# Patient Record
Sex: Female | Born: 1945 | Race: Black or African American | Hispanic: No | Marital: Married | Smoking: Never smoker
Health system: Southern US, Community
[De-identification: ages and names within clinical notes are randomized; demographics above are authoritative.]

## PROBLEM LIST (undated history)

## (undated) DIAGNOSIS — M545 Low back pain, unspecified: Secondary | ICD-10-CM

## (undated) DIAGNOSIS — Z86718 Personal history of other venous thrombosis and embolism: Secondary | ICD-10-CM

## (undated) DIAGNOSIS — E559 Vitamin D deficiency, unspecified: Secondary | ICD-10-CM

## (undated) DIAGNOSIS — T7840XA Allergy, unspecified, initial encounter: Secondary | ICD-10-CM

## (undated) DIAGNOSIS — M5030 Other cervical disc degeneration, unspecified cervical region: Secondary | ICD-10-CM

## (undated) DIAGNOSIS — H269 Unspecified cataract: Secondary | ICD-10-CM

## (undated) DIAGNOSIS — K8020 Calculus of gallbladder without cholecystitis without obstruction: Secondary | ICD-10-CM

## (undated) DIAGNOSIS — E119 Type 2 diabetes mellitus without complications: Secondary | ICD-10-CM

## (undated) DIAGNOSIS — E7800 Pure hypercholesterolemia, unspecified: Secondary | ICD-10-CM

## (undated) DIAGNOSIS — E039 Hypothyroidism, unspecified: Secondary | ICD-10-CM

## (undated) DIAGNOSIS — H209 Unspecified iridocyclitis: Secondary | ICD-10-CM

## (undated) DIAGNOSIS — F411 Generalized anxiety disorder: Secondary | ICD-10-CM

## (undated) DIAGNOSIS — Z860100 Personal history of colon polyps, unspecified: Secondary | ICD-10-CM

## (undated) DIAGNOSIS — Z5189 Encounter for other specified aftercare: Secondary | ICD-10-CM

## (undated) DIAGNOSIS — I10 Essential (primary) hypertension: Secondary | ICD-10-CM

## (undated) DIAGNOSIS — I82409 Acute embolism and thrombosis of unspecified deep veins of unspecified lower extremity: Secondary | ICD-10-CM

## (undated) DIAGNOSIS — D126 Benign neoplasm of colon, unspecified: Secondary | ICD-10-CM

## (undated) DIAGNOSIS — K2100 Gastro-esophageal reflux disease with esophagitis, without bleeding: Secondary | ICD-10-CM

## (undated) DIAGNOSIS — I872 Venous insufficiency (chronic) (peripheral): Secondary | ICD-10-CM

## (undated) DIAGNOSIS — D500 Iron deficiency anemia secondary to blood loss (chronic): Secondary | ICD-10-CM

## (undated) DIAGNOSIS — K589 Irritable bowel syndrome without diarrhea: Secondary | ICD-10-CM

## (undated) DIAGNOSIS — D689 Coagulation defect, unspecified: Secondary | ICD-10-CM

## (undated) DIAGNOSIS — K5904 Chronic idiopathic constipation: Secondary | ICD-10-CM

## (undated) DIAGNOSIS — E538 Deficiency of other specified B group vitamins: Secondary | ICD-10-CM

## (undated) HISTORY — DX: Essential (primary) hypertension: I10

## (undated) HISTORY — DX: Allergy, unspecified, initial encounter: T78.40XA

## (undated) HISTORY — DX: Venous insufficiency (chronic) (peripheral): I87.2

## (undated) HISTORY — DX: Generalized anxiety disorder: F41.1

## (undated) HISTORY — DX: Pure hypercholesterolemia, unspecified: E78.00

## (undated) HISTORY — DX: Benign neoplasm of colon, unspecified: D12.6

## (undated) HISTORY — PX: FEMORAL HERNIA REPAIR: SHX632

## (undated) HISTORY — DX: Irritable bowel syndrome, unspecified: K58.9

## (undated) HISTORY — DX: Vitamin D deficiency, unspecified: E55.9

## (undated) HISTORY — DX: Acute embolism and thrombosis of unspecified deep veins of unspecified lower extremity: I82.409

## (undated) HISTORY — DX: Other cervical disc degeneration, unspecified cervical region: M50.30

## (undated) HISTORY — DX: Hypothyroidism, unspecified: E03.9

## (undated) HISTORY — DX: Unspecified iridocyclitis: H20.9

## (undated) HISTORY — DX: Iron deficiency anemia secondary to blood loss (chronic): D50.0

## (undated) HISTORY — DX: Type 2 diabetes mellitus without complications: E11.9

## (undated) HISTORY — DX: Personal history of colon polyps, unspecified: Z86.0100

## (undated) HISTORY — DX: Chronic idiopathic constipation: K59.04

## (undated) HISTORY — DX: Deficiency of other specified B group vitamins: E53.8

## (undated) HISTORY — DX: Low back pain, unspecified: M54.50

## (undated) HISTORY — DX: Coagulation defect, unspecified: D68.9

## (undated) HISTORY — PX: CARPAL TUNNEL RELEASE: SHX101

## (undated) HISTORY — DX: Unspecified cataract: H26.9

## (undated) HISTORY — DX: Encounter for other specified aftercare: Z51.89

## (undated) HISTORY — DX: Personal history of other venous thrombosis and embolism: Z86.718

## (undated) HISTORY — PX: VESICOVAGINAL FISTULA CLOSURE W/ TAH: SUR271

## (undated) HISTORY — PX: OOPHORECTOMY: SHX86

## (undated) HISTORY — PX: CATARACT EXTRACTION: SUR2

## (undated) HISTORY — DX: Gastro-esophageal reflux disease with esophagitis, without bleeding: K21.00

## (undated) HISTORY — DX: Gastro-esophageal reflux disease with esophagitis: K21.0

## (undated) HISTORY — DX: Low back pain: M54.5

## (undated) HISTORY — DX: Personal history of colonic polyps: Z86.010

## (undated) HISTORY — DX: Calculus of gallbladder without cholecystitis without obstruction: K80.20

---

## 1998-10-21 ENCOUNTER — Ambulatory Visit (HOSPITAL_BASED_OUTPATIENT_CLINIC_OR_DEPARTMENT_OTHER): Admission: RE | Admit: 1998-10-21 | Discharge: 1998-10-21 | Payer: Self-pay | Admitting: Surgery

## 1999-02-25 ENCOUNTER — Encounter: Admission: RE | Admit: 1999-02-25 | Discharge: 1999-02-25 | Payer: Self-pay | Admitting: Sports Medicine

## 1999-03-21 ENCOUNTER — Ambulatory Visit: Admission: RE | Admit: 1999-03-21 | Discharge: 1999-03-21 | Payer: Self-pay | Admitting: Pulmonary Disease

## 1999-03-28 ENCOUNTER — Encounter: Payer: Self-pay | Admitting: Sports Medicine

## 1999-03-28 ENCOUNTER — Ambulatory Visit (HOSPITAL_COMMUNITY): Admission: RE | Admit: 1999-03-28 | Discharge: 1999-03-28 | Payer: Self-pay | Admitting: Sports Medicine

## 1999-05-16 DIAGNOSIS — I82409 Acute embolism and thrombosis of unspecified deep veins of unspecified lower extremity: Secondary | ICD-10-CM

## 1999-05-16 HISTORY — DX: Acute embolism and thrombosis of unspecified deep veins of unspecified lower extremity: I82.409

## 1999-06-27 ENCOUNTER — Other Ambulatory Visit: Admission: RE | Admit: 1999-06-27 | Discharge: 1999-06-27 | Payer: Self-pay | Admitting: *Deleted

## 1999-07-13 ENCOUNTER — Inpatient Hospital Stay (HOSPITAL_COMMUNITY): Admission: EM | Admit: 1999-07-13 | Discharge: 1999-07-19 | Payer: Self-pay | Admitting: Emergency Medicine

## 1999-07-14 ENCOUNTER — Encounter: Payer: Self-pay | Admitting: Pulmonary Disease

## 1999-12-31 ENCOUNTER — Emergency Department (HOSPITAL_COMMUNITY): Admission: EM | Admit: 1999-12-31 | Discharge: 1999-12-31 | Payer: Self-pay | Admitting: Emergency Medicine

## 2000-02-10 ENCOUNTER — Ambulatory Visit: Admission: RE | Admit: 2000-02-10 | Discharge: 2000-02-10 | Payer: Self-pay | Admitting: Pulmonary Disease

## 2000-05-13 ENCOUNTER — Emergency Department (HOSPITAL_COMMUNITY): Admission: EM | Admit: 2000-05-13 | Discharge: 2000-05-14 | Payer: Self-pay | Admitting: Emergency Medicine

## 2000-07-12 ENCOUNTER — Encounter: Payer: Self-pay | Admitting: Pulmonary Disease

## 2000-07-12 ENCOUNTER — Ambulatory Visit (HOSPITAL_COMMUNITY): Admission: RE | Admit: 2000-07-12 | Discharge: 2000-07-12 | Payer: Self-pay | Admitting: Pulmonary Disease

## 2000-09-03 ENCOUNTER — Encounter: Admission: RE | Admit: 2000-09-03 | Discharge: 2000-09-03 | Payer: Self-pay | Admitting: Neurosurgery

## 2000-09-03 ENCOUNTER — Encounter: Payer: Self-pay | Admitting: Neurosurgery

## 2000-09-17 ENCOUNTER — Other Ambulatory Visit: Admission: RE | Admit: 2000-09-17 | Discharge: 2000-09-17 | Payer: Self-pay | Admitting: *Deleted

## 2000-12-17 ENCOUNTER — Emergency Department (HOSPITAL_COMMUNITY): Admission: EM | Admit: 2000-12-17 | Discharge: 2000-12-17 | Payer: Self-pay | Admitting: Emergency Medicine

## 2001-05-13 ENCOUNTER — Ambulatory Visit (HOSPITAL_COMMUNITY): Admission: RE | Admit: 2001-05-13 | Discharge: 2001-05-13 | Payer: Self-pay | Admitting: Pulmonary Disease

## 2001-05-13 ENCOUNTER — Encounter: Payer: Self-pay | Admitting: Pulmonary Disease

## 2001-05-27 ENCOUNTER — Encounter: Payer: Self-pay | Admitting: Pulmonary Disease

## 2001-05-27 ENCOUNTER — Ambulatory Visit (HOSPITAL_COMMUNITY): Admission: RE | Admit: 2001-05-27 | Discharge: 2001-05-27 | Payer: Self-pay | Admitting: Pulmonary Disease

## 2001-09-18 ENCOUNTER — Other Ambulatory Visit: Admission: RE | Admit: 2001-09-18 | Discharge: 2001-09-18 | Payer: Self-pay | Admitting: *Deleted

## 2001-10-31 ENCOUNTER — Encounter: Admission: RE | Admit: 2001-10-31 | Discharge: 2002-01-29 | Payer: Self-pay | Admitting: Pulmonary Disease

## 2002-02-04 ENCOUNTER — Encounter: Payer: Self-pay | Admitting: *Deleted

## 2002-02-10 ENCOUNTER — Encounter: Payer: Self-pay | Admitting: Emergency Medicine

## 2002-02-10 ENCOUNTER — Emergency Department (HOSPITAL_COMMUNITY): Admission: EM | Admit: 2002-02-10 | Discharge: 2002-02-10 | Payer: Self-pay | Admitting: Emergency Medicine

## 2002-02-12 ENCOUNTER — Encounter (INDEPENDENT_AMBULATORY_CARE_PROVIDER_SITE_OTHER): Payer: Self-pay | Admitting: Specialist

## 2002-02-13 ENCOUNTER — Inpatient Hospital Stay (HOSPITAL_COMMUNITY): Admission: RE | Admit: 2002-02-13 | Discharge: 2002-02-18 | Payer: Self-pay | Admitting: *Deleted

## 2002-02-17 ENCOUNTER — Encounter: Payer: Self-pay | Admitting: *Deleted

## 2002-04-02 ENCOUNTER — Encounter: Admission: RE | Admit: 2002-04-02 | Discharge: 2002-07-01 | Payer: Self-pay | Admitting: Pulmonary Disease

## 2002-08-12 ENCOUNTER — Encounter: Admission: RE | Admit: 2002-08-12 | Discharge: 2002-08-12 | Payer: Self-pay | Admitting: Sports Medicine

## 2002-08-12 ENCOUNTER — Encounter: Payer: Self-pay | Admitting: Sports Medicine

## 2002-09-13 DIAGNOSIS — D126 Benign neoplasm of colon, unspecified: Secondary | ICD-10-CM

## 2002-09-13 HISTORY — DX: Benign neoplasm of colon, unspecified: D12.6

## 2002-10-07 ENCOUNTER — Encounter: Admission: RE | Admit: 2002-10-07 | Discharge: 2003-01-05 | Payer: Self-pay | Admitting: Pulmonary Disease

## 2002-10-10 ENCOUNTER — Encounter: Payer: Self-pay | Admitting: Gastroenterology

## 2003-03-23 ENCOUNTER — Other Ambulatory Visit: Admission: RE | Admit: 2003-03-23 | Discharge: 2003-03-23 | Payer: Self-pay | Admitting: *Deleted

## 2003-06-21 ENCOUNTER — Emergency Department (HOSPITAL_COMMUNITY): Admission: EM | Admit: 2003-06-21 | Discharge: 2003-06-21 | Payer: Self-pay | Admitting: Emergency Medicine

## 2003-07-22 ENCOUNTER — Encounter: Admission: RE | Admit: 2003-07-22 | Discharge: 2003-10-20 | Payer: Self-pay | Admitting: Pulmonary Disease

## 2004-03-23 ENCOUNTER — Other Ambulatory Visit: Admission: RE | Admit: 2004-03-23 | Discharge: 2004-03-23 | Payer: Self-pay | Admitting: *Deleted

## 2004-04-01 ENCOUNTER — Ambulatory Visit: Payer: Self-pay | Admitting: Pulmonary Disease

## 2004-04-04 ENCOUNTER — Ambulatory Visit: Payer: Self-pay

## 2004-04-04 ENCOUNTER — Ambulatory Visit: Payer: Self-pay | Admitting: Pulmonary Disease

## 2004-04-04 ENCOUNTER — Ambulatory Visit: Payer: Self-pay | Admitting: Internal Medicine

## 2004-04-25 ENCOUNTER — Ambulatory Visit (HOSPITAL_COMMUNITY): Admission: RE | Admit: 2004-04-25 | Discharge: 2004-04-25 | Payer: Self-pay | Admitting: Ophthalmology

## 2004-05-27 ENCOUNTER — Ambulatory Visit: Payer: Self-pay | Admitting: Pulmonary Disease

## 2004-06-27 ENCOUNTER — Ambulatory Visit: Payer: Self-pay | Admitting: Pulmonary Disease

## 2004-06-30 ENCOUNTER — Encounter: Admission: RE | Admit: 2004-06-30 | Discharge: 2004-09-28 | Payer: Self-pay | Admitting: Pulmonary Disease

## 2004-10-25 ENCOUNTER — Ambulatory Visit: Payer: Self-pay | Admitting: Pulmonary Disease

## 2005-01-25 ENCOUNTER — Ambulatory Visit: Payer: Self-pay | Admitting: Pulmonary Disease

## 2005-05-22 ENCOUNTER — Other Ambulatory Visit: Admission: RE | Admit: 2005-05-22 | Discharge: 2005-05-22 | Payer: Self-pay | Admitting: *Deleted

## 2005-05-29 ENCOUNTER — Ambulatory Visit: Payer: Self-pay | Admitting: Pulmonary Disease

## 2005-08-31 ENCOUNTER — Ambulatory Visit: Payer: Self-pay | Admitting: Pulmonary Disease

## 2005-11-06 ENCOUNTER — Ambulatory Visit: Payer: Self-pay | Admitting: Pulmonary Disease

## 2005-11-09 ENCOUNTER — Ambulatory Visit: Payer: Self-pay

## 2005-11-27 ENCOUNTER — Ambulatory Visit: Payer: Self-pay | Admitting: Pulmonary Disease

## 2005-12-15 ENCOUNTER — Emergency Department (HOSPITAL_COMMUNITY): Admission: EM | Admit: 2005-12-15 | Discharge: 2005-12-15 | Payer: Self-pay | Admitting: Emergency Medicine

## 2005-12-19 ENCOUNTER — Ambulatory Visit: Payer: Self-pay | Admitting: Pulmonary Disease

## 2006-01-19 ENCOUNTER — Ambulatory Visit: Payer: Self-pay

## 2006-01-20 ENCOUNTER — Encounter: Admission: RE | Admit: 2006-01-20 | Discharge: 2006-01-20 | Payer: Self-pay | Admitting: Sports Medicine

## 2006-04-02 ENCOUNTER — Ambulatory Visit: Payer: Self-pay | Admitting: Pulmonary Disease

## 2006-08-07 ENCOUNTER — Encounter: Admission: RE | Admit: 2006-08-07 | Discharge: 2006-08-07 | Payer: Self-pay | Admitting: Neurosurgery

## 2006-10-01 ENCOUNTER — Ambulatory Visit: Payer: Self-pay | Admitting: Pulmonary Disease

## 2006-10-02 LAB — CONVERTED CEMR LAB
ALT: 25 units/L (ref 0–40)
AST: 25 units/L (ref 0–37)
Albumin: 4 g/dL (ref 3.5–5.2)
Alkaline Phosphatase: 81 units/L (ref 39–117)
BUN: 17 mg/dL (ref 6–23)
Basophils Absolute: 0.1 10*3/uL (ref 0.0–0.1)
Basophils Relative: 1.1 % — ABNORMAL HIGH (ref 0.0–1.0)
Bilirubin, Direct: 0.1 mg/dL (ref 0.0–0.3)
CO2: 28 meq/L (ref 19–32)
Calcium: 9.5 mg/dL (ref 8.4–10.5)
Chloride: 107 meq/L (ref 96–112)
Cholesterol: 217 mg/dL (ref 0–200)
Creatinine, Ser: 1.1 mg/dL (ref 0.4–1.2)
Direct LDL: 94.5 mg/dL
Eosinophils Absolute: 0.2 10*3/uL (ref 0.0–0.6)
Eosinophils Relative: 3.4 % (ref 0.0–5.0)
GFR calc Af Amer: 65 mL/min
GFR calc non Af Amer: 54 mL/min
Glucose, Bld: 146 mg/dL — ABNORMAL HIGH (ref 70–99)
HCT: 42.8 % (ref 36.0–46.0)
HDL: 45.3 mg/dL (ref >39.0)
Hemoglobin: 14.6 g/dL (ref 12.0–15.0)
Hgb A1c MFr Bld: 6 % (ref 4.6–6.0)
Lymphocytes Relative: 30.7 % (ref 12.0–46.0)
MCHC: 34.2 g/dL (ref 30.0–36.0)
MCV: 87.7 fL (ref 78.0–100.0)
Monocytes Absolute: 0.4 10*3/uL (ref 0.2–0.7)
Monocytes Relative: 6 % (ref 3.0–11.0)
Neutro Abs: 3.7 10*3/uL (ref 1.4–7.7)
Neutrophils Relative %: 58.8 % (ref 43.0–77.0)
Platelets: 258 10*3/uL (ref 150–400)
Potassium: 4.4 meq/L (ref 3.5–5.1)
RBC: 4.88 M/uL (ref 3.87–5.11)
RDW: 13.7 % (ref 11.5–14.6)
Sodium: 141 meq/L (ref 135–145)
TSH: 1.09 microintl units/mL (ref 0.35–5.50)
Total Bilirubin: 0.6 mg/dL (ref 0.3–1.2)
Total CHOL/HDL Ratio: 4.8
Total Protein: 7.2 g/dL (ref 6.0–8.3)
Triglycerides: 181 mg/dL — ABNORMAL HIGH (ref 0–149)
VLDL: 36 mg/dL (ref 0–40)
WBC: 6.3 10*3/uL (ref 4.5–10.5)

## 2006-11-05 ENCOUNTER — Ambulatory Visit: Payer: Self-pay | Admitting: Internal Medicine

## 2006-12-05 ENCOUNTER — Other Ambulatory Visit: Admission: RE | Admit: 2006-12-05 | Discharge: 2006-12-05 | Payer: Self-pay | Admitting: *Deleted

## 2007-01-11 ENCOUNTER — Ambulatory Visit: Payer: Self-pay | Admitting: Pulmonary Disease

## 2007-01-11 LAB — CONVERTED CEMR LAB
BUN: 10 mg/dL (ref 6–23)
CO2: 30 meq/L (ref 19–32)
Calcium: 9.5 mg/dL (ref 8.4–10.5)
Chloride: 104 meq/L (ref 96–112)
Creatinine, Ser: 1 mg/dL (ref 0.4–1.2)
GFR calc Af Amer: 72 mL/min
GFR calc non Af Amer: 60 mL/min
Glucose, Bld: 134 mg/dL — ABNORMAL HIGH (ref 70–99)
Hgb A1c MFr Bld: 5.6 % (ref 4.6–6.0)
Potassium: 5 meq/L (ref 3.5–5.1)
Sodium: 142 meq/L (ref 135–145)

## 2007-04-03 ENCOUNTER — Ambulatory Visit: Payer: Self-pay | Admitting: Pulmonary Disease

## 2007-04-03 DIAGNOSIS — L988 Other specified disorders of the skin and subcutaneous tissue: Secondary | ICD-10-CM | POA: Insufficient documentation

## 2007-04-03 DIAGNOSIS — N390 Urinary tract infection, site not specified: Secondary | ICD-10-CM | POA: Insufficient documentation

## 2007-04-03 DIAGNOSIS — K581 Irritable bowel syndrome with constipation: Secondary | ICD-10-CM

## 2007-04-03 DIAGNOSIS — G5600 Carpal tunnel syndrome, unspecified upper limb: Secondary | ICD-10-CM | POA: Insufficient documentation

## 2007-04-03 DIAGNOSIS — I10 Essential (primary) hypertension: Secondary | ICD-10-CM | POA: Insufficient documentation

## 2007-04-03 DIAGNOSIS — E039 Hypothyroidism, unspecified: Secondary | ICD-10-CM | POA: Insufficient documentation

## 2007-04-03 DIAGNOSIS — M5030 Other cervical disc degeneration, unspecified cervical region: Secondary | ICD-10-CM | POA: Insufficient documentation

## 2007-04-03 DIAGNOSIS — F40298 Other specified phobia: Secondary | ICD-10-CM | POA: Insufficient documentation

## 2007-04-03 DIAGNOSIS — K2100 Gastro-esophageal reflux disease with esophagitis, without bleeding: Secondary | ICD-10-CM | POA: Insufficient documentation

## 2007-04-03 DIAGNOSIS — I872 Venous insufficiency (chronic) (peripheral): Secondary | ICD-10-CM | POA: Insufficient documentation

## 2007-04-03 DIAGNOSIS — R209 Unspecified disturbances of skin sensation: Secondary | ICD-10-CM | POA: Insufficient documentation

## 2007-04-03 DIAGNOSIS — F411 Generalized anxiety disorder: Secondary | ICD-10-CM | POA: Insufficient documentation

## 2007-04-03 DIAGNOSIS — M545 Low back pain, unspecified: Secondary | ICD-10-CM | POA: Insufficient documentation

## 2007-04-03 DIAGNOSIS — H209 Unspecified iridocyclitis: Secondary | ICD-10-CM | POA: Insufficient documentation

## 2007-04-03 DIAGNOSIS — D500 Iron deficiency anemia secondary to blood loss (chronic): Secondary | ICD-10-CM | POA: Insufficient documentation

## 2007-04-03 DIAGNOSIS — K5900 Constipation, unspecified: Secondary | ICD-10-CM | POA: Insufficient documentation

## 2007-04-03 DIAGNOSIS — G932 Benign intracranial hypertension: Secondary | ICD-10-CM | POA: Insufficient documentation

## 2007-04-03 DIAGNOSIS — E538 Deficiency of other specified B group vitamins: Secondary | ICD-10-CM | POA: Insufficient documentation

## 2007-04-03 DIAGNOSIS — Z86718 Personal history of other venous thrombosis and embolism: Secondary | ICD-10-CM | POA: Insufficient documentation

## 2007-04-08 ENCOUNTER — Telehealth (INDEPENDENT_AMBULATORY_CARE_PROVIDER_SITE_OTHER): Payer: Self-pay | Admitting: *Deleted

## 2007-04-17 ENCOUNTER — Encounter: Payer: Self-pay | Admitting: Pulmonary Disease

## 2007-07-04 ENCOUNTER — Ambulatory Visit: Payer: Self-pay | Admitting: Pulmonary Disease

## 2007-07-06 LAB — CONVERTED CEMR LAB
ALT: 32 units/L (ref 0–35)
AST: 32 units/L (ref 0–37)
Albumin: 3.9 g/dL (ref 3.5–5.2)
Alkaline Phosphatase: 91 units/L (ref 39–117)
BUN: 16 mg/dL (ref 6–23)
Basophils Absolute: 0 10*3/uL (ref 0.0–0.1)
Basophils Relative: 0.7 % (ref 0.0–1.0)
Bilirubin, Direct: 0.1 mg/dL (ref 0.0–0.3)
CO2: 28 meq/L (ref 19–32)
Calcium: 9.5 mg/dL (ref 8.4–10.5)
Chloride: 99 meq/L (ref 96–112)
Cholesterol: 231 mg/dL (ref 0–200)
Creatinine, Ser: 1.1 mg/dL (ref 0.4–1.2)
Direct LDL: 105.9 mg/dL
Eosinophils Absolute: 0.2 10*3/uL (ref 0.0–0.6)
Eosinophils Relative: 4.1 % (ref 0.0–5.0)
GFR calc Af Amer: 65 mL/min
GFR calc non Af Amer: 53 mL/min
Glucose, Bld: 203 mg/dL — ABNORMAL HIGH (ref 70–99)
HCT: 43.3 % (ref 36.0–46.0)
HDL: 46.8 mg/dL (ref >39.0)
Hemoglobin: 14.5 g/dL (ref 12.0–15.0)
Hgb A1c MFr Bld: 6.9 % — ABNORMAL HIGH (ref 4.6–6.0)
Iron: 77 ug/dL (ref 42–145)
Lymphocytes Relative: 34.6 % (ref 12.0–46.0)
MCHC: 33.6 g/dL (ref 30.0–36.0)
MCV: 86.9 fL (ref 78.0–100.0)
Monocytes Absolute: 0.6 10*3/uL (ref 0.2–0.7)
Monocytes Relative: 10.6 % (ref 3.0–11.0)
Neutro Abs: 2.7 10*3/uL (ref 1.4–7.7)
Neutrophils Relative %: 50 % (ref 43.0–77.0)
Platelets: 252 10*3/uL (ref 150–400)
Potassium: 5.1 meq/L (ref 3.5–5.1)
RBC: 4.98 M/uL (ref 3.87–5.11)
RDW: 13.3 % (ref 11.5–14.6)
Sodium: 138 meq/L (ref 135–145)
TSH: 1.9 microintl units/mL (ref 0.35–5.50)
Total Bilirubin: 0.9 mg/dL (ref 0.3–1.2)
Total CHOL/HDL Ratio: 4.9
Total Protein: 7.3 g/dL (ref 6.0–8.3)
Transferrin: 273.7 mg/dL (ref >=212.0)
Triglycerides: 138 mg/dL (ref 0–149)
VLDL: 28 mg/dL (ref 0–40)
Vit D, 1,25-Dihydroxy: 14 — ABNORMAL LOW (ref 30–89)
Vitamin B-12: 599 pg/mL (ref 211–911)
WBC: 5.4 10*3/uL (ref 4.5–10.5)

## 2007-07-08 ENCOUNTER — Encounter (INDEPENDENT_AMBULATORY_CARE_PROVIDER_SITE_OTHER): Payer: Self-pay | Admitting: *Deleted

## 2007-07-09 ENCOUNTER — Telehealth (INDEPENDENT_AMBULATORY_CARE_PROVIDER_SITE_OTHER): Payer: Self-pay | Admitting: *Deleted

## 2007-07-09 ENCOUNTER — Ambulatory Visit: Payer: Self-pay | Admitting: Internal Medicine

## 2007-07-26 ENCOUNTER — Encounter: Payer: Self-pay | Admitting: Pulmonary Disease

## 2007-08-15 ENCOUNTER — Telehealth (INDEPENDENT_AMBULATORY_CARE_PROVIDER_SITE_OTHER): Payer: Self-pay | Admitting: *Deleted

## 2007-10-24 ENCOUNTER — Encounter: Payer: Self-pay | Admitting: Pulmonary Disease

## 2007-11-04 ENCOUNTER — Ambulatory Visit: Payer: Self-pay | Admitting: Pulmonary Disease

## 2007-11-16 DIAGNOSIS — E785 Hyperlipidemia, unspecified: Secondary | ICD-10-CM | POA: Insufficient documentation

## 2007-12-30 ENCOUNTER — Encounter: Payer: Self-pay | Admitting: Pulmonary Disease

## 2008-01-15 ENCOUNTER — Encounter: Payer: Self-pay | Admitting: Pulmonary Disease

## 2008-03-05 ENCOUNTER — Ambulatory Visit: Payer: Self-pay | Admitting: Pulmonary Disease

## 2008-03-05 DIAGNOSIS — E559 Vitamin D deficiency, unspecified: Secondary | ICD-10-CM | POA: Insufficient documentation

## 2008-03-05 LAB — CONVERTED CEMR LAB: Vit D, 1,25-Dihydroxy: 65 (ref 30–89)

## 2008-03-07 LAB — CONVERTED CEMR LAB
BUN: 16 mg/dL (ref 6–23)
CO2: 31 meq/L (ref 19–32)
Calcium: 9.9 mg/dL (ref 8.4–10.5)
Chloride: 104 meq/L (ref 96–112)
Creatinine, Ser: 1.1 mg/dL (ref 0.4–1.2)
GFR calc Af Amer: 65 mL/min
GFR calc non Af Amer: 53 mL/min
Glucose, Bld: 97 mg/dL (ref 70–99)
Potassium: 4.9 meq/L (ref 3.5–5.1)
Sodium: 143 meq/L (ref 135–145)
Uric Acid, Serum: 7.4 mg/dL — ABNORMAL HIGH (ref 2.4–7.0)

## 2008-04-02 DIAGNOSIS — Z860100 Personal history of colon polyps, unspecified: Secondary | ICD-10-CM | POA: Insufficient documentation

## 2008-04-06 ENCOUNTER — Telehealth: Payer: Self-pay | Admitting: Pulmonary Disease

## 2008-04-06 ENCOUNTER — Ambulatory Visit: Payer: Self-pay | Admitting: Gastroenterology

## 2008-04-13 ENCOUNTER — Encounter: Payer: Self-pay | Admitting: Pulmonary Disease

## 2008-05-13 ENCOUNTER — Ambulatory Visit: Payer: Self-pay | Admitting: Gastroenterology

## 2008-05-20 ENCOUNTER — Ambulatory Visit: Payer: Self-pay | Admitting: Gastroenterology

## 2008-05-20 ENCOUNTER — Encounter: Payer: Self-pay | Admitting: Gastroenterology

## 2008-05-21 ENCOUNTER — Encounter: Payer: Self-pay | Admitting: Gastroenterology

## 2008-07-09 ENCOUNTER — Encounter: Payer: Self-pay | Admitting: Pulmonary Disease

## 2008-09-01 ENCOUNTER — Ambulatory Visit: Payer: Self-pay | Admitting: Pulmonary Disease

## 2008-09-04 ENCOUNTER — Telehealth (INDEPENDENT_AMBULATORY_CARE_PROVIDER_SITE_OTHER): Payer: Self-pay | Admitting: *Deleted

## 2008-09-10 ENCOUNTER — Encounter: Payer: Self-pay | Admitting: Pulmonary Disease

## 2008-11-19 ENCOUNTER — Encounter: Payer: Self-pay | Admitting: Pulmonary Disease

## 2009-01-14 ENCOUNTER — Encounter: Payer: Self-pay | Admitting: Pulmonary Disease

## 2009-02-16 ENCOUNTER — Encounter: Payer: Self-pay | Admitting: Pulmonary Disease

## 2009-02-23 ENCOUNTER — Encounter: Payer: Self-pay | Admitting: Pulmonary Disease

## 2009-03-01 ENCOUNTER — Ambulatory Visit: Payer: Self-pay | Admitting: Pulmonary Disease

## 2009-04-16 ENCOUNTER — Telehealth: Payer: Self-pay | Admitting: Pulmonary Disease

## 2009-06-23 ENCOUNTER — Encounter: Payer: Self-pay | Admitting: Pulmonary Disease

## 2009-07-19 ENCOUNTER — Encounter: Payer: Self-pay | Admitting: Pulmonary Disease

## 2009-08-03 ENCOUNTER — Encounter: Payer: Self-pay | Admitting: Pulmonary Disease

## 2009-10-05 ENCOUNTER — Encounter: Payer: Self-pay | Admitting: Pulmonary Disease

## 2009-10-06 ENCOUNTER — Telehealth: Payer: Self-pay | Admitting: Pulmonary Disease

## 2009-10-07 ENCOUNTER — Telehealth: Payer: Self-pay | Admitting: Pulmonary Disease

## 2009-10-07 ENCOUNTER — Telehealth (INDEPENDENT_AMBULATORY_CARE_PROVIDER_SITE_OTHER): Payer: Self-pay | Admitting: *Deleted

## 2009-10-07 ENCOUNTER — Encounter: Admission: RE | Admit: 2009-10-07 | Discharge: 2009-10-07 | Payer: Self-pay | Admitting: Pulmonary Disease

## 2009-10-12 ENCOUNTER — Ambulatory Visit: Payer: Self-pay | Admitting: Pulmonary Disease

## 2009-12-31 ENCOUNTER — Telehealth (INDEPENDENT_AMBULATORY_CARE_PROVIDER_SITE_OTHER): Payer: Self-pay | Admitting: *Deleted

## 2010-01-06 ENCOUNTER — Encounter: Payer: Self-pay | Admitting: Pulmonary Disease

## 2010-02-07 ENCOUNTER — Encounter: Payer: Self-pay | Admitting: Pulmonary Disease

## 2010-02-17 ENCOUNTER — Encounter: Payer: Self-pay | Admitting: Pulmonary Disease

## 2010-02-23 ENCOUNTER — Encounter: Payer: Self-pay | Admitting: Pulmonary Disease

## 2010-04-04 ENCOUNTER — Telehealth (INDEPENDENT_AMBULATORY_CARE_PROVIDER_SITE_OTHER): Payer: Self-pay | Admitting: *Deleted

## 2010-04-11 ENCOUNTER — Ambulatory Visit: Payer: Self-pay | Admitting: Pulmonary Disease

## 2010-04-28 ENCOUNTER — Telehealth: Payer: Self-pay | Admitting: Pulmonary Disease

## 2010-05-03 ENCOUNTER — Telehealth (INDEPENDENT_AMBULATORY_CARE_PROVIDER_SITE_OTHER): Payer: Self-pay | Admitting: *Deleted

## 2010-05-11 ENCOUNTER — Encounter: Payer: Self-pay | Admitting: Pulmonary Disease

## 2010-06-16 NOTE — Consult Note (Signed)
Summary: [facility] Endocrinology & Diabetes  [facility] Endocrinology & Diabetes   Imported By: Laural Benes 01/28/2008 15:05:00  _____________________________________________________________________  External Attachment:    Type:   Image     Comment:   External Document

## 2010-06-16 NOTE — Letter (Signed)
Summary: [facility]  [facility]   Imported By: Phillis Knack 02/02/2009 07:40:42  _____________________________________________________________________  External Attachment:    Type:   Image     Comment:   External Document

## 2010-06-16 NOTE — Progress Notes (Signed)
Summary: alprazolam rx  Phone Note Call from Patient   Caller: Patient Call For: nadel Summary of Call: [facility] [address] [facility]" to keep her refill rx for alprazolam on file but says they don't have this. pt cell [phone] Initial call taken by: Cooper Render, CNA,  May 03, 2010 11:19 AM  Follow-up for Phone Call        Spoke with pt and confirmed msg.  She is needing refill on alprazolam, pls advise if this is okay, thanks Tilden Dome  May 03, 2010 2:01 PM  Follow-up by: Tilden Dome,  May 03, 2010 2:01 PM  Additional Follow-up for Phone Call Additional follow up Details #1::        per SN----ok for pt to have alprazolam 0.5 mg   #90   take 1/2 to 1 tablet by mouth three times a day as needed for nerves refills x 5.  thanks [place]  May 03, 2010 2:44 PM   [facility] [address] -- pt aware Additional Follow-up by: Raymondo Band RN,  May 03, 2010 3:04 PM    Prescriptions: ALPRAZOLAM 0.5 MG  TABS (ALPRAZOLAM) take 1/2 to 1 tab by mouth three times a day as needed for nerves...  #90 x 5   Entered by:   Raymondo Band RN   Authorized by:   Noralee Space MD   Signed by:   Raymondo Band RN on 05/03/2010   Method used:   Telephoned to ...       [facility]  [address] [id number]ail[facility]       [place]       [place], [place]  [address]       Ph: [phone] or [phone]       Fax: [phone]   RxID:   [id number]

## 2010-06-16 NOTE — Letter (Signed)
Summary: [facility] Endocrinology & Diabetes  [facility] Endocrinology [facility] Diabetes   Imported By: Phillis Knack 11/01/2009 14:03:34  _____________________________________________________________________  External Attachment:    Type:   Image     Comment:   External Document

## 2010-06-16 NOTE — Progress Notes (Signed)
Summary: MED ?  Phone Note Call from Patient   Caller: Patient Call For: nadel Summary of Call: DR ALTHEIMER WOULD LIKE TO KNOW  WHY SHE NEED AMLODIPINE AND DILTIAZEM ER TOGETHER HIS NUMBER IS [phone] Initial call taken by: Gustavus Bryant,  April 06, 2008 2:37 PM  Follow-up for Phone Call        called and spoke with pt...per SN---at her last ov they discussed this and she was to start weaning herself down off of the diltiazem and she was to continue with the amlodipine.  she will stop the diltiazem and continue with the amlodipine. Ramiro Harvest CMA  April 06, 2008 5:48 PM

## 2010-06-16 NOTE — Letter (Signed)
Summary: [facility] Endocrinology & Diabetes  [facility] Endocrinology & Diabetes   Imported By: Phillis Knack 07/03/2009 08:43:38  _____________________________________________________________________  External Attachment:    Type:   Image     Comment:   External Document

## 2010-06-16 NOTE — Miscellaneous (Signed)
Summary: BONE DENSITY  Clinical Lists Changes  Orders: Added new Test order of T-Lumbar Vertebral Assessment (77082) - Signed 

## 2010-06-16 NOTE — Assessment & Plan Note (Signed)
Summary: FOLLOW UP/ MBW   Chief Complaint:  3 month ROV....  History of Present Illness: 65 y/o BF here for a follow up visit... she has a problem list w/ 21 problems listed- she doesn't feel well and states "I'm a worrier"... ie concerned about her DM, her weight, & her abd panniculus...   Current Problems:  ***ESSENTIAL HYPERTENSION - controlled on CARDIZEM 180mg /d, NORVASC 10mg /d, LISINOPRIL 20mg /d, & LASIX 20mg /d... not checking BP's at home [facility], and BP=118/68 today... she denies HA, fatigue, visual changes, CP, palipit, dizziness, syncope, dyspnea, edema, etc...  Hx of DEEP VENOUS THROMBOPHLEBITIS, VENOUS INSUFFICIENCY  ***DIABETES MELLITUS, TYPE II (ICD-250.00) - on METFORMIN ER 500mg /d and she notes BS's sl increased at home... ?on diet and weight is down 6# to 216# today... not exercising.& we discussed this... prev BS=134, HgA1c=5.6 (8/08 on Glucovance 250/1.25 qAM)... due for f/u labs today.  ***HYPOTHYROIDISM (ICD-244.9) - on LEVOTHYROID 75mcg/d... TSH 7/08 norm at 2.5.Marland KitchenMarland Kitchen  REFLUX ESOPHAGITIS, IRRITABLE BOWEL SYNDROME, COLONIC POLYPS, ADENOMATOUS  Hx of IRON DEFICIENCY ANEMIA SECONDARY TO BLOOD LOSS, VITAMIN B12 DEFICIENCY   UTI'S, RECURRENT (ICD-599.0)  DEGENERATIVE DISC DISEASE, CERVICAL SPINE; LOW BACK PAIN, CHRONIC; HYPERURICEMIA;  Hx of CARPAL TUNNEL SYNDROME, BILATERAL  Hx of PSEUDOTUMOR CEREBRI & PARESTHESIA   ANXIETY, OTHER ISOLATED OR SPECIFIC PHOBIAS  OTHER SPECIFIED DISORDER OF SKIN (ICD-709.8)  IRITIS (ICD-364.3)       Current Allergies (reviewed today): ! SULFAMETHOXAZOLE-TRIMETHOPRIM (SULFAMETHOXAZOLE-TRIMETHOPRIM) ! PENICILLIN V POTASSIUM (PENICILLIN V POTASSIUM) ! TOPROL XL (METOPROLOL SUCCINATE)  Past Medical History:         1. ESSENTIAL HYPERTENSION - Baseline CXR w/ nl heart & clear lungs;  EKG shows NSR & NSSTTWA;  2D ECHO 3/01 was nl;  well controlled on meds.       2. Hx of DEEP VENOUS THROMBOPHLEBITIS - Hx DVT &  small PE 2/01 - Rx w/ Coumadin til 2006, then dc'd;  Neg venous dopplers 11/05 & 6/07.       3. VENOUS INSUFFICIENCY              4. DIABETES MELLITUS, TYPE II - Good control on Metformin ER 500 (one daily) w/ BS 134 & HgA1C 5.6 (8/08).       5. HYPOTHYROIDISM              6. REFLUX ESOPHAGITIS        7. IRRITABLE BOWEL SYNDROME        8. COLONIC POLYPS, ADENOMATOUS - Last colonoscopy 5/04 by DrStark - adenomatous polyp removed, +hem, f/u 5 yrs.       9. Hx of IRON DEFICIENCY ANEMIA SECONDARY TO BLOOD LOSS        10. VITAMIN B12 DEFICIENCY        11. UTI'S, RECURRENT               12. DEGENERATIVE DISC DISEASE, CERVICAL SPINE - Eval & Rx by [facility] 3/02 w/ multilevel DDD - C4-5 C5-6 C6-7;  conservative Rx & improved.       13. LOW BACK PAIN, CHRONIC - Eval & Rx by DrKramer & Joie Bimler 9/07 - L2 compression & pelvic fx after fall; subseq Rx w/ shots for Lumbar spondy & improved.       14. HYPERURICEMIA       15. Hx of CARPAL TUNNEL SYNDROME, BILATERAL - Eval & Rx by [facility] & DrSypher in 2001 w/ wrist splints & improved.  16. Hx of PSEUDOTUMOR CEREBRI - Eval & Rx by DrBrewington/Tyson & DrLove 1984 - treated w/ Diamox (headaches resolved).       17. PARESTHESIA -  mult somatic complaints - Rx attempt w/ Cymbalta (no change).       18. ANXIETY        19. OTHER ISOLATED OR SPECIFIC PHOBIAS - She has a "pill phobia" & unable to swallow some pills.       20. OTHER SPECIFIED DISORDER OF SKIN - val DrDrewJones w/ "lichenoid dermatitis" on skin Bx 5/06 - poss related to Toprol Rx.       21. IRITIS - Eval DrTyson 12/05 was neg.     Review of Systems  The patient denies anorexia, fever, weight loss, weight gain, vision loss, decreased hearing, hoarseness, chest pain, syncope, peripheral edema, prolonged cough, hemoptysis, abdominal pain, melena, hematochezia, severe indigestion/heartburn, hematuria, incontinence, muscle weakness, suspicious skin lesions, transient blindness,  difficulty walking, depression, unusual weight change, abnormal bleeding, enlarged lymph nodes, and angioedema.     Vital Signs:  Patient Profile:   65 Years Old Female Weight:      216.2 pounds O2 Sat:      94 % O2 treatment:    Room Air Temp:     97.2 degrees F oral Pulse rate:   111 / minute BP sitting:   118 / 68  (left arm)  Vitals Entered By: Ramiro Harvest CMA (July 04, 2007 9:37 AM)                 Physical Exam  WD, WN, 65 y/o BF in NAD... GENERAL:  Alert & oriented; pleasant & cooperative. HEENT:  [place]/AT, EOM-wnl, PERRLA, EACs-clear, TMs-wnl, NOSE-clear, THROAT-clear & wnl. NECK:  Supple w/ fair ROM; no JVD; normal carotid impulses w/o bruits; no thyromegaly or nodules palpated; no lymphadenopathy. CHEST:  Clear to P & A; without wheezes/ rales/ or rhonchi. HEART:  Regular Rhythm; gr 1/6 SEM without rubs or gallops heard... ABDOMEN:  Obese, soft & nontender; + abd panniculus, normal bowel sounds; no organomegaly or masses detected. EXT: without deformities, mild arthritic changes; no varicose veins/ +venous insuffic/ no edema. NEURO:  CN's intact; no focal neuro deficits... DERM:  No lesions noted; no rash etc...       Impression & Recommendations:  Problem # 1:  ESSENTIAL HYPERTENSION (ICD-401.9) Assessment: Unchanged BP stable... continue same meds... due for follow up labs--- Her updated medication list for this problem includes:    Cardizem Cd 180 Mg Cp24 (Diltiazem hcl coated beads) .Marland Kitchen... 1 tab daily    Amlodipine Besylate 10 Mg Tabs (Amlodipine besylate) .Marland Kitchen... 1 tab daily    Lisinopril 20 Mg Tabs (Lisinopril) .Marland Kitchen... 1 tab daily    Furosemide 20 Mg Tabs (Furosemide) .Marland Kitchen... 1 tab in am  Orders: Venipuncture [id number]) T-Vitamin D (25-Hydroxy) [phone]) 12 Lead EKG (12 Lead EKG) TLB-Lipid Panel (80061-LIPID) TLB-BMP (Basic Metabolic Panel-BMET) (99991111) TLB-CBC Platelet - w/Differential (85025-CBCD) TLB-Hepatic/Liver Function Pnl  (80076-HEPATIC) TLB-TSH (Thyroid Stimulating Hormone) (84443-TSH) TLB-A1C / Hgb A1C (Glycohemoglobin) (83036-A1C) TLB-Iron, (Fe) Total (83540-FE) TLB-Transferrin (84466-TRNSF) TLB-B12, Serum-Total ONLY [id number])   Problem # 2:  DIABETES MELLITUS, TYPE II (ICD-250.00) Assessment: Unchanged Needs better diet and exercise program... continue same meds... check labs--- Her updated medication list for this problem includes:    Ecotrin Low Strength 81 Mg Tbec (Aspirin) .Marland Kitchen... 1 tab daily    Lisinopril 20 Mg Tabs (Lisinopril) .Marland Kitchen... 1 tab daily    Glucophage Xr 500 Mg Tb24 (Metformin hcl) .Marland KitchenMarland KitchenMarland KitchenMarland Kitchen  1 tab in am (generic)  Problem # 3:  HYPOTHYROIDISM (ICD-244.9) Assessment: Unchanged Continue SYNTHROID... follow up lab--- Her updated medication list for this problem includes:    Levothyroxine Sodium 75 Mcg Tabs (Levothyroxine sodium) .Marland Kitchen... 1 tab daily  Problem # 4:  REFLUX ESOPHAGITIS (ICD-530.11) Assessment: Unchanged GI stable... continue same meds.  Problem # 5:  Hx of IRON DEFICIENCY ANEMIA SECONDARY TO BLOOD LOSS (ICD-280.0) Assessment: Unchanged Follow up anemia labs--- Her updated medication list for this problem includes:    Cyanocobalamin 1000 Mcg/ml Inj Soln (Cyanocobalamin) .Marland KitchenMarland KitchenMarland KitchenMarland Kitchen 1000 micrograms subcutaneously qmonth  Problem # 6:  ANXIETY (ICD-300.00) Assessment: Unchanged We discussed her anxiety and fears... continue alprazolam Rx. Her updated medication list for this problem includes:    Alprazolam 0.5 Mg Tabs (Alprazolam) .Marland Kitchen... 1 tab three times a day prn   Complete Medication List: 1)  Ecotrin Low Strength 81 Mg Tbec (Aspirin) .Marland Kitchen.. 1 tab daily 2)  Cardizem Cd 180 Mg Cp24 (Diltiazem hcl coated beads) .Marland Kitchen.. 1 tab daily 3)  Amlodipine Besylate 10 Mg Tabs (Amlodipine besylate) .Marland Kitchen.. 1 tab daily 4)  Lisinopril 20 Mg Tabs (Lisinopril) .Marland Kitchen.. 1 tab daily 5)  Furosemide 20 Mg Tabs (Furosemide) .Marland Kitchen.. 1 tab in am 6)  Glucophage Xr 500 Mg Tb24 (Metformin hcl) .Marland Kitchen.. 1 tab in am  (generic) 7)  Levothyroxine Sodium 75 Mcg Tabs (Levothyroxine sodium) .Marland Kitchen.. 1 tab daily 8)  Allopurinol 300 Mg Tabs (Allopurinol) .Marland Kitchen.. 1 tab daily 9)  Alprazolam 0.5 Mg Tabs (Alprazolam) .Marland Kitchen.. 1 tab three times a day prn 10)  Cyanocobalamin 1000 Mcg/ml Inj Soln (Cyanocobalamin) .Marland Kitchen.. 1000 micrograms subcutaneously qmonth   Patient Instructions: 1)  Today we rechecked your fasting blood work... please call the "phone tree" in a few days for your lab results.Marland KitchenMarland Kitchen 2)  Call for any problems.Marland KitchenMarland Kitchen 3)  Please schedule a follow-up appointment in 4-6 months.    ]

## 2010-06-16 NOTE — Assessment & Plan Note (Signed)
Summary: consult b4 col ins dep diab.Marland Kitchenem   History of Present Illness Visit Type: consult Primary GI MD: Joylene Igo MD [facility] Primary Maurita Havener: Teressa Lower MD Requesting Calvin Chura: Teressa Lower MD Chief Complaint: h/o colon polyps, consult before colon, diabetic  History of Present Illness:   This is a 65 year old female that I have seen previously for adenomatous colon polyps. Initial diagnosis of adenomatous colon polyps was made in May 2004. He has no colorectal complaints except for occasional mild constipation. She has stable diabetes mellitus hypertension and hyperlipidemia managed by Dr. Lenna Gilford.   GI Review of Systems      Denies abdominal pain, acid reflux, belching, bloating, chest pain, dysphagia with liquids, dysphagia with solids, heartburn, loss of appetite, nausea, vomiting, vomiting blood, weight loss, and  weight gain.      Reports constipation.     Denies anal fissure, black tarry stools, change in bowel habit, diarrhea, diverticulosis, fecal incontinence, heme positive stool, hemorrhoids, irritable bowel syndrome, jaundice, light color stool, liver problems, rectal bleeding, and  rectal pain.    Prior Medications Reviewed Using: List Brought by Patient  Updated Prior Medication List: ECOTRIN LOW STRENGTH 81 MG  TBEC (ASPIRIN) 1 tab daily CARDIZEM CD 180 MG  CP24 (DILTIAZEM HCL COATED BEADS) 1 tab daily AMLODIPINE BESYLATE 10 MG  TABS (AMLODIPINE BESYLATE) 1 tab daily LISINOPRIL 20 MG  TABS (LISINOPRIL) 1 tab daily FUROSEMIDE 20 MG  TABS (FUROSEMIDE) 1 tab in AM CRESTOR 20 MG TABS (ROSUVASTATIN CALCIUM) take 1 tab by mouth once daily... METFORMIN HCL 500 MG  TABS (METFORMIN HCL) take 1 tab by mouth two times a day BYETTA 10 MCG PEN 10 MCG/0.04ML SOLN (EXENATIDE) inject 43mcg two times a day as directed... SYNTHROID 88 MCG  TABS (LEVOTHYROXINE SODIUM) Take 1 tablet by mouth once a day ALLOPURINOL 300 MG  TABS (ALLOPURINOL) 1 tab daily ALPRAZOLAM 0.5 MG  TABS  (ALPRAZOLAM) 1 tab three times a day prn CYANOCOBALAMIN 1000 MCG/ML INJ SOLN (CYANOCOBALAMIN) 1000 micrograms subcutaneously qmonth VITAMIN D 13086 UNIT  CAPS (ERGOCALCIFEROL) 1 by mouth every week  Current Allergies (reviewed today): ! PENICILLIN V POTASSIUM (PENICILLIN V POTASSIUM) ! TOPROL XL (METOPROLOL SUCCINATE)  Past Medical History:    ESSENTIAL HYPERTENSION (ICD-401.9)    Hx of DEEP VENOUS THROMBOPHLEBITIS (ICD-453.40)    VENOUS INSUFFICIENCY (ICD-459.81)    HYPERCHOLESTEROLEMIA (ICD-272.0)    DIABETES MELLITUS, TYPE II (ICD-250.00)    HYPOTHYROIDISM (ICD-244.9)    IRRITABLE BOWEL SYNDROME (ICD-564.1)    COLONIC POLYPS, ADENOMATOUS (ICD-211.3)    Hx of IRON DEFICIENCY ANEMIA SECONDARY TO BLOOD LOSS (ICD-280.0)    VITAMIN B12 DEFICIENCY (ICD-266.2)    UTI'S, RECURRENT (ICD-599.0)    DEGENERATIVE DISC DISEASE, CERVICAL SPINE (ICD-722.4)    LOW BACK PAIN, CHRONIC (ICD-724.2)    HYPERURICEMIA (ICD-790.6)    Hx of CARPAL TUNNEL SYNDROME, BILATERAL (ICD-354.0)    Hx of PSEUDOTUMOR CEREBRI (ICD-348.2)    PARESTHESIA (ICD-782.0)    ANXIETY (ICD-300.00)    OTHER ISOLATED OR SPECIFIC PHOBIAS (ICD-300.29)    OTHER SPECIFIED DISORDER OF SKIN (ICD-709.8)    IRITIS (ICD-364.3)    GERD HEMORRHOIDS  Past Surgical History:    Reviewed history from 03/05/2008 and no changes required:       Hysterectomy - yrs ago       Oophorectomy - S/P Rt Oopherectomy by DrFore 9/03 (Ovarian Cyst + adhesions)       Herniorrhaphy - Abd wall hernia repaired by [facility] 9/03       Anal  Fissure surg - 1977 by DrMauriceLeBauer  Family History:    Reviewed history from 11/04/2007 and no changes required:       mother died age 72       father died age 58 from prostate cancer       1 sister--alive age 67  Social History:    Reviewed history from 11/04/2007 and no changes required:       records specialist       married       no children       non smoker       no etoh   Review of Systems        The pertinent positives and negatives are noted as above and in the HPI. All other ROS were negative.   Vital Signs:  Patient Profile:   65 Years Old Female Height:     66 inches Weight:      207.13 pounds BMI:     33.55 Pulse rate:   88 / minute Pulse rhythm:   regular BP sitting:   110 / 62  (left arm)  Vitals Entered By: Christian Mate CMA (April 06, 2008 2:02 PM)                  Physical Exam  General:     Well developed, well nourished, no acute distress. obese.   Head:     Normocephalic and atraumatic. Eyes:     PERRLA, no icterus. Ears:     Normal auditory acuity. Mouth:     No deformity or lesions, dentition normal. Neck:     Supple; no masses or thyromegaly. Chest Wall:     Symmetrical;  no deformities or tenderness. Lungs:     Clear throughout to auscultation. Heart:     Regular rate and rhythm; no murmurs, rubs,  or bruits. Abdomen:     Soft, nontender and nondistended. No masses, hepatosplenomegaly or hernias noted. Normal bowel sounds. Rectal:     deferred until time of colonoscopy.   Msk:     Symmetrical with no gross deformities. Normal posture. Pulses:     Normal pulses noted. Extremities:     No clubbing, cyanosis, edema or deformities noted. Neurologic:     Alert and  oriented x4;  grossly normal neurologically. Skin:     Intact without significant lesions or rashes. Cervical Nodes:     No significant cervical adenopathy. Psych:     Alert and cooperative. Normal mood and affect.   Impression & Recommendations:  Problem # 1:  COLONIC POLYPS, HX OF (ICD-V12.72) Adenomatous colon polyps initially diagnosed in May 2004. She is due for surveillance colonoscopy. The risks, benefits and alternatives to colonoscopy with possible biopsy and possible polypectomy were discussed with the patient and they consent to proceed. The procedure will be scheduled electively. Orders: Colonoscopy (Colon)   Problem # 2:  CONSTIPATION  (ICD-564.00) Increase fiber and fluid intake.  Problem # 3:  DIABETES MELLITUS, TYPE II (ICD-250.00) Standard adjustments to [place] and other instructions per protocol.  Patient Instructions: 1)  You have been scheduled for a colonoscopy and a previsit to get your instructions.  2)  Colonoscopy brochure given. 3)  Conscious Sedation brochure given. 4)  High Fiber, Low Fat Healthy Eating Plan brochure given. 5)  Copy Sent To: Teressa Lower, MD

## 2010-06-16 NOTE — Miscellaneous (Signed)
Summary: V12.72  Clinical Lists Changes  Medications: Added new medication of MOVIPREP 100 GM  SOLR (PEG-KCL-NACL-NASULF-NA ASC-C) As per prep instructions. - Signed Rx of MOVIPREP 100 GM  SOLR (PEG-KCL-NACL-NASULF-NA ASC-C) As per prep instructions.;  #1 x 0;  Signed;  Entered by: Salome Arnt RN;  Authorized by: Ladene Artist MD [facility];  Method used: Electronically to [facility]. [id number]*, [address], [place], [place]  [address], Ph: [phone] or [phone], Fax: [phone]    Prescriptions: MOVIPREP 100 GM  SOLR (PEG-KCL-NACL-NASULF-NA ASC-C) As per prep instructions.  #1 x 0   Entered by:   Salome Arnt RN   Authorized by:   Ladene Artist MD [facility]   Signed by:   Salome Arnt RN on 05/13/2008   Method used:   Electronically to        [facility]. [id number]* (retail)       [place].       [place], [place]  [address]       Ph: [phone] or [phone]       Fax: [phone]   RxID:   [id number]

## 2010-06-16 NOTE — Progress Notes (Signed)
Summary: question about medication  Phone Note Call from Patient   Caller: Patient Call For: nadel Summary of Call: have question about her diabetic medication Patient's chart has been requested.  Initial call taken by: Gustavus Bryant,  July 09, 2007 11:40 AM  Follow-up for Phone Call        Done.  Pt was confused about how many times to takes glucovance.  I advised two times a day and also she thought that she was still supposed to taking metformin hcl.  I advised her to take the glucovance only and if any more ?'s or problems tcb. Follow-up by: Tilden Dome,  July 09, 2007 11:49 AM

## 2010-06-16 NOTE — Assessment & Plan Note (Signed)
Summary: follow up/ mbw   Chief Complaint:  4 month ROV....  History of Present Illness: 65  y/o BF here for a follow up visit... he has multiple medical problems as noted below...    ~  seen 6/09:  she has seen DrAltheimer for a diabetic/ endocrine consult (see his note)- he made several changes which she has readily embraced including: change to Metformin & Byetta, adding Crestor, & changing Levothy to brand name Synthroid 36mcg/d...    ~  March 05, 2008:  she continues to improve w/ drAltheimer's regimen (note of 9/09 reviewed)... she has lost another 12#...    Current Problem List:  ESSENTIAL HYPERTENSION - controlled on CARDIZEM CD 180mg /d, NORVASC 10mg /d, LISINOPRIL 20mg /d, & LASIX 20mg /d... not checking BP's at home but [facility], and BP=110/70 today... she denies HA, fatigue, visual changes, CP, palipit, dizziness, syncope, dyspnea, edema, etc...  ~  labs 10/24/07 by DrAltheimer OK- BUN= 21, Creat= 1.2..Marland Kitchen  Hx of DEEP VENOUS THROMBOPHLEBITIS, VENOUS INSUFFICIENCY - she takes ASA 81mg /d, avoids sodium, elevates legs and wears support hose as needed...  HYPERCHOLESTEROLEMIA (ICD-272.0) - on CRESTOR 20mg /d per DrAltheimer...  ~  [place] 6/09 showed TChol 202, TG 148, HDL 53, LDL 141... Crestor10 started.  ~  [place] 9/09 on Crestor10 showed TChol 162, TG 143, HDL 49, LDL 96... Crestor incr to 20mg /d.  DIABETES MELLITUS, TYPE II (ICD-250.00) - on METFORMIN 500mg Bid & BYETTA 5-56mcgBid per DrAlth... diet counselling and DM education thru his staff...  ~  labs 8/08 showed BS=134, HgA1c=5.6 (on Glucovance 250/1.25 qAM)...   ~  labs 2/09 showed BS= 203, HgA1c= 6.9(on MetforminER 500mg /d)... switched back to Glucovance.  ~  labs 10/24/07 showed BS= 123, HgA1c= 5.5.Marland KitchenMarland Kitchen DrAlth changed to Metformin/Byetta...  HYPOTHYROIDISM (ICD-244.9) - on SYNTHROID 71mcg/d...  ~  labs 7/08 showed TSH norm at 2.5.Marland KitchenMarland Kitchen TSH= 1.90 in Feb09...   ~  TSH 10/24/07= 3.56 & switched to Synthroid 52mcg/d by  DrAlth...  REFLUX ESOPHAGITIS, IRRITABLE BOWEL SYNDROME, COLONIC POLYPS, ADENOMATOUS - Last colonoscopy 5/04 by DrStark - adenomatous polyp removed, +hem, f/u 5 yrs.  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  ~  labs 2/09 showed Vit B12 level = 599...  UTI'S, RECURRENT (ICD-599.0)  DEGENERATIVE DISC DISEASE, CERVICAL SPINE; LOW BACK PAIN, CHRONIC; & HYPERURICEMIA - Eval & Rx by [facility] 3/02 w/ multilevel DDD - C4-5 C5-6 C6-7;  conservative Rx & improved... Eval & Rx by DrKramer & Joie Bimler 9/07 - L2 compression & pelvic fx after fall; subseq Rx w/ shots for Lumbar spondy & improved.  ~  BMD 2/09 normal w/ TScores +2.4 in spine, and -0.7 in fem neck...  2/09 VitD level only 14 (30-90) & started on VitD 50K/wk...   ~  labs 6/09 by DrAlt showed Vit D level 31... rec- continue 50K/wk...  Hx of CARPAL TUNNEL SYNDROME, BILATERAL - Eval & Rx by [facility] & DrSypher in 2001 w/ wrist splints & improved.  Hx of PSEUDOTUMOR CEREBRI & PARESTHESIA - Eval & Rx by DrBrewington/Tyson & Gean Quint 1984 - treated w/ Diamox (headaches resolved).  ANXIETY, OTHER ISOLATED OR SPECIFIC PHOBIAS -  mult somatic complaints - Rx attempt w/ Cymbalta (no change)... she can't/ won't swallow big pills...  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  Hx of IRITIS (ICD-364.3) - Eval DrTyson 12/05 was neg.  Current Allergies (reviewed today): ! PENICILLIN V POTASSIUM (PENICILLIN V POTASSIUM) ! TOPROL XL (METOPROLOL SUCCINATE)  Past Medical History:        ESSENTIAL HYPERTENSION (ICD-401.9)    Hx of DEEP VENOUS THROMBOPHLEBITIS (ICD-453.40)    VENOUS INSUFFICIENCY (ICD-459.81)    HYPERCHOLESTEROLEMIA (ICD-272.0)    DIABETES MELLITUS, TYPE II (ICD-250.00)    HYPOTHYROIDISM (ICD-244.9)    REFLUX ESOPHAGITIS (ICD-530.11)    IRRITABLE BOWEL SYNDROME  (ICD-564.1)    COLONIC POLYPS, ADENOMATOUS (ICD-211.3)    Hx of IRON DEFICIENCY ANEMIA SECONDARY TO BLOOD LOSS (ICD-280.0)    VITAMIN B12 DEFICIENCY (ICD-266.2)    UTI'S, RECURRENT (ICD-599.0)    DEGENERATIVE DISC DISEASE, CERVICAL SPINE (ICD-722.4)    LOW BACK PAIN, CHRONIC (ICD-724.2)    HYPERURICEMIA (ICD-790.6)    Hx of CARPAL TUNNEL SYNDROME, BILATERAL (ICD-354.0)    Hx of PSEUDOTUMOR CEREBRI (ICD-348.2)    PARESTHESIA (ICD-782.0)    ANXIETY (ICD-300.00)    OTHER ISOLATED OR SPECIFIC PHOBIAS (ICD-300.29)    OTHER SPECIFIED DISORDER OF SKIN (ICD-709.8)    IRITIS (ICD-364.3)      Past Surgical History:    Hysterectomy - yrs ago    Oophorectomy - S/P Rt Oopherectomy by DrFore 9/03 (Ovarian Cyst + adhesions)    Herniorrhaphy - Abd wall hernia repaired by [facility] 9/03    Anal Fissure surg - 1977 by DrMauriceLeBauer   Family History:    Reviewed history from 11/04/2007 and no changes required:       mother died age 46       father died age 80 from prostate cancer       1 sister--alive age 58  Social History:    Reviewed history from 11/04/2007 and no changes required:       records specialist       married       no children       non smoker       no etoh   Risk Factors:  Tobacco use:  never   Review of Systems       The patient complains of dyspnea on exertion and difficulty walking.  The patient denies anorexia, fever, weight loss, weight gain, vision loss, decreased hearing, hoarseness, chest pain, syncope, peripheral edema, prolonged cough, headaches, hemoptysis, abdominal pain, melena, hematochezia, severe indigestion/heartburn, hematuria, incontinence, muscle weakness, suspicious skin lesions, transient blindness, depression, unusual weight change, abnormal bleeding, enlarged lymph nodes, and angioedema.     Vital Signs:  Patient Profile:   65 Years Old Female Weight:      209 pounds O2 Sat:      100 % O2 treatment:    Room Air Temp:     96.9 degrees F  oral Pulse rate:   73 / minute BP sitting:   110 / 70  (left arm) Cuff size:   regular  Vitals Entered By: Doroteo Glassman RN (March 05, 2008 10:41 AM)             Is Patient Diabetic? Yes Comments Medications reviewed with patient Doroteo Glassman RN  March 05, 2008 10:43 AM      Physical Exam  WD, WN, 65 y/o BF in NAD... GENERAL:  Alert & oriented; pleasant & cooperative... HEENT:  [place]/AT, EOM-wnl, PERRLA, EACs-clear, TMs-wnl, NOSE-clear, THROAT-clear & wnl. NECK:  Supple w/ fairROM; no JVD; normal carotid impulses w/o bruits; no thyromegaly or nodules palpated; no lymphadenopathy. CHEST:  Clear to P & A; without wheezes/ rales/ or rhonchi. HEART:  Regular Rhythm; gr 1/6  SEM without rubs or gallops heard... ABDOMEN:  Obese, soft & nontender; + abd panniculus, normal bowel sounds; no organomegaly or masses detected. EXT: without deformities, mild arthritic changes; no varicose veins/ +venous insuffic/ no edema. NEURO:  CN's intact; no focal neuro deficits... DERM:  No lesions noted; no rash etc...        Impression & Recommendations:  Problem # 1:  ESSENTIAL HYPERTENSION (ICD-401.9) Controlled- discussed trying to decr the Cardizem... she can't swallow well anyway & opens it up to place beads in applesauce... therefore try to decr to 1/2 the amt daily and watch BP...  Her updated medication list for this problem includes:    Cardizem Cd 180 Mg Cp24 (Diltiazem hcl coated beads) .Marland Kitchen... 1 tab daily    Amlodipine Besylate 10 Mg Tabs (Amlodipine besylate) .Marland Kitchen... 1 tab daily    Lisinopril 20 Mg Tabs (Lisinopril) .Marland Kitchen... 1 tab daily    Furosemide 20 Mg Tabs (Furosemide) .Marland Kitchen... 1 tab in am  Orders: Venipuncture [id number]) T-Vitamin D (25-Hydroxy) [id number]) TLB-BMP (Basic Metabolic Panel-BMET) (99991111) TLB-Uric Acid, Blood (84550-URIC)   Problem # 2:  HYPERCHOLESTEROLEMIA (ICD-272.0) She has been tolerating well and will f/u w/ labs by DrAltheimer... Her updated  medication list for this problem includes:    Crestor 20 Mg Tabs (Rosuvastatin calcium) .Marland Kitchen... Take 1 tab by mouth once daily...   Problem # 3:  DIABETES MELLITUS, TYPE II (ICD-250.00) Weight is down on the Byetta + diet... f/u w/ DrA. Her updated medication list for this problem includes:    Ecotrin Low Strength 81 Mg Tbec (Aspirin) .Marland Kitchen... 1 tab daily    Lisinopril 20 Mg Tabs (Lisinopril) .Marland Kitchen... 1 tab daily    Metformin Hcl 500 Mg Tabs (Metformin hcl) .Marland Kitchen... Take 1 tab by mouth two times a day    Byetta 10 Mcg Pen 10 Mcg/0.11ml Soln (Exenatide) ..... Inject 47mcg two times a day as directed...   Problem # 4:  HYPOTHYROIDISM (ICD-244.9) Stable- same med. Her updated medication list for this problem includes:    Synthroid 88 Mcg Tabs (Levothyroxine sodium) .Marland Kitchen... Take 1 tablet by mouth once a day   Problem # 5:  COLONIC POLYPS, ADENOMATOUS (ICD-211.3) Due for f/u colonoscopy...  Problem # 6:  LOW BACK PAIN, CHRONIC (ICD-724.2) She needs to incr her exercise... Her updated medication list for this problem includes:    Ecotrin Low Strength 81 Mg Tbec (Aspirin) .Marland Kitchen... 1 tab daily   Problem # 7:  HYPERURICEMIA (ICD-790.6) On Allopurinol... recheck her urate level...  Problem # 8:  ANXIETY (ICD-300.00) Continue same med. Her updated medication list for this problem includes:    Alprazolam 0.5 Mg Tabs (Alprazolam) .Marland Kitchen... 1 tab three times a day prn   Complete Medication List: 1)  Ecotrin Low Strength 81 Mg Tbec (Aspirin) .Marland Kitchen.. 1 tab daily 2)  Cardizem Cd 180 Mg Cp24 (Diltiazem hcl coated beads) .Marland Kitchen.. 1 tab daily 3)  Amlodipine Besylate 10 Mg Tabs (Amlodipine besylate) .Marland Kitchen.. 1 tab daily 4)  Lisinopril 20 Mg Tabs (Lisinopril) .Marland Kitchen.. 1 tab daily 5)  Furosemide 20 Mg Tabs (Furosemide) .Marland Kitchen.. 1 tab in am 6)  Crestor 20 Mg Tabs (Rosuvastatin calcium) .... Take 1 tab by mouth once daily.Marland KitchenMarland Kitchen 7)  Metformin Hcl 500 Mg Tabs (Metformin hcl) .... Take 1 tab by mouth two times a day 8)  Byetta 10 Mcg Pen 10  Mcg/0.82ml Soln (Exenatide) .... Inject 80mcg two times a day as directed... 9)  Synthroid 88 Mcg Tabs (Levothyroxine sodium) .... Take 1 tablet by  mouth once a day 10)  Allopurinol 300 Mg Tabs (Allopurinol) .Marland Kitchen.. 1 tab daily 11)  Alprazolam 0.5 Mg Tabs (Alprazolam) .Marland Kitchen.. 1 tab three times a day prn 12)  Cyanocobalamin 1000 Mcg/ml Inj Soln (Cyanocobalamin) .Marland Kitchen.. 1000 micrograms subcutaneously qmonth 13)  Vitamin D 50000 Unit Caps (Ergocalciferol) .Marland Kitchen.. 1 by mouth every week   Patient Instructions: 1)  Today we updated your med list- see below.... 2)  Today we checked your Uric acid level and your Vit D level... please call the "phone tree" in a few days for your lab results.Marland KitchenMarland Kitchen 3)  DrAltheimer has been doing your Diabetic labs, Cholesterol and Thyroid.Marland KitchenMarland Kitchen 4)  We decided to try and decrease the Cardizem dose to 1/2 daily as discussed.Marland KitchenMarland Kitchen 5)  Call for any problems.Marland KitchenMarland Kitchen 6)  Please schedule a follow-up appointment in 6 months, sooner as needed.   ]

## 2010-06-16 NOTE — Letter (Signed)
Summary: [facility]  [facility]   Imported By: Phillis Knack 03/09/2009 08:43:25  _____________________________________________________________________  External Attachment:    Type:   Image     Comment:   External Document

## 2010-06-16 NOTE — Assessment & Plan Note (Signed)
Summary: 6 months/apc   Primary Care Provider:  Teressa Lower MD  CC:  6 month ROV & review of mult medical problems....  History of Present Illness: 65  y/o BF here for a follow up visit... he has multiple medical problems as noted below...    ~  Jun/09:  she has seen DrAltheimer for a diabetic/ endocrine consult (see his note)- he made several changes which she has readily embraced including: change to Metformin & Byetta, adding Crestor, & changing Levothy to brand name Synthroid 29mcg/d...   ~  Oct09:  she continues to improve w/ DrAltheimer's regimen (note of 9/09 reviewed)... she has lost another 12#.Marland Kitchen.   ~  September 01, 2008:  she continues to see DrAltheimer every 2-3 months w/ all labs done by his office and by all accounts she is doing beautifully... no new complaints or concerns... she requests several refills to [facility] for 90d supplies...    Current Problem List:  ESSENTIAL HYPERTENSION - controlled on NORVASC 10mg /d, LISINOPRIL 20mg /d, & LASIX 20mg /d... not checking BP's at home but [facility], and BP=124/82 today... she denies HA, fatigue, visual changes, CP, palipit, dizziness, syncope, dyspnea, edema, etc...  ~  labs 10/24/07 by DrAltheimer OK- BUN= 21, Creat= 1.2...  ~  labs 12/09 by DrA showed BUN= 15, Creat= 1.2, others OK.  Hx of DEEP VENOUS THROMBOPHLEBITIS, VENOUS INSUFFICIENCY - she takes ASA 81mg /d, avoids sodium, elevates legs and wears support hose as needed...  HYPERCHOLESTEROLEMIA (ICD-272.0) - on CRESTOR 20mg /d per DrAltheimer...  ~  [place] 6/09 showed TChol 202, TG 148, HDL 53, LDL 141... Crestor10 started.  ~  [place] 9/09 on Crestor10 showed TChol 162, TG 143, HDL 49, LDL 96... Crestor incr to 20mg /d.  ~  [place] 12/09 on Cres20 showed TChol 94, TG 111, HDL 52, LDL 35  DIABETES MELLITUS, TYPE II (ICD-250.00) - on METFORMIN 500mg Bid & BYETTA 18mcgBid per DrAlth... diet counselling and DM education thru his staff...  ~  labs 8/08 showed BS=134, HgA1c=5.6 (on  Glucovance 250/1.25 qAM)...   ~  labs 2/09 showed BS= 203, HgA1c= 6.9(on MetforminER 500mg /d)... switched back to Glucovance.  ~  labs 6/09 showed BS= 123, HgA1c= 5.5.Marland KitchenMarland Kitchen DrA changed to Metformin/Byetta...  ~  labs 12/09 showed BS= 100, A1c= 5.9.Marland KitchenMarland Kitchen DrA kept eveything the same.  HYPOTHYROIDISM (ICD-244.9) - on SYNTHROID 36mcg/d...  ~  labs 7/08 showed TSH norm at 2.5.Marland KitchenMarland Kitchen TSH= 1.90 in Feb09...   ~  TSH 10/24/07= 3.56 & switched to Synthroid 71mcg/d by DrAlth...  ~  labs 12/09 by DrA showed TSH= 0.40  REFLUX ESOPHAGITIS, IRRITABLE BOWEL SYNDROME, COLONIC POLYPS, ADENOMATOUS - Last colonoscopy 5/04 by DrStark - adenomatous polyp removed, +hem, f/u 5 yrs.  Hx of IRON DEFICIENCY ANEMIA SECONDARY TO BLOOD LOSS, VITAMIN B12 DEFICIENCY - on B12 shots 1071mcg Sq monthly...  ~  labs 2/09 showed Vit B12 level = 599...  UTI'S, RECURRENT (ICD-599.0)  DEGENERATIVE DISC DISEASE, CERVICAL SPINE; LOW BACK PAIN, CHRONIC; & HYPERURICEMIA - Eval & Rx by [facility] 3/02 w/ multilevel DDD - C4-5 C5-6 C6-7;  conservative Rx & improved... Eval & Rx by DrKramer & Joie Bimler 9/07 - L2 compression & pelvic fx after fall; subseq Rx w/ shots for Lumbar spondy & improved.  ~  BMD 2/09 normal w/ TScores +2.4 in spine, and -0.7 in fem neck...  2/09 VitD level only 14 (30-90) & started on VitD 50K/wk...   ~  labs 6/09 by DrAlt showed Vit D level 31... rec- continue 50K/wk.Marland KitchenMarland Kitchen  Hx of CARPAL TUNNEL SYNDROME, BILATERAL - Eval & Rx by [facility] & DrSypher in 2001 w/ wrist splints & improved.  Hx of PSEUDOTUMOR CEREBRI & PARESTHESIA - Eval & Rx by DrBrewington/Tyson & Gean Quint 1984 - treated w/ Diamox (headaches resolved).  ANXIETY, OTHER ISOLATED OR SPECIFIC PHOBIAS -  mult somatic complaints - Rx attempt w/ Cymbalta (no change)... she can't/ won't swallow big pills...  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  Hx of IRITIS (ICD-364.3) -  Eval DrTyson 12/05 was neg.     Allergies: 1)  ! Penicillin V Potassium (Penicillin V Potassium) 2)  ! Toprol Xl (Metoprolol Succinate)  Comments:  Nurse/Medical Assistant: The patient's medications and allergies were reviewed with the patient and were updated in the Medication and Allergy Lists.  Past History:  Past Medical History:    ESSENTIAL HYPERTENSION (ICD-401.9)    Hx of DEEP VENOUS THROMBOPHLEBITIS (ICD-453.40)    VENOUS INSUFFICIENCY (ICD-459.81)    HYPERCHOLESTEROLEMIA (ICD-272.0)    DIABETES MELLITUS, TYPE II (ICD-250.00)    HYPOTHYROIDISM (ICD-244.9)    IRRITABLE BOWEL SYNDROME (ICD-564.1)    COLONIC POLYPS, ADENOMATOUS (ICD-211.3)    Hx of IRON DEFICIENCY ANEMIA SECONDARY TO BLOOD LOSS (ICD-280.0)    VITAMIN B12 DEFICIENCY (ICD-266.2)    UTI'S, RECURRENT (ICD-599.0)    DEGENERATIVE DISC DISEASE, CERVICAL SPINE (ICD-722.4)    LOW BACK PAIN, CHRONIC (ICD-724.2)    HYPERURICEMIA (ICD-790.6)    Hx of CARPAL TUNNEL SYNDROME, BILATERAL (ICD-354.0)    Hx of PSEUDOTUMOR CEREBRI (ICD-348.2)    PARESTHESIA (ICD-782.0)    ANXIETY (ICD-300.00)    OTHER ISOLATED OR SPECIFIC PHOBIAS (ICD-300.29)    OTHER SPECIFIED DISORDER OF SKIN (ICD-709.8)    IRITIS (ICD-364.3)    GERD HEMORRHOIDS  Past Surgical History:    Hysterectomy - yrs ago    Oophorectomy - S/P Rt Oopherectomy by DrFore 9/03 (Ovarian Cyst + adhesions)    Herniorrhaphy - Abd wall hernia repaired by [facility] 9/03    Anal Fissure surg - 1977 by DrMauriceLeBauer  Family History:    Reviewed history from 11/04/2007 and no changes required:       mother died age 37       father died age 47 from prostate cancer       1 sister--alive age 37  Social History:    Reviewed history from 11/04/2007 and no changes required:       records specialist       married       no children       non smoker       no etoh  Review of Systems      See HPI  The patient denies anorexia, fever, weight loss, weight gain,  vision loss, decreased hearing, hoarseness, chest pain, syncope, dyspnea on exertion, peripheral edema, prolonged cough, headaches, hemoptysis, abdominal pain, melena, hematochezia, severe indigestion/heartburn, hematuria, incontinence, muscle weakness, suspicious skin lesions, transient blindness, difficulty walking, depression, unusual weight change, abnormal bleeding, enlarged lymph nodes, and angioedema.    Vital Signs:  Patient profile:   65 year old female Height:      66 inches Weight:      199.50 pounds BMI:     32.32 O2 Sat:      95 % Temp:     97.2 degrees F oral Pulse rate:   99 / minute BP sitting:   124 / 82  (right arm) Cuff size:  regular  Vitals Entered By: Ramiro Harvest CMA (September 01, 2008 11:31 AM)  O2 Sat at Rest %:  95 O2 Flow:  room air CC: 6 month ROV & review of mult medical problems... Is Patient Diabetic? Yes  Pain Assessment Patient in pain? no      Comments PT STATED NO CHANGES IN MEDS   Physical Exam  Additional Exam:  WD, WN, 65 y/o BF in NAD... GENERAL:  Alert & oriented; pleasant & cooperative... HEENT:  [place]/AT, EOM-wnl, PERRLA, EACs-clear, TMs-wnl, NOSE-clear, THROAT-clear & wnl. NECK:  Supple w/ fairROM; no JVD; normal carotid impulses w/o bruits; no thyromegaly or nodules palpated; no lymphadenopathy. CHEST:  Clear to P & A; without wheezes/ rales/ or rhonchi. HEART:  Regular Rhythm; gr 1/6 SEM without rubs or gallops heard... ABDOMEN:  Obese, soft & nontender; + abd panniculus, normal bowel sounds; no organomegaly or masses detected. EXT: without deformities, mild arthritic changes; no varicose veins/ +venous insuffic/ no edema. NEURO:  CN's intact; no focal neuro deficits... DERM:  No lesions noted; no rash etc...     Impression & Recommendations:  Problem # 1:  ESSENTIAL HYPERTENSION (ICD-401.9) Stable on meds-  continue the same. The following medications were removed from the medication list:    Cardizem Cd 180 Mg Cp24 (Diltiazem  hcl coated beads) .Marland Kitchen... 1 tab daily Her updated medication list for this problem includes:    Amlodipine Besylate 10 Mg Tabs (Amlodipine besylate) .Marland Kitchen... 1 tab daily    Lisinopril 20 Mg Tabs (Lisinopril) .Marland Kitchen... 1 tab daily    Furosemide 20 Mg Tabs (Furosemide) .Marland Kitchen... 1 tab in am  Problem # 2:  VENOUS INSUFFICIENCY (ICD-459.81) Stable- reminded no salt, elevate legs, support hose...  Problem # 3:  HYPERCHOLESTEROLEMIA (ICD-272.0) Stable-  same med. Her updated medication list for this problem includes:    Crestor 20 Mg Tabs (Rosuvastatin calcium) .Marland Kitchen... Take 1 tab by mouth once daily...  Problem # 4:  DIABETES MELLITUS, TYPE II (ICD-250.00) Stable on meds per DrAltheimer... Her updated medication list for this problem includes:    Ecotrin Low Strength 81 Mg Tbec (Aspirin) .Marland Kitchen... 1 tab daily    Lisinopril 20 Mg Tabs (Lisinopril) .Marland Kitchen... 1 tab daily    Metformin Hcl 500 Mg Tabs (Metformin hcl) .Marland Kitchen... Take 1 tab by mouth two times a day    Byetta 10 Mcg Pen 10 Mcg/0.85ml Soln (Exenatide) ..... Inject 70mcg two times a day as directed...  Problem # 5:  COLONIC POLYPS, HX OF (ICD-V12.72) GI stable and up to date...  Problem # 6:  VITAMIN D DEFICIENCY (ICD-268.9) On Vit D per DrA... continue same...  Problem # 7:  MULT MEDICAL PROBLEMS AS NOTED---  Complete Medication List: 1)  Ecotrin Low Strength 81 Mg Tbec (Aspirin) .Marland Kitchen.. 1 tab daily 2)  Amlodipine Besylate 10 Mg Tabs (Amlodipine besylate) .Marland Kitchen.. 1 tab daily 3)  Lisinopril 20 Mg Tabs (Lisinopril) .Marland Kitchen.. 1 tab daily 4)  Furosemide 20 Mg Tabs (Furosemide) .Marland Kitchen.. 1 tab in am 5)  Crestor 20 Mg Tabs (Rosuvastatin calcium) .... Take 1 tab by mouth once daily.Marland KitchenMarland Kitchen 6)  Metformin Hcl 500 Mg Tabs (Metformin hcl) .... Take 1 tab by mouth two times a day 7)  Byetta 10 Mcg Pen 10 Mcg/0.33ml Soln (Exenatide) .... Inject 77mcg two times a day as directed... 8)  Synthroid 88 Mcg Tabs (Levothyroxine sodium) .... Take 1 tablet by mouth once a day 9)  Allopurinol 300  Mg Tabs (Allopurinol) .Marland Kitchen.. 1 tab daily 10)  Alprazolam 0.5  Mg Tabs (Alprazolam) .Marland Kitchen.. 1 tab three times a day prn 11)  Cyanocobalamin 1000 Mcg/ml Inj Soln (Cyanocobalamin) .Marland Kitchen.. 1000 micrograms subcutaneously qmonth 12)  Vitamin D 50000 Unit Caps (Ergocalciferol) .Marland Kitchen.. 1 by mouth every week  Patient Instructions: 1)  Today we updated your med list- see below.... 2)  Continue your current meds the same... 3)  Call for any problems.Marland KitchenMarland Kitchen 4)  Please schedule a follow-up appointment in 6 months. Prescriptions: LISINOPRIL 20 MG  TABS (LISINOPRIL) 1 tab daily  #90 x 3   Entered by:   Ramiro Harvest CMA   Authorized by:   Noralee Space MD   Signed by:   Ramiro Harvest CMA on 09/01/2008   Method used:   Faxed to ...       [facility]* (mail-order)             ,          Ph: [phone]       Fax: [phone]   RxIDKX:[id number] AMLODIPINE BESYLATE 10 MG  TABS (AMLODIPINE BESYLATE) 1 tab daily  #90 x 3   Entered by:   Ramiro Harvest CMA   Authorized by:   Noralee Space MD   Signed by:   Ramiro Harvest CMA on 09/01/2008   Method used:   Faxed to ...       [facility] (mail-order)             ,          Ph: [phone]       Fax: [phone]   RxID:   [id number]

## 2010-06-16 NOTE — Progress Notes (Signed)
Summary: amlodipine and lisinopril refills to [facility]  Phone Note Call from Patient   Caller: Patient Call For: nadel Summary of Call: amlodipine and lisinopril for 90 day supply faxed to [facility] Initial call taken by: Gustavus Bryant,  December 31, 2009 1:06 PM  Follow-up for Phone Call        last ov w/ SN 5.31.11, upcoming 11.28.11.  90days supply sent to [facility] with no refills to ensure pt keeps her appt.  pt is aware. Parke Poisson CNA/MA  December 31, 2009 1:51 PM     Prescriptions: LISINOPRIL 20 MG  TABS (LISINOPRIL) take 1/2 tab by mouth once daily...  #45 x 0   Entered by:   Parke Poisson CNA/MA   Authorized by:   Noralee Space MD   Signed by:   Parke Poisson CNA/MA on 12/31/2009   Method used:   Electronically to        [facility] (retail)             ,          Ph: [phone]       Fax: [phone]   RxIDBQ:[id number] AMLODIPINE BESYLATE 10 MG  TABS (AMLODIPINE BESYLATE) take 1/2 tab by mouth once daily...  #45 x 0   Entered by:   Parke Poisson CNA/MA   Authorized by:   Noralee Space MD   Signed by:   Parke Poisson CNA/MA on 12/31/2009   Method used:   Electronically to        [facility] (retail)             ,          Ph: [phone]       Fax: [phone]   RxID:   [id number]

## 2010-06-16 NOTE — Progress Notes (Signed)
Summary: bloodclot  Phone Note Call from Patient Call back at Home Phone [phone]   Caller: Patient Call For: Salwa Bai Reason for Call: Talk to Nurse Summary of Call: red splotches on her right leg, streaks, has gotten larger, not hot to pt/  Wants to have a doppler today if possible to r/o blood clots. Initial call taken by: Zigmund Gottron,  Oct 07, 2009 9:56 AM  Follow-up for Phone Call        pls advise on a time for pt today    [place]  Oct 07, 2009 10:59 AM    per SN---ok for venous dopplers today.  thanks [place]  Oct 07, 2009 11:12 AM   order placed. pt advised. Jetmore Bing CMA  Oct 07, 2009 11:19 AM

## 2010-06-16 NOTE — Letter (Signed)
Summary: [facility]  [facility]   Imported By: Phillis Knack 05/27/2010 12:47:29  _____________________________________________________________________  External Attachment:    Type:   Image     Comment:   External Document

## 2010-06-16 NOTE — Progress Notes (Signed)
Summary: refills  Phone Note Call from Patient Call back at Home Phone [phone]   Caller: Patient Call For: nadel Reason for Call: Refill Medication Summary of Call: Pt requests refills on amlodipine 10mg  and lisinopril 120mg , (90- day supply of each), also [facility] to [place] [place]. Initial call taken by: Netta Neat,  April 04, 2010 1:59 PM  Follow-up for Phone Call        Spoke with pt and verified the msg.  Rxs were sent to pharm and I advised that she keep her rov on 04/11/10. Pt verbalized understanding. Follow-up by: Tilden Dome,  April 04, 2010 3:28 PM    Prescriptions: AMLODIPINE BESYLATE 10 MG  TABS (AMLODIPINE BESYLATE) take 1/2 tab by mouth once daily...  #45 x 0   Entered by:   Tilden Dome   Authorized by:   Noralee Space MD   Signed by:   Tilden Dome on 04/04/2010   Method used:   Electronically to        [facility] (retail)       [address]       [place], [place]  [address]       Ph: [phone]       Fax: [phone]   RxID:   [id number] LISINOPRIL 20 MG  TABS (LISINOPRIL) take 1/2 tab by mouth once daily...  #45 x 0   Entered by:   Tilden Dome   Authorized by:   Noralee Space MD   Signed by:   Tilden Dome on 04/04/2010   Method used:   Electronically to        [facility] (retail)       [address]       [place], [place]  [address]       Ph: [phone]       Fax: [phone]   RxID:   [id number] FUROSEMIDE 20 MG  TABS (FUROSEMIDE) 1 tab in AM  #90 x 0   Entered by:   Tilden Dome   Authorized by:   Noralee Space MD   Signed by:   Tilden Dome on 04/04/2010   Method used:   Electronically to        [facility] (retail)       [address]       [place], [place]  [address]       Ph: [phone]       Fax: [phone]   RxID:   [id number]

## 2010-06-16 NOTE — Letter (Signed)
Summary: [facility]   Imported By: Edmonia James 04/24/2008 09:20:35  _____________________________________________________________________  External Attachment:    Type:   Image     Comment:   External Document

## 2010-06-16 NOTE — Miscellaneous (Signed)
Summary: dexa results  Clinical Lists Changes Spoke with pt. and advised bone density test was normal per Dr. Lenna Gilford. .................................................................Marland KitchenMarland KitchenTilden Dome  July 26, 2007 4:26 PM

## 2010-06-16 NOTE — Letter (Signed)
Summary: [facility]  [facility]   Imported By: Phillis Knack 08/19/2009 11:55:21  _____________________________________________________________________  External Attachment:    Type:   Image     Comment:   External Document

## 2010-06-16 NOTE — Progress Notes (Signed)
Summary: waiting on return call  Phone Note Other Incoming   Summary of Call: called pts home number to give her the results of the doppler that she had done today and to give her recs of SN---her husband answered the phone and requested that i call back in about 3-4 mins since he was on the phone with someone.  i explained that i was calling from [facility] office and he said that he was still on the phone.  gave him a message for pt to return my call. per SN--ok for pt to have keflex 1 by mouth four times daily  #28 Elita Boone CMA  Oct 07, 2009 4:29 PM   Follow-up for Phone Call        pt stated that she has a hard time swallowing the pills and requested that this be sent in liquid form--ok per SN and this has been called to the pharmacy--i spoke with Minette Brine at [facility] on [address] Follow-up by: Elita Boone CMA,  Oct 07, 2009 4:44 PM    New/Updated Medications: CEPHALEXIN 250 MG/5ML SUSR (CEPHALEXIN) dosed by pharmacy four times daily x 5 days

## 2010-06-16 NOTE — Assessment & Plan Note (Signed)
Summary: 6 months/apc   Primary Care Provider:  Teressa Lower MD  CC:  6 month ROV & review of mult medical problems....  History of Present Illness: 65 y/o Katie [name] here for a follow up visit... he has multiple medical problems including HBP;  Hyperchol, DM, & Hypothyroid followed by DrAltheimer;  Neck & Back pain followed by DrCabbell & DrKramer;  Osteopenia & Vit D defic;  Anemia & Vit B12 defic...    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 Katie [name] reports a good 6 mo- just c/o some foot cramps intermittently (Katie [name] would like muscle relaxer & Robaxin written)... her weight remains at 200# & we discussed diet + exercise & the need to lose weight... Katie [name] will get her 2010 flu shot at work...   ~  Oct 12, 2009:  Katie [name] continues to follow up w/ DrAltheimer every 55mo w/ complete lab data... Katie [name] has VI & recent ven dopplers were neg for DVT... Katie [name] saw DrCabbell 3/11 w/ MRI of lumbar spine- signif degen changes at L4-5 L5-S1 & facet arthropathy> given 2 shots & improved... Katie [name] notes decr hearing in left ear> assoc w/ cerumen impaction... also wants Alpraz refilled...   ~  April 11, 2010:  Katie [name] is c/o incr back pain in the TSpine area for the past month (XRay today shows sl scoliosis & mild spondylosis, NAD)... we discussed Rx w/ Tramadol & Robaxin, rest & heat... Katie [name] saw DrCabbell 8/11 for lumbar back pain & his note is reviewed> he did a series of lumbar injections & ordered EMG/ NCV to check for DM neuropathy (?results)...  Katie [name] continues to see DrAltheimer every 32mo w/ complete labs done at every visit> his note of 02/07/10 is reviewed w/ the pt...   Current Problem List:  ESSENTIAL HYPERTENSION - controlled on ASA 81mg /d, NORVASC 10mg - 1/2 tab, LISINOPRIL 20mg - 1/2 tab, & LASIX 20mg /d... not checking BP's at home but [facility],  DrAltheimers office, and BP=120/74 today... Katie [name] denies HA, fatigue, visual changes, CP, palipit, dizziness, syncope, dyspnea, edema, etc...  Hx of DEEP VENOUS THROMBOPHLEBITIS, VENOUS INSUFFICIENCY - Katie [name] takes ASA 81mg /d, avoids sodium, elevates legs and wears support hose as needed...  ~  Lawson Fiscal Dopplers 5/11 = neg for DVT...  HYPERCHOLESTEROLEMIA (ICD-272.0) - on CRESTOR 20mg - taking 1/2 tab per DrAltheimer & well controlled> he does her full labs every 74months... his latest note & labs from his office reviewed w/ the pt...  DIABETES MELLITUS, TYPE II (ICD-250.00) - on METFORMIN 500mg Bid (takes liq Riomet) & BYETTA 38mcgBid per DrAltheimer w/ good control & A1c's around 6.0... diet counselling and DM education thru his staff and full labs checked every 3 months as noted... most recent data reviewed w/ pt.  HYPOTHYROIDISM (ICD-244.9) - on SYNTHROID 59mcg/d & labs monitored every 57months by DrAltheimer> stable on this dose w/ TSH ~1.0 range...  REFLUX ESOPHAGITIS, IRRITABLE BOWEL SYNDROME, COLONIC POLYPS, ADENOMATOUS -   ~  colonoscopy 5/04 by DrStark - adenomatous polyp removed, +hems...  ~  f/u colonoscopy 1/10 by DrStark w/ hems & several polyps removed- adenomatous, f/u planned 43yrs.  Hx of IRON DEFICIENCY ANEMIA SECONDARY TO BLOOD LOSS, VITAMIN B12 DEFICIENCY - on B12 shots 1041mcg Sq monthly per DrAltheimer's office... he does B12 levels on her as well every 3 months...  UTI'S, RECURRENT (ICD-599.0) - Katie [name] is followed by DrMezer  for Gyn & Katie [name] reports neg PAPs... he sent her to DrMacDiarmid for Urology 9/10 for pyuria/ proteinuria/ sm angiomyolipoma upper pole left kidney (otherw neg upper & lower track eval by Urology).  DEGENERATIVE DISC DISEASE, CERVICAL SPINE; LOW BACK PAIN, CHRONIC; & HYPERURICEMIA - on ALLOPURINOL 300mg /d... Eval & Rx by [facility] 3/02 w/ multilevel DDD - C4-5 C5-6 C6-7;  conservative Rx & improved... Eval & Rx by DrKramer & Joie Bimler 9/07 - L2 compression & pelvic fx after  fall; subseq Rx w/ shots for Lumbar spondy & improved.  ~  we have given her TRAMADOL 50mg  Tid as needs & ROBAXIN 500mg  Tid Prn...  VITAMIN D DEFICIENCY (ICD-268.9)  ~  BMD 2/09 normal w/ TScores +2.4 in spine, and -0.7 in fem neck...  2/09 VitD level only 14 (30-90) & started on VitD 50K/wk...   ~  labs 6/09 by DrAlt showed Vit D level 31... rec- continue 50K/wk & he continues to check her levels every 3-6 months.  Hx of CARPAL TUNNEL SYNDROME, BILATERAL - Eval & Rx by [facility] & DrSypher in 2001 w/ wrist splints & improved.  Hx of PSEUDOTUMOR CEREBRI & PARESTHESIA - Eval & Rx by DrBrewington/Tyson & Gean Quint 1984 - treated w/ Diamox (headaches resolved).  ANXIETY, OTHER ISOLATED OR SPECIFIC PHOBIAS -  mult somatic complaints - Rx attempt w/ Cymbalta (no change)... Katie [name] can't/ won't swallow big pills...  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  Hx of IRITIS (ICD-364.3) - Eval DrTyson 12/05 was neg.   Preventive Screening-Counseling & Management  Alcohol-Tobacco     Smoking Status: never  Allergies: 1)  ! Penicillin V Potassium (Penicillin V Potassium) 2)  ! Toprol Xl (Metoprolol Succinate)  Comments:  Nurse/Medical Assistant: The patient's medications and allergies were reviewed with the patient and were updated in the Medication and Allergy Lists.  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 LOSS (ICD-280.0) VITAMIN B12 DEFICIENCY (ICD-266.2) UTI'S, RECURRENT (ICD-599.0) DEGENERATIVE DISC DISEASE, CERVICAL SPINE  (ICD-722.4) LOW BACK PAIN, CHRONIC (ICD-724.2) HYPERURICEMIA (ICD-790.6) VITAMIN D DEFICIENCY (ICD-268.9) Hx of CARPAL TUNNEL SYNDROME, BILATERAL (ICD-354.0) Hx of PSEUDOTUMOR CEREBRI (ICD-348.2) PARESTHESIA (ICD-782.0) ANXIETY (ICD-300.00) OTHER ISOLATED OR SPECIFIC PHOBIAS (ICD-300.29) OTHER SPECIFIED DISORDER OF SKIN (ICD-709.8) IRITIS (ICD-364.3)  Past Surgical History: Hysterectomy - yrs ago Oophorectomy - S/P Rt Oopherectomy by DrFore 9/03 (Ovarian Cyst + adhesions) Herniorrhaphy - Abd wall hernia repaired by [facility] 9/03 Anal Fissure surg - 1977 by DrMauriceLeBauer  Family History: Reviewed history from 11/04/2007 and no changes required. 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  The patient denies anorexia, fever, weight loss, weight gain, vision loss, hoarseness, chest pain, syncope, peripheral edema, prolonged cough, headaches, hemoptysis, abdominal pain, melena, hematochezia, severe indigestion/heartburn, hematuria, incontinence, muscle weakness, suspicious skin lesions, transient blindness, difficulty walking, depression, unusual weight change, abnormal bleeding, enlarged lymph nodes, and angioedema.    Vital Signs:  Patient  profile:   65 year old female Height:      66 inches Weight:      189 pounds BMI:     30.62 O2 Sat:      98 % on Room air Temp:     96.9 degrees F oral Pulse rate:   89 / minute BP sitting:   120 / 74  (left arm) Cuff size:   regular  Vitals Entered By: Elita Boone CMA (April 11, 2010 10:34 AM)  O2 Sat at Rest %:  98 O2 Flow:  Room air CC: 6 month ROV & review of mult medical problems... Is Patient Diabetic? Yes Pain Assessment Patient in pain?  yes      Onset of pain  back pain Comments meds updated today with pt   Physical Exam  Additional Exam:  WD, WN, 65 y/o Katie [name] in NAD... GENERAL:  Alert & oriented; pleasant & cooperative... HEENT:  [place]/AT, EOM-wnl, PERRLA, EACs-clear, TMs-wnl, NOSE-clear, THROAT-clear & wnl. NECK:  Supple w/ fairROM; no JVD; normal carotid impulses w/o bruits; no thyromegaly or nodules palpated; no lymphadenopathy. CHEST:  Clear to P & A; without wheezes/ rales/ or rhonchi. HEART:  Regular Rhythm; gr 1/6 SEM without rubs or gallops heard... ABDOMEN:  Obese, soft & nontender; + abd panniculus, normal bowel sounds; no organomegaly or masses detected. EXT: without deformities, mild arthritic changes; no varicose veins/ +venous insuffic/ no edema. NEURO:  CN's intact; no focal neuro deficits... DERM:  No lesions noted; no rash etc...    MISC. Report  Procedure date:  04/11/2010  Findings:      DATA REVIEWED:  ~  Our prev EMR notes...  DrAltheimer's latest OV 02/07/10 & full labs reviewed w/ pt & he rquestions are answered...  ~  F/u DrCabbell 01/06/10 reviewed as well...   X-ray Musculoskeletal  Procedure date:  04/11/2010  Findings:      THORACIC SPINE - 2 VIEW Comparison: 04/25/2004 chest x-ray   Findings: Slight rightward scoliosis in the mid thoracic spine. Early degenerative disc disease with disc space narrowing and slight anterior spurring.  No fracture or subluxation.   IMPRESSION: Mild spondylosis and scoliosis.  No acute findings.   Read By:  Rolm Baptise,  M.D.   Impression & Recommendations:  Problem # 1:  BACK PAIN, UPPER (ICD-724.5) XRays w/ spondylosis & scoliosis... we discussed Rx w/ rest, heat, Tramadol, Robaxin... Katie [name] will f/u w/ her Ortho & Neurosurg if further eval needed... Her updated medication list for this problem includes:    Ecotrin Low Strength 81 Mg Tbec (Aspirin) .Marland Kitchen... 1 tab daily    Methocarbamol 500 Mg Tabs (Methocarbamol) .Marland Kitchen... Take 1 tab by mouth  three times a day as needed for muscle spasm...    Tramadol Hcl 50 Mg Tabs (Tramadol hcl) .Marland Kitchen... Take 1 tab by mouth three times a day for pain...  Orders: T-Thoracic Spine 2 Views [phone])  Problem # 2:  ESSENTIAL HYPERTENSION (ICD-401.9) Well controlled>  continue same meds. Her updated medication list for this problem includes:    Amlodipine Besylate 10 Mg Tabs (Amlodipine besylate) .Marland Kitchen... Take 1/2 tab by mouth once daily...    Lisinopril 20 Mg Tabs (Lisinopril) .Marland Kitchen... Take 1/2 tab by mouth once daily...    Furosemide 20 Mg Tabs (Furosemide) .Marland Kitchen... 1 tab in am  Problem # 3:  HYPERCHOLESTEROLEMIA (ICD-272.0) Well controlled>  continue same med. Her updated medication list for this problem includes:    Crestor 20 Mg Tabs (Rosuvastatin calcium) .Marland Kitchen... Take 1/2 tab by  mouth once daily...  Problem # 4:  DIABETES MELLITUS, TYPE II (ICD-250.00) Well controlled & managed by DrAltheimer... Her updated medication list for this problem includes:    Ecotrin Low Strength 81 Mg Tbec (Aspirin) .Marland Kitchen... 1 tab daily    Lisinopril 20 Mg Tabs (Lisinopril) .Marland Kitchen... Take 1/2 tab by mouth once daily...    Riomet 500 Mg/59ml Soln (Metformin hcl) .Marland Kitchen... Take 1 tsp by mouth two times a day    Byetta 10 Mcg Pen 10 Mcg/0.73ml Soln (Exenatide) ..... Inject 73mcg two times a day as directed...  Problem # 5:  HYPOTHYROIDISM (ICD-244.9) Well controlled>  continue same dose... Her updated medication list for this problem includes:    Synthroid 88 Mcg Tabs (Levothyroxine sodium) .Marland Kitchen... Take 1 tablet by mouth once a day  Problem # 6:  COLONIC POLYPS, HX OF (ICD-V12.72) Katie [name] is up to date on needed colonoscopy & no problems noted...  Problem # 7:  UTI'S, RECURRENT (ICD-599.0) Followed by DrMezer & DrMacDiarmid... The following medications were removed from the medication list:    Cephalexin 250 Mg/62ml Susr (Cephalexin) .Marland Kitchen... Dosed by pharmacy four times daily x 5 days  Problem # 8:  LOW BACK PAIN, CHRONIC (ICD-724.2) As  noted Katie [name] has diffuse back pain w/ evals from [place]... Her updated medication list for this problem includes:    Ecotrin Low Strength 81 Mg Tbec (Aspirin) .Marland Kitchen... 1 tab daily    Methocarbamol 500 Mg Tabs (Methocarbamol) .Marland Kitchen... Take 1 tab by mouth three times a day as needed for muscle spasm...    Tramadol Hcl 50 Mg Tabs (Tramadol hcl) .Marland Kitchen... Take 1 tab by mouth three times a day for pain...  Problem # 9:  OTHER MEDICAL PROBLEMS AS NOTED>>> Vit B12 & Vit D supplements per DrSAltheimer... On Fe as well... Katie [name] gets Flu shot at work...  Complete Medication List: 1)  Ecotrin Low Strength 81 Mg Tbec (Aspirin) .Marland Kitchen.. 1 tab daily 2)  Amlodipine Besylate 10 Mg Tabs (Amlodipine besylate) .... Take 1/2 tab by mouth once daily.Marland KitchenMarland Kitchen 3)  Lisinopril 20 Mg Tabs (Lisinopril) .... Take 1/2 tab by mouth once daily.Marland KitchenMarland Kitchen 4)  Furosemide 20 Mg Tabs (Furosemide) .Marland Kitchen.. 1 tab in am 5)  Crestor 20 Mg Tabs (Rosuvastatin calcium) .... Take 1/2 tab by mouth once daily.Marland KitchenMarland Kitchen 6)  Riomet 500 Mg/31ml Soln (Metformin hcl) .... Take 1 tsp by mouth two times a day 7)  Byetta 10 Mcg Pen 10 Mcg/0.23ml Soln (Exenatide) .... Inject 52mcg two times a day as directed... 8)  Synthroid 88 Mcg Tabs (Levothyroxine sodium) .... Take 1 tablet by mouth once a day 9)  Allopurinol 300 Mg Tabs (Allopurinol) .Marland Kitchen.. 1 tab daily 10)  Alprazolam 0.5 Mg Tabs (Alprazolam) .... Take 1/2 to 1 tab by mouth three times a day as needed for nerves... 11)  Cyanocobalamin 1000 Mcg/ml Inj Soln (Cyanocobalamin) .Marland Kitchen.. 1000 micrograms subcutaneously qmonth 12)  Vitamin D 50000 Unit Caps (Ergocalciferol) .Marland Kitchen.. 1 by mouth every week 13)  Methocarbamol 500 Mg Tabs (Methocarbamol) .... Take 1 tab by mouth three times a day as needed for muscle spasm... 14)  Tramadol Hcl 50 Mg Tabs (Tramadol hcl) .... Take 1 tab by mouth three times a day for pain...  Patient Instructions: 1)  Today we updated your med list- see below.... 2)  We decided to treat your upper back pain  w/ rest HEAT, & a combination of anti-inflamm pain med (Tramadol) & muscle relaxer (Methocarbamol)- take the meds 3 times daily.Marland KitchenMarland Kitchen 3)  We will  XRay the area & please call the phone tree in a few days for your results.Marland KitchenMarland Kitchen 4)  Continue your other meds the same & be sure to get the Seasonal flu shot at work... 5)  Call for any questions.Marland KitchenMarland Kitchen 6)  Please schedule a follow-up appointment in 6 months. Prescriptions: TRAMADOL HCL 50 MG TABS (TRAMADOL HCL) take 1 tab by mouth three times a day for pain...  #90 x 6   Entered and Authorized by:   Noralee Space MD   Signed by:   Noralee Space MD on 04/11/2010   Method used:   Print then Give to Patient   RxID:   [id number] METHOCARBAMOL 500 MG TABS (METHOCARBAMOL) take 1 tab by mouth three times a day as needed for muscle spasm...  #90 x 6   Entered and Authorized by:   Noralee Space MD   Signed by:   Noralee Space MD on 04/11/2010   Method used:   Print then Give to Patient   RxID:   [id number]

## 2010-06-16 NOTE — Assessment & Plan Note (Signed)
Summary: f/u 6 months///kp   Primary Care Provider:  Teressa Lower MD  CC:  6 month ROV & review of mult medical problems....  History of Present Illness: 65  y/o BF here for a follow up visit... he has multiple medical problems as noted below...    ~  Jun/09:  she has seen DrAltheimer for a diabetic/ endocrine consult (see his note)- he made several changes which she has readily embraced including: change to Metformin & Byetta, adding Crestor, & changing Levothy to brand name Synthroid 40mcg/d...   ~  Oct09:  she continues to improve w/ DrAltheimer's regimen (note of 9/09 reviewed)... she has lost another 12#.Marland Kitchen.   ~  September 01, 2008:  she continues to see DrAltheimer every 2-3 months w/ all labs done by his office and by all accounts she is doing beautifully... no new complaints or concerns... she requests several refills to [facility] for 90d supplies...   ~  October 18.2010:  DrAltheimer's f/u notes of 4/10 & 7/10 are reviewed w/ pt... 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 she reports a good 6 mo- just c/o some foot cramps intermittently (she would like muscle relaxer & Robaxin written)... her weight remains at 200# & we discussed diet + exercise & the need to lose weight... she will get her 2010 flu shot at work...    Current Problem List:  ESSENTIAL HYPERTENSION - controlled on ASA 81mg /d, NORVASC 10mg - 1/2 tab, LISINOPRIL 20mg - 1/2 tab, & LASIX 20mg /d... not checking BP's at home but [facility], and BP=116/74 today... she denies HA, fatigue, visual changes, CP, palipit, dizziness, syncope, dyspnea, edema, etc...  ~  labs 10/24/07 by DrAltheimer OK- BUN= 21, Creat= 1.2  ~  labs 12/09 by DrA showed BUN= 15, Creat= 1.2, others OK.  ~  labs 7/10 by DrA showed BUN= 13, Creat= 1.1  Hx of DEEP VENOUS THROMBOPHLEBITIS, VENOUS  INSUFFICIENCY - she takes ASA 81mg /d, avoids sodium, elevates legs and wears support hose as needed...  HYPERCHOLESTEROLEMIA (ICD-272.0) - on CRESTOR 20mg - taking 1/2 tab per DrAltheimer...  ~  Bailey 6/09 showed TChol 202, TG 148, HDL 53, LDL 141... Crestor10 started.  ~  Hugo 9/09 on Crestor10 showed TChol 162, TG 143, HDL 49, LDL 96... Crestor incr to 20mg /d.  ~  FLP 12/09 on Cres20 showed TChol 94, TG 111, HDL 52, LDL 35... decr to 1/2 tab/d.  ~  FLP 7/10 by DrA on Cres10 showed TChol 121, TG 121, HDL70, LDL 47  DIABETES MELLITUS, TYPE II (ICD-250.00) - on METFORMIN 500mg Bid & BYETTA 11mcgBid per DrAlth... diet counselling and DM education thru his staff...  ~  labs 8/08 showed BS=134, HgA1c=5.6 (on Glucovance 250/1.25 qAM)...   ~  labs 2/09 showed BS= 203, HgA1c= 6.9(on MetforminER 500mg /d)... switched back to Glucovance.  ~  labs 6/09 showed BS= 123, HgA1c= 5.5.Marland KitchenMarland Kitchen DrA changed to Metformin/Byetta...  ~  labs 12/09 showed BS= 100, A1c= 5.9.Marland KitchenMarland Kitchen DrA kept eveything the same.  ~  labs 7/10 showed BS= 140, A1c= 5.8, Umicroalb= neg.  HYPOTHYROIDISM (ICD-244.9) - on SYNTHROID 5mcg/d...  ~  labs 7/08 showed TSH norm at 2.5.Marland KitchenMarland Kitchen TSH= 1.90 in Feb09...   ~  TSH 10/24/07= 3.56 & switched to Synthroid 55mcg/d by DrAlth...  ~  labs 12/09 by DrA showed TSH= 0.40  ~  labs 7/10 by DrA showed TSH=  1.01  REFLUX ESOPHAGITIS, IRRITABLE BOWEL SYNDROME, COLONIC POLYPS, ADENOMATOUS - Last colonoscopy 5/04 by DrStark - adenomatous polyp removed, +hem, f/u 5 yrs.  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  ~  labs 2/09 showed Vit B12 level = 599...  UTI'S, RECURRENT (ICD-599.0)  DEGENERATIVE DISC DISEASE, CERVICAL SPINE; LOW BACK PAIN, CHRONIC; & HYPERURICEMIA - Eval & Rx by [facility] 3/02 w/ multilevel DDD - C4-5 C5-6 C6-7;  conservative Rx & improved... Eval & Rx by DrKramer & Joie Bimler 9/07 - L2 compression & pelvic fx after  fall; subseq Rx w/ shots for Lumbar spondy & improved.  VITAMIN D DEFICIENCY (ICD-268.9)  ~  BMD 2/09 normal w/ TScores +2.4 in spine, and -0.7 in fem neck...  2/09 VitD level only 14 (30-90) & started on VitD 50K/wk...   ~  labs 6/09 by DrAlt showed Vit D level 31... rec- continue 50K/wk...  Hx of CARPAL TUNNEL SYNDROME, BILATERAL - Eval & Rx by [facility] & DrSypher in 2001 w/ wrist splints & improved.  Hx of PSEUDOTUMOR CEREBRI & PARESTHESIA - Eval & Rx by DrBrewington/Tyson & Gean Quint 1984 - treated w/ Diamox (headaches resolved).  ANXIETY, OTHER ISOLATED OR SPECIFIC PHOBIAS -  mult somatic complaints - Rx attempt w/ Cymbalta (no change)... she can't/ won't swallow big pills...  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  Hx of IRITIS (ICD-364.3) - Eval DrTyson 12/05 was neg.    Medications Prior to Update: 1)  Ecotrin Low Strength 81 Mg  Tbec (Aspirin) .Marland Kitchen.. 1 Tab Daily 2)  Amlodipine Besylate 10 Mg  Tabs (Amlodipine Besylate) .Marland Kitchen.. 1 Tab Daily 3)  Lisinopril 20 Mg  Tabs (Lisinopril) .Marland Kitchen.. 1 Tab Daily 4)  Furosemide 20 Mg  Tabs (Furosemide) .Marland Kitchen.. 1 Tab in Am 5)  Crestor 20 Mg Tabs (Rosuvastatin Calcium) .... Take 1 Tab By Mouth Once Daily.Marland KitchenMarland Kitchen 6)  Metformin Hcl 500 Mg  Tabs (Metformin Hcl) .... Take 1 Tab By Mouth Two Times A Day 7)  Byetta 10 Mcg Pen 10 Mcg/0.5ml Soln (Exenatide) .... Inject 62mcg Two Times A Day As Directed... 8)  Synthroid 88 Mcg  Tabs (Levothyroxine Sodium) .... Take 1 Tablet By Mouth Once A Day 9)  Allopurinol 300 Mg  Tabs (Allopurinol) .Marland Kitchen.. 1 Tab Daily 10)  Alprazolam 0.5 Mg  Tabs (Alprazolam) .Marland Kitchen.. 1 Tab Three Times A Day Prn 11)  Cyanocobalamin 1000 Mcg/ml Inj Soln (Cyanocobalamin) .Marland Kitchen.. 1000 Micrograms Subcutaneously Qmonth 12)  Vitamin D 50000 Unit  Caps (Ergocalciferol) .Marland Kitchen.. 1 By Mouth Every Week  Allergies (verified): 1)  ! Penicillin V Potassium (Penicillin V  Potassium) 2)  ! Toprol Xl (Metoprolol Succinate)  Comments:  Nurse/Medical Assistant: The patient's medications and allergies were reviewed with the patient and were updated in the Medication and Allergy Lists. 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  Risk Factors: Smoking Status: never (03/05/2008)  Past Medical History: ESSENTIAL HYPERTENSION (ICD-401.9) Hx of DEEP VENOUS THROMBOPHLEBITIS (ICD-453.40) VENOUS INSUFFICIENCY (ICD-459.81) HYPERCHOLESTEROLEMIA (ICD-272.0) DIABETES MELLITUS, TYPE II (ICD-250.00) HYPOTHYROIDISM (ICD-244.9) REFLUX ESOPHAGITIS (ICD-530.11) IRRITABLE BOWEL SYNDROME (ICD-564.1) COLONIC POLYPS, HX OF (ICD-V12.72) Hx of IRON DEFICIENCY  ANEMIA SECONDARY TO BLOOD LOSS (ICD-280.0) VITAMIN B12 DEFICIENCY (ICD-266.2) UTI'S, RECURRENT (ICD-599.0) DEGENERATIVE DISC DISEASE, CERVICAL SPINE (ICD-722.4) LOW BACK PAIN, CHRONIC (ICD-724.2) HYPERURICEMIA (ICD-790.6) VITAMIN D DEFICIENCY (ICD-268.9) Hx of CARPAL TUNNEL SYNDROME, BILATERAL (ICD-354.0) Hx of PSEUDOTUMOR CEREBRI (ICD-348.2) PARESTHESIA (ICD-782.0) ANXIETY (ICD-300.00) OTHER ISOLATED OR SPECIFIC PHOBIAS (ICD-300.29) OTHER SPECIFIED DISORDER OF SKIN (ICD-709.8) IRITIS (ICD-364.3)  Past Surgical History: Hysterectomy - yrs ago Oophorectomy - S/P Rt Oopherectomy by DrFore 9/03 (Ovarian Cyst + adhesions) Herniorrhaphy - Abd wall hernia repaired by [facility] 9/03 Anal Fissure surg - 1977 by DrMauriceLeBauer  Family History: Reviewed history from 11/04/2007 and no changes required. mother died age 58 father died age 22 from prostate cancer 1 sister--alive age 54  Social History: Reviewed history from 11/04/2007 and no changes required. 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    Vital Signs:  Patient profile:   65 year old female Height:      66 inches Weight:      198.38 pounds BMI:     32.14 O2 Sat:      99 % on Room air Temp:     98.0 degrees F oral Pulse rate:   82 / minute BP sitting:   116 / 74  (left arm) Cuff size:   regular  Vitals Entered By: Parke Poisson CNA (March 01, 2009 9:20 AM)  O2 Flow:  Room air CC: 6 month ROV & review of mult medical problems... Is Patient Diabetic? Yes  Comments Medications reviewed with patient Daytime contact number verified with patient. Parke Poisson CNA  March 01, 2009 9:22 AM    Physical Exam  Additional Exam:  WD, WN, 65 y/o BF in NAD... GENERAL:  Alert & oriented; pleasant & cooperative... HEENT:  [place]/AT, EOM-wnl, PERRLA, EACs-clear, TMs-wnl, NOSE-clear, THROAT-clear & wnl. NECK:  Supple w/ fairROM; no JVD; normal carotid impulses w/o bruits; no thyromegaly or nodules palpated; no lymphadenopathy. CHEST:  Clear to P & A; without wheezes/ rales/ or rhonchi. HEART:  Regular Rhythm; gr 1/6 SEM without rubs or gallops heard... ABDOMEN:  Obese, soft & nontender; + abd panniculus, normal bowel sounds; no organomegaly or masses detected. EXT: without deformities, mild arthritic changes; no varicose veins/ +venous insuffic/ no edema. NEURO:  CN's intact; no focal neuro deficits... DERM:  No lesions noted; no rash etc...     Impression &  Recommendations:  Problem # 1:  ESSENTIAL HYPERTENSION (ICD-401.9) BP controlled-  continue current meds. Her updated medication list for this problem includes:    Amlodipine Besylate 10 Mg Tabs (Amlodipine besylate) .Marland Kitchen... Take 1/2 tab by mouth once daily...    Lisinopril 20 Mg Tabs (Lisinopril) .Marland Kitchen... Take 1/2 tab by mouth once daily...    Furosemide 20 Mg Tabs (Furosemide) .Marland Kitchen... 1 tab in am  Problem # 2:  VENOUS INSUFFICIENCY (ICD-459.81) She knows to elim salt, elevate legs, wear support hose, etc...  Problem # 3:  HYPERCHOLESTEROLEMIA (ICD-272.0) Controlled on the Crestor 10mg /d... Her updated medication list for this problem includes:    Crestor 20 Mg Tabs (Rosuvastatin calcium) .Marland Kitchen... Take 1/2 tab by mouth once daily...  Problem #  4:  DIABETES MELLITUS, TYPE II (ICD-250.00) Good control on DrAltheimer's regimen... Her updated medication list for this problem includes:    Ecotrin Low Strength 81 Mg Tbec (Aspirin) .Marland Kitchen... 1 tab daily    Lisinopril 20 Mg Tabs (Lisinopril) .Marland Kitchen... Take 1/2 tab by mouth once daily...    Metformin Hcl 500 Mg Tabs (Metformin hcl) .Marland Kitchen... Take 1 tab by mouth two times a day    Byetta 10 Mcg Pen 10 Mcg/0.27ml Soln (Exenatide) ..... Inject 58mcg two times a day as directed...  Problem # 5:  HYPOTHYROIDISM (ICD-244.9) Stable on the Synthroid... Her updated medication list for this problem includes:    Synthroid 88 Mcg Tabs (Levothyroxine sodium) .Marland Kitchen... Take 1 tablet by mouth once a day  Problem # 6:  IRRITABLE BOWEL SYNDROME (ICD-564.1) GI is stable- no new probs...  Problem # 7:  DEGENERATIVE DISC DISEASE, CERVICAL SPINE (ICD-722.4) Stable-  on Vit D weekly per DrA...  Problem # 8:  MULT OTHER MEDICAL PROBLEMS AS NOTED>>> Rx for Robaxin musc relaxer... she'll get flu shot at work..  Complete Medication List: 1)  Ecotrin Low Strength 81 Mg Tbec (Aspirin) .Marland Kitchen.. 1 tab daily 2)  Amlodipine Besylate 10 Mg Tabs (Amlodipine besylate) .... Take 1/2 tab by mouth  once daily.Marland KitchenMarland Kitchen 3)  Lisinopril 20 Mg Tabs (Lisinopril) .... Take 1/2 tab by mouth once daily.Marland KitchenMarland Kitchen 4)  Furosemide 20 Mg Tabs (Furosemide) .Marland Kitchen.. 1 tab in am 5)  Crestor 20 Mg Tabs (Rosuvastatin calcium) .... Take 1/2 tab by mouth once daily.Marland KitchenMarland Kitchen 6)  Metformin Hcl 500 Mg Tabs (Metformin hcl) .... Take 1 tab by mouth two times a day 7)  Byetta 10 Mcg Pen 10 Mcg/0.101ml Soln (Exenatide) .... Inject 43mcg two times a day as directed... 8)  Synthroid 88 Mcg Tabs (Levothyroxine sodium) .... Take 1 tablet by mouth once a day 9)  Allopurinol 300 Mg Tabs (Allopurinol) .Marland Kitchen.. 1 tab daily 10)  Alprazolam 0.5 Mg Tabs (Alprazolam) .Marland Kitchen.. 1 tab three times a day prn 11)  Cyanocobalamin 1000 Mcg/ml Inj Soln (Cyanocobalamin) .Marland Kitchen.. 1000 micrograms subcutaneously qmonth 12)  Vitamin D 50000 Unit Caps (Ergocalciferol) .Marland Kitchen.. 1 by mouth every week 13)  Methocarbamol 500 Mg Tabs (Methocarbamol) .... Take 1 tab by mouth three times a day as needed for muscle spasm... 14)  Patient Not To Lift >10 Pounds Due To Back Condition...   Other Orders: Prescription Created Electronically [phone])  Patient Instructions: 1)  Today we updated your med list- see below.... 2)  Continue your current meds the same for now... 3)  We wrote a new perscription for a muscle relaxer to take as needed for the foot & leg cramps... 4)  Keep up the great work w/ your diabetic control!!! 5)  Call for any problems.Marland KitchenMarland Kitchen 6)  Please schedule a follow-up appointment in 6 months. Prescriptions: METHOCARBAMOL 500 MG TABS (METHOCARBAMOL) take 1 tab by mouth three times a day as needed for muscle spasm...  #90 x prn   Entered and Authorized by:   Noralee Space MD   Signed by:   Noralee Space MD on 03/01/2009   Method used:   Print then Give to Patient   RxID:   [id number]    Immunization History:  Influenza Immunization History:    Influenza:  historical (02/13/2008)

## 2010-06-16 NOTE — Procedures (Signed)
Summary: Colonoscopy   Colonoscopy  Procedure date:  05/20/2008  Findings:      Location:  [place].    Procedures Next Due Date:    Colonoscopy: 05/2013  COLONOSCOPY PROCEDURE REPORT  PATIENT:  Katie [name], Katie [name]  MR#:  [id number] BIRTHDATE:   03/06/1946   GENDER:   female  ENDOSCOPIST:   Pricilla Riffle. Fuller Plan, MD, [facility] Referred by:   PROCEDURE DATE:  05/20/2008 PROCEDURE:  Colonoscopy with biopsy and snare polypectomy ASA CLASS:   Class II INDICATIONS: 1) follow-up of polyp, adenomatous polyps, 09/2002  MEDICATIONS:    Fentanyl 75 mcg IV, Versed 7 mg IV  DESCRIPTION OF PROCEDURE:   After the risks benefits and alternatives of the procedure were thoroughly explained, informed consent was obtained.  Digital rectal exam was performed and revealed no abnormalities.   The LB [id number] endoscope was introduced through the anus and advanced to the cecum, which was identified by both the appendix and ileocecal valve, without limitations.  The quality of the prep was good, using MoviPrep.  The instrument was then slowly withdrawn as the colon was fully examined. <<PROCEDUREIMAGES>>          <<OLD IMAGES>>  FINDINGS:  A sessile polyp was found in the cecum. It was 3 mm in size. The polyp was removed using cold biopsy forceps.  A sessile polyp was found in the ascending colon. It was 4 mm in size. Polyp was snared without cautery. Retrieval was successful.  A sessile polyp was found in the proximal transverse colon. It was 3 mm in size. The polyp was removed using cold biopsy forceps.  A sessile polyp was found in the descending colon. It was 5 mm in size. Polyp was snared without cautery. Retrieval was successful. Otherwise normal exam.   Retroflexed views in the rectum revealed hemorrhoids, internal, moderate.  The time to cecum =  2.5  minutes. The scope was then withdrawn (time =  9.33  min) from the patient and the procedure completed.  COMPLICATIONS:   None  ENDOSCOPIC  IMPRESSION:  1) 3 mm sessile polyp in the cecum  2) 4 mm sessile polyp in the ascending colon  3) 3 mm sessile polyp in the proximal transverse colon  4) 5 mm sessile polyp in the descending colon  5) Hemorrhoids RECOMMENDATIONS:  1) await pathology results  REPEAT EXAM:   In 5 year(s) for Colonoscopy.   _______________________________ Pricilla Riffle. Fuller Plan, MD, [facility]    CC: Noralee Space MD      REPORT OF SURGICAL PATHOLOGY   Case #: [id number] Patient Name: Katie [name]. Office Chart Number:  [id number]   MRN: [id number] Pathologist: H. Lynnell Chad, MD DOB/Age  [birth year] (Age: 89)    Gender: F Date Taken:  05/20/2008 Date Received: 05/20/2008   FINAL DIAGNOSIS   ***MICROSCOPIC EXAMINATION AND DIAGNOSIS***   COLON, CECUM, ASCENDING, TRANSVERSE AND DESCENDING, POLYPS, BIOPSY:   - TUBULAR ADENOMA.  NO HIGH GRADE DYSPLASIA OR MALIGNANCY IDENTIFIED. (TWO  FRAGMENTS) - POLYPOID FRAGMENTS OF BENIGN COLONIC MUCOSA WITH PROMINENT INTRAMUCOSAL  LYMPHOID AGGREGATES.     COMMENT There is adenomatous epithelium having a predominantly tubular growth pattern consistent with a tubular adenoma if the biopsy is representative of the entire lesion. No high grade dysplasia or evidence of malignancy is identified. Clinical correlation is recommended.    gdt Date Reported:  05/21/2008     H. Lynnell Chad, MD *** Electronically Signed Out By [facility] ***      May 21, 2008 MRN: [id number]    [address] [place], [place]  [id number]    Dear Ms. Suttles,  I am pleased to inform you that the colon polyp(s) removed during your recent colonoscopy was (were) found to be benign (no cancer detected) upon pathologic examination.  I recommend you have a repeat colonoscopy examination in 5 years to look for recurrent polyps, as having colon polyps increases your risk for having recurrent polyps or even colon cancer in the future.  Should you develop new or  worsening symptoms of abdominal pain, bowel habit changes or bleeding from the rectum or bowels, please schedule an evaluation with either your primary care physician or with me.  Continue treatment plan as outlined the day of your exam.  Please call us if you are having persistent problems or have questions about your condition that have not been fully answered at this time.  Sincerely,  Ladene Artist MD [facility]  This letter has been electronically signed by your physician.    This report was created from the original endoscopy report, which was reviewed and signed by the above listed endoscopist.

## 2010-06-16 NOTE — Letter (Signed)
Summary: [facility]  [facility]   Imported By: Edmonia James 11/26/2008 15:50:16  _____________________________________________________________________  External Attachment:    Type:   Image     Comment:   External Document

## 2010-06-16 NOTE — Letter (Signed)
Summary: M D Altheimer,MD  M D Altheimer,MD   Imported By: Bubba Hales 08/04/2008 07:27:44  _____________________________________________________________________  External Attachment:    Type:   Image     Comment:   External Document

## 2010-06-16 NOTE — Progress Notes (Signed)
Summary: nos appt  Phone Note Call from Patient   Caller: [email or web address]  Call For: Kaedynce Tapp Summary of Call: In ref. to nos from 5/25 pt has appt sch for 5/31. Initial call taken by: Netta Neat,  Oct 07, 2009 9:42 AM

## 2010-06-16 NOTE — Progress Notes (Signed)
Summary: medication  Phone Note Call from Patient   Caller: Patient Call For: nadel Summary of Call: need refill for allopurinol 300mg  furosemide 20mg  .send to [facility]  for 3 month supply [id number] Initial call taken by: Gustavus Bryant,  April 16, 2009 10:15 AM  Follow-up for Phone Call        rx have been faxed to [facility] per pts request. Ramiro Harvest CMA  April 16, 2009 10:40 AM     Prescriptions: ALLOPURINOL 300 MG  TABS (ALLOPURINOL) 1 tab daily  #90 x 3   Entered by:   Ramiro Harvest CMA   Authorized by:   Noralee Space MD   Signed by:   Ramiro Harvest CMA on 04/16/2009   Method used:   Faxed to ...       [facility] (mail-order)             ,          Ph: [phone]       Fax: [phone]   RxID:   [id number] FUROSEMIDE 20 MG  TABS (FUROSEMIDE) 1 tab in AM  #90 x 3   Entered by:   Ramiro Harvest CMA   Authorized by:   Noralee Space MD   Signed by:   Ramiro Harvest CMA on 04/16/2009   Method used:   Faxed to ...       [facility] (mail-order)             ,          Ph: [phone]       Fax: [phone]   RxID:   [id number]

## 2010-06-16 NOTE — Letter (Signed)
Summary: F/U Endocrinology visit/[facility]  F/U Endocrinology visit/[facility]   Imported By: Phillis Knack 10/02/2008 13:10:50  _____________________________________________________________________  External Attachment:    Type:   Image     Comment:   External Document

## 2010-06-16 NOTE — Progress Notes (Signed)
Summary: req letter/ diabetes  Phone Note Call from Patient   Caller: Patient Call For: nadel Summary of Call: needs referral letter sent to dr. Legrand Como altheimer-endocrinologist. pt's nurse manager peggy Tamala Julian also called  req this as well. her # is [phone] x [phone].  Initial call taken by: Cooper Render,  August 15, 2007 9:22 AM  Follow-up for Phone Call        gave chart to rhonda for refferal to dr altheimer for endocrine consult Follow-up by: [place],  August 20, 2007 2:22 PM         Appended Document: req letter/ diabetes Appt scheduled with Dr. Elyse Hsu for June 11 at 9:00 for Labs then 10:00 for EKG and to see Dr. Elyse Hsu. Pt mailed appt card as well as LMOAM for pt to call to go over details. OV notes to be faxed to [phone]. Dr. Lenna Gilford aware.

## 2010-06-16 NOTE — Assessment & Plan Note (Signed)
Summary: fu 4-6 months///kp   Chief Complaint:  4 month ROV....  History of Present Illness: 65 y/o BF here for a follow up visit... he has multiple medical problems as noted below... since I saw her last on 2/19 she has seen DrAltheimer for a diabetic/ endocrine consult (see his note)- he made several changes which she has readily embraced including: change to Metformin & Byetta, Adding Crestor, & changing Levothy to brand name Synthroid 31mcg/d...    Current Problem List:  ESSENTIAL HYPERTENSION - controlled on CARDIZEM 180mg /d, NORVASC 10mg /d, LISINOPRIL 20mg /d, & LASIX 20mg /d... not checking BP's at home but [facility], and BP=120/78 today... she denies HA, fatigue, visual changes, CP, palipit, dizziness, syncope, dyspnea, edema, etc...  ~  labs 10/24/07 by DrAltheimer OK- BUN= 21, Creat= 1.2..Marland Kitchen  Hx of DEEP VENOUS THROMBOPHLEBITIS, VENOUS INSUFFICIENCY - she takes ASA 81mg /d, avoids sodium, elevates legs and wears support hose as needed...    DIABETES MELLITUS, TYPE II (ICD-250.00) - on METFORMIN 500mg Bid & BYETTA 5-79mcgBid per DrAlth... diet counselling and DM education thru his staff...  ~  labs 8/08 showed BS=134, HgA1c=5.6 (on Glucovance 250/1.25 qAM)...   ~  labs 2/09 showed BS= 203, HgA1c= 6.9(on MetforminER 500mg /d)... switched back to Glucovance.  ~  labs 10/24/07 showed BS= 123, HgA1c= 5.5.Marland KitchenMarland Kitchen DrAlth changed to Metformin/Byetta...  HYPOTHYROIDISM (ICD-244.9) - on LEVOTHYROID 7mcg/d...   ~  labs 7/08 showed TSH norm at 2.5.Marland KitchenMarland Kitchen TSH= 1.90 in Feb09...   ~  TSH 10/24/07= 3.56 & switched to Synthroid 73mcg/d by DrAlth...  REFLUX ESOPHAGITIS, IRRITABLE BOWEL SYNDROME, COLONIC POLYPS, ADENOMATOUS - Last colonoscopy 5/04 by DrStark - adenomatous polyp removed, +hem, f/u 5 yrs.  Hx of IRON DEFICIENCY ANEMIA SECONDARY TO BLOOD LOSS, VITAMIN B12 DEFICIENCY   UTI'S, RECURRENT (ICD-599.0)  DEGENERATIVE DISC DISEASE, CERVICAL SPINE; LOW BACK PAIN, CHRONIC; & HYPERURICEMIA -  Eval & Rx by [facility] 3/02 w/ multilevel DDD - C4-5 C5-6 C6-7;  conservative Rx & improved... Eval & Rx by DrKramer & Joie Bimler 9/07 - L2 compression & pelvic fx after fall; subseq Rx w/ shots for Lumbar spondy & improved.  ~  BMD 2/09 normal w/ TScores +2.4 in spine, and -0.7 in fem neck...  2/09 VitD level only 14 (30-90) & started on VitD 50K/wk...   Hx of CARPAL TUNNEL SYNDROME, BILATERAL - Eval & Rx by [facility] & DrSypher in 2001 w/ wrist splints & improved.  Hx of PSEUDOTUMOR CEREBRI & PARESTHESIA - Eval & Rx by DrBrewington/Tyson & Gean Quint 1984 - treated w/ Diamox (headaches resolved).  ANXIETY, OTHER ISOLATED OR SPECIFIC PHOBIAS -  mult somatic complaints - Rx attempt w/ Cymbalta (no change).  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  Hx of IRITIS (ICD-364.3) - Eval DrTyson 12/05 was neg.        Current Allergies (reviewed today): ! PENICILLIN V POTASSIUM (PENICILLIN V POTASSIUM) ! TOPROL XL (METOPROLOL SUCCINATE)  Past Medical History:        ESSENTIAL HYPERTENSION (ICD-401.9)    Hx of DEEP VENOUS THROMBOPHLEBITIS (ICD-453.40)    VENOUS INSUFFICIENCY (ICD-459.81)    HYPERCHOLESTEROLEMIA (ICD-272.0)    DIABETES MELLITUS, TYPE II (ICD-250.00)    HYPOTHYROIDISM (ICD-244.9)    REFLUX ESOPHAGITIS (ICD-530.11)    IRRITABLE BOWEL SYNDROME (ICD-564.1)    COLONIC POLYPS, ADENOMATOUS (ICD-211.3)    Hx of IRON DEFICIENCY ANEMIA SECONDARY TO BLOOD LOSS (ICD-280.0)    VITAMIN B12 DEFICIENCY (ICD-266.2)  UTI'S, RECURRENT (ICD-599.0)    DEGENERATIVE DISC DISEASE, CERVICAL SPINE (ICD-722.4)    LOW BACK PAIN, CHRONIC (ICD-724.2)    HYPERURICEMIA (ICD-790.6)    Hx of CARPAL TUNNEL SYNDROME, BILATERAL (ICD-354.0)    Hx of PSEUDOTUMOR CEREBRI (ICD-348.2)    PARESTHESIA (ICD-782.0)    ANXIETY (ICD-300.00)    OTHER ISOLATED OR SPECIFIC PHOBIAS (ICD-300.29)    OTHER SPECIFIED DISORDER OF SKIN  (ICD-709.8)    IRITIS (ICD-364.3)      Past Surgical History:    Hysterectomy - yrs ago    Oophorectomy - S/P Rt Oopherectomy by DrFore 9/03 (Ovarian Cyst + adhesions)    Herniorrhaphy - Abd wall hernia repaired by [facility] 9/03    Anal Fissure surg - 1977 by DrMauriceLeBauer   Family History:    mother died age 88    father died age 23 from prostate cancer    1 sister--alive age 66  Social History:    records specialist    married    no children    non smoker    no etoh    Review of Systems       The patient complains of weight gain.  The patient denies anorexia, fever, weight loss, vision loss, decreased hearing, hoarseness, chest pain, syncope, dyspnea on exertion, peripheral edema, prolonged cough, headaches, hemoptysis, abdominal pain, melena, hematochezia, severe indigestion/heartburn, hematuria, incontinence, muscle weakness, suspicious skin lesions, transient blindness, difficulty walking, depression, unusual weight change, abnormal bleeding, enlarged lymph nodes, and angioedema.     Vital Signs:  Patient Profile:   65 Years Old Female Weight:      221.13 pounds O2 Sat:      98 % O2 treatment:    Room Air Temp:     98.5 degrees F oral Pulse rate:   95 / minute BP sitting:   120 / 78  (right arm)  Vitals Entered By: Ramiro Harvest CMA (November 04, 2007 10:41 AM)             Is Patient Diabetic? Yes      Physical Exam  WD, WN, 65 y/o BF in NAD... GENERAL:  Alert & oriented; pleasant & cooperative. HEENT:  /AT, EOM-wnl, PERRLA, EACs-clear, TMs-wnl, NOSE-clear, THROAT-clear & wnl. NECK:  Supple w/ fair ROM; no JVD; normal carotid impulses w/o bruits; no thyromegaly or nodules palpated; no lymphadenopathy. CHEST:  Clear to P & A; without wheezes/ rales/ or rhonchi. HEART:  Regular Rhythm; gr 1/6 SEM without rubs or gallops heard... ABDOMEN:  Obese, soft & nontender; + abd panniculus, normal bowel sounds; no organomegaly or masses detected. EXT: without  deformities, mild arthritic changes; no varicose veins/ +venous insuffic/ no edema. NEURO:  CN's intact; no focal neuro deficits... DERM:  No lesions noted; no rash etc...       Impression & Recommendations:  Problem # 1:  ESSENTIAL HYPERTENSION (ICD-401.9) Assessment: Unchanged Controlled... same meds. Her updated medication list for this problem includes:    Cardizem Cd 180 Mg Cp24 (Diltiazem hcl coated beads) .Marland Kitchen... 1 tab daily    Amlodipine Besylate 10 Mg Tabs (Amlodipine besylate) .Marland Kitchen... 1 tab daily    Lisinopril 20 Mg Tabs (Lisinopril) .Marland Kitchen... 1 tab daily    Furosemide 20 Mg Tabs (Furosemide) .Marland Kitchen... 1 tab in am   Problem # 2:  DIABETES MELLITUS, TYPE II (ICD-250.00) Meds changed by DrAltheimer... she will f/u w/ him and see me in 23months...  The following medications were removed from the medication list:    Glucovance 1.25-250 Mg Tabs (  Glyburide-metformin) .Marland Kitchen... 1 by mouth two times a day  Her updated medication list for this problem includes:    Ecotrin Low Strength 81 Mg Tbec (Aspirin) .Marland Kitchen... 1 tab daily    Lisinopril 20 Mg Tabs (Lisinopril) .Marland Kitchen... 1 tab daily    Byetta 5 Mcg Pen 5 Mcg/0.72ml Soln (Exenatide) .Marland Kitchen... Take as directed    Metformin Hcl 500 Mg Tabs (Metformin hcl) .Marland Kitchen... Take 1 tab by mouth two times a day   Problem # 3:  HYPERCHOLESTEROLEMIA (ICD-272.0) Assessment: Unchanged Started on Crestor per DrAltheimer...  Her updated medication list for this problem includes:    Crestor 10 Mg Tabs (Rosuvastatin calcium) .Marland Kitchen... Take 1 tablet by mouth once a day   Problem # 4:  HYPOTHYROIDISM (ICD-244.9) Assessment: Unchanged Switched to Synthroid 9mcg/d by DrA... Her updated medication list for this problem includes:    Synthroid 88 Mcg Tabs (Levothyroxine sodium) .Marland Kitchen... Take 1 tablet by mouth once a day   Complete Medication List: 1)  Ecotrin Low Strength 81 Mg Tbec (Aspirin) .Marland Kitchen.. 1 tab daily 2)  Cardizem Cd 180 Mg Cp24 (Diltiazem hcl coated beads) .Marland Kitchen.. 1 tab  daily 3)  Amlodipine Besylate 10 Mg Tabs (Amlodipine besylate) .Marland Kitchen.. 1 tab daily 4)  Lisinopril 20 Mg Tabs (Lisinopril) .Marland Kitchen.. 1 tab daily 5)  Furosemide 20 Mg Tabs (Furosemide) .Marland Kitchen.. 1 tab in am 6)  Crestor 10 Mg Tabs (Rosuvastatin calcium) .... Take 1 tablet by mouth once a day 7)  Byetta 5 Mcg Pen 5 Mcg/0.75ml Soln (Exenatide) .... Take as directed 8)  Metformin Hcl 500 Mg Tabs (Metformin hcl) .... Take 1 tab by mouth two times a day 9)  Synthroid 88 Mcg Tabs (Levothyroxine sodium) .... Take 1 tablet by mouth once a day 10)  Allopurinol 300 Mg Tabs (Allopurinol) .Marland Kitchen.. 1 tab daily 11)  Alprazolam 0.5 Mg Tabs (Alprazolam) .Marland Kitchen.. 1 tab three times a day prn 12)  Cyanocobalamin 1000 Mcg/ml Inj Soln (Cyanocobalamin) .Marland Kitchen.. 1000 micrograms subcutaneously qmonth 13)  Vitamin D 50000 Unit Caps (Ergocalciferol) .Marland Kitchen.. 1 by mouth every week   Patient Instructions: 1)  today we updated your med list to reflect the changes and additions made by DrAltheimer... 2)  Your blood pressure is doing well- continue your same medications... 3)  Keep the salt out of your diet and elevate your legs when able- otherwise wear support hose to minimize any swelling... 4)  Please schedule a follow-up appointment in 4 months, we will recheck your VitD level at that time...   Prescriptions: ALLOPURINOL 300 MG  TABS (ALLOPURINOL) 1 tab daily  #90 x 3   Entered by:   Ramiro Harvest CMA   Authorized by:   Noralee Space MD   Signed by:   Ramiro Harvest CMA on 11/04/2007   Method used:   Print then Give to Patient   RxID:   [id number] AMLODIPINE BESYLATE 10 MG  TABS (AMLODIPINE BESYLATE) 1 tab daily  #90 x 3   Entered by:   Ramiro Harvest CMA   Authorized by:   Noralee Space MD   Signed by:   Ramiro Harvest CMA on 11/04/2007   Method used:   Print then Give to Patient   RxID:   [id number] LISINOPRIL 20 MG  TABS (LISINOPRIL) 1 tab daily  #90 x 3   Entered by:   Ramiro Harvest CMA   Authorized by:   Noralee Space MD    Signed by:   Ramiro Harvest CMA on 11/04/2007   Method  used:   Print then Give to Patient   RxID:   [id number] CARDIZEM CD 180 MG  CP24 (DILTIAZEM HCL COATED BEADS) 1 tab daily  #90 x 3   Entered by:   Ramiro Harvest CMA   Authorized by:   Noralee Space MD   Signed by:   Ramiro Harvest CMA on 11/04/2007   Method used:   Print then Give to Patient   RxIDNC:[id number]  ]

## 2010-06-16 NOTE — Progress Notes (Signed)
Summary: RX REQ  Medications Added CARTIA XT 180 MG CP24 (DILTIAZEM HCL COATED BEADS)                           Phone Note Call from Patient   Caller: Patient Call For: NADEL Summary of Call: PT Holly BP MEDS. SHE DOESN'T KNOW THE NAME OF MEDS-BUT SAYS IT'S THE GENERIC FOR WHAT SHE HAS BEEN TAKING. THERE IS SOME CONFUSION AS TO THE  STRENGTH. [facility] ON [address]. PT # [id number]. PATIENT'S CHART HAS BEEN REQUESTED Initial call taken by: Cooper Render,  April 08, 2007 1:43 PM  Follow-up for Phone Call        Pt aware that we have contacted her pharmacy ([facility] [address] [facility]) regarding her lisinopril and amlodipine. She is to call them before she goes to pick it up to make sure it is ready.  Follow-up by: Francesca Jewett CMA,  April 09, 2007 10:16 AM    New/Updated Medications: CARTIA XT 180 MG CP24 (DILTIAZEM HCL COATED BEADS)

## 2010-06-16 NOTE — Consult Note (Signed)
Summary: [facility]  [facility]   Imported By: Edmonia James 11/04/2007 13:19:26  _____________________________________________________________________  External Attachment:    Type:   Image     Comment:   External Document

## 2010-06-16 NOTE — Letter (Signed)
Summary: [facility]  [facility]   Imported By: Phillis Knack 02/01/2010 11:28:39  _____________________________________________________________________  External Attachment:    Type:   Image     Comment:   External Document

## 2010-06-16 NOTE — Progress Notes (Signed)
Summary: request to be seen today/ rash  Phone Note Call from Patient Call back at Home Phone [phone]   Caller: Patient Call For: nadel Summary of Call: pt requests to be seen today. c/o red blotches on her legs x 3 days. legs are "cool" to the touch, not warm. blotches are "flat" not raised. denies fever. no N or V. pt wants sn or tp to see her today.  Initial call taken by: Cooper Render, CNA,  Oct 06, 2009 8:47 AM  Follow-up for Phone Call        Pt c/o red blotches on both legs, worse on the right x 3 days. Pt staes blotches are not raised, denies pain, denies itching and they are not warm to the touch. Offered pt appt tomorrow Am with TP, but pt states she has had clots in the past and isc oncerned about waiting until tomorrow for appt. Please advise.[place] Bing CMA  Oct 06, 2009 9:38 AM   Additional Follow-up for Phone Call Additional follow up Details #1::        per SN---ok to add pt on today---this afternoon as the last pt--thanks Shelton  Oct 06, 2009 10:55 AM   called pt to offer appt today at 3:30, when husband ansewered phone he left me on hold and no one ever came to the phone, so i hung up will try pt back in a few minutes --called back and LMOM to call back so we can offer appt for this afternoon Additional Follow-up by: Aline,  Oct 06, 2009 11:04 AM    Additional Follow-up for Phone Call Additional follow up Details #2::    see phone note from 10-07-09. pt set for doppler study. Bing CMA  Oct 07, 2009 11:20 AM

## 2010-06-16 NOTE — Letter (Signed)
Summary: Patient Notice- Polyp Results  [facility]  [address] [place], [place] [address]   Phone: [phone]  Fax: [phone]        May 21, 2008 MRN: [id number]    [place], [place]  [address]    Dear Katie [name],  I am pleased to inform you that the colon polyp(s) removed during your recent colonoscopy was (were) found to be benign (no cancer detected) upon pathologic examination.  I recommend you have a repeat colonoscopy examination in 5 years to look for recurrent polyps, as having colon polyps increases your risk for having recurrent polyps or even colon cancer in the future.  Should you develop new or worsening symptoms of abdominal pain, bowel habit changes or bleeding from the rectum or bowels, please schedule an evaluation with either your primary care physician or with me.  Continue treatment plan as outlined the day of your exam.  Please call us if you are having persistent problems or have questions about your condition that have not been fully answered at this time.  Sincerely,  Ladene Artist MD [facility]  This letter has been electronically signed by your physician.

## 2010-06-16 NOTE — Assessment & Plan Note (Signed)
Summary: 6 months/apc   Primary Care Provider:  Teressa Lower MD  CC:  6 month ROV & review of mult medical problems....  History of Present Illness: 65 y/o [name] here for a follow up visit... he has multiple medical problems as noted below...    ~  Jun/09:  she has seen DrAltheimer for a diabetic/ endocrine consult (see his note)- he made several changes which she has readily embraced including: change to Metformin & Byetta, adding Crestor, & changing Levothy to brand name Synthroid 58mcg/d...   ~  Oct09:  she continues to improve w/ DrAltheimer's regimen (note of 9/09 reviewed)... she has lost another 12#.Marland Kitchen.   ~  Apr10:  she continues to see DrAltheimer every 2-3 months w/ all labs done by his office and by all accounts she is doing beautifully... no new complaints or concerns... she requests several refills to [facility] for 90d supplies...  ~  Oct10:  DrAltheimer's f/u notes of 4/10 & 7/10 are reviewed w/ pt... 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 she reports a good 6 mo- just c/o some foot cramps intermittently (she would like muscle relaxer & Robaxin written)... her weight remains at 200# & we discussed diet + exercise & the need to lose weight... she will get her 2010 flu shot at work...   ~  Oct 12, 2009:  she continues to follow up w/ DrAltheimer every 62mo w/ complete lab data... she has VI & recent ven dopplers were neg for DVT... she saw DrCabbell 3/11 w/ MRI of lumbar spine- signif degen changes at L4-5 L5-S1 & facet arthropathy> given 2 shots & improved... she notes decr hearing in left ear> assoc w/ cerumen impaction... also wants Alpraz refilled...    Current Problem List:  ESSENTIAL HYPERTENSION - controlled on ASA 81mg /d, NORVASC 10mg - 1/2 tab, LISINOPRIL 20mg - 1/2 tab, & LASIX 20mg /d... not checking BP's at home but [facility], and BP=116/74 today... she denies HA, fatigue, visual changes, CP, palipit, dizziness, syncope, dyspnea, edema, etc...  ~  labs 10/24/07 by DrAltheimer OK- BUN= 21, Creat= 1.2  ~  labs 12/09 by DrA showed BUN= 15, Creat= 1.2, others OK.  ~  labs 7/10 by DrA showed BUN= 13, Creat= 1.1  ~  labs 5/11 by DrA showed BUN= 16, Creat= 1.1  Hx of DEEP VENOUS THROMBOPHLEBITIS, VENOUS INSUFFICIENCY - she takes ASA 81mg /d, avoids sodium, elevates legs and wears support hose as needed...  ~  Lawson Fiscal Dopplers 5/11 = neg for DVT...  HYPERCHOLESTEROLEMIA (ICD-272.0) - on CRESTOR 20mg - taking 1/2 tab per DrAltheimer...  ~  [place] 6/09 showed TChol 202, TG 148, HDL 53, LDL 141... Crestor10 started.  ~  [place] 9/09 on Crestor10 showed TChol 162, TG 143, HDL 49, LDL 96... Crestor incr to 20mg /d.  ~  FLP 12/09 on Cres20 showed TChol 94, TG 111, HDL 52, LDL 35... decr to 1/2 tab/d.  ~  FLP 7/10 by DrA on Cres10 showed TChol 121, TG 121, HDL70, LDL 47  ~  FLP 5/11 by DrA on Cres10 showed TChol 127, TG 57, HDL 96, LDL 34  DIABETES MELLITUS, TYPE II (ICD-250.00) - on METFORMIN 500mg Bid & BYETTA 38mcgBid per DrAlth... diet counselling and DM education thru his staff...  ~  labs 8/08 showed BS=134, HgA1c=5.6 (on Glucovance 250/1.25 qAM)...   ~  labs 2/09 showed BS=  203, HgA1c= 6.9(on MetforminER 500mg /d)... switched back to Glucovance.  ~  labs 6/09 showed BS= 123, HgA1c= 5.5.Marland KitchenMarland Kitchen DrA changed to Metformin/Byetta...  ~  labs 12/09 showed BS= 100, A1c= 5.9.Marland KitchenMarland Kitchen DrA kept eveything the same.  ~  labs 7/10 showed BS= 140, A1c= 5.8, Umicroalb= neg.  ~  labs 5/11 by DrA showed BS= 130, A1c= 6.3  HYPOTHYROIDISM (ICD-244.9) - on SYNTHROID 89mcg/d...  ~  labs 7/08 showed TSH norm at 2.5.Marland KitchenMarland Kitchen TSH= 1.90 in Feb09...   ~  TSH 10/24/07= 3.56 & switched to Synthroid 19mcg/d by DrAlth...  ~  labs 12/09 by DrA showed TSH= 0.40  ~  labs 7/10 by DrA showed TSH= 1.01  ~  labs 5/11 by DrA showed TSH= 1.09  REFLUX ESOPHAGITIS, IRRITABLE BOWEL  SYNDROME, COLONIC POLYPS, ADENOMATOUS - Last colonoscopy 5/04 by DrStark - adenomatous polyp removed, +hem, f/u 5 yrs.  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  ~  labs 2/09 showed Vit B12 level = 599...  ~  labs by DrA 5/11 showed Vit B12 level = 478  UTI'S, RECURRENT (ICD-599.0) DEGENERATIVE DISC DISEASE, CERVICAL SPINE; LOW BACK PAIN, CHRONIC; & HYPERURICEMIA - Eval & Rx by [facility] 3/02 w/ multilevel DDD - C4-5 C5-6 C6-7;  conservative Rx & improved... Eval & Rx by DrKramer & Joie Bimler 9/07 - L2 compression & pelvic fx after fall; subseq Rx w/ shots for Lumbar spondy & improved.  VITAMIN D DEFICIENCY (ICD-268.9)  ~  BMD 2/09 normal w/ TScores +2.4 in spine, and -0.7 in fem neck...  2/09 VitD level only 14 (30-90) & started on VitD 50K/wk...   ~  labs 6/09 by DrAlt showed Vit D level 31... rec- continue 50K/wk...  Hx of CARPAL TUNNEL SYNDROME, BILATERAL - Eval & Rx by [facility] & DrSypher in 2001 w/ wrist splints & improved.  Hx of PSEUDOTUMOR CEREBRI & PARESTHESIA - Eval & Rx by DrBrewington/Tyson & Gean Quint 1984 - treated w/ Diamox (headaches resolved).  ANXIETY, OTHER ISOLATED OR SPECIFIC PHOBIAS -  mult somatic complaints - Rx attempt w/ Cymbalta (no change)... she can't/ won't swallow big pills...  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  Hx of IRITIS (ICD-364.3) - Eval DrTyson 12/05 was neg.    Allergies: 1)  ! Penicillin V Potassium (Penicillin V Potassium) 2)  ! Toprol Xl (Metoprolol Succinate)  Comments:  Nurse/Medical Assistant: The patient's medications and allergies were reviewed with the patient and were updated in the Medication and Allergy Lists.  Past History:  Past Medical History: ESSENTIAL HYPERTENSION (ICD-401.9) Hx of DEEP VENOUS THROMBOPHLEBITIS (ICD-453.40) VENOUS  INSUFFICIENCY (ICD-459.81) HYPERCHOLESTEROLEMIA (ICD-272.0) DIABETES MELLITUS, TYPE II (ICD-250.00) HYPOTHYROIDISM (ICD-244.9) REFLUX ESOPHAGITIS (ICD-530.11) IRRITABLE BOWEL SYNDROME (ICD-564.1) COLONIC POLYPS, HX OF (ICD-V12.72) Hx of IRON DEFICIENCY ANEMIA SECONDARY TO BLOOD LOSS (ICD-280.0) VITAMIN B12 DEFICIENCY (ICD-266.2) UTI'S, RECURRENT (ICD-599.0) DEGENERATIVE DISC DISEASE, CERVICAL SPINE (ICD-722.4) LOW BACK PAIN, CHRONIC (ICD-724.2) HYPERURICEMIA (ICD-790.6) VITAMIN D DEFICIENCY (ICD-268.9) Hx of CARPAL TUNNEL SYNDROME, BILATERAL (ICD-354.0) Hx of PSEUDOTUMOR CEREBRI (ICD-348.2) PARESTHESIA (ICD-782.0) ANXIETY (ICD-300.00) OTHER ISOLATED OR SPECIFIC PHOBIAS (ICD-300.29) OTHER SPECIFIED DISORDER OF SKIN (ICD-709.8) IRITIS (ICD-364.3)  Past Surgical History: Hysterectomy - yrs ago Oophorectomy - S/P Rt Oopherectomy by DrFore 9/03 (Ovarian Cyst + adhesions) Herniorrhaphy - Abd wall hernia repaired by [facility] 9/03 Anal Fissure surg - 1977 by DrMauriceLeBauer  Family History: Reviewed history from 11/04/2007  and no changes required. 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  The patient denies anorexia, fever, weight loss, weight gain, vision loss, hoarseness, chest pain, syncope, peripheral edema, prolonged cough, headaches, hemoptysis, abdominal pain, melena, hematochezia, severe indigestion/heartburn, hematuria, incontinence, muscle weakness, suspicious skin lesions, transient blindness, depression, unusual weight change, abnormal bleeding, enlarged lymph nodes, and angioedema.    Vital Signs:  Patient profile:   65 year old female Height:      66  inches Weight:      188 pounds BMI:     30.45 O2 Sat:      99 % on Room air Temp:     97.6 degrees F oral Pulse rate:   48 / minute BP sitting:   100 / 62  (left arm) Cuff size:   regular  Vitals Entered By: Elita Boone CMA (Oct 12, 2009 2:21 PM)  O2 Sat at Rest %:  98 O2 Flow:  Room air CC: 6 month ROV & review of mult medical problems... Is Patient Diabetic? Yes Pain Assessment Patient in pain? no      Comments no changes in meds today   Physical Exam  Additional Exam:  WD, WN, 65 y/o [name] in NAD... GENERAL:  Alert & oriented; pleasant & cooperative... HEENT:  [place]/AT, EOM-wnl, PERRLA, EACs-clear, TMs-wnl, NOSE-clear, THROAT-clear & wnl. NECK:  Supple w/ fairROM; no JVD; normal carotid impulses w/o bruits; no thyromegaly or nodules palpated; no lymphadenopathy. CHEST:  Clear to P & A; without wheezes/ rales/ or rhonchi. HEART:  Regular Rhythm; gr 1/6 SEM without rubs or gallops heard... ABDOMEN:  Obese, soft & nontender; + abd panniculus, normal bowel sounds; no organomegaly or masses detected. EXT: without deformities, mild arthritic changes; no varicose veins/ +venous insuffic/ no edema. NEURO:  CN's intact; no focal neuro deficits... DERM:  No lesions noted; no rash etc...    MISC. Report  Procedure date:  10/12/2009  Findings:      DATA REVIEWED:   ~  Recent OV & labs from DrAltheimer> Q25mo: 06/23/09 & 10/05/09...  ~  OV note from DrCabbell: 08/03/09...  ~  Ven Dopplers study 10/07/09...   Impression & Recommendations:  Problem # 1:  CERUMEN IMPACTION, LEFT (ICD-380.4) Refer to ENT for removal... Orders: ENT Referral (ENT)  Problem # 2:  ESSENTIAL HYPERTENSION (ICD-401.9) Controlled on meds>  continue same. Her updated medication list for this problem includes:    Amlodipine Besylate 10 Mg Tabs (Amlodipine besylate) .Marland Kitchen... Take 1/2 tab by mouth once daily...    Lisinopril 20 Mg Tabs (Lisinopril) .Marland Kitchen... Take 1/2 tab by mouth once daily...    Furosemide 20 Mg  Tabs (Furosemide) .Marland Kitchen... 1 tab in am  Problem # 3:  VENOUS INSUFFICIENCY (ICD-459.81) Aware>  recent Ven Doppler neg for DVT...  Problem # 4:  HYPERCHOLESTEROLEMIA (ICD-272.0) Controlled on Cres10 per DrAltheimer... Her updated medication list for this problem includes:    Crestor 20 Mg Tabs (Rosuvastatin calcium) .Marland Kitchen... Take 1/2 tab by mouth once daily...  Problem # 5:  DIABETES MELLITUS, TYPE II (ICD-250.00) Controlled on meds per DrAltheimer... Her updated medication list for this problem includes:    Ecotrin Low Strength 81 Mg  Tbec (Aspirin) .Marland Kitchen... 1 tab daily    Lisinopril 20 Mg Tabs (Lisinopril) .Marland Kitchen... Take 1/2 tab by mouth once daily...    Metformin Hcl 500 Mg Tabs (Metformin hcl) .Marland Kitchen... Take 1 tab by mouth two times a day    Byetta 10 Mcg Pen 10 Mcg/0.59ml Soln (Exenatide) ..... Inject 63mcg two times a day as directed...  Problem # 6:  HYPOTHYROIDISM (ICD-244.9) Controlled on Synthroid88... Her updated medication list for this problem includes:    Synthroid 88 Mcg Tabs (Levothyroxine sodium) .Marland Kitchen... Take 1 tablet by mouth once a day  Problem # 7:  IRRITABLE BOWEL SYNDROME (ICD-564.1) GI stable>  continue same Rx...  Problem # 8:  OTHER MEDICAL PROBLEMS AS LISTED IN HPI>>>  Complete Medication List: 1)  Ecotrin Low Strength 81 Mg Tbec (Aspirin) .Marland Kitchen.. 1 tab daily 2)  Amlodipine Besylate 10 Mg Tabs (Amlodipine besylate) .... Take 1/2 tab by mouth once daily.Marland KitchenMarland Kitchen 3)  Lisinopril 20 Mg Tabs (Lisinopril) .... Take 1/2 tab by mouth once daily.Marland KitchenMarland Kitchen 4)  Furosemide 20 Mg Tabs (Furosemide) .Marland Kitchen.. 1 tab in am 5)  Crestor 20 Mg Tabs (Rosuvastatin calcium) .... Take 1/2 tab by mouth once daily.Marland KitchenMarland Kitchen 6)  Metformin Hcl 500 Mg Tabs (Metformin hcl) .... Take 1 tab by mouth two times a day 7)  Byetta 10 Mcg Pen 10 Mcg/0.51ml Soln (Exenatide) .... Inject 55mcg two times a day as directed... 8)  Synthroid 88 Mcg Tabs (Levothyroxine sodium) .... Take 1 tablet by mouth once a day 9)  Allopurinol 300 Mg Tabs  (Allopurinol) .Marland Kitchen.. 1 tab daily 10)  Alprazolam 0.5 Mg Tabs (Alprazolam) .... Take 1/2 to 1 tab by mouth three times a day as needed for nerves... 11)  Cyanocobalamin 1000 Mcg/ml Inj Soln (Cyanocobalamin) .Marland Kitchen.. 1000 micrograms subcutaneously qmonth 12)  Vitamin D 50000 Unit Caps (Ergocalciferol) .Marland Kitchen.. 1 by mouth every week 13)  Methocarbamol 500 Mg Tabs (Methocarbamol) .... Take 1 tab by mouth three times a day as needed for muscle spasm... 14)  Cephalexin 250 Mg/22ml Susr (Cephalexin) .... Dosed by pharmacy four times daily x 5 days  Patient Instructions: 1)  Today we updated your med list- see below.... 2)  We refilled your Alprazolam per request... 3)  Continue your current meds the same.Marland Kitchen 4)  Call for any problems.Marland KitchenMarland Kitchen 5)  Please schedule a follow-up appointment in 6 months, sooner as needed... Prescriptions: ALPRAZOLAM 0.5 MG  TABS (ALPRAZOLAM) take 1/2 to 1 tab by mouth three times a day as needed for nerves...  #90 x 6   Entered and Authorized by:   Noralee Space MD   Signed by:   Noralee Space MD on 10/12/2009   Method used:   Print then Give to Patient   RxID:   [id number]    Immunization History:  Influenza Immunization History:    Influenza:  historical (04/07/2009)

## 2010-06-16 NOTE — Miscellaneous (Signed)
Summary: Orders Update  Clinical Lists Changes  Orders: Added new Test order of T-Bone Densitometry [phone]) - Signed  Appended Document: Orders Update bone density scheduled for 07/09/07@9 :00am pt aware

## 2010-06-16 NOTE — Letter (Signed)
Summary: Juanda Bond Altheimer MD  Juanda Bond Altheimer MD   Imported By: Bubba Hales 02/15/2010 07:45:33  _____________________________________________________________________  External Attachment:    Type:   Image     Comment:   External Document

## 2010-06-16 NOTE — Letter (Signed)
Summary: [facility]   Imported By: Phillis Knack 08/25/2009 11:52:17  _____________________________________________________________________  External Attachment:    Type:   Image     Comment:   External Document

## 2010-06-16 NOTE — Progress Notes (Signed)
Summary: Alprazolam refill  Phone Note Call from Patient Call back at Home Phone [phone]   Caller: Patient Call For: nadel Reason for Call: Talk to Nurse Summary of Call: pharmacy telling pt that her Alprazolam was never faxed back.  I am showing faxed on 08/26/2008. [facility] [place] Initial call taken by: Zigmund Gottron,  September 04, 2008 11:15 AM  Follow-up for Phone Call        Called [facility] and gave verbal order for Alprazolam refill x 1.  Refill auth done on 08/26/08 was never received by pharmacy.  Called spoke with pt advised rx refill called into pharmacy. Follow-up by: Freddrick March RN,  September 04, 2008 11:24 AM

## 2010-06-16 NOTE — Progress Notes (Signed)
Summary: REFILL for allopurinol  Phone Note Call from Patient Call back at [facility] Phone [phone]   Caller: Patient Call For: NADEL Summary of Call: ALOPERINOL TABS [place] Initial call taken by: Don Broach,  April 28, 2010 12:36 PM  Follow-up for Phone Call        pt needs refill of allopurinol for 90day supply. pt aware rx sent.[name] CMA  April 28, 2010 2:49 PM     Prescriptions: ALLOPURINOL 300 MG  TABS (ALLOPURINOL) 1 tab daily  #90 x 3   Entered by:    Bing CMA   Authorized by:   Noralee Space MD   Signed by:    Bing CMA on 04/28/2010   Method used:   Electronically to        [facility] (retail)       [address]       [place], [place]  [address]       Ph: [phone]       Fax: [phone]   RxID:   [id number]

## 2010-08-11 ENCOUNTER — Telehealth: Payer: Self-pay | Admitting: Pulmonary Disease

## 2010-08-11 ENCOUNTER — Other Ambulatory Visit: Payer: Self-pay | Admitting: Pulmonary Disease

## 2010-08-11 NOTE — Telephone Encounter (Signed)
Spoke with pt and notified of the above recs per Leigh and she verbalized understanding.

## 2010-08-11 NOTE — Telephone Encounter (Signed)
Looks like in SN last ov note that DR. Altheimer does follow pt for this and he follows all of her labs for this.  Ok for her to get these filled from him. Thanks

## 2010-08-11 NOTE — Telephone Encounter (Signed)
Pt requesting refill on Vit. D.  However, per documentation in EMR looks like pt is being followed by Dr. Elyse Hsu for this and we haven't filled this for her since 2009.  Please advise if SN ok to refill this or not? Thanks.

## 2010-08-29 LAB — GLUCOSE, CAPILLARY
Glucose-Capillary: 135 mg/dL — ABNORMAL HIGH (ref 70–99)
Glucose-Capillary: 157 mg/dL — ABNORMAL HIGH (ref 70–99)

## 2010-09-16 ENCOUNTER — Other Ambulatory Visit: Payer: Self-pay | Admitting: Neurosurgery

## 2010-09-16 DIAGNOSIS — M545 Low back pain, unspecified: Secondary | ICD-10-CM

## 2010-09-27 ENCOUNTER — Other Ambulatory Visit: Payer: Self-pay | Admitting: Pulmonary Disease

## 2010-09-27 NOTE — Assessment & Plan Note (Signed)
[facility]                             PULMONARY OFFICE NOTE   NAME:Katie [name], Katie [name]                           MRN:          [id number]  DATE:11/05/2006                            DOB:          1946/04/09    HISTORY OF PRESENT ILLNESS:  The patient is a 65 year old African  American female patient of Dr. Jeannine Kitten who has a known history of  hypertension, gastroesophageal reflux disease, diabetes mellitus and  hyperlipidemia who presents today for acute office visit.  The patient  complains that over the last three weeks she has had a mildly pruritic  rash along the forearms and lower extremities.  The patient has had  multiple raised bumps that are pruritic over the last two weeks that  have worsened over the last few days.  She denies any new medications.  She was recently prescribed Cymbalta one month ago, however, did not  start this prescription as of yet.  She has not changed any skin  products and denies any recent travel, known insect exposure, chest  pain, shortness of breath, difficult swallowing or shortness of breath.   PAST MEDICAL HISTORY:  Reviewed.   CURRENT MEDICATIONS:  Reviewed.   PHYSICAL EXAMINATION:  GENERAL:  The patient is an obese female in no  acute distress.  VITAL SIGNS:  Afebrile, stable vital signs.  O2 saturation 99% on room  air.  HEENT:  Posterior pharynx is clear.  NECK:  Supple without adenopathy.  No JVD.  LUNGS:  Sounds are clear.  CARDIAC:  Regular rate.  ABDOMEN:  Soft and nontender.  EXTREMITIES:  Warm without any calf tenderness, cyanosis, clubbing or  edema.  Along the bilateral forearms and lower extremities is a fine  raised maculopapular rash.  No excoriations or vesicles are noted.   IMPRESSION/PLAN:  Dermatitis, questionable etiology.  The patient was  given Zyrtec 10 mg at bedtime over the next two weeks.  Apply Topicort  0.05% cream twice daily over the next 7-10 days.  The patient will  return back  here in the office.  If symptoms do not improve, may need  referral to Dermatology.  The patient is to contact the office sooner  for sooner follow up if symptoms do not improve or worsen.      Rexene Edison, NP  Electronically Signed      Deborra Medina. Lenna Gilford, MD  Electronically Signed   TP/MedQ  DD: 11/05/2006  DT: 11/06/2006  Job #: [id number]

## 2010-09-30 ENCOUNTER — Ambulatory Visit
Admission: RE | Admit: 2010-09-30 | Discharge: 2010-09-30 | Disposition: A | Discharge status: Home / Self Care | Payer: 59 | Source: Ambulatory Visit | Attending: Neurosurgery | Admitting: Neurosurgery

## 2010-09-30 DIAGNOSIS — M545 Low back pain, unspecified: Secondary | ICD-10-CM

## 2010-09-30 NOTE — Discharge Summary (Signed)
NAME:  Katie [name], Katie [name]                            ACCOUNT NO.:  [id number]   MEDICAL RECORD NO.:  [id number]                   PATIENT TYPE:  INP   LOCATION:  [place]                                 FACILITY:  [facility]   PHYSICIAN:  Doran Heater. Fore, M.D.                DATE OF BIRTH:  Jan 29, 1946   DATE OF ADMISSION:  02/12/2002  DATE OF DISCHARGE:  02/18/2002                                 DISCHARGE SUMMARY   HISTORY OF PRESENT ILLNESS:  The patient is a 65 year old with persistent  right ovarian cyst, pelvic pain, and extensive ventral hernia who is  admitted on 02/12/02, for these problems.  The remainder of her history and  physical are as previously dictated.   LABORATORY DATA:  Preoperative hemoglobin 13.9, postoperative hemoglobin  ranged from 9.6 on 02/14/02, to a stable value of 8.4 on 02/17/02.  PTT  preoperatively was 35, on 02/14/02 was 56, on 02/15/02 was 57.  Preoperative  glucose was 129.  Urinalysis showed small leukocyte esterase, many bacteria,  but negative nitrites.  Chest x-ray showed mild cardiomegaly.  Electrocardiogram showed no change from previous electrocardiogram, but was  read as a borderline tracing and possible left ventricular hypertrophy.   HOSPITAL COURSE:  The patient was taken to the operating room on 02/12/02, at  which time exploratory laparotomy, extensive lysis of adhesions, right  salpingo-oophorectomy, and repair of ventral hernia by Dr. Alphonsa Overall was  performed.  The patient was placed on Lovenox to replace her Coumadin  immediately postoperatively.  She had oozing from her incision over several  days, such that the Lovenox was discontinued on 02/14/02.  Coumadin was  reinstituted, but again because of persistent bleeding and fall in  hematocrit and hemoglobin, the Coumadin was discontinued on 02/16/02.  She  had temperature elevations also in the range of 100.5 to 100.8 on 10/4 and  02/16/02, but became afebrile on 02/17/02.  Bowel activity was slow to  progress, but became normal prior to discharge.  On the morning of 02/18/02,  she was afebrile and experiencing only minimal pain.  Her hemoglobin had  been stable for greater than 24 hours, so it was felt that she could be  discharged at this time.   FINAL DIAGNOSES:  1. Pelvic and abdominal pain.  2. Persisting right ovarian cyst.  3. Extensive abdominal intraperitoneal pelvic adhesions.  4. Ventral hernia.   OPERATION:  Exploratory laparotomy, extensive lysis of adhesions, left  salpingo-oophorectomy, repair of ventral hernia.  Pathology report showed  benign serous cyst adenoma of the right ovary, and normal omental fat.   DISPOSITION:  Discharged home to return to the office in approximately three  days for staple removal.   ACTIVITY:  She was instructed to gradually progress her activities over  several weeks at home, and to limit lifting and driving for two weeks.   CONDITION ON DISCHARGE:  She was  fully ambulatory, on a reduced carbohydrate  regular diet, and in good condition at the time of discharge.   DISCHARGE MEDICATIONS:  1. Dilaudid 2 mg #30 one or two q.4-h. p.r.n. pain.  2.     Keflex 250 mg one q.i.d.  3. Trinsicon #30 one b.i.d. for improvement in her hemoglobin.  She will call for any problems.                                                Doran Heater. Warnell Forester, M.D.    SRF/MEDQ  D:  02/18/2002  T:  02/18/2002  Job:  [id number]   cc:   Fenton Malling. Lucia Gaskins, [place] [address] [place]  [place] [address]  Fax: [phone]   Deborra Medina. Lenna Gilford, M.D. [facility]

## 2010-09-30 NOTE — Discharge Summary (Signed)
[place]  Patient:    Katie [name], Katie [name]                         MRN: [id number] Adm. Date:  [id number] Disc. Date: [id number] Attending:  Marshia Ly CC:         Deborra Medina. Lenna Gilford, M.D. [place]. Warnell Forester, M.D.                           Discharge Summary  DATE OF BIRTH:  10/17/1945.  FINAL DIAGNOSES:  1. Right leg deep venous thrombosis - patient presented with swelling in right     leg over several days duration.  2. Small pulmonary embolus by VQ scan - patient had vague chest discomfort as     well.  3. Diabetes mellitus and obesity - on diet therapy.  4. Hypertension - controlled on medications.  5. History of low back pain and question of bone spur followed by Dr. Alfonso Ramus.  6. Gastroesophageal reflux disease with intermittent dysphagia for certain foods     and a "pill swallowing phobia" - patient questions most of her medicines for     consumption.  7. History of pseudotumor and cerebri - followed by Dr. Erling Cruz and Dr. Ricki Miller,     no recent headaches or visual symptoms noted.  8. History of anxiety.  9. History of fatigue. 10. History of mild anemia - started on iron therapy by Dr. Warnell Forester. 11. Question of hypothyroidism - started on Synthroid by Dr. Warnell Forester. 12. Status post hysterectomy for fibroids.  BRIEF HISTORY AND PHYSICAL:  The patient is a 65 year old black female who was admitted on July 14, 1999 with two day history of swelling in her right leg. She noted some vague right leg discomfort and mild swelling in the leg and presented to the emergency room for evaluation.  She denied actual pain in the leg but had some discomfort there.  There was no history of any known trauma and she had no prior history of DVT.  She did have a history of some superficial thrombophlebitis in  November of 2000 with negative Dopplers for DVT at that time.  There were no skin lesions, no evidence of cellulitis.  Patient denied  fevers, chills, sweats. She had not had any recent travel or periods of immobility.  There was no prior history of heart disease, congestive heart failure, etc.  Evaluation in the emergency room revealed about a 1 cm difference in circumference of the right leg greater than the left and venous Dopplers were positive for DVT.  Patient did note some vague chest discomfort and mild dyspnea.  PAST MEDICAL HISTORY:  Past history includes hypertension controlled on Prinizide and Toprol.  She had history of diet-controlled diabetes and is somewhat overweight.  She has a history of low back pain with question of bone spur followed by Dr. Alfonso Ramus of the orthopedic service.  She has a history of gastroesophageal  reflux disease with some dysphagia only with certain foods, but she has a total  swallowing phobia and has to crush most of her meds for consumption.  She has a  history of pseudotumor cerebri evaluated by Dr. Erling Cruz and Dr. Ricki Miller, she has had no recent headaches or blurry vision.  She has a history of some anxiety and has been on Xanax  in the past.  She has a history of fatigue and takes multivitamins.  She was recently evaluated by Dr. Jamal Maes and started on iron for anemia and thyroid for presumed hypothyroidism.  She has had a previous hysterectomy for fibroids in 1989 by Dr. Dorathy Kinsman.  She had a negative flexible  sigmoidoscopy in January of 1999 (except for a few hemorrhoids seen).  She had previous rectal fissure surgery in 1977.  PHYSICAL EXAMINATION:  Physical examination at the time of admission revealed a  healthy-appearing 65 year old black female in no acute distress.  Vital Signs:  Blood pressure 150/64, pulse 80 per minute and regular, respirations 20 per minute, and not particularly labored.  HEENT:  Unremarkable.  She has a slight strabismus. Neck exam showed no jugular venous distention.  Carotids 2+ without bruits. There is no thyromegaly or  lymphadenopathy.  Chest exam was clear to percussion and auscultation.  There were no wheezes, rales, or rhonchi heard.  The cardiac exam revealed a regular rhythm with grade 1/6 systolic ejection murmur at the left sternal border.  No rubs or gallops heard.  The abdomen was soft and nontender without evidence of organomegaly or masses.  Extremities showed some mild swelling in the right leg with a superficial venous pattern present.  There was no pain n dorsiflexion of the foot (negative Homans sign).  There did not appear to be much tenderness on squeeze test either.  Venous Dopplers in the emergency room did however reveal evidence of DVT in the right leg.  LABORATORY DATA:  EKG showed normal sinus rhythm with occasional PACs.  No acute changes noted.  A 2-D echo was performed and was essentially within normal limits; however, there was mild tricuspid regurge and right ventricular systolic pressure was slightly elevated in the 40-50 mm range consistent with some mild pulmonary  hypertension.  Chest x-ray revealed some mild cardiomegaly.  No evidence of congestive failure or acute lung process.  Venous Dopplers done in the emergency room were positive for DVT in the right leg.  A ventilation profusion lung scan  performed on July 14, 1999 showed a wedge-shaped profusion defect in the lateral aspect of the right upper lobe and possibly a second defect in the antral basilar segment of the left lower lobe compatible with pulmonary emboli.  CBC showed a hemoglobin of 11.2, hematocrit 31.1, white count 5200 with a normal differential.  Serial CBCs were followed throughout her hospital course. Hemoglobin remained in the 11.5-12 range and platelet counts remained normal. ro times were followed throughout her hospital course, especially after patient was started on Coumadin as were the PTTs by pharmacy protocol.  Sodium 140, potassium 4.7, chloride 106, CO2 26, BUN 17, creatinine  1.0, blood sugar 129.  Calcium 9.0. TSH 1.16.  HOSPITAL COURSE:  Patient was evaluated in the emergency room where venous Dopplers were performed by the vascular lab with findings of DVT in the right lower  extremity.  Specifically they found evidence of thrombus visualized in the calf  veins, popliteal vein, extending to the confluence of the superficial femoral vein in the profunda.  There was no evidence of thrombus in the common femoral vein,  etc.  Left leg appeared normal.  The patient was admitted for IV heparin and to  start Coumadin and coagulation.  This was done by pharmacy protocol and followed throughout her hospital course.  She had a VQ scan performed because of the vague chest discomfort and mild dyspnea and it was surprising to see  that she had two  small pulmonary emboli noted on the scan.  In addition her 2-D echo showed evidence of some mild increase in right-sided heart pressures.  The patient did well on he IV heparin and Coumadin therapy.  Swelling diminished in her leg as she kept it  elevated during the first several hospital days.  She was then allowed to dangle her leg and ambulate about the room.  She denied any leg discomfort, chest pain, or dyspnea in the hospital.  While she was here she was seen by nutritional therapy, instructed on a 1200 calorie ADA, 4 g sodium, no added salt diet by the care management team and pharmacy and given Coumadin teaching.  Her pro time gradually prolonged and was satisfactory for discharge on July 19, 1999.  CONDITION AT DISCHARGE:  Improved.  MEDICATIONS AT DISCHARGE: 1. Coumadin 5 mg tablets, 1-1/2 tablets daily any afternoon.  She will take    this medication for the next three days and come to the office for a follow-up    appointment and to get established in the Coumadin clinic. 2. She was instructed to continue her home medications, including Prinizide 20/12.5    one tablet p.o. q.d. 3. Toprol XL 50  one tablet p.o. q.d. 4. Iron tablets one a day per Dr. Warnell Forester. 5. Synthroid per Dr. Warnell Forester. 6. Premarin per Dr. Warnell Forester.  ACTIVITY:  She was instructed to gradually increase her activities this week but will not return to work until after we have seen her back in the office and established her in the Coumadin clinic. DD:  07/19/99 TD:  07/19/99 Job: [id number]

## 2010-09-30 NOTE — Op Note (Signed)
   NAME:  Katie [name], Katie [name]                            ACCOUNT NO.:  [id number]   MEDICAL RECORD NO.:  [id number]                   PATIENT TYPE:  AMB   LOCATION:  [facility]                                 FACILITY:  [facility]   PHYSICIAN:  Fenton Malling. Lucia Gaskins, MD                 DATE OF BIRTH:  1945/08/30   DATE OF PROCEDURE:  02/12/2002  DATE OF DISCHARGE:                                 OPERATIVE REPORT   PREOPERATIVE DIAGNOSES:  1. Abdominal wall incisional hernia.  2. Right ovarian cyst.   POSTOPERATIVE DIAGNOSES:  1. A 3-4 cm abdominal wall hernia to the left of the umbilicus.  2. Right ovarian cyst.   PROCEDURES:  1. Right oophorectomy, operated and dictated by Doran Heater. Fore, M.D.  2. Repair of abdominal wall hernia.   SURGEONS:  Fenton Malling. Lucia Gaskins, M.D.   ASSISTANT:  Doran Heater. Warnell Forester, M.D.   ANESTHESIA:  General endotracheal.   ESTIMATED BLOOD LOSS:  Minimal.   INDICATION FOR PROCEDURE:  The patient is a 65 year old black female who has  an abdominal wall hernia.  She is also undergoing abdominal exploration for  removal of a right ovarian cyst/mass seen by Dr. Warnell Forester.  I am going to assist  him with the right oophorectomy, but he is going to assist me with the  abdominal wall hernia repair.   DESCRIPTION OF PROCEDURE:  The patient was under anesthesia.  We completed  the right oophorectomy.  We had gone through a lower abdominal incision.  I  did have to extend the incision up approximately 8-10 cm to incorporate the  approximately 4-5 cm abdominal wall defect, which I think is a hernia in the  upper aspect of her old incision.  I do not think this is an umbilical  hernia.  I excised the sac, freshened up the edges.  Actually the hernia  defect ended up being kind of a hockey puck defect which goes to the left of  umbilicus.  We then irrigated out the abdominal cavity.  I then closed the  upper aspect of the abdomen with interrupted #1 Novofil sutures.   I then helped  Dr. Warnell Forester close  the remainder of the wound with two running #1  PDS sutures.  We then irrigated the wound and stapled the skin.                                               Fenton Malling. Lucia Gaskins, MD    DHN/MEDQ  D:  02/12/2002  T:  02/12/2002  Job:  [id number]   cc:   Doran Heater. Fore, M.D.  [phone] [address] [place]  [place] [address]  Fax: [phone]   Deborra Medina. Lenna Gilford, M.D. [facility]

## 2010-09-30 NOTE — H&P (Signed)
NAME:  Katie [name], Katie [name] NO.:  [id number]   MEDICAL RECORD NO.:  [id number]                    PATIENT TYPE:   LOCATION:                                       FACILITY:   PHYSICIAN:  Doran Heater. Fore, M.D.                DATE OF BIRTH:   DATE OF ADMISSION:  02/12/2002  DATE OF DISCHARGE:                                HISTORY & PHYSICAL   CHIEF COMPLAINT:  Pain, ovarian cysts, ventral hernia.   HISTORY OF PRESENT ILLNESS:  The patient is a 65 year old status post  hysterectomy in November of 1989 for fibroids.  She has been followed in our  office since 1996 for annual examinations.  She has also seen Dr. Teressa Lower for various problems including hypertension and carpal tunnel  problems.  She was found on ultrasound to have ovarian cysts and a ventral  hernia which has become more symptomatic over the past several months. These  were diagnosed in December of 2002 and January 2003.  She has been seen in  consultation by Dr. Alphonsa Overall who agreed that repair of the ventral  hernia was indicated because of the incarcerated loop of colon which was  present within the hernia.  She is admitted at this time for exploratory  laparotomy, bilateral salpingo-oophorectomy, possible lysis of adhesions,  and repair of ventral hernia.  She has been fully counseled as to the nature  of the procedure and the risks involved to include risks of anesthesia,  injury to bowel, bladder, blood vessels, ureters, postoperative hemorrhage,  infection, recuperation.  She fully understands all these considerations and  wishes to proceed on February 12, 2002.   PAST MEDICAL HISTORY:  This includes the above noted problems.   MEDICATIONS:  1. Toprol XL 100 daily.  2. Coumadin at varying amounts for anticoagulation.  3. Levothroid 75 mcg daily.   ALLERGIES:  She is allergic to PENICILLIN.   SOCIAL HISTORY:  She is a nonsmoker.   REVIEW OF SYSTEMS:  HEENT:  Negative.   CARDIORESPIRATORY:  Negative.  GASTROINTESTINAL:  She has had epigastric pain with bloating and  constipation with the present illness.  GENITOURINARY:  As noted above.  NEUROMUSCULAR:  Negative.   PHYSICAL EXAMINATION:  GENERAL APPEARANCE:  A well-developed black female in  no acute distress.  VITAL SIGNS:  Height 5 feet 5 inches, weight 230 pounds.  Blood pressure  128/76, respiratory rate 18.  HEENT:  Within normal limits.  NECK:  Supple without masses, adenopathy or bruits.  LUNGS:  Clear to percussion and auscultation.  CARDIOVASCULAR:  Regular rate and rhythm without murmurs.  BREASTS:  Her breasts were examined sitting and lying without mass.  ABDOMEN:  Soft with tender epigastric area to the right lower quadrant and  defect in the ventral area above the umbilicus.  PELVIC:  External genitalia, Bartholin's, urethra and Skene's glands  within  normal limits.  Cervix and uterus are previously surgically absent.  Adnexa  reveals no palpable mass but some tenderness right greater than left.  Rectovaginal examination is confirmatory.  EXTREMITIES:  Within normal limits.  NEUROLOGIC:  Central nervous system grossly intact.  SKIN:  There are no suspicious lesions.   IMPRESSION:  1. Persisting ovarian cyst.  2. Ventral hernia.   DISPOSITION:  Admit for exploratory laparotomy, bilateral salpingo-  oophorectomy, and  repair of ventral hernia.                                                Doran Heater. Warnell Forester, M.D.    SRF/MEDQ  D:  02/10/2002  T:  02/10/2002  Job:  [id number]   cc:   Fenton Malling. Lucia Gaskins, [place] [address], [address] [place]  [place] [address]  Fax: [phone]

## 2010-09-30 NOTE — Op Note (Signed)
NAME:  CORY, SZYMANOWSKI                            ACCOUNT NO.:  [id number]   MEDICAL RECORD NO.:  [id number]                   PATIENT TYPE:  OBV   LOCATION:  [place]                                 FACILITY:  [facility]   PHYSICIAN:  Doran Heater. Fore, M.D.                DATE OF BIRTH:  October 14, 1945   DATE OF PROCEDURE:  02/12/2002  DATE OF DISCHARGE:                                 OPERATIVE REPORT   PREOPERATIVE DIAGNOSES:  1. Pelvic pain and abdominal pain.  2. Persistent right ovarian cyst.  3. Extensive ventral hernia lateral to the umbilicus to the left.   POSTOPERATIVE DIAGNOSES:  1. Pelvic pain and abdominal pain.  2. Persistent right ovarian cyst.  3. Extensive ventral hernia lateral to the umbilicus to the left.   Pending pathology.   PROCEDURES:  1. Exploratory laparotomy.  2. Extensive lysis of adhesions.  3. Repair of ventral hernia, which was performed by Fenton Malling. Lucia Gaskins, M.D.,     and who will dictate that portion.   ANESTHESIA:  General orotracheal.   SURGEON:  Doran Heater. Warnell Forester, M.D.   ASSISTANT:  Fenton Malling. Lucia Gaskins, M.D.   ESTIMATED BLOOD LOSS:  150 ml.   INDICATION FOR PROCEDURE:  The patient is a 65 year old with the above-noted  problems, who was counseled as to the need for surgery to treat this  problem.  She was fully counseled as to the nature of the procedure and the  risks involved, particularly the risks of anesthesia, injury to bowel, blood  vessels, ureters, postoperative hemorrhage, infection, and recuperation.  She fully understands all these considerations and wishes to proceed on  02/21/02.   OPERATIVE FINDINGS:  On entry into the abdomen through a previous midline  incision, there were adhesions which were quite dense involving the omentum  to the anterior peritoneal surface.  Adhesions involving multiple loops of  bowel, to include small and large intestine, were present in the pelvis and  overlying the right adnexal structures.  There was a hernia  present lateral  to the umbilicus to the left, which was quite extensive as well.  This was  approximately 5-6 cm in greatest dimensions.   DESCRIPTION OF PROCEDURE:  With the patient under general anesthesia, she  was prepared and draped in the usual sterile fashion with Foley catheter in  the bladder and anticoagulation stockings on.  A lower abdominal vertical  incision was made after excising a previous vertical scar.  The incision was  carried into the peritoneal cavity without difficulty.  Small bleeders were  rendered hemostatic with Bovie electrocoagulation.  It was possible to enter  the peritoneum without difficulty.  The areas of adhesions were lysed using  sharp dissection and Bovie electrocoagulation for hemostasis.  This allowed  a self-retaining retractor to be placed.  The bowel was then packed off, and  extensive careful dissection and transection of  adhesions in the pelvis were  carried out.  This was quite a length portion of the procedure, requiring  approximately half an hour to accomplish.  Eventually, however, the bowel  adhesions were lysed and the cystic ovary on the right was identified.  Entry into the retroperitoneal space was then performed to identify the  ureter and vessels underlying the infundibulopelvic ligament area.  The  ureter was noted to be well below the area of dissection.  The  infundibulopelvic ligament was then isolated, clamped, cut, and doubly  ligated with 1 chromic catgut.  The ovary with the cyst and portion of the  right tube was then excised using Bovie electrocoagulation to free adhesions  which were present on the lateral and medial peritoneal surfaces.  Small  bleeders were rendered hemostatic with Bovie electrocoagulation.  The area  was observed for several minutes to assure that hemostasis was maintained.  After noting that this was the case, a pack was placed in this area and  attention was directed to the ventral hernia repair.   This portion of the  procedure has been dictated by Dr. Lucia Gaskins.  After repair of the ventral  hernia, the pelvis was lavaged with copious amounts of lactated Ringer's  solution and again observed for hemostasis.  Small bleeders were rendered  hemostatic with Bovie electrocoagulation. After noting that hemostasis was  maintained and that sponge and instrument counts were correct x2, the  remaining portion of the fascia was closed with a continuous suture of #1  PDS.  This reapproximated the peritoneal surfaces well.  Each layer was  lavaged with copious amounts of lactated Ringer's solution prior to full  closure.  After noting that hemostasis was maintained in the subcutaneous  fat layer, the skin was closed with skin clips.  The patient was taken to  the recovery room in good condition with clear urine in the Foley catheter  tubing and after placement of an abdominal binder.  She will be placed on 23-  hour observation following surgery.                                               Doran Heater. Warnell Forester, M.D.    SRF/MEDQ  D:  02/12/2002  T:  02/12/2002  Job:  [id number]   cc:   Fenton Malling. Lucia Gaskins, [place] [address], [address] [place]  [place] [address]  Fax: [phone]

## 2010-10-04 ENCOUNTER — Encounter: Payer: Self-pay | Admitting: Pulmonary Disease

## 2010-10-11 ENCOUNTER — Ambulatory Visit: Payer: Self-pay | Admitting: Pulmonary Disease

## 2010-11-30 ENCOUNTER — Ambulatory Visit (INDEPENDENT_AMBULATORY_CARE_PROVIDER_SITE_OTHER): Payer: 59 | Admitting: Pulmonary Disease

## 2010-11-30 ENCOUNTER — Encounter: Payer: Self-pay | Admitting: Pulmonary Disease

## 2010-11-30 DIAGNOSIS — M545 Low back pain, unspecified: Secondary | ICD-10-CM

## 2010-11-30 DIAGNOSIS — I10 Essential (primary) hypertension: Secondary | ICD-10-CM

## 2010-11-30 DIAGNOSIS — E7800 Pure hypercholesterolemia, unspecified: Secondary | ICD-10-CM

## 2010-11-30 DIAGNOSIS — I872 Venous insufficiency (chronic) (peripheral): Secondary | ICD-10-CM

## 2010-11-30 DIAGNOSIS — M549 Dorsalgia, unspecified: Secondary | ICD-10-CM

## 2010-11-30 DIAGNOSIS — M5030 Other cervical disc degeneration, unspecified cervical region: Secondary | ICD-10-CM

## 2010-11-30 DIAGNOSIS — G932 Benign intracranial hypertension: Secondary | ICD-10-CM

## 2010-11-30 DIAGNOSIS — E119 Type 2 diabetes mellitus without complications: Secondary | ICD-10-CM

## 2010-11-30 DIAGNOSIS — E039 Hypothyroidism, unspecified: Secondary | ICD-10-CM

## 2010-11-30 DIAGNOSIS — G629 Polyneuropathy, unspecified: Secondary | ICD-10-CM

## 2010-11-30 DIAGNOSIS — R209 Unspecified disturbances of skin sensation: Secondary | ICD-10-CM

## 2010-11-30 DIAGNOSIS — F411 Generalized anxiety disorder: Secondary | ICD-10-CM

## 2010-11-30 DIAGNOSIS — R42 Dizziness and giddiness: Secondary | ICD-10-CM

## 2010-11-30 MED ORDER — ALPRAZOLAM 0.5 MG PO TABS: 0.5000 mg | ORAL_TABLET | Freq: Three times a day (TID) | ORAL | Status: DC | PRN

## 2010-11-30 NOTE — Progress Notes (Signed)
Subjective:    Patient ID: Katie [name], female    DOB: 09/23/1945, 65 y.o.   MRN: [id number]  HPI 65 y/o BF here for a follow up visit... he has multiple medical problems including HBP;  Hyperchol, DM, & Hypothyroid followed by DrAltheimer;  Neck & Back pain followed by DrCabbell & DrKramer;  Osteopenia & Vit D defic;  Anemia & Vit B12 defic...   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 she reports a good 6 mo- just c/o some foot cramps intermittently (she would like muscle relaxer & Robaxin written)... her weight remains at 200# & we discussed diet + exercise & the need to lose weight... she will get her 2010 flu shot at work...  ~  Oct 12, 2009:  she continues to follow up w/ DrAltheimer every 42mo w/ complete lab data... she has VI & recent ven dopplers were neg for DVT... she saw DrCabbell 3/11 w/ MRI of lumbar spine- signif degen changes at L4-5 L5-S1 & facet arthropathy> given 2 shots & improved... she notes decr hearing in left ear> assoc w/ cerumen impaction... also wants Alpraz refilled...  ~  April 11, 2010:  she is c/o incr back pain in the TSpine area for the past month (XRay today shows sl scoliosis & mild spondylosis, NAD)... we discussed Rx w/ Tramadol & Robaxin, rest & heat... she saw DrCabbell 8/11 for lumbar back pain & his note is reviewed> he did a series of lumbar injections & ordered EMG/ NCV to check for DM neuropathy (?results)...  she continues to see DrAltheimer every 75mo w/ complete labs done at every visit> his note of 02/07/10 is reviewed w/ the pt...  ~  November 30, 2010:  51mo ROV & she is stable overall, requesting refill Xanax & renewal of handicap sticker;  BP controlled on meds;  She continues to f/u w/ DrAltheimer every 3-4 months w/ extensive labs each visit & his notes are reviewed> DM, Chol, Thyroid, etc (See  prob list below)...    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 In light of her concern for the dizziness & her mult risk factors- we will refer to Neuro to eval her stroke risk, continue ASA 81mg /d, & we reviewed EPLEY maneuver for BPPV in the meanwhile...   Problem List:  ESSENTIAL HYPERTENSION - controlled on ASA 81mg /d, NORVASC 10mg - 1/2 tab, LISINOPRIL 20mg - 1/2 tab, & LASIX 20mg /d... not checking BP's at home but [facility], DrAltheimers office, and BP=124/78 today... she denies HA, fatigue, visual changes, CP, palipit, dizziness, syncope, dyspnea, edema, etc...  Hx of DEEP VENOUS THROMBOPHLEBITIS, VENOUS INSUFFICIENCY - she takes ASA 81mg /d, avoids sodium, elevates legs and wears support hose as needed... ~  Lawson Fiscal Dopplers 5/11 = neg for DVT...  HYPERCHOLESTEROLEMIA (ICD-272.0) - on CRESTOR 20mg - taking 1/2 tab per DrAltheimer & well controlled> he does her full labs every 79months... his latest note & labs from his office reviewed w/ the  pt... ~  FLP 12/11 from DrAltheimer on [address] showed TChol 137, TG 105, HDL 87, LDL 51  DIABETES MELLITUS, TYPE II (ICD-250.00) - on METFORMIN 500mg Bid (takes liq Riomet) & BYETTA 21mcgBid per DrAltheimer w/ good control & A1c's around 6.0 (and wt loss of 35# from the Byetta)... diet counselling and DM education thru his staff and full labs checked every 3 months as noted... She sees DrShapiro for eyes (no retinopathy), & DrAljouny for feet (monofilament normal), no known renal dis... ~  Labs 12/11 from DrAltheimer on Metform+Byetta showed BS=131, A1c=6.3  HYPOTHYROIDISM (ICD-244.9) - on SYNTHROID 20mcg/d & labs monitored every  54months by DrAltheimer> stable on this dose w/ TSH~1.0 range... ~  Labs 12/11 from DrAltheimer on Synth88 showed TSH= 0.22  REFLUX ESOPHAGITIS, IRRITABLE BOWEL SYNDROME, COLONIC POLYPS, ADENOMATOUS -  ~  colonoscopy 5/04 by DrStark - adenomatous polyp removed, +hems... ~  f/u colonoscopy 1/10 by DrStark w/ hems & several polyps removed- adenomatous, f/u planned 2yrs.  Hx of IRON DEFICIENCY ANEMIA SECONDARY TO BLOOD LOSS &  VITAMIN B12 DEFICIENCY - on B12 shots 1042mcg Sq monthly given by city nurse at work... he does B12 levels on her as well every 3 months... ~  Labs 12/11 from DrAltheimer's office showed Vit B12 level = 51  UTI'S, RECURRENT (ICD-599.0) - she is followed by DrMezer for Gyn & she reports neg PAPs... he sent her to DrMacDiarmid for Urology 9/10 for pyuria/ proteinuria/ sm angiomyolipoma upper pole left kidney (otherw neg upper & lower track eval by Urology).  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 Eval & Rx by [facility] 3/02 w/ multilevel DDD - C4-5 C5-6 C6-7;  conservative Rx & improved... Eval & Rx by DrKramer & Joie Bimler 9/07 - L2 compression & pelvic fx after fall... Treated w/ series Lumbar epidural steroid shots & improved... ~  we have given her TRAMADOL 50mg  Tid as needs & ROBAXIN 500mg  Tid Prn... ~  Labs 12/11 from DrAltheimer's office on [place] showed Uric=4.1  VITAMIN D DEFICIENCY (ICD-268.9) - on Vit D 50000u weekly... ~  BMD 2/09 normal w/ TScores +2.4 in spine, and -0.7 in fem neck...  2/09 VitD level only 14 (30-90) & started on VitD 50K/wk...  ~  labs 5/11 by DrAltheimer showed Vit D level 53... rec- continue 50K/wk & he continues to check her levels every 3-6 months.  Hx of CARPAL TUNNEL SYNDROME, BILATERAL - Eval & Rx by [facility] & DrSypher in 2001 w/ wrist splints & improved.  Hx of PSEUDOTUMOR CEREBRI & PARESTHESIA - Eval & Rx by DrBrewington/Tyson & Gean Quint 1984 - treated w/ Diamox  (headaches resolved).  ANXIETY, OTHER ISOLATED OR SPECIFIC PHOBIAS -  mult somatic complaints - Rx attempt w/ Cymbalta (no change)... she can't/ won't swallow big pills...  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  Hx of IRITIS (ICD-364.3) - Eval DrTyson 12/05 was neg.   Past Surgical History  Procedure Date  . Vesicovaginal fistula closure w/ tah   . Oophorectomy   . Femoral hernia repair     Outpatient Encounter Prescriptions as of 11/30/2010  Medication Sig Dispense Refill  . allopurinol (ZYLOPRIM) 300 MG tablet Take 300 mg by mouth daily.        Marland Kitchen ALPRAZolam (XANAX) 0.5 MG tablet Take 0.5 mg by mouth 3 (three) times daily as needed.        Marland Kitchen  amLODipine (NORVASC) 10 MG tablet Take 1/2 daily       . aspirin 81 MG EC tablet Take 81 mg by mouth daily.        . cyanocobalamin (,VITAMIN B-12,) 1000 MCG/ML injection USE AS DIRECTED  10 mL  1  . ergocalciferol (VITAMIN D2) 50000 UNITS capsule Take 50,000 Units by mouth once a week.        Marland Kitchen exenatide (BYETTA 10 MCG PEN) 10 MCG/0.04ML SOLN Inject 10 mcg into the skin 2 (two) times daily with a meal.        . furosemide (LASIX) 20 MG tablet TAKE ONE TABLET BY MOUTH EVERY DAY IN THE MORNING  30 tablet  6  . levothyroxine (SYNTHROID, LEVOTHROID) 88 MCG tablet Take 88 mcg by mouth daily.        Marland Kitchen lisinopril (PRINIVIL,ZESTRIL) 20 MG tablet Take 1/2 daily       . Metformin HCl (RIOMET) 500 MG/5ML SOLN Take 5 mLs by mouth 2 (two) times daily.        . methocarbamol (ROBAXIN) 500 MG tablet Take 500 mg by mouth 3 (three) times daily.        . rosuvastatin (CRESTOR) 20 MG tablet Take 1/2 daily       . traMADol (ULTRAM) 50 MG tablet Take 50 mg by mouth every 8 (eight) hours as needed.          Allergies  Allergen Reactions  . Metoprolol Succinate     REACTION: ? rash from Toprol ?  Marland Kitchen Penicillins     REACTION: allergic to penicillin    Current  Medications, Allergies, Past Medical History, Past Surgical History, Family History, and Social History were reviewed in [employer] record.   Review of Systems         See HPI - all other systems neg except as noted...  The patient complains of decreased hearing and dyspnea on exertion.  The patient denies anorexia, fever, weight loss, weight gain, vision loss, hoarseness, chest pain, syncope, peripheral edema, prolonged cough, headaches, hemoptysis, abdominal pain, melena, hematochezia, severe indigestion/heartburn, hematuria, incontinence, muscle weakness, suspicious skin lesions, transient blindness, difficulty walking, depression, unusual weight change, abnormal bleeding, enlarged lymph nodes, and angioedema.     Objective:   Physical Exam     WD, WN, 65 y/o BF in NAD... GENERAL:  Alert & oriented; pleasant & cooperative... HEENT:  [place]/AT, EOM-wnl, PERRLA, EACs-clear, TMs-wnl, NOSE-clear, THROAT-clear & wnl. NECK:  Supple w/ fairROM; no JVD; normal carotid impulses w/o bruits; no thyromegaly or nodules palpated; no lymphadenopathy. CHEST:  Clear to P & A; without wheezes/ rales/ or rhonchi. HEART:  Regular Rhythm; gr 1/6 SEM without rubs or gallops heard... ABDOMEN:  Obese, soft & nontender; + abd panniculus, normal bowel sounds; no organomegaly or masses detected. EXT: without deformities, mild arthritic changes; no varicose veins/ +venous insuffic/ no edema. NEURO:  CN's intact; no focal neuro deficits... DERM:  No lesions noted; no rash etc...   Assessment & Plan:   HBP>  Controlled on Norvasc, Lisinopril, Lasix; continue same + diet, exercise, wt reduction...  Ven Insuffi, Hx DVT>  On ASA, advised no salt, elevation, support hose...  CHOL>  Well controlled on Crestor 10mg /d, continue same...  DM>  Followed by DrAltheimer & well controlled on Metformin & Byetta...  HYPOTHYROID>  Well controlled on Synthroid 54mcg/d...  GI> GERD, IBS, Polyps>  Stable &  up to date on screening...  GU> followed by DrMacDiarmid for recurrent UTIs, sm angiomyolipoma  left kidney> stable...  DJD, CSpine spondy, LBP>  Followed by Joie Bimler et al w/ epidural steroid shots in May12 & Jun12- improved she says...  Hyperuricemia> on Allopurinol daily w/ good control of Uric level on testing per DrAltheimer...  Vit D Deficiency>  On 50K weekly per DrAltheimer who monitors her levels...  B12 Deficiency>  On B12 shots monthly by city nurse & B12 level followed by DrAltheimer...  Anxiety>  Stable on Alprazolam, and she requests refill Rx.Marland KitchenMarland Kitchen

## 2010-11-30 NOTE — Patient Instructions (Signed)
Today we updated your med list in EPIC...    We refilled your Xanax per request...    We also reviewed each med and the reason you are taking that med...  We reviewed the most recent check up from DrAltheimer and his lab work results...  Your dizziness/ balance issues appear to be multifactorial> with inner ear, neuropathy, your back problems all playing a roll...    To be sure there is no central basis for this problem we will refer you to the Neurologists for a stroke evaluation...    In the meanwhile> practice the "EPLEY MANEUVER" (See handout, and check YouTube videos on the technique)...  Call for any questions...  Let's plan a follow up visit in 6 months or sooner if needed for problems.Marland KitchenMarland Kitchen

## 2010-12-08 ENCOUNTER — Encounter: Payer: Self-pay | Admitting: Pulmonary Disease

## 2011-03-09 ENCOUNTER — Encounter: Payer: Self-pay | Admitting: Pulmonary Disease

## 2011-05-01 ENCOUNTER — Other Ambulatory Visit: Payer: Self-pay | Admitting: Pulmonary Disease

## 2011-05-31 ENCOUNTER — Encounter: Payer: Self-pay | Admitting: Pulmonary Disease

## 2011-05-31 ENCOUNTER — Ambulatory Visit (INDEPENDENT_AMBULATORY_CARE_PROVIDER_SITE_OTHER): Payer: Medicare Other | Admitting: Pulmonary Disease

## 2011-05-31 ENCOUNTER — Other Ambulatory Visit (INDEPENDENT_AMBULATORY_CARE_PROVIDER_SITE_OTHER): Payer: Medicare Other

## 2011-05-31 DIAGNOSIS — M545 Low back pain, unspecified: Secondary | ICD-10-CM

## 2011-05-31 DIAGNOSIS — E7800 Pure hypercholesterolemia, unspecified: Secondary | ICD-10-CM

## 2011-05-31 DIAGNOSIS — E119 Type 2 diabetes mellitus without complications: Secondary | ICD-10-CM

## 2011-05-31 DIAGNOSIS — E039 Hypothyroidism, unspecified: Secondary | ICD-10-CM

## 2011-05-31 DIAGNOSIS — R209 Unspecified disturbances of skin sensation: Secondary | ICD-10-CM

## 2011-05-31 DIAGNOSIS — K5900 Constipation, unspecified: Secondary | ICD-10-CM

## 2011-05-31 DIAGNOSIS — K2100 Gastro-esophageal reflux disease with esophagitis, without bleeding: Secondary | ICD-10-CM

## 2011-05-31 DIAGNOSIS — M5030 Other cervical disc degeneration, unspecified cervical region: Secondary | ICD-10-CM

## 2011-05-31 DIAGNOSIS — F411 Generalized anxiety disorder: Secondary | ICD-10-CM

## 2011-05-31 DIAGNOSIS — E559 Vitamin D deficiency, unspecified: Secondary | ICD-10-CM

## 2011-05-31 DIAGNOSIS — Z23 Encounter for immunization: Secondary | ICD-10-CM

## 2011-05-31 DIAGNOSIS — R1030 Lower abdominal pain, unspecified: Secondary | ICD-10-CM

## 2011-05-31 DIAGNOSIS — I10 Essential (primary) hypertension: Secondary | ICD-10-CM

## 2011-05-31 DIAGNOSIS — K589 Irritable bowel syndrome without diarrhea: Secondary | ICD-10-CM

## 2011-05-31 LAB — BASIC METABOLIC PANEL
BUN: 13 mg/dL (ref 6–23)
CO2: 31 mEq/L (ref 19–32)
Calcium: 9.9 mg/dL (ref 8.4–10.5)
Chloride: 106 mEq/L (ref 96–112)
Creatinine, Ser: 1.1 mg/dL (ref 0.4–1.2)
GFR: 63.17 mL/min (ref >60.00)
Glucose, Bld: 118 mg/dL — ABNORMAL HIGH (ref 70–99)
Potassium: 5.5 mEq/L — ABNORMAL HIGH (ref 3.5–5.1)
Sodium: 143 mEq/L (ref 135–145)

## 2011-05-31 MED ORDER — LISINOPRIL 20 MG PO TABS: ORAL_TABLET | ORAL | Status: DC

## 2011-05-31 MED ORDER — ALPRAZOLAM 0.5 MG PO TABS: 0.5000 mg | ORAL_TABLET | Freq: Three times a day (TID) | ORAL | Status: DC | PRN

## 2011-05-31 MED ORDER — AMLODIPINE BESYLATE 10 MG PO TABS: ORAL_TABLET | ORAL | Status: DC

## 2011-05-31 MED ORDER — FUROSEMIDE 20 MG PO TABS: 20.0000 mg | ORAL_TABLET | Freq: Every day | ORAL | Status: DC

## 2011-05-31 NOTE — Patient Instructions (Signed)
Today we updated your med list in our EPIC system...    Continue your current medications the same...  We will sched a CT scan of your Abdomen & Pelvis and we will call you w/ this report when avail...  We will sched a follow up appt w/ DrYan to recheck the paresthesias you are having (& we will try to get a copy of the prev EMG/ NCV testing that she did)...  You should stay in touch w/ your specialty physicians & get regular follow up for your symptoms...  Call for any questions or if we can be of service in any way.Marland KitchenMarland Kitchen

## 2011-05-31 NOTE — Progress Notes (Signed)
Subjective:    Patient ID: Katie [name], female    DOB: July 13, 1945, 66 y.o.   MRN: [id number]  HPI 66 y/o BF here for a follow up visit... he has multiple medical problems including HBP;  Hyperchol, DM, & Hypothyroid followed by DrAltheimer;  Neck & Back pain followed by DrCabbell & DrKramer;  Osteopenia & Vit D defic;  Anemia & Vit B12 defic...   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 she reports a good 6 mo- just c/o some foot cramps intermittently (she would like muscle relaxer & Robaxin written)... her weight remains at 200# & we discussed diet + exercise & the need to lose weight... she will get her 2010 flu shot at work...  ~  Oct 12, 2009:  she continues to follow up w/ DrAltheimer every 68mo w/ complete lab data... she has VI & recent ven dopplers were neg for DVT... she saw DrCabbell 3/11 w/ MRI of lumbar spine- signif degen changes at L4-5 L5-S1 & facet arthropathy> given 2 shots & improved... she notes decr hearing in left ear> assoc w/ cerumen impaction... also wants Alpraz refilled...  ~  April 11, 2010:  she is c/o incr back pain in the TSpine area for the past month (XRay today shows sl scoliosis & mild spondylosis, NAD)... we discussed Rx w/ Tramadol & Robaxin, rest & heat... she saw DrCabbell 8/11 for lumbar back pain & his note is reviewed> he did a series of lumbar injections & ordered EMG/ NCV to check for DM neuropathy (?results)...  she continues to see DrAltheimer every 20mo w/ complete labs done at every visit> his note of 02/07/10 is reviewed w/ the pt...  ~  November 30, 2010:  63mo ROV & she is stable overall, requesting refill Xanax & renewal of handicap sticker;  BP controlled on meds;  She continues to f/u w/ DrAltheimer every 3-4 months w/ extensive labs each visit & his notes are reviewed> DM, Chol, Thyroid, etc (See  prob list below)...    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 In light of her concern for the dizziness & her mult risk factors- we will refer to Neuro to eval her stroke risk, continue ASA 81mg /d, & we reviewed EPLEY maneuver for BPPV in the meanwhile...  ~  May 31, 2011:  16mo ROV & Shahidah tells me she was seen by Dalphine Handing, Neurology & had nerve conduction tests 10/12 but doesn't know the results> we reviewed consult note 8/12 (reviewed w/ the pt)> seen for dizziness (BPPV- treated w/ Epley maneuvers), hx chr LBP, right leg numbness, MRI Lumbar sp 5/12 & EMG 10/11 by [facility]> she felt c/w L5/ S1 radiculopathy & wanted to repeat the EMG/ NCV;  She also saw DrCabbell 10/12 w/ the same questions & his note is reviewed...    >HBP controlled w/ her 3 med regimen: Norvasc5, Lisinopril10, Lasix20; BP= 120/88 & she denies CP, palpit, SOB, edema...    >She  is followed regularly by DrAltheimer & he does her labs; DM controlled on Byetta 73mcgBid & A1c 11/12 was 5.7...    >DrA also treats her Chol w/ Crestor 10mg /d w/ good control of her lipids...    >She take Synthroid88 & her TSH 11/12 was 0.74 at DrA's office...    >Finally she mentioned some vague abd discomfort "something doesn't feel right in there"; she prev saw DrFore, now DrMezer; she had surg 2003 by DrFore & DNewman for TAH, vesicovag fistula closure, oopherectomy & femoral hernia repair; her GI is DrStark & I offered her GI f/u, appt w/ DrNewman, or CT Abd & Pelvis first & that is what she wanted...   Problem List:  ESSENTIAL HYPERTENSION - controlled on ASA 81mg /d, NORVASC 10mg - 1/2 tab, LISINOPRIL 20mg - 1/2 tab, & LASIX  20mg /d... not checking BP's at home but [facility], DrAltheimers office, and BP=120/88 today... she denies HA, fatigue, visual changes, CP, palipit, dizziness, syncope, dyspnea, edema, etc...  Hx of DEEP VENOUS THROMBOPHLEBITIS, VENOUS INSUFFICIENCY - she takes ASA 81mg /d, avoids sodium, elevates legs and wears support hose as needed... ~  Lawson Fiscal Dopplers 5/11 = neg for DVT...  HYPERCHOLESTEROLEMIA (ICD-272.0) - on CRESTOR 20mg - taking 1/2 tab per DrAltheimer & well controlled> he does her full labs every 43months... his latest note & labs from his office reviewed w/ the pt... ~  [place] 12/11 from DrAltheimer on [place] showed TChol 137, TG 105, HDL 87, LDL 51  DIABETES MELLITUS, TYPE II (ICD-250.00) - on METFORMIN 500mg Bid (takes liq Riomet) & BYETTA 56mcgBid per DrAltheimer w/ good control & A1c's around 6.0 (and wt loss of 35# from the Byetta)... diet counselling and DM education thru his staff and full labs checked every 3 months as noted... She sees DrShapiro for eyes (no retinopathy), & DrAljouny for feet (monofilament normal), no known renal dis... ~  Labs 12/11 from DrAltheimer on Metform+Byetta showed BS=131, A1c=6.3  HYPOTHYROIDISM (ICD-244.9) - on SYNTHROID 13mcg/d & labs monitored every 31months by DrAltheimer> stable on this dose w/ TSH~1.0 range... ~  Labs 12/11 from DrAltheimer on Synth88 showed TSH= 0.22  REFLUX ESOPHAGITIS, IRRITABLE BOWEL SYNDROME, COLONIC POLYPS, ADENOMATOUS -  ~  colonoscopy 5/04 by DrStark - adenomatous polyp removed, +hems... ~  f/u colonoscopy 1/10 by DrStark w/ hems & several polyps removed- adenomatous, f/u planned 2yrs.  Hx of IRON DEFICIENCY ANEMIA SECONDARY TO BLOOD LOSS &  VITAMIN B12 DEFICIENCY - on B12 shots 1051mcg Sq monthly given by city nurse at work... he does B12 levels on her as well every 3 months... ~  Labs 12/11 from DrAltheimer's office showed Vit B12 level = 51  UTI'S, RECURRENT (ICD-599.0) - she is followed by DrMezer for Gyn &  she reports neg PAPs... he sent her to DrMacDiarmid for Urology 9/10 for pyuria/ proteinuria/ sm angiomyolipoma upper pole left kidney (otherw neg upper & lower track eval by Urology).  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 Eval & Rx by [facility] 3/02 w/ multilevel DDD - C4-5 C5-6 C6-7;  conservative Rx & improved... Eval & Rx by DrKramer & Joie Bimler 9/07 - L2 compression & pelvic fx after fall... Treated w/ series Lumbar epidural steroid shots & improved... ~  we have given her TRAMADOL 50mg  Tid as needs & ROBAXIN 500mg  Tid Prn... ~  Labs 12/11 from DrAltheimer's office on [place] showed Uric=4.1  VITAMIN D DEFICIENCY (ICD-268.9) - on Vit D 50000u weekly... ~  BMD  2/09 normal w/ TScores +2.4 in spine, and -0.7 in fem neck...  2/09 VitD level only 14 (30-90) & started on VitD 50K/wk...  ~  labs 5/11 by DrAltheimer showed Vit D level 53... rec- continue 50K/wk & he continues to check her levels every 3-6 months.  Hx of CARPAL TUNNEL SYNDROME, BILATERAL - Eval & Rx [facility] & DrSypher in 2001 w/ wrist splints & improved.  Hx of PSEUDOTUMOR CEREBRI & PARESTHESIA - Eval & Rx by DrBrewington/Tyson & Gean Quint 1984 - treated w/ Diamox (headaches resolved).  ANXIETY, OTHER ISOLATED OR SPECIFIC PHOBIAS -  mult somatic complaints - Rx attempt w/ Cymbalta (no change)... she can't/ won't swallow big pills...  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  Hx of IRITIS (ICD-364.3) - Eval DrTyson 12/05 was neg.   Past Surgical History  Procedure Date  . Vesicovaginal fistula closure w/ tah   . Oophorectomy   . Femoral hernia repair     Outpatient Encounter Prescriptions as of 05/31/2011  Medication Sig Dispense Refill  . allopurinol (ZYLOPRIM) 300 MG tablet TAKE ONE TABLET BY MOUTH DAILY  30 tablet  5  . ALPRAZolam (XANAX) 0.5 MG  tablet Take 1 tablet (0.5 mg total) by mouth 3 (three) times daily as needed.  90 tablet  5  . amLODipine (NORVASC) 10 MG tablet Take 1/2 daily       . aspirin 81 MG EC tablet Take 81 mg by mouth daily.        . cyanocobalamin (,VITAMIN B-12,) 1000 MCG/ML injection USE AS DIRECTED  10 mL  1  . ergocalciferol (VITAMIN D2) 50000 UNITS capsule Take 50,000 Units by mouth once a week.        Marland Kitchen exenatide (BYETTA 10 MCG PEN) 10 MCG/0.04ML SOLN Inject 10 mcg into the skin 2 (two) times daily with a meal.        . furosemide (LASIX) 20 MG tablet TAKE ONE TABLET BY MOUTH IN THE MORNING  30 tablet  5  . levothyroxine (SYNTHROID, LEVOTHROID) 88 MCG tablet Take 88 mcg by mouth daily.        Marland Kitchen lisinopril (PRINIVIL,ZESTRIL) 20 MG tablet Take 1/2 daily       . methocarbamol (ROBAXIN) 500 MG tablet Take 500 mg by mouth 3 (three) times daily.        . rosuvastatin (CRESTOR) 20 MG tablet Take 1/2 daily       . traMADol (ULTRAM) 50 MG tablet Take 50 mg by mouth every 8 (eight) hours as needed.        Marland Kitchen DISCONTD: Metformin HCl (RIOMET) 500 MG/5ML SOLN Take 5 mLs by mouth 2 (two) times daily.          Allergies  Allergen Reactions  . Metoprolol Succinate     REACTION: ? rash from Toprol ?  Marland Kitchen Penicillins     REACTION: allergic to penicillin    Current Medications, Allergies, Past Medical History, Past Surgical History, Family History, and Social History were reviewed in [employer] record.   Review of Systems         See HPI - all other systems neg except as noted...  The patient complains of decreased hearing and dyspnea on exertion.  The patient denies anorexia, fever, weight loss, weight gain, vision loss, hoarseness, chest pain, syncope, peripheral edema, prolonged cough, headaches, hemoptysis, abdominal pain, melena, hematochezia, severe indigestion/heartburn, hematuria, incontinence, muscle  weakness, suspicious skin lesions, transient blindness, difficulty walking, depression,  unusual weight change, abnormal bleeding, enlarged lymph nodes, and angioedema.     Objective:   Physical Exam     WD, WN, 66 y/o BF in NAD... GENERAL:  Alert & oriented; pleasant & cooperative... HEENT:  [place]/AT, EOM-wnl, PERRLA, EACs-clear, TMs-wnl, NOSE-clear, THROAT-clear & wnl. NECK:  Supple w/ fairROM; no JVD; normal carotid impulses w/o bruits; no thyromegaly or nodules palpated; no lymphadenopathy. CHEST:  Clear to P & A; without wheezes/ rales/ or rhonchi. HEART:  Regular Rhythm; gr 1/6 SEM without rubs or gallops heard... ABDOMEN:  Obese, soft & nontender; + abd panniculus, normal bowel sounds; no organomegaly or masses detected. EXT: without deformities, mild arthritic changes; no varicose veins/ +venous insuffic/ no edema. NEURO:  CN's intact; no focal neuro deficits... DERM:  No lesions noted; no rash etc...  RADIOLOGY DATA:  Reviewed in the EPIC EMR & discussed w/ the patient...    >>CT Abd & Pelvis is pending...  LABORATORY DATA:  Reviewed in the EPIC EMR & discussed w/ the patient...   Assessment & Plan:   Vague abdominal discomfort> she declined f/u w/ DrStark or appt w/ DrDNewman; she requests CT Abd & Pelvis "to see what is goin on in there"...  LBP, Paresthesia, leg symptoms>  Followed by Dalphine Handing for Neuro & DrCabbell for NS...   HBP>  Controlled on Norvasc, Lisinopril, Lasix; continue same + diet, exercise, wt reduction...  Ven Insuffi, Hx DVT>  On ASA, advised no salt, elevation, support hose...  CHOL>  Well controlled on Crestor 10mg /d, continue same...  DM>  Followed by DrAltheimer & well controlled on Metformin & Byetta...  HYPOTHYROID>  Well controlled on Synthroid 68mcg/d...  GI> GERD, IBS, Polyps>  Stable & up to date on screening...  GU> followed by DrMacDiarmid for recurrent UTIs, sm angiomyolipoma left kidney> stable...  DJD, CSpine spondy, LBP>  Followed by Joie Bimler et al w/ epidural steroid shots in May12 & Jun12- improved she  says...  Hyperuricemia> on Allopurinol daily w/ good control of Uric level on testing per DrAltheimer...  Vit D Deficiency>  On 50K weekly per DrAltheimer who monitors her levels...  B12 Deficiency>  On B12 shots monthly by city nurse & B12 level followed by DrAltheimer...  Anxiety>  Stable on Alprazolam, and she requests refill Rx...   Patient's Medications  New Prescriptions   No medications on file  Previous Medications   ALLOPURINOL (ZYLOPRIM) 300 MG TABLET    TAKE ONE TABLET BY MOUTH DAILY   ASPIRIN 81 MG EC TABLET    Take 81 mg by mouth daily.     CYANOCOBALAMIN (,VITAMIN B-12,) 1000 MCG/ML INJECTION    USE AS DIRECTED   ERGOCALCIFEROL (VITAMIN D2) 50000 UNITS CAPSULE    Take 50,000 Units by mouth once a week.     EXENATIDE (BYETTA 10 MCG PEN) 10 MCG/0.04ML SOLN    Inject 10 mcg into the skin 2 (two) times daily with a meal.     LEVOTHYROXINE (SYNTHROID, LEVOTHROID) 88 MCG TABLET    Take 88 mcg by mouth daily.     METHOCARBAMOL (ROBAXIN) 500 MG TABLET    Take 500 mg by mouth 3 (three) times daily.     ROSUVASTATIN (CRESTOR) 20 MG TABLET    Take 1/2 daily    TRAMADOL (ULTRAM) 50 MG TABLET    Take 50 mg by mouth every 8 (eight) hours as needed.    Modified Medications   Modified Medication Previous Medication  ALPRAZOLAM (XANAX) 0.5 MG TABLET ALPRAZolam (XANAX) 0.5 MG tablet      Take 1 tablet (0.5 mg total) by mouth 3 (three) times daily as needed.    Take 1 tablet (0.5 mg total) by mouth 3 (three) times daily as needed.   AMLODIPINE (NORVASC) 10 MG TABLET amLODipine (NORVASC) 10 MG tablet      Take 1/2 daily    Take 1/2 daily    FUROSEMIDE (LASIX) 20 MG TABLET furosemide (LASIX) 20 MG tablet      Take 1 tablet (20 mg total) by mouth daily.    TAKE ONE TABLET BY MOUTH IN THE MORNING   LISINOPRIL (PRINIVIL,ZESTRIL) 20 MG TABLET lisinopril (PRINIVIL,ZESTRIL) 20 MG tablet      Take 1/2 daily    Take 1/2 daily   Discontinued Medications   METFORMIN HCL (RIOMET) 500 MG/5ML SOLN     Take 5 mLs by mouth 2 (two) times daily.

## 2011-06-04 ENCOUNTER — Encounter: Payer: Self-pay | Admitting: Pulmonary Disease

## 2011-06-06 ENCOUNTER — Telehealth: Payer: Self-pay | Admitting: Pulmonary Disease

## 2011-06-06 ENCOUNTER — Ambulatory Visit (INDEPENDENT_AMBULATORY_CARE_PROVIDER_SITE_OTHER)
Admission: RE | Admit: 2011-06-06 | Discharge: 2011-06-06 | Disposition: A | Discharge status: Home / Self Care | Payer: Medicare Other | Source: Ambulatory Visit | Attending: Pulmonary Disease | Admitting: Pulmonary Disease

## 2011-06-06 DIAGNOSIS — R1030 Lower abdominal pain, unspecified: Secondary | ICD-10-CM

## 2011-06-06 DIAGNOSIS — R109 Unspecified abdominal pain: Secondary | ICD-10-CM

## 2011-06-06 MED ORDER — IOHEXOL 300 MG/ML  SOLN
100.0000 mL | Freq: Once | INTRAMUSCULAR | Status: AC | PRN
  Administered 2011-06-06: 100 mL via INTRAVENOUS

## 2011-06-06 NOTE — Telephone Encounter (Signed)
Rose called back and i spoke with her and she is aware that SN wants to also add ct pelvis.

## 2011-06-23 ENCOUNTER — Telehealth: Payer: Self-pay | Admitting: Pulmonary Disease

## 2011-06-23 NOTE — Telephone Encounter (Signed)
Called and spoke with Ellesse and she is aware that SN has not been taking new primary care pts.  If he is having a lot of problems, then he may need to be seen at urgent care for these problems.  She is aware that we will check with SN on Monday to see if he will see her nephew.  Pt voiced her understanding of this.

## 2011-06-27 NOTE — Telephone Encounter (Signed)
Per SN---we have no openings for primary care---we can refer them to primary care on the 1st floor---either Dr. Ronnald Ramp or Dr. Asa Lente.    thanks

## 2011-06-27 NOTE — Telephone Encounter (Signed)
lmomtcb x1 

## 2011-06-27 NOTE — Telephone Encounter (Signed)
Informed patient that SN was not accepting primary care patients and referred her to call [facility].  Transferred patient to Dr. Adah Salvage office to establish w/ him.

## 2011-08-15 ENCOUNTER — Telehealth: Payer: Self-pay | Admitting: Pulmonary Disease

## 2011-08-15 NOTE — Telephone Encounter (Signed)
I spoke with leigh and she stated to tell them as soon as SN fills it out we will fax it to them. i spoke with lisa and advised them of this. They voiced their understanding and needed nothing further

## 2011-09-29 ENCOUNTER — Telehealth: Payer: Self-pay | Admitting: Pulmonary Disease

## 2011-09-29 NOTE — Telephone Encounter (Signed)
Unclear , no way to know without exam  It does sound muscular.  Typically Gallbladder pain in the upper abdomen/epigastric region with gallstone.  Can take tylenol and use heat As needed  For pain  If not improving will need ov  Please contact office for sooner follow up if symptoms do not improve or worsen or seek emergency care

## 2011-09-29 NOTE — Telephone Encounter (Signed)
Pt aware of recs from TP. Will go to UC or ER if pain worsens otherwise will call office if no improvement after trying recs from TP.

## 2011-09-29 NOTE — Telephone Encounter (Signed)
I spoke with the pt and she states she is having some pain on the right side of her lower abdomen. She states she has been exercising recently and this pain started after that. She states the pain is only when she moves, at rest there is no pain to the area. The pain does not radiate, denies any n/v. Pt states she had a CT of her abdomen in Jan and was told she had some gallstones at that time. Pt wanted to know could this pain be related to that or is it more of an injury from exercising? Please advise. Houston Bing, CMA

## 2011-10-09 ENCOUNTER — Other Ambulatory Visit: Payer: Self-pay | Admitting: Pulmonary Disease

## 2011-11-01 ENCOUNTER — Telehealth: Payer: Self-pay | Admitting: Pulmonary Disease

## 2011-11-01 NOTE — Telephone Encounter (Signed)
Received these forms and we will fax these back once SN has reviewed and signed them.  thanks

## 2011-11-01 NOTE — Telephone Encounter (Signed)
I have spoke with Nicole-sent Medication Review for SN to approve or deny and sign, then fax back. Marliss Czar is unaware of any papers coming to her or SN regarding this matter. Elmyra Ricks is re-faxing to Triage fax machine; Marliss Czar aware I will bring paper to her once we get it.

## 2011-11-02 NOTE — Telephone Encounter (Signed)
LMOM for Katie [name] to be aware that the fax was received and will be faxed back once completed.

## 2011-11-29 ENCOUNTER — Ambulatory Visit (INDEPENDENT_AMBULATORY_CARE_PROVIDER_SITE_OTHER): Payer: Medicare Other | Admitting: Pulmonary Disease

## 2011-11-29 ENCOUNTER — Encounter: Payer: Self-pay | Admitting: Pulmonary Disease

## 2011-11-29 VITALS — BP 102/72 | HR 74 | Temp 96.8°F | Ht 66.0 in | Wt 176.2 lb

## 2011-11-29 DIAGNOSIS — M5030 Other cervical disc degeneration, unspecified cervical region: Secondary | ICD-10-CM

## 2011-11-29 DIAGNOSIS — E119 Type 2 diabetes mellitus without complications: Secondary | ICD-10-CM

## 2011-11-29 DIAGNOSIS — R209 Unspecified disturbances of skin sensation: Secondary | ICD-10-CM

## 2011-11-29 DIAGNOSIS — F411 Generalized anxiety disorder: Secondary | ICD-10-CM

## 2011-11-29 DIAGNOSIS — K589 Irritable bowel syndrome without diarrhea: Secondary | ICD-10-CM

## 2011-11-29 DIAGNOSIS — K2100 Gastro-esophageal reflux disease with esophagitis, without bleeding: Secondary | ICD-10-CM

## 2011-11-29 DIAGNOSIS — M545 Low back pain, unspecified: Secondary | ICD-10-CM

## 2011-11-29 DIAGNOSIS — K5900 Constipation, unspecified: Secondary | ICD-10-CM

## 2011-11-29 DIAGNOSIS — E538 Deficiency of other specified B group vitamins: Secondary | ICD-10-CM

## 2011-11-29 DIAGNOSIS — E039 Hypothyroidism, unspecified: Secondary | ICD-10-CM

## 2011-11-29 DIAGNOSIS — D500 Iron deficiency anemia secondary to blood loss (chronic): Secondary | ICD-10-CM

## 2011-11-29 DIAGNOSIS — I872 Venous insufficiency (chronic) (peripheral): Secondary | ICD-10-CM

## 2011-11-29 DIAGNOSIS — I10 Essential (primary) hypertension: Secondary | ICD-10-CM

## 2011-11-29 DIAGNOSIS — E559 Vitamin D deficiency, unspecified: Secondary | ICD-10-CM

## 2011-11-29 DIAGNOSIS — E7800 Pure hypercholesterolemia, unspecified: Secondary | ICD-10-CM

## 2011-11-29 MED ORDER — ALLOPURINOL 300 MG PO TABS: 300.0000 mg | ORAL_TABLET | Freq: Every day | ORAL | Status: DC

## 2011-11-29 MED ORDER — FUROSEMIDE 20 MG PO TABS: 20.0000 mg | ORAL_TABLET | Freq: Every day | ORAL | Status: DC

## 2011-11-29 NOTE — Patient Instructions (Addendum)
Today we updated your med list in our EPIC system...    Continue your current medications the same...  Continue your regular follow up appt & lab work w/ DrAltheimer...  Call for any questions or if we can be of service in any way...  Let's continue our 38month check ups.Marland KitchenMarland Kitchen

## 2011-11-29 NOTE — Progress Notes (Signed)
Subjective:    Patient ID: Katie [name], female    DOB: 06-Apr-1946, 66 y.o.   MRN: [id number]  HPI 66 y/o BF here for a follow up visit... he has multiple medical problems including HBP;  Hyperchol, DM, & Hypothyroid followed by Katie [name];  Neck & Back pain followed by Katie [name] & Katie [name];  Osteopenia & Vit D defic;  Anemia & Vit B12 defic...   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 she reports a good 6 mo- just c/o some foot cramps intermittently (she would like muscle relaxer & Robaxin written)... her weight remains at 200# & we discussed diet + exercise & the need to lose weight... she will get her 2010 flu shot at work...  ~  Oct 12, 2009:  she continues to follow up w/ Katie [name] every 48mo w/ complete lab data... she has VI & recent ven dopplers were neg for DVT... she saw Katie [name] 3/11 w/ MRI of lumbar spine- signif degen changes at L4-5 L5-S1 & facet arthropathy> given 2 shots & improved... she notes decr hearing in left ear> assoc w/ cerumen impaction... also wants Alpraz refilled...  ~  April 11, 2010:  she is c/o incr back pain in the TSpine area for the past month (XRay today shows sl scoliosis & mild spondylosis, NAD)... we discussed Rx w/ Tramadol & Robaxin, rest & heat... she saw Katie [name] 8/11 for lumbar back pain & his note is reviewed> he did a series of lumbar injections & ordered EMG/ NCV to check for DM neuropathy (?results)...  she continues to see Katie [name] every 13mo w/ complete labs done at every visit> his note of 02/07/10 is reviewed w/ the pt...  ~  November 30, 2010:  17mo ROV & she is stable overall, requesting refill Xanax & renewal of handicap sticker;  BP controlled on meds;  She continues to f/u w/ Katie [name] every 3-4 months w/ extensive labs each visit & his notes are reviewed> DM, Chol, Thyroid, etc (See  prob list below)...    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 In light of her concern for the dizziness & her mult risk factors- we will refer to Neuro to eval her stroke risk, continue ASA 81mg /d, & we reviewed EPLEY maneuver for BPPV in the meanwhile...  ~  May 31, 2011:  58mo ROV & Katie [name] tells me she was seen by Katie [name], Neurology & had nerve conduction tests 10/12 but doesn't know the results> we reviewed consult note 8/12 (reviewed w/ the pt)> seen for dizziness (BPPV- treated w/ Epley maneuvers), hx chr LBP, right leg numbness, MRI Lumbar sp 5/12 & EMG 10/11 by Katie [name]> she felt c/w L5/ S1 radiculopathy & wanted to repeat the EMG/ NCV;  She also saw Katie [name] 10/12 w/ the same questions & his note is reviewed...    >HBP controlled w/ her 3 med regimen: Norvasc5, Lisinopril10, Lasix20; BP= 120/88 & she denies CP, palpit, SOB, edema...    >She  is followed regularly by Katie [name] & he does her labs; DM controlled on Byetta 46mcgBid & A1c 11/12 was 5.7...    >DrA also treats her Chol w/ Crestor 10mg /d w/ good control of her lipids...    >She take Synthroid88 & her TSH 11/12 was 0.74 at DrA's office...    >Finally she mentioned some vague abd discomfort "something doesn't feel right in there"; she prev saw Katie [name], now Katie [name]; she had surg 2003 by Katie [name] & Katie [name] for TAH, vesicovag fistula closure, oopherectomy & femoral hernia repair; her GI is Katie [name] & I offered her GI f/u, appt w/ Katie [name], or CT Abd & Pelvis first & that is what she wanted...  ~  November 29, 2011:  3mo ROV & Katie [name] reports feeling pretty well in general; her CC is numbness under the ball of her right foot  w/ eval by Katie [name] 1/13 & her EMG/NCV showed mild chr bilat L4 radic; prev BPPV improved w/ Epley maneuvers...    She is followed regularly by Katie [name] & he does her labs every 3-4 months> last note 3/13 reviewed: DM- on Byetta w/ weight down 15# to 176# and A1c=5.6;  Lipids- on Cres10 & FLP at goals;  HBP- controlled on 37meds;  Hypothy- stable on [id number];  VitD & VitB12- ok on supplements...    We reviewed prob list, meds, xrays and labs> see below>>    Problem List:  ESSENTIAL HYPERTENSION - controlled on ASA 81mg /d, NORVASC 10mg - 1/2 tab, LISINOPRIL 20mg - 1/2 tab, & LASIX 20mg /d... not checking BP's at home but OK at [facility], DrAltheimers office, etc... ~  1/13: BP=120/88 & she denies HA, fatigue, visual changes, CP, palipit, dizziness, syncope, dyspnea, edema, etc... ~  7/13: BP= 102/72 & she remains asymptomatic...  Hx of DEEP VENOUS THROMBOPHLEBITIS, VENOUS INSUFFICIENCY - she takes ASA 81mg /d, avoids sodium, elevates legs and wears support hose as needed... ~  Katie [name] Dopplers 5/11 = neg for DVT...  HYPERCHOLESTEROLEMIA (ICD-272.0) - on CRESTOR 20mg - taking 1/2 tab per Katie [name] & well controlled> he does her full labs every 59months... his latest note & labs from his office reviewed w/ the pt... ~  [place] 12/11 from Katie [name] on Cres10 showed TChol 137, TG 105, HDL 87, LDL 51 ~  FLP 7/12 from DrA on Cres10 showed TChol 135, TG 59, HDL 70, LDL 31 ~  FLP 3/13 from DrA on Cres10 showed TChol 134, TG -- , HDL 57, LDL 41  DIABETES MELLITUS, TYPE II (ICD-250.00) - on BYETTA 21mcgBid per Katie [name] & off the prev Metformin w/ good control & A1c's around 6.0 (and wt loss of 50# from the Byetta)... diet counselling and DM education thru his staff and full labs checked every 3 months as noted... She sees Katie [name] for eyes (no retinopathy), & Katie [name] for feet (monofilament normal), no known renal dis... ~  Labs 12/11 from Katie [name] on Metform+Byetta showed BS=131, A1c=6.3 ~  Labs 7/12  from [place] on Byetta showed BS= 138, A1c= 6.3 ~  Labs 3/13 from DrA on Byetta showed BS ave= 127, A1c= 5.6  HYPOTHYROIDISM (ICD-244.9) - on SYNTHROID 74mcg/d & labs monitored every 57months by Katie [name]> stable on this dose w/ TSH~1.0 range... ~  Labs 12/11 from Katie [name] on Synth88 showed TSH= 0.22 ~  Labs 7/12 from [place] on Synth88 showed TSH= 0.82 ~  Labs 3/13 from [place] on Synth88 showed TSH= 0.47  REFLUX ESOPHAGITIS, IRRITABLE BOWEL SYNDROME, COLONIC POLYPS, ADENOMATOUS -  ~  colonoscopy 5/04 by Katie [name] - adenomatous polyp removed, +hems... ~  f/u colonoscopy 1/10 by Katie [name] w/ hems & several polyps removed- adenomatous, f/u planned 1yrs.  Hx of IRON DEFICIENCY ANEMIA SECONDARY TO BLOOD LOSS &  VITAMIN B12 DEFICIENCY - on B12 shots 1051mcg Sq monthly given by city nurse at work... he does B12 levels on her as well every 3 months... ~  Labs 12/11 from Katie [name]'s office showed Vit B12 level = 51 ~  Labs 7/12 on B12 SQ monthly showed Vit B12 level = 377 ~  Labs 3/13 on B12 shots monthly showed Vit B12 level = 374  UTI'S, RECURRENT (ICD-599.0) - she is followed by Katie [name] for Gyn & she reports neg PAPs... he sent her to Katie [name] for Urology 9/10 for pyuria/ proteinuria/ sm angiomyolipoma upper pole left kidney (otherw neg upper & lower track eval by Urology).  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 Eval & Rx by [facility] 3/02 w/ multilevel DDD - C4-5 C5-6 C6-7;  conservative Rx & improved... Eval & Rx by Katie [name] & Joie Bimler 9/07 - L2 compression & pelvic fx after fall... Treated w/ series Lumbar epidural steroid shots & improved... ~  we have given her TRAMADOL 50mg  Tid as needs & ROBAXIN 500mg  Tid Prn... 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 ~  BMD 2/09 normal w/ TScores +2.4 in spine, and -0.7 in fem neck...  2/09 VitD level only 14 (30-90) & started on VitD 50K/wk...  ~  labs 5/11 by Katie [name] showed Vit D level 53... rec- continue 50K/wk & he continues to check her levels every 3-6 months. ~  Labs 7/12 on VitD 50K weekly by DrA showed VitD level = 49 ~  Labs 3/13 on VitD 50K weekly showed Vit D level = 60  Hx of CARPAL TUNNEL SYNDROME, BILATERAL - Eval & Rx by [facility] & DrSypher in 2001 w/ wrist splints & improved.  Hx of PSEUDOTUMOR CEREBRI & PARESTHESIA - Eval & Rx by DrBrewington/Tyson & Gean Quint 1984 - treated w/ Diamox (headaches resolved).  ANXIETY, OTHER ISOLATED OR SPECIFIC PHOBIAS -  mult somatic complaints - on XANAX 0.5mg  prn... she can't/ won't swallow big pills...  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  Hx of IRITIS (ICD-364.3) - Eval DrTyson 12/05 was neg.   Past Surgical History  Procedure Date  . Vesicovaginal fistula closure w/ tah   . Oophorectomy   . Femoral hernia repair     Outpatient Encounter Prescriptions as of 11/29/2011  Medication Sig Dispense Refill  . allopurinol (ZYLOPRIM) 300 MG tablet TAKE ONE TABLET BY MOUTH DAILY  30 tablet  5  . ALPRAZolam (XANAX) 0.5 MG tablet Take 1 tablet (0.5 mg total) by mouth 3 (three) times daily as needed.  90 tablet  5  . amLODipine (NORVASC) 10 MG tablet Take 1/2 daily  30 tablet  5  . aspirin 81 MG EC tablet Take 81 mg by mouth daily.        . cyanocobalamin (,VITAMIN B-12,) 1000 MCG/ML injection USE AS DIRECTED  10 mL  1  . ergocalciferol (VITAMIN D2) 50000 UNITS capsule Take 50,000 Units by mouth once a week.        Marland Kitchen  exenatide (BYETTA 10 MCG PEN) 10 MCG/0.04ML SOLN Inject 10 mcg into the skin 2 (two) times daily with a meal.        . furosemide (LASIX) 20 MG tablet Take 1 tablet (20 mg total) by mouth daily.   30 tablet  5  . levothyroxine (SYNTHROID, LEVOTHROID) 88 MCG tablet Take 88 mcg by mouth daily.        Marland Kitchen lisinopril (PRINIVIL,ZESTRIL) 20 MG tablet Take 1/2 daily  30 tablet  5  . methocarbamol (ROBAXIN) 500 MG tablet Take 500 mg by mouth 3 (three) times daily.        . rosuvastatin (CRESTOR) 20 MG tablet Take 1/2 daily       . traMADol (ULTRAM) 50 MG tablet Take 50 mg by mouth every 8 (eight) hours as needed.          Allergies  Allergen Reactions  . Metoprolol Succinate     REACTION: ? rash from Toprol ?  Marland Kitchen Penicillins     REACTION: allergic to penicillin    Current Medications, Allergies, Past Medical History, Past Surgical History, Family History, and Social History were reviewed in [employer] record.   Review of Systems         See HPI - all other systems neg except as noted...  The patient complains of decreased hearing and dyspnea on exertion.  The patient denies anorexia, fever, weight loss, weight gain, vision loss, hoarseness, chest pain, syncope, peripheral edema, prolonged cough, headaches, hemoptysis, abdominal pain, melena, hematochezia, severe indigestion/heartburn, hematuria, incontinence, muscle weakness, suspicious skin lesions, transient blindness, difficulty walking, depression, unusual weight change, abnormal bleeding, enlarged lymph nodes, and angioedema.     Objective:   Physical Exam     WD, WN, 66 y/o BF in NAD... GENERAL:  Alert & oriented; pleasant & cooperative... HEENT:  [place]/AT, EOM-wnl, PERRLA, EACs-clear, TMs-wnl, NOSE-clear, THROAT-clear & wnl. NECK:  Supple w/ fairROM; no JVD; normal carotid impulses w/o bruits; no thyromegaly or nodules palpated; no lymphadenopathy. CHEST:  Clear to P & A; without wheezes/ rales/ or rhonchi. HEART:  Regular Rhythm; gr 1/6 SEM without rubs or gallops heard... ABDOMEN:  Obese, soft & nontender; + abd panniculus, normal bowel sounds; no organomegaly or masses detected. EXT: without deformities,  mild arthritic changes; no varicose veins/ +venous insuffic/ no edema. NEURO:  CN's intact; no focal neuro deficits... DERM:  No lesions noted; no rash etc...  RADIOLOGY DATA:  Reviewed in the EPIC EMR & discussed w/ the patient...    >>CT Abd & Pelvis is pending...  LABORATORY DATA:  Reviewed in the EPIC EMR & discussed w/ the patient...   Assessment & Plan:   Vague abdominal discomfort> she declined f/u w/ Katie [name] or appt w/ DrDNewman; she requests CT Abd & Pelvis "to see what is goin on in there"...  LBP, Paresthesia, leg symptoms>  Followed by Katie [name] for Neuro & Katie [name] for NS...   HBP>  Controlled on Norvasc, Lisinopril, Lasix; continue same + diet, exercise, wt reduction...  Ven Insuffi, Hx DVT>  On ASA, advised no salt, elevation, support hose...  CHOL>  Well controlled on Crestor 10mg /d, continue same...  DM>  Followed by Katie [name] & well controlled on Metformin & Byetta...  HYPOTHYROID>  Well controlled on Synthroid 60mcg/d...  GI> GERD, IBS, Polyps>  Stable & up to date on screening...  GU> followed by Katie [name] for recurrent UTIs, sm angiomyolipoma left kidney> stable...  DJD, CSpine spondy, LBP>  Followed by Joie Bimler et al w/ epidural  steroid shots in [place]- improved she says...  Hyperuricemia> on Allopurinol daily w/ good control of Uric level on testing per Katie [name]...  Vit D Deficiency>  On 50K weekly per Katie [name] who monitors her levels...  B12 Deficiency>  On B12 shots monthly by city nurse & B12 level followed by Katie [name]...  Anxiety>  Stable on Alprazolam, and she requests refill Rx...   Patient's Medications  New Prescriptions   No medications on file  Previous Medications   ALPRAZOLAM (XANAX) 0.5 MG TABLET    Take 1 tablet (0.5 mg total) by mouth 3 (three) times daily as needed.   AMLODIPINE (NORVASC) 5 MG TABLET    Take 5 mg by mouth daily.   ASPIRIN 81 MG EC TABLET    Take 81 mg by mouth daily.     CYANOCOBALAMIN  (,VITAMIN B-12,) 1000 MCG/ML INJECTION    USE AS DIRECTED   ERGOCALCIFEROL (VITAMIN D2) 50000 UNITS CAPSULE    Take 50,000 Units by mouth once a week.     EXENATIDE (BYETTA 10 MCG PEN) 10 MCG/0.04ML SOLN    Inject 10 mcg into the skin 2 (two) times daily with a meal.     LEVOTHYROXINE (SYNTHROID, LEVOTHROID) 88 MCG TABLET    Take 88 mcg by mouth daily.     LISINOPRIL (PRINIVIL,ZESTRIL) 10 MG TABLET    Take 10 mg by mouth daily.   METHOCARBAMOL (ROBAXIN) 500 MG TABLET    Take 500 mg by mouth 3 (three) times daily.     ROSUVASTATIN (CRESTOR) 20 MG TABLET    Take 1/2 daily    TRAMADOL (ULTRAM) 50 MG TABLET    Take 50 mg by mouth every 8 (eight) hours as needed.    Modified Medications   Modified Medication Previous Medication   ALLOPURINOL (ZYLOPRIM) 300 MG TABLET allopurinol (ZYLOPRIM) 300 MG tablet      Take 1 tablet (300 mg total) by mouth daily.    TAKE ONE TABLET BY MOUTH DAILY   FUROSEMIDE (LASIX) 20 MG TABLET furosemide (LASIX) 20 MG tablet      Take 1 tablet (20 mg total) by mouth daily.    Take 1 tablet (20 mg total) by mouth daily.  Discontinued Medications   AMLODIPINE (NORVASC) 10 MG TABLET    Take 1/2 daily   LISINOPRIL (PRINIVIL,ZESTRIL) 20 MG TABLET    Take 1/2 daily

## 2011-12-02 ENCOUNTER — Encounter: Payer: Self-pay | Admitting: Pulmonary Disease

## 2011-12-23 ENCOUNTER — Other Ambulatory Visit: Payer: Self-pay | Admitting: Pulmonary Disease

## 2011-12-25 ENCOUNTER — Other Ambulatory Visit: Payer: Self-pay | Admitting: Pulmonary Disease

## 2012-04-05 ENCOUNTER — Other Ambulatory Visit: Payer: Self-pay | Admitting: Neurosurgery

## 2012-04-05 ENCOUNTER — Encounter: Payer: Self-pay | Admitting: Pulmonary Disease

## 2012-04-05 DIAGNOSIS — M545 Low back pain, unspecified: Secondary | ICD-10-CM

## 2012-04-16 ENCOUNTER — Ambulatory Visit
Admission: RE | Admit: 2012-04-16 | Discharge: 2012-04-16 | Disposition: A | Discharge status: Home / Self Care | Payer: Medicare Other | Source: Ambulatory Visit | Attending: Neurosurgery | Admitting: Neurosurgery

## 2012-04-16 DIAGNOSIS — M545 Low back pain, unspecified: Secondary | ICD-10-CM

## 2012-04-16 MED ORDER — GADOBENATE DIMEGLUMINE 529 MG/ML IV SOLN
16.0000 mL | Freq: Once | INTRAVENOUS | Status: AC | PRN
  Administered 2012-04-16: 16 mL via INTRAVENOUS

## 2012-05-30 ENCOUNTER — Ambulatory Visit (INDEPENDENT_AMBULATORY_CARE_PROVIDER_SITE_OTHER): Payer: Medicare Other | Admitting: Pulmonary Disease

## 2012-05-30 ENCOUNTER — Encounter: Payer: Self-pay | Admitting: Pulmonary Disease

## 2012-05-30 VITALS — BP 120/82 | HR 72 | Temp 97.9°F | Ht 66.0 in | Wt 168.0 lb

## 2012-05-30 DIAGNOSIS — K5900 Constipation, unspecified: Secondary | ICD-10-CM

## 2012-05-30 DIAGNOSIS — K589 Irritable bowel syndrome without diarrhea: Secondary | ICD-10-CM

## 2012-05-30 DIAGNOSIS — Z86010 Personal history of colonic polyps: Secondary | ICD-10-CM

## 2012-05-30 DIAGNOSIS — K2100 Gastro-esophageal reflux disease with esophagitis, without bleeding: Secondary | ICD-10-CM

## 2012-05-30 DIAGNOSIS — I872 Venous insufficiency (chronic) (peripheral): Secondary | ICD-10-CM

## 2012-05-30 DIAGNOSIS — N390 Urinary tract infection, site not specified: Secondary | ICD-10-CM

## 2012-05-30 DIAGNOSIS — I10 Essential (primary) hypertension: Secondary | ICD-10-CM

## 2012-05-30 DIAGNOSIS — F411 Generalized anxiety disorder: Secondary | ICD-10-CM

## 2012-05-30 DIAGNOSIS — M5030 Other cervical disc degeneration, unspecified cervical region: Secondary | ICD-10-CM

## 2012-05-30 DIAGNOSIS — E039 Hypothyroidism, unspecified: Secondary | ICD-10-CM

## 2012-05-30 DIAGNOSIS — E119 Type 2 diabetes mellitus without complications: Secondary | ICD-10-CM

## 2012-05-30 DIAGNOSIS — E7800 Pure hypercholesterolemia, unspecified: Secondary | ICD-10-CM

## 2012-05-30 MED ORDER — LISINOPRIL 10 MG PO TABS: 10.0000 mg | ORAL_TABLET | Freq: Every day | ORAL | Status: DC

## 2012-05-30 MED ORDER — TRAMADOL HCL 50 MG PO TABS: 50.0000 mg | ORAL_TABLET | Freq: Three times a day (TID) | ORAL | Status: DC | PRN

## 2012-05-30 MED ORDER — AMLODIPINE BESYLATE 5 MG PO TABS: 5.0000 mg | ORAL_TABLET | Freq: Every day | ORAL | Status: DC

## 2012-05-30 MED ORDER — METHOCARBAMOL 500 MG PO TABS: 500.0000 mg | ORAL_TABLET | Freq: Three times a day (TID) | ORAL | Status: DC

## 2012-05-30 NOTE — Patient Instructions (Addendum)
Today we updated your med list in our EPIC system...    Continue your current medications the same...    We refilled the meds you requested...  We decided to stop the Allopurinol as discussed...  Call for any questions...  Let's plan a 6 month follow up visit.Marland KitchenMarland Kitchen

## 2012-05-30 NOTE — Progress Notes (Signed)
Subjective:    Patient ID: Katie [name], female    DOB: 1945-12-30, 67 y.o.   MRN: [id number]  HPI 67 y/o BF here for a follow up visit... he has multiple medical problems including HBP;  Hyperchol, DM, & Hypothyroid followed by DrAltheimer;  Neck & Back pain followed by DrCabbell & DrKramer;  Osteopenia & Vit D defic;  Anemia & Vit B12 defic...   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 she reports a good 6 mo- just c/o some foot cramps intermittently (she would like muscle relaxer & Robaxin written)... her weight remains at 200# & we discussed diet + exercise & the need to lose weight... she will get her 2010 flu shot at work...  ~  Oct 12, 2009:  she continues to follow up w/ DrAltheimer every 15mo w/ complete lab data... she has VI & recent ven dopplers were neg for DVT... she saw DrCabbell 3/11 w/ MRI of lumbar spine- signif degen changes at L4-5 L5-S1 & facet arthropathy> given 2 shots & improved... she notes decr hearing in left ear> assoc w/ cerumen impaction... also wants Alpraz refilled...  ~  April 11, 2010:  she is c/o incr back pain in the TSpine area for the past month (XRay today shows sl scoliosis & mild spondylosis, NAD)... we discussed Rx w/ Tramadol & Robaxin, rest & heat... she saw DrCabbell 8/11 for lumbar back pain & his note is reviewed> he did a series of lumbar injections & ordered EMG/ NCV to check for DM neuropathy (?results)...  she continues to see DrAltheimer every 19mo w/ complete labs done at every visit> his note of 02/07/10 is reviewed w/ the pt...  ~  November 30, 2010:  57mo ROV & she is stable overall, requesting refill Xanax & renewal of handicap sticker;  BP controlled on meds;  She continues to f/u w/ DrAltheimer every 3-4 months w/ extensive labs each visit & his notes are reviewed> DM, Chol, Thyroid, etc (See  prob list below)...    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 In light of her concern for the dizziness & her mult risk factors- we will refer to Neuro to eval her stroke risk, continue ASA 81mg /d, & we reviewed EPLEY maneuver for BPPV in the meanwhile...  ~  May 31, 2011:  58mo ROV & Katie [name] tells me she was seen by Dalphine Handing, Neurology & had nerve conduction tests 10/12 but doesn't know the results> we reviewed consult note 8/12 (reviewed w/ the pt)> seen for dizziness (BPPV- treated w/ Epley maneuvers), hx chr LBP, right leg numbness, MRI Lumbar sp 5/12 & EMG 10/11 by [facility]> she felt c/w L5/ S1 radiculopathy & wanted to repeat the EMG/ NCV;  She also saw DrCabbell 10/12 w/ the same questions & his note is reviewed...    >HBP controlled w/ her 3 med regimen: Norvasc5, Lisinopril10, Lasix20; BP= 120/88 & she denies CP, palpit, SOB, edema...    >She  is followed regularly by DrAltheimer & he does her labs; DM controlled on Byetta 9mcgBid & A1c 11/12 was 5.7...    >DrA also treats her Chol w/ Crestor 10mg /d w/ good control of her lipids...    >She take Synthroid88 & her TSH 11/12 was 0.74 at DrA's office...    >Finally she mentioned some vague abd discomfort "something doesn't feel right in there"; she prev saw DrFore, now DrMezer; she had surg 2003 by DrFore & DNewman for TAH, vesicovag fistula closure, oopherectomy & femoral hernia repair; her GI is DrStark & I offered her GI f/u, appt w/ DrNewman, or CT Abd & Pelvis first & that is what she wanted...  ~  November 29, 2011:  27mo ROV & Katie [name] reports feeling pretty well in general; her CC is numbness under the ball of her right foot  w/ eval by Dalphine Handing 1/13 & her EMG/NCV showed mild chr bilat L4 radic; prev BPPV improved w/ Epley maneuvers...    She is followed regularly by DrAltheimer & he does her labs every 3-4 months> last note 3/13 reviewed: DM- on Byetta w/ weight down 15# to 176# and A1c=5.6;  Lipids- on Cres10 & FLP at goals;  HBP- controlled on 57meds;  Hypothy- stable on [id number];  VitD & VitB12- ok on supplements...    We reviewed prob list, meds, xrays and labs> see below>>   ~  May 30, 2012:  58mo ROV & Katie [name] is feeling well, exercising at the gym 4-5d/wk and no new complaints or concerns; she remains under considerable stress w/ her husband's health issues and drug abuse... We reviewed the following medical problems during today's office visit >>      HBP> on ASA81, Amlod5, Lisin10, Lasix20; BP= 120/82 & denies CP, palpit, dizzy, SOB, edema, etc...    VenInsuffic, DVT> on ASA81; she knows to avoid sodium, elev legs, wear support hose...    CHOL> on Cres20-1/2; labs done regularly by DrAltheimer who sees her Q110mo; FLP 7/13 showed TChol 112, TG 77, HDL 60, LDL 23...    DM> on Diet alone; labs per DrAlt showed ave BS= 122 and A1c=5.8    Hypothy> on Levothy88; TSH 7/13 by DrAlt showed TSH= 0.18    GERD, IBS, Polyps> stable- denies abd pain, n/v, d/c, blood seen; last colon 1/10 had several adenomas removed & f/u planned 49yrs...    UTI's> followed by DrMacDiarmid & she denies recent urinary symptoms...    DJD, DDD, Cspine dis & LBP> on Robaxin500Tid, Tramadol50Tid;     Hyperuricemia> on Allopurinol300 but she wants to stop; Uric acid 7/13 by DrAlt = 4.3; she will decide about stopping the Allpourinol...    VitD defic> on VitD50K/wk; labs 7/13 by DrAlt showed VitD= 50; continue same...    Anxiety> on [id number]; it is working well 7 she wants to contuinue same...    Anemia> on B12-1000mg /d & B12 level 7/13 = 323  We reviewed prob list, meds, xrays and labs> see below for updates >> she had the 2013 flu vaccine; Rx  written for Shingles vaccine...     Problem List:  ESSENTIAL HYPERTENSION - controlled on ASA 81mg /d, NORVASC 10mg - 1/2 tab, LISINOPRIL 20mg - 1/2 tab, & LASIX 20mg /d... not checking BP's at home but OK at [facility], DrAltheimers office, etc... ~  1/13: BP=120/88 & she denies HA, fatigue, visual changes, CP, palipit, dizziness, syncope, dyspnea, edema, etc... ~  7/13: BP= 102/72 & she remains asymptomatic... ~  1/14: on ASA81, Amlod5, Lisin10, Lasix20; BP=  120/82 & denies CP, palpit, dizzy, SOB, edema, etc..   Hx of DEEP VENOUS THROMBOPHLEBITIS, VENOUS INSUFFICIENCY - she takes ASA 81mg /d, avoids sodium, elevates legs and wears support hose as needed... ~  [place] Fiscal Dopplers 5/11 = neg for DVT...  HYPERCHOLESTEROLEMIA (ICD-272.0) - on CRESTOR 20mg - taking 1/2 tab per DrAltheimer & well controlled> he does her full labs every 34months... his latest note & labs from his office reviewed w/ the pt... ~  [place] 12/11 from DrAltheimer on Cres10 showed TChol 137, TG 105, HDL 87, LDL 51 ~  FLP 7/12 from DrA on Cres10 showed TChol 135, TG 59, HDL 70, LDL 31 ~  FLP 3/13 from DrA on Cres10 showed TChol 134, TG -- , HDL 57, LDL 41 ~  on Cres20-1/2; labs done regularly by DrAltheimer who sees her Q61mo; FLP 7/13 showed TChol 112, TG 77, HDL 60, LDL 23  DIABETES MELLITUS, TYPE II (ICD-250.00) - on BYETTA 45mcgBid per DrAltheimer & off the prev Metformin w/ good control & A1c's around 6.0 (and wt loss of 50# from the Byetta)... diet counselling and DM education thru his staff and full labs checked every 3 months as noted... She sees DrShapiro for eyes (no retinopathy), & DrAljouny for feet (monofilament normal), no known renal dis... ~  Labs 12/11 from DrAltheimer on Metform+Byetta showed BS=131, A1c=6.3 ~  Labs 7/12 from [place] on Byetta showed BS= 138, A1c= 6.3 ~  Labs 3/13 from DrA on Byetta showed BS ave= 127, A1c= 5.6 ~  on Diet alone; labs 7/13 per DrAlt showed ave BS= 122 and A1c=5.8  HYPOTHYROIDISM  (ICD-244.9) - on SYNTHROID 37mcg/d & labs monitored every 13months by DrAltheimer> stable on this dose w/ TSH~1.0 range... ~  Labs 12/11 from DrAltheimer on Synth88 showed TSH= 0.22 ~  Labs 7/12 from [place] on Synth88 showed TSH= 0.82 ~  Labs 3/13 from [place] on Synth88 showed TSH= 0.47 ~  on Levothy88; TSH 7/13 by DrAlt showed TSH= 0.18  REFLUX ESOPHAGITIS, IRRITABLE BOWEL SYNDROME, COLONIC POLYPS, ADENOMATOUS -  ~  colonoscopy 5/04 by DrStark - adenomatous polyp removed, +hems... ~  f/u colonoscopy 1/10 by DrStark w/ hems & several polyps removed- adenomatous, f/u planned 47yrs. ~  CT Abd 1/13 showed gallstones, s/p hyst, otherw neg w/o lesions etc...  Hx of IRON DEFICIENCY ANEMIA SECONDARY TO BLOOD LOSS &  VITAMIN B12 DEFICIENCY - on B12 shots 1062mcg Sq monthly given by city nurse at work... he does B12 levels on her as well every 3 months... ~  Labs 12/11 from DrAltheimer's office showed Vit B12 level = 51 ~  Labs 7/12 on B12 SQ monthly showed Vit B12 level = 377 ~  Labs 3/13 on B12 shots monthly showed Vit B12 level = 374 ~  on B12-1000mg /d & B12 level 7/13 = 323   UTI'S, RECURRENT (ICD-599.0) - she is followed by DrMezer for Gyn & she reports neg PAPs... he sent her to DrMacDiarmid for Urology 9/10 for pyuria/ proteinuria/ sm angiomyolipoma upper pole left kidney (otherw neg upper & lower track eval by Urology).  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 Eval & Rx by [facility] 3/02 w/ multilevel DDD - C4-5 C5-6 C6-7;  conservative Rx & improved... Eval & Rx by DrKramer & Joie Bimler 9/07 - L2 compression & pelvic fx after fall... Treated w/ series Lumbar epidural steroid shots & improved... ~  we have given her TRAMADOL 50mg  Tid as needs & ROBAXIN 500mg  Tid  Prn... ~  Labs 12/11 from DrAltheimer's office on [place] showed Uric=4.1 ~  Labs 7/12 from [place] on Allopurinol 300mg /d showed Uric= 5.3 ~  Labs 3/13 from DrA on  Allopurinol 300mg /d showed Uric = 4.3 ~  12/13: MRI Lumbar sp per DrCabbell showed severe L4-5 facet arthritis, mod right L5-S1 facet arthritis...  VITAMIN D DEFICIENCY (ICD-268.9) - on Vit D 50000u weekly... ~  BMD 2/09 normal w/ TScores +2.4 in spine, and -0.7 in fem neck...  2/09 VitD level only 14 (30-90) & started on VitD 50K/wk...  ~  labs 5/11 by DrAltheimer showed Vit D level 53... rec- continue 50K/wk & he continues to check her levels every 3-6 months. ~  Labs 7/12 on VitD 50K weekly by DrA showed VitD level = 49 ~  Labs 3/13 on VitD 50K weekly showed Vit D level = 60  Hx of CARPAL TUNNEL SYNDROME, BILATERAL - Eval & Rx by [facility] & DrSypher in 2001 w/ wrist splints & improved.  Hx of PSEUDOTUMOR CEREBRI & PARESTHESIA - Eval & Rx by DrBrewington/Tyson & Gean Quint 1984 - treated w/ Diamox (headaches resolved).  ANXIETY, OTHER ISOLATED OR SPECIFIC PHOBIAS -  mult somatic complaints - on XANAX 0.5mg  prn... she can't/ won't swallow big pills...  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  Hx of IRITIS (ICD-364.3) - Eval DrTyson 12/05 was neg.   Past Surgical History  Procedure Date  . Vesicovaginal fistula closure w/ tah   . Oophorectomy   . Femoral hernia repair     Outpatient Encounter Prescriptions as of 05/30/2012  Medication Sig Dispense Refill  . allopurinol (ZYLOPRIM) 300 MG tablet Take 1 tablet (300 mg total) by mouth daily.  90 tablet  3  . ALPRAZolam (XANAX) 0.5 MG tablet TAKE ONE TABLET BY MOUTH THREE TIMES DAILY AS NEEDED  90 tablet  5  . amLODipine (NORVASC) 5 MG tablet Take 5 mg by mouth daily.      Marland Kitchen aspirin 81 MG EC tablet Take 81 mg by mouth daily.        . cyanocobalamin (,VITAMIN B-12,) 1000 MCG/ML injection USE AS DIRECTED  10 mL  1  . ergocalciferol (VITAMIN D2) 50000 UNITS capsule Take 50,000 Units by mouth once a week.        Marland Kitchen exenatide (BYETTA 10 MCG PEN) 10  MCG/0.04ML SOLN Inject 10 mcg into the skin 2 (two) times daily with a meal.        . furosemide (LASIX) 20 MG tablet Take 1 tablet (20 mg total) by mouth daily.  90 tablet  3  . levothyroxine (SYNTHROID, LEVOTHROID) 88 MCG tablet Take 88 mcg by mouth daily.        Marland Kitchen lisinopril (PRINIVIL,ZESTRIL) 10 MG tablet Take 10 mg by mouth daily.      . methocarbamol (ROBAXIN) 500 MG tablet Take 500 mg by mouth 3 (three) times daily.        . rosuvastatin (CRESTOR) 20 MG tablet Take 1/2 daily       . traMADol (ULTRAM) 50 MG tablet Take 50 mg by mouth every 8 (eight) hours as needed.          Allergies  Allergen Reactions  . Metoprolol Succinate     REACTION: ? rash from Toprol ?  Marland Kitchen Penicillins     REACTION: allergic to penicillin    Current Medications, Allergies, Past Medical History, Past Surgical History, Family  History, and Social History were reviewed in [employer] record.   Review of Systems         See HPI - all other systems neg except as noted...  The patient complains of decreased hearing and dyspnea on exertion.  The patient denies anorexia, fever, weight loss, weight gain, vision loss, hoarseness, chest pain, syncope, peripheral edema, prolonged cough, headaches, hemoptysis, abdominal pain, melena, hematochezia, severe indigestion/heartburn, hematuria, incontinence, muscle weakness, suspicious skin lesions, transient blindness, difficulty walking, depression, unusual weight change, abnormal bleeding, enlarged lymph nodes, and angioedema.     Objective:   Physical Exam     WD, WN, 67 y/o BF in NAD... GENERAL:  Alert & oriented; pleasant & cooperative... HEENT:  [place]/AT, EOM-wnl, PERRLA, EACs-clear, TMs-wnl, NOSE-clear, THROAT-clear & wnl. NECK:  Supple w/ fairROM; no JVD; normal carotid impulses w/o bruits; no thyromegaly or nodules palpated; no lymphadenopathy. CHEST:  Clear to P & A; without wheezes/ rales/ or rhonchi. HEART:  Regular Rhythm; gr 1/6 SEM  without rubs or gallops heard... ABDOMEN:  Obese, soft & nontender; + abd panniculus, normal bowel sounds; no organomegaly or masses detected. EXT: without deformities, mild arthritic changes; no varicose veins/ +venous insuffic/ no edema. NEURO:  CN's intact; no focal neuro deficits... DERM:  No lesions noted; no rash etc...  RADIOLOGY DATA:  Reviewed in the EPIC EMR & discussed w/ the patient...    >>CT Abd & Pelvis is pending...  LABORATORY DATA:  Reviewed in the EPIC EMR & discussed w/ the patient...   Assessment & Plan:    Vague abdominal discomfort> she declined f/u w/ DrStark or appt w/ DrDNewman; she requests CT Abd & Pelvis "to see what is goin on in there"...  LBP, Paresthesia, leg symptoms>  Followed by Dalphine Handing for Neuro & DrCabbell for NS...   HBP>  Controlled on Norvasc, Lisinopril, Lasix; continue same + diet, exercise, wt reduction...  Ven Insuffi, Hx DVT>  On ASA, advised no salt, elevation, support hose...  CHOL>  Well controlled on Crestor 10mg /d, continue same...  DM>  Followed by DrAltheimer & well controlled on Metformin & Byetta...  HYPOTHYROID>  Well controlled on Synthroid 52mcg/d...  GI> GERD, IBS, Polyps>  Stable & up to date on screening...  GU> followed by DrMacDiarmid for recurrent UTIs, sm angiomyolipoma left kidney> stable...  DJD, CSpine spondy, LBP>  Followed by Joie Bimler et al w/ epidural steroid shots in May12 & Jun12- improved she says...  Hyperuricemia> on Allopurinol daily w/ good control of Uric level on testing per DrAltheimer...  Vit D Deficiency>  On 50K weekly per DrAltheimer who monitors her levels...  B12 Deficiency>  On B12 shots monthly by city nurse & B12 level followed by DrAltheimer...  Anxiety>  Stable on Alprazolam, and she requests refill Rx...   Patient's Medications  New Prescriptions   No medications on file  Previous Medications   ALPRAZOLAM (XANAX) 0.5 MG TABLET    TAKE ONE TABLET BY MOUTH THREE TIMES DAILY AS  NEEDED   ASPIRIN 81 MG EC TABLET    Take 81 mg by mouth daily.     CYANOCOBALAMIN (,VITAMIN B-12,) 1000 MCG/ML INJECTION    USE AS DIRECTED   ERGOCALCIFEROL (VITAMIN D2) 50000 UNITS CAPSULE    Take 50,000 Units by mouth once a week.     EXENATIDE (BYETTA 10 MCG PEN) 10 MCG/0.04ML SOLN    Inject 10 mcg into the skin 2 (two) times daily with a meal.     FUROSEMIDE (LASIX) 20  MG TABLET    Take 1 tablet (20 mg total) by mouth daily.   LEVOTHYROXINE (SYNTHROID, LEVOTHROID) 88 MCG TABLET    Take 88 mcg by mouth daily.     ROSUVASTATIN (CRESTOR) 20 MG TABLET    Take 1/2 daily   Modified Medications   Modified Medication Previous Medication   AMLODIPINE (NORVASC) 5 MG TABLET amLODipine (NORVASC) 5 MG tablet      Take 1 tablet (5 mg total) by mouth daily.    Take 5 mg by mouth daily.   LISINOPRIL (PRINIVIL,ZESTRIL) 10 MG TABLET lisinopril (PRINIVIL,ZESTRIL) 10 MG tablet      Take 1 tablet (10 mg total) by mouth daily.    Take 10 mg by mouth daily.   METHOCARBAMOL (ROBAXIN) 500 MG TABLET methocarbamol (ROBAXIN) 500 MG tablet      Take 1 tablet (500 mg total) by mouth 3 (three) times daily.    Take 500 mg by mouth 3 (three) times daily.     TRAMADOL (ULTRAM) 50 MG TABLET traMADol (ULTRAM) 50 MG tablet      Take 1 tablet (50 mg total) by mouth every 8 (eight) hours as needed.    Take 50 mg by mouth every 8 (eight) hours as needed.    Discontinued Medications   ALLOPURINOL (ZYLOPRIM) 300 MG TABLET    Take 1 tablet (300 mg total) by mouth daily.

## 2012-05-31 MED ORDER — AMLODIPINE BESYLATE 5 MG PO TABS: 5.0000 mg | ORAL_TABLET | Freq: Every day | ORAL | Status: DC

## 2012-05-31 MED ORDER — METHOCARBAMOL 500 MG PO TABS: 500.0000 mg | ORAL_TABLET | Freq: Three times a day (TID) | ORAL | Status: DC

## 2012-05-31 MED ORDER — TRAMADOL HCL 50 MG PO TABS: 50.0000 mg | ORAL_TABLET | Freq: Three times a day (TID) | ORAL | Status: DC | PRN

## 2012-05-31 MED ORDER — LISINOPRIL 10 MG PO TABS: 10.0000 mg | ORAL_TABLET | Freq: Every day | ORAL | Status: DC

## 2012-06-11 ENCOUNTER — Telehealth: Payer: Self-pay | Admitting: Pulmonary Disease

## 2012-06-11 NOTE — Telephone Encounter (Signed)
Per SN---ok to try the vit e for the cramps.  400-800 daily.  thanks

## 2012-06-11 NOTE — Telephone Encounter (Signed)
I spoke with pt and she stated she has been having leg,feet and occasional hand cramps. This happens daily about 1-2 times and last about 5 minutes each time. This has been going on for about 2 weeks now. She has to rub the cramps out. She is not sure what is causing this. She was told if she takes vit E OTC this would help with the cramps. Please advise SN thanks Last OV 05/30/12 11/27/12 Allergies  Allergen Reactions  . Metoprolol Succinate     REACTION: ? rash from Toprol ?  Marland Kitchen Penicillins     REACTION: allergic to penicillin

## 2012-06-11 NOTE — Telephone Encounter (Signed)
I spoke with pt and is aware of SN recs. Nothing further was needed

## 2012-07-04 ENCOUNTER — Other Ambulatory Visit: Payer: Self-pay | Admitting: Pulmonary Disease

## 2012-11-27 ENCOUNTER — Ambulatory Visit (INDEPENDENT_AMBULATORY_CARE_PROVIDER_SITE_OTHER): Payer: Medicare Other | Admitting: Pulmonary Disease

## 2012-11-27 ENCOUNTER — Encounter: Payer: Self-pay | Admitting: Pulmonary Disease

## 2012-11-27 VITALS — BP 104/76 | HR 97 | Temp 96.9°F | Ht 64.0 in | Wt 164.2 lb

## 2012-11-27 DIAGNOSIS — M545 Low back pain, unspecified: Secondary | ICD-10-CM

## 2012-11-27 DIAGNOSIS — R739 Hyperglycemia, unspecified: Secondary | ICD-10-CM | POA: Insufficient documentation

## 2012-11-27 DIAGNOSIS — E559 Vitamin D deficiency, unspecified: Secondary | ICD-10-CM

## 2012-11-27 DIAGNOSIS — E039 Hypothyroidism, unspecified: Secondary | ICD-10-CM

## 2012-11-27 DIAGNOSIS — R7303 Prediabetes: Secondary | ICD-10-CM | POA: Insufficient documentation

## 2012-11-27 DIAGNOSIS — E1149 Type 2 diabetes mellitus with other diabetic neurological complication: Secondary | ICD-10-CM

## 2012-11-27 DIAGNOSIS — F411 Generalized anxiety disorder: Secondary | ICD-10-CM

## 2012-11-27 DIAGNOSIS — E7800 Pure hypercholesterolemia, unspecified: Secondary | ICD-10-CM

## 2012-11-27 DIAGNOSIS — M5030 Other cervical disc degeneration, unspecified cervical region: Secondary | ICD-10-CM

## 2012-11-27 DIAGNOSIS — K589 Irritable bowel syndrome without diarrhea: Secondary | ICD-10-CM

## 2012-11-27 DIAGNOSIS — G609 Hereditary and idiopathic neuropathy, unspecified: Secondary | ICD-10-CM

## 2012-11-27 DIAGNOSIS — I872 Venous insufficiency (chronic) (peripheral): Secondary | ICD-10-CM

## 2012-11-27 DIAGNOSIS — K2100 Gastro-esophageal reflux disease with esophagitis, without bleeding: Secondary | ICD-10-CM

## 2012-11-27 DIAGNOSIS — I10 Essential (primary) hypertension: Secondary | ICD-10-CM

## 2012-11-27 NOTE — Patient Instructions (Addendum)
Today we updated your med list in our EPIC system...    Continue your current medications the same...  Be sure to check your feet carefully several times a day...  Call for any questions...  Let's plan a follow up visit in 35mo, sooner if needed for problems.Marland KitchenMarland Kitchen

## 2012-11-27 NOTE — Progress Notes (Signed)
Subjective:    Patient ID: Katie [name], female    DOB: 30-Oct-1945, 67 y.o.   MRN: [id number]  HPI 67 y/o BF here for a follow up visit... he has multiple medical problems including HBP;  Hyperchol, DM, & Hypothyroid followed by DrAltheimer;  Neck & Back pain followed by DrCabbell & DrKramer;  Osteopenia & Vit D defic;  Anemia & Vit B12 defic...   ~  November 30, 2010:  43mo ROV & she is stable overall, requesting refill Xanax & renewal of handicap sticker;  BP controlled on meds;  She continues to f/u w/ DrAltheimer every 3-4 months w/ extensive labs each visit & his notes are reviewed> DM, Chol, Thyroid, etc (See prob list below)...    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 In light of her concern for the dizziness & her mult risk factors- we will refer to Neuro to eval her stroke risk, continue ASA 81mg /d, & we reviewed EPLEY maneuver for BPPV in the meanwhile...  ~  May 31, 2011:  68mo ROV & Katie [name] tells me she was seen by Dalphine Handing, Neurology & had nerve conduction tests 10/12 but doesn't know the results> we reviewed consult note 8/12 (reviewed w/ the pt)> seen for dizziness (BPPV- treated w/ Epley maneuvers), hx chr LBP, right leg numbness, MRI Lumbar sp 5/12 & EMG 10/11 by [facility]> she felt c/w L5/ S1 radiculopathy & wanted to repeat the EMG/ NCV;  She also saw DrCabbell 10/12 w/ the same questions & his note is reviewed...    >HBP controlled w/ her 3 med regimen: Norvasc5, Lisinopril10, Lasix20; BP= 120/88 & she denies CP, palpit, SOB, edema...    >She is followed regularly by DrAltheimer & he does her labs; DM controlled on Byetta 53mcgBid & A1c 11/12 was 5.7...    >DrA  also treats her Chol w/ Crestor 10mg /d w/ good control of her lipids...    >She take Synthroid88 & her TSH 11/12 was 0.74 at DrA's office...    >Finally she mentioned some vague abd discomfort "something doesn't feel right in there"; she prev saw DrFore, now DrMezer; she had surg 2003 by DrFore & DNewman for TAH, vesicovag fistula closure, oopherectomy & femoral hernia repair; her GI is DrStark & I offered her GI f/u, appt w/ DrNewman, or CT Abd & Pelvis first & that is what she wanted...  ~  November 29, 2011:  24mo ROV & Aliviyah reports feeling pretty well in general; her CC is numbness under the ball of her right foot w/ eval by Dalphine Handing 1/13 & her EMG/NCV showed mild chr bilat L4 radic; prev BPPV improved w/ Epley maneuvers...    She is followed regularly by DrAltheimer & he does her labs every 3-4 months> last note 3/13 reviewed: DM- on Byetta w/ weight down 15# to 176# and A1c=5.6;  Lipids- on Cres10 & FLP at goals;  HBP- controlled on 7meds;  Hypothy- stable on [id number];  VitD & VitB12- ok on supplements...    We reviewed prob list, meds, xrays and labs> see below>>   ~  May 30, 2012:  37mo ROV &  Katie [name] is feeling well, exercising at the gym 4-5d/wk and no new complaints or concerns; she remains under considerable stress w/ her husband's health issues and drug abuse... We reviewed the following medical problems during today's office visit >>     HBP> on ASA81, Amlod5, Lisin10, Lasix20; BP= 120/82 & denies CP, palpit, dizzy, SOB, edema, etc...    VenInsuffic, DVT> on ASA81; she knows to avoid sodium, elev legs, wear support hose...    CHOL> on Cres20-1/2; labs done regularly by DrAltheimer who sees her Q59mo; FLP 7/13 showed TChol 112, TG 77, HDL 60, LDL 23...    DM> on Diet alone; labs per DrAlt showed ave BS= 122 and A1c=5.8    Hypothy> on Levothy88; TSH 7/13 by DrAlt showed TSH= 0.18    GERD, IBS, Polyps> stable- denies abd pain, n/v, d/c, blood seen; last colon 1/10 had several adenomas removed & f/u  planned 73yrs...    UTI's> followed by DrMacDiarmid & she denies recent urinary symptoms...    DJD, DDD, Cspine dis & LBP> on Robaxin500Tid, Tramadol50Tid;     Hyperuricemia> on Allopurinol300 but she wants to stop; Uric acid 7/13 by DrAlt = 4.3; she will decide about stopping the Allpourinol...    VitD defic> on VitD50K/wk; labs 7/13 by DrAlt showed VitD= 50; continue same...    Anxiety> on Xanax0.[id number]; it is working well 7 she wants to contuinue same...    Anemia> on B12-1000mg /d & B12 level 7/13 = 323 We reviewed prob list, meds, xrays and labs> see below for updates >> she had the 2013 flu vaccine; Rx written for Shingles vaccine...   ~  November 27, 2012:  36mo ROV & Katie [name] is c/o some LBP; under a lot of stress w/ husb's problems, hard for Sivan to cope;  We reviewed the following medical problems during today's office visit >>     HBP> on ASA81, Amlod5, Lisin10, Lasix20; BP= 104/76 & denies CP, palpit, dizzy, SOB, edema, etc...    VenInsuffic, DVT> on ASA81; she knows to avoid sodium, elev legs, wear support hose...    CHOL> on Cres20-1/2; labs done regularly by DrAltheimer who sees her Q59mo; [place] 3/14 showed TChol 124, TG 49, HDL 73, LDL 25...    DM> on Byetta Bid; labs per DrAlt 3/14 showed ave BS= 115 and A1c=5.7, no retinopathy per DrShapiro 1/14... Has neuropathy.    Hypothy> on Levothy88; TSH 3/14 by DrAlt showed TSH= 0.10 & he kept the dose the same...    GERD, IBS, Polyps> stable- denies abd pain, n/v, d/c, blood seen; last colon 1/10 had several adenomas removed & f/u planned 65yrs...    UTI's> followed by DrMacDiarmid & she denies recent urinary symptoms...    DJD, DDD, Cspine dis & LBP> on Robaxin500Tid, Tramadol50Tid;     Neuropathy>     Hyperuricemia> prev on Allopurinol300 but she stopped; Uric acid 7/13 by DrAlt = 4.3 on med; she decided to stop the Allopurinol- Uric 3/14 = 6.0, no recurrent prob so far...    VitD defic> on VitD50K/wk; labs 3/14 by DrAlt showed VitD= 54;  continue same...    Anxiety> on Xanax0.[id number]; it is working well 7 she wants to contuinue same...    Anemia> on B12-1000mg /d & B12 level 3/14 per DrAlt = 864 We reviewed prob list, meds, xrays and labs> see below for updates >>  LABS OBTAINED FROM DrAltheimer's 3/14 note...    Problem List:  ESSENTIAL HYPERTENSION - controlled on ASA 81mg /d, NORVASC 10mg - 1/2 tab, LISINOPRIL 20mg -  1/2 tab, & LASIX 20mg /d... not checking BP's at home but OK at [facility], DrAltheimers office, etc... ~  1/13: BP=120/88 & she denies HA, fatigue, visual changes, CP, palipit, dizziness, syncope, dyspnea, edema, etc... ~  7/13: BP= 102/72 & she remains asymptomatic... ~  1/14: on ASA81, Amlod5, Lisin10, Lasix20; BP= 120/82 & denies CP, palpit, dizzy, SOB, edema, etc. ~  7/14: on ASA81, Amlod5, Lisin10, Lasix20; BP= 104/76 & she remains asymptomatic.  Hx of DEEP VENOUS THROMBOPHLEBITIS, VENOUS INSUFFICIENCY - she takes ASA 81mg /d, avoids sodium, elevates legs and wears support hose as needed... ~  [place] Do[place] 5/11 = neg for DVT...  HYPERCHOLESTEROLEMIA (ICD-272.0) - on CRESTOR 20mg - taking 1/2 tab per DrAltheimer & well controlled> he does her full labs every 65months... his latest note & labs from his office reviewed w/ the pt... ~  [place] 12/11 from DrAltheimer on Cres10 showed TChol 137, TG 105, HDL 87, LDL 51 ~  FLP 7/12 from DrA on Cres10 showed TChol 135, TG 59, HDL 70, LDL 31 ~  FLP 3/13 from DrA on Cres10 showed TChol 134, TG -- , HDL 57, LDL 41 ~  on Cres20-1/2; labs done regularly by DrAltheimer who sees her Q52mo; FLP 7/13 showed TChol 112, TG 77, HDL 60, LDL 23 ~  FLP 3/14 from DrA on Cres10 showed TChol 124, TG 49, HDL 73, LDL 25  DIABETES MELLITUS, TYPE II (ICD-250.00) - on BYETTA 64mcgBid per DrAltheimer & off the prev Metformin w/ good control & A1c's around 6.0 (and wt loss of 50# from the Byetta)... diet counselling and DM education thru his staff and full labs checked every 3 months as  noted... She sees DrShapiro for eyes (no retinopathy), & DrAljouny for feet (monofilament normal), no known renal dis... ~  Labs 12/11 from DrAltheimer on Metform+Byetta showed BS=131, A1c=6.3 ~  Labs 7/12 from [place] on Byetta showed BS= 138, A1c= 6.3 ~  Labs 3/13 from DrA on Byetta showed BS ave= 127, A1c= 5.6 ~  ?on Diet alone; labs 7/13 per DrAlt showed ave BS= 122 and A1c=5.8 ~  Labs 3/14 from DrA on Byetta showed ave BS= 115 and A1c=5.7  HYPOTHYROIDISM (ICD-244.9) - on SYNTHROID 40mcg/d & labs monitored every 8months by DrAltheimer> stable on this dose w/ TSH~1.0 range... ~  Labs 12/11 from DrAltheimer on Synth88 showed TSH= 0.22 ~  Labs 7/12 from [place] on Synth88 showed TSH= 0.82 ~  Labs 3/13 from [place] on Synth88 showed TSH= 0.47 ~  on Levothy88; TSH 7/13 by DrAlt showed TSH= 0.18 ~  Labs 3/14 from [place] on Synth88 showed TSH= 0.10 & he kept the dose the same.  REFLUX ESOPHAGITIS, IRRITABLE BOWEL SYNDROME, COLONIC POLYPS, ADENOMATOUS -  ~  colonoscopy 5/04 by DrStark - adenomatous polyp removed, +hems... ~  f/u colonoscopy 1/10 by DrStark w/ hems & several polyps removed- adenomatous, f/u planned 16yrs. ~  CT Abd 1/13 showed gallstones, s/p hyst, otherw neg w/o lesions etc...  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 he does B12 levels on her as well every 3 months... ~  Labs 12/11 from DrAltheimer's office showed Vit B12 level = 51 ~  Labs 7/12 on B12 SQ monthly showed Vit B12 level = 377 ~  Labs 3/13 on B12 shots monthly showed Vit B12 level = 374 ~  on B12-1000mg /d & B12 level 7/13 = 323  ~  Labs 3/14 on B12 shots monthly showed B12 level = 864  UTI'S, RECURRENT (ICD-599.0) - she is followed by DrMezer for Gyn & she reports neg PAPs... he sent her to DrMacDiarmid for Urology 9/10 for pyuria/ proteinuria/ sm angiomyolipoma upper pole left kidney (otherw neg upper & lower track eval by  Urology).  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 Eval & Rx by [facility] 3/02 w/ multilevel DDD - C4-5 C5-6 C6-7;  conservative Rx & improved... Eval & Rx by DrKramer & Joie Bimler 9/07 - L2 compression & pelvic fx after fall... Treated w/ series Lumbar epidural steroid shots & improved... ~  we have given her TRAMADOL 50mg  Tid as needs & ROBAXIN 500mg  Tid Prn... ~  Labs 12/11 from DrAltheimer's office on [place] showed Uric=4.1 ~  Labs 7/12 from [place] on Allopurinol 300mg /d showed Uric= 5.3 ~  Labs 3/13 from [place] on Allopurinol 300mg /d showed Uric = 4.3 ~  12/13: MRI Lumbar sp per DrCabbell showed severe L4-5 facet arthritis, mod right L5-S1 facet arthritis... ~  Labs 3/14 from [place] off Allopurinol rx showed Uric= 6.0  VITAMIN D DEFICIENCY (ICD-268.9) - on Vit D 50000u weekly... ~  BMD 2/09 normal w/ TScores +2.4 in spine, and -0.7 in fem neck...  2/09 VitD level only 14 (30-90) & started on VitD 50K/wk...  ~  labs 5/11 by DrAltheimer showed Vit D level 53... rec- continue 50K/wk & he continues to check her levels every 3-6 months. ~  Labs 7/12 on VitD 50K weekly by DrA showed VitD level = 49 ~  Labs 3/13 on VitD 50K weekly showed Vit D level = 60 ~  Labs 3/14 from DrA on VitD 50K weekly showed Vit D level = 54  Hx of CARPAL TUNNEL SYNDROME, BILATERAL - Eval & Rx by [facility] & DrSypher in 2001 w/ wrist splints & improved.  Hx of PSEUDOTUMOR CEREBRI & PARESTHESIA - Eval & Rx by DrBrewington/Tyson & Gean Quint 1984 - treated w/ Diamox (headaches resolved).  ANXIETY, OTHER ISOLATED OR SPECIFIC PHOBIAS -  mult somatic complaints - on XANAX 0.5mg  prn... she can't/ won't swallow big pills...  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  Hx of IRITIS (ICD-364.3) - Eval DrTyson 12/05 was neg.   Past Surgical History  Procedure Laterality  Date  . Vesicovaginal fistula closure w/ tah    . Oophorectomy    . Femoral hernia repair      Outpatient Encounter Prescriptions as of 11/27/2012  Medication Sig Dispense Refill  . ALPRAZolam (XANAX) 0.5 MG tablet TAKE ONE TABLET BY MOUTH THREE TIMES DAILY AS NEEDED  90 tablet  2  . amLODipine (NORVASC) 5 MG tablet Take 1 tablet (5 mg total) by mouth daily.  90 tablet  3  . aspirin 81 MG EC tablet Take 81 mg by mouth daily.        . cyanocobalamin (,VITAMIN B-12,) 1000 MCG/ML injection USE AS DIRECTED  10 mL  1  . ergocalciferol (VITAMIN D2) 50000 UNITS capsule Take 50,000 Units by mouth once a week.        Marland Kitchen exenatide (BYETTA 10 MCG PEN) 10 MCG/0.04ML SOLN Inject 10 mcg into the skin 2 (two) times daily with a meal.        . furosemide (LASIX) 20 MG tablet Take 1 tablet (20 mg total) by mouth daily.  90 tablet  3  . levothyroxine (SYNTHROID, LEVOTHROID) 88 MCG tablet Take 88 mcg by mouth daily.        Marland Kitchen lisinopril (PRINIVIL,ZESTRIL) 10 MG tablet Take 1 tablet (10 mg total) by mouth daily.  90 tablet  3  . methocarbamol (ROBAXIN) 500 MG tablet Take 1 tablet (500 mg total) by mouth 3 (three) times daily.  90 tablet  5  . rosuvastatin (CRESTOR) 20 MG tablet Take 1/2 daily       . traMADol (ULTRAM) 50 MG tablet Take 1 tablet (50 mg total) by mouth every 8 (eight) hours as needed.  90 tablet  5   No facility-administered encounter medications on file as of 11/27/2012.    Allergies  Allergen Reactions  . Metoprolol Succinate     REACTION: ? rash from Toprol ?  Marland Kitchen Penicillins     REACTION: allergic to penicillin    Current Medications, Allergies, Past Medical History, Past Surgical History, Family History, and Social History were reviewed in [employer] record.   Review of Systems         See HPI - all other systems neg except as noted...  The patient complains of decreased hearing and dyspnea on exertion.  The patient denies anorexia, fever, weight loss, weight  gain, vision loss, hoarseness, chest pain, syncope, peripheral edema, prolonged cough, headaches, hemoptysis, abdominal pain, melena, hematochezia, severe indigestion/heartburn, hematuria, incontinence, muscle weakness, suspicious skin lesions, transient blindness, difficulty walking, depression, unusual weight change, abnormal bleeding, enlarged lymph nodes, and angioedema.     Objective:   Physical Exam     WD, WN, 67 y/o BF in NAD... GENERAL:  Alert & oriented; pleasant & cooperative... HEENT:  [place]/AT, EOM-wnl, PERRLA, EACs-clear, TMs-wnl, NOSE-clear, THROAT-clear & wnl. NECK:  Supple w/ fairROM; no JVD; normal carotid impulses w/o bruits; no thyromegaly or nodules palpated; no lymphadenopathy. CHEST:  Clear to P & A; without wheezes/ rales/ or rhonchi. HEART:  Regular Rhythm; gr 1/6 SEM without rubs or gallops heard... ABDOMEN:  Obese, soft & nontender; + abd panniculus, normal bowel sounds; no organomegaly or masses detected. EXT: without deformities, mild arthritic changes; no varicose veins/ +venous insuffic/ no edema. NEURO:  CN's intact; no focal neuro deficits... DERM:  No lesions noted; no rash etc...  RADIOLOGY DATA:  Reviewed in the EPIC EMR & discussed w/ the patient...    >>CT Abd & Pelvis is pending...  LABORATORY DATA:  Reviewed in the EPIC EMR & discussed w/ the patient...   Assessment & Plan:    HBP>  Controlled on Norvasc, Lisinopril, Lasix; continue same + diet, exercise, wt reduction...  Ven Insuffi, Hx DVT>  On ASA, advised no salt, elevation, support hose...  CHOL>  Well controlled on Crestor 10mg /d, continue same...  DM>  Followed by DrAltheimer & well controlled on Metformin & Byetta...  HYPOTHYROID>  Well controlled on Synthroid 75mcg/d...  GI> GERD, IBS, Polyps>  Stable & up to date on screening...  GU> followed by DrMacDiarmid for recurrent UTIs, sm angiomyolipoma left kidney> stable...  DJD, CSpine spondy, LBP>  Followed by Joie Bimler et al w/  epidural steroid shots in May12 & Jun12- improved she says...  LBP, Paresthesia, leg symptoms>  Followed by Dalphine Handing for Neuro & DrCabbell for NS...  Hyperuricemia> prev on Allopurinol daily w/ good control of Uric level on testing per DrAltheimer; she decided to stop this med, DrA still follows.  Vit D Deficiency>  On 50K weekly per DrAltheimer who monitors her levels...  B12 Deficiency>  On  B12 shots monthly by city nurse & B12 level followed by DrAltheimer...  Anxiety>  Stable on Alprazolam, and she requests refill Rx...   Patient's Medications  New Prescriptions   No medications on file  Previous Medications   ALPRAZOLAM (XANAX) 0.5 MG TABLET    TAKE ONE TABLET BY MOUTH THREE TIMES DAILY AS NEEDED   AMLODIPINE (NORVASC) 5 MG TABLET    Take 1 tablet (5 mg total) by mouth daily.   ASPIRIN 81 MG EC TABLET    Take 81 mg by mouth daily.     CYANOCOBALAMIN (,VITAMIN B-12,) 1000 MCG/ML INJECTION    USE AS DIRECTED   ERGOCALCIFEROL (VITAMIN D2) 50000 UNITS CAPSULE    Take 50,000 Units by mouth once a week.     EXENATIDE (BYETTA 10 MCG PEN) 10 MCG/0.04ML SOLN    Inject 10 mcg into the skin 2 (two) times daily with a meal.     FUROSEMIDE (LASIX) 20 MG TABLET    Take 1 tablet (20 mg total) by mouth daily.   LEVOTHYROXINE (SYNTHROID, LEVOTHROID) 88 MCG TABLET    Take 88 mcg by mouth daily.     LISINOPRIL (PRINIVIL,ZESTRIL) 10 MG TABLET    Take 1 tablet (10 mg total) by mouth daily.   METHOCARBAMOL (ROBAXIN) 500 MG TABLET    Take 1 tablet (500 mg total) by mouth 3 (three) times daily.   ROSUVASTATIN (CRESTOR) 20 MG TABLET    Take 1/2 daily    TRAMADOL (ULTRAM) 50 MG TABLET    Take 1 tablet (50 mg total) by mouth every 8 (eight) hours as needed.  Modified Medications   No medications on file  Discontinued Medications   No medications on file

## 2012-12-26 ENCOUNTER — Telehealth (INDEPENDENT_AMBULATORY_CARE_PROVIDER_SITE_OTHER): Payer: Self-pay | Admitting: General Surgery

## 2012-12-26 NOTE — Telephone Encounter (Signed)
Pt's wife called to report they spoke with "the liver doctor clinic" at [facility], but declined the appt and was placed in the inactive file there.  Since then they met with the oncologist and he recommended they proceed with scheduling the appt there.  I was able to provide her the phone number [phone]) to call and set up her appt.

## 2013-01-14 ENCOUNTER — Telehealth: Payer: Self-pay | Admitting: Pulmonary Disease

## 2013-01-14 ENCOUNTER — Other Ambulatory Visit: Payer: Self-pay | Admitting: Pulmonary Disease

## 2013-01-14 MED ORDER — FUROSEMIDE 20 MG PO TABS: 20.0000 mg | ORAL_TABLET | Freq: Every day | ORAL | Status: DC

## 2013-01-14 MED ORDER — ALPRAZOLAM 0.5 MG PO TABS: ORAL_TABLET | ORAL | Status: DC

## 2013-01-14 MED ORDER — TRAMADOL HCL 50 MG PO TABS: 50.0000 mg | ORAL_TABLET | Freq: Three times a day (TID) | ORAL | Status: DC | PRN

## 2013-01-14 NOTE — Telephone Encounter (Signed)
I spoke with pt and she needed refill on lasix, alprazolam, and tramadol. I advised pt will send in RX's nothing further needed

## 2013-01-29 ENCOUNTER — Encounter: Payer: Self-pay | Admitting: Pulmonary Disease

## 2013-03-04 ENCOUNTER — Other Ambulatory Visit: Payer: Self-pay | Admitting: Neurosurgery

## 2013-03-04 DIAGNOSIS — M4310 Spondylolisthesis, site unspecified: Secondary | ICD-10-CM

## 2013-03-15 ENCOUNTER — Ambulatory Visit
Admission: RE | Admit: 2013-03-15 | Discharge: 2013-03-15 | Disposition: A | Discharge status: Home / Self Care | Payer: Medicare Other | Source: Ambulatory Visit | Attending: Neurosurgery | Admitting: Neurosurgery

## 2013-03-15 DIAGNOSIS — M4310 Spondylolisthesis, site unspecified: Secondary | ICD-10-CM

## 2013-04-25 ENCOUNTER — Encounter: Payer: Self-pay | Admitting: Gastroenterology

## 2013-05-26 ENCOUNTER — Ambulatory Visit: Payer: Medicare Other | Admitting: Pulmonary Disease

## 2013-05-26 LAB — HM DIABETES EYE EXAM

## 2013-06-14 ENCOUNTER — Other Ambulatory Visit: Payer: Self-pay | Admitting: Pulmonary Disease

## 2013-06-23 ENCOUNTER — Encounter: Payer: Self-pay | Admitting: Gastroenterology

## 2013-07-15 ENCOUNTER — Encounter: Payer: Self-pay | Admitting: Pulmonary Disease

## 2013-07-16 ENCOUNTER — Encounter (INDEPENDENT_AMBULATORY_CARE_PROVIDER_SITE_OTHER): Payer: Self-pay

## 2013-07-16 ENCOUNTER — Encounter: Payer: Self-pay | Admitting: Pulmonary Disease

## 2013-07-16 ENCOUNTER — Ambulatory Visit (INDEPENDENT_AMBULATORY_CARE_PROVIDER_SITE_OTHER): Payer: Medicare Other | Admitting: Pulmonary Disease

## 2013-07-16 VITALS — BP 124/76 | HR 113 | Temp 97.1°F | Ht 64.0 in | Wt 173.8 lb

## 2013-07-16 DIAGNOSIS — H6120 Impacted cerumen, unspecified ear: Secondary | ICD-10-CM

## 2013-07-16 DIAGNOSIS — E1149 Type 2 diabetes mellitus with other diabetic neurological complication: Secondary | ICD-10-CM

## 2013-07-16 DIAGNOSIS — I10 Essential (primary) hypertension: Secondary | ICD-10-CM

## 2013-07-16 DIAGNOSIS — E039 Hypothyroidism, unspecified: Secondary | ICD-10-CM

## 2013-07-16 DIAGNOSIS — E7800 Pure hypercholesterolemia, unspecified: Secondary | ICD-10-CM

## 2013-07-16 DIAGNOSIS — I872 Venous insufficiency (chronic) (peripheral): Secondary | ICD-10-CM

## 2013-07-16 MED ORDER — AMLODIPINE BESYLATE 5 MG PO TABS: ORAL_TABLET | ORAL | Status: DC

## 2013-07-16 MED ORDER — TRAMADOL HCL 50 MG PO TABS: 50.0000 mg | ORAL_TABLET | Freq: Three times a day (TID) | ORAL | Status: DC | PRN

## 2013-07-16 MED ORDER — METHOCARBAMOL 500 MG PO TABS: 500.0000 mg | ORAL_TABLET | Freq: Three times a day (TID) | ORAL | Status: DC

## 2013-07-16 MED ORDER — CYANOCOBALAMIN 1000 MCG/ML IJ SOLN: INTRAMUSCULAR | Status: DC

## 2013-07-16 MED ORDER — ALPRAZOLAM 0.5 MG PO TABS: ORAL_TABLET | ORAL | Status: DC

## 2013-07-16 MED ORDER — FUROSEMIDE 20 MG PO TABS: 20.0000 mg | ORAL_TABLET | Freq: Every day | ORAL | Status: DC

## 2013-07-16 MED ORDER — LISINOPRIL 10 MG PO TABS: ORAL_TABLET | ORAL | Status: DC

## 2013-07-16 NOTE — Patient Instructions (Signed)
Today we updated your med list in our EPIC system...    Continue your current medications the same...  We refilled your meds per request...  Call for any questions or if I can be of service in any way.Marland KitchenMarland Kitchen

## 2013-07-16 NOTE — Progress Notes (Signed)
Subjective:    Patient ID: Katie [name], female    DOB: [birth year], 68 y.o.   MRN: [id number]  HPI 68 y/o BF here for a follow up visit... he has multiple medical problems including HBP;  Hyperchol, DM, & Hypothyroid followed by DrAltheimer;  Neck & Back pain followed by DrCabbell & DrKramer;  Osteopenia & Vit D defic;  Anemia & Vit B12 defic...   ~  November 30, 2010:  45moROV & she is stable overall, requesting refill Xanax & renewal of handicap sticker;  BP controlled on meds;  She continues to f/u w/ DrAltheimer every 3-4 months w/ extensive labs each visit & his notes are reviewed> DM, Chol, Thyroid, etc (See prob list below)...    She has had MRI of her LSspine & 2 epidural steroid shots (May & June) by DHessie Dienerfor treatment of LBP & her back is improved but she is c/o dizziness & off balance ever since;  She has some numbness of her right foot w/ EMG that she says revealed a neuropathy in fingers & toes, she notes that heat helps but she is not in pain & doesn't want more meds... In light of her concern for the dizziness & her mult risk factors- we will refer to Neuro to eval her stroke risk, continue ASA 8975md, & we reviewed EPLEY maneuver for BPPV in the meanwhile...  ~  May 31, 2011:  75m71moV & Katie [name] tells me she was seen by DrYDalphine Handingeurology & had nerve conduction tests 10/12 but doesn't know the results> we reviewed consult note 8/12 (reviewed w/ the pt)> seen for dizziness (BPPV- treated w/ Epley maneuvers), hx chr LBP, right leg numbness, MRI Lumbar sp 5/12 & EMG 10/11 by [facility]> she felt c/w L5/ S1 radiculopathy & wanted to repeat the EMG/ NCV;  She also saw DrCabbell 10/12 w/ the same questions & his note is reviewed...    >HBP controlled w/ her 3 med regimen: Norvasc5, Lisinopril10, Lasix20; BP= 120/88 & she denies CP, palpit, SOB, edema...    >She is followed regularly by DrAltheimer & he does her labs; DM controlled on Byetta 24m25md & A1c 11/12 was 5.7...    >DrA  also treats her Chol w/ Crestor 24mg70m/ good control of her lipids...    >She take Synthroid88 & her TSH 11/12 was 0.74 at DrA's office...    >Finally she mentioned some vague abd discomfort "something doesn't feel right in there"; she prev saw DrFore, now DrMezer; she had surg 2003 by DrFore & DNewman for TAH, vesicovag fistula closure, oopherectomy & femoral hernia repair; her GI is DrStark & I offered her GI f/u, appt w/ DrNewman, or CT Abd & Pelvis first & that is what she wanted...  ~  November 29, 2011:  75mo R33mo Katie [name] reports feeling pretty well in general; her CC is numbness under the ball of her right foot w/ eval by DrYan Dalphine Handing& her EMG/NCV showed mild chr bilat L4 radic; prev BPPV improved w/ Epley maneuvers...    She is followed regularly by DrAltheimer & he does her labs every 3-4 months> last note 3/13 reviewed: DM- on Byetta w/ weight down 15# to 176# and A1c=5.6;  Lipids- on Cres10 & FLP at goals;  HBP- controlled on 3meds;63mypothy- stable on Levo88;[id number] & VitB12- ok on supplements...    We reviewed prob list, meds, xrays and labs> see below>>   ~  May 30, 2012:  75mo ROV82mo  Katie [name] is feeling well, exercising at the gym 4-5d/wk and no new complaints or concerns; she remains under considerable stress w/ her husband's health issues and drug abuse... We reviewed the following medical problems during today's office visit >>     HBP> on ASA81, Amlod5, Lisin10, Lasix20; BP= 120/82 & denies CP, palpit, dizzy, SOB, edema, etc...    VenInsuffic, DVT> on ASA81; she knows to avoid sodium, elev legs, wear support hose...    CHOL> on Cres20-1/2; labs done regularly by DrAltheimer who sees her Q88mo FLP 7/13 showed TChol 112, TG 77, HDL 60, LDL 23...    DM> on Diet alone; labs per DrAlt showed ave BS= 122 and A1c=5.8    Hypothy> on Levothy88; TSH 7/13 by DrAlt showed TSH= 0.18    GERD, IBS, Polyps> stable- denies abd pain, n/v, d/c, blood seen; last colon 1/10 had several adenomas removed & f/u  planned 538yr..    UTI's> followed by DrMacDiarmid & she denies recent urinary symptoms...    DJD, DDD, Cspine dis & LBP> on Robaxin500Tid, Tramadol50Tid;     Hyperuricemia> on Allopurinol300 but she wants to stop; Uric acid 7/13 by DrAlt = 4.3; she will decide about stopping the Allpourinol...    VitD defic> on VitD50K/wk; labs 7/13 by DrAlt showed VitD= 50; continue same...    Anxiety> on [id number] is working well 7 she wants to contuinue same...    Anemia> on B12-100011m & B12 level 7/13 = 323 We reviewed prob list, meds, xrays and labs> see below for updates >> she had the 2013 flu vaccine; Rx written for Shingles vaccine...   ~  November 27, 2012:  16mo546mo & PearBlindac/o some LBP; under a lot of stress w/ husb's problems, hard for Katie [name] to cope;  We reviewed the following medical problems during today's office visit >>     HBP> on ASA81, Amlod5, Lisin10, Lasix20; BP= 104/76 & denies CP, palpit, dizzy, SOB, edema, etc...    VenInsuffic, DVT> on ASA81; she knows to avoid sodium, elev legs, wear support hose...    CHOL> on Cres20-1/2; labs done regularly by DrAltheimer who sees her Q46mo;50mo [place] showed TChol 124, TG 49, HDL 73, LDL 25...    DM> on Byetta Bid; labs per DrAlt 3/14 showed ave BS= 115 and A1c=5.7, no retinopathy per DrShapiro 1/14... Has neuropathy.    Hypothy> on Levothy88; TSH 3/14 by DrAlt showed TSH= 0.10 & he kept the dose the same...    GERD, IBS, Polyps> stable- denies abd pain, n/v, d/c, blood seen; last colon 1/10 had several adenomas removed & f/u planned 47yrs.39yr  UTI's> followed by DrMacDiarmid & she denies recent urinary symptoms...    DJD, DDD, Cspine dis & LBP> on Robaxin500Tid, Tramadol50Tid;     Neuropathy>     Hyperuricemia> prev on Allopurinol300 but she stopped; Uric acid 7/13 by DrAlt = 4.3 on med; she decided to stop the Allopurinol- Uric 3/14 = 6.0, no recurrent prob so far...    VitD defic> on VitD50K/wk; labs 3/14 by DrAlt showed VitD= 54;  continue same...    Anxiety> on [id number] working well 7 she wants to contuinue same...    Anemia> on B12-1000mg/d39m12 level 3/14 per DrAlt = 864 We reviewed prob list, meds, xrays and labs> see below for updates >>   LABS OBTAINED FROM DrAltheimer's 3/14 note...  ~  July 16, 2013:  58mo ROV758moearl continues to see DrAltheimer every 46mo w/20mo  labs done at each visit;  His notes are reviewed...     BP is controlled w/ Amlod5, Lisin10, Lasix20;  BP= 124/76 & note is made of a 10# wt gain- we reviewed diet, exercise, wt reduction strategies...    She remains on Crestor20-taking 1/2 tab daily & labs done by DrAltheimer...    She continues on byetta shots under the direction of endocrine & again labs are monitored by DrA...    She continues on Synthroid88 per drA...     She takes B12 shots and Vit d supplements...    We fill her alprazolam 0.84m for prn use...  We reviewed prob list, meds, xrays and labs> see below for updates >>   LABS OBTAINED FROM DrAltheimer's 12/14 note    Problem List:  ESSENTIAL HYPERTENSION - controlled on ASA 821md, NORVASC 1070m1/2 tab, LISINOPRIL 39m12m/2 tab, & LASIX 39mg63m. not checking BP's at home but OK at [facility], DrAltheimers office, etc... ~  1/13: BP=120/88 & she denies HA, fatigue, visual changes, CP, palipit, dizziness, syncope, dyspnea, edema, etc... ~  7/13: BP= 102/72 & she remains asymptomatic... ~  1/14: on ASA81, Amlod5, Lisin10, Lasix20; BP= 120/82 & denies CP, palpit, dizzy, SOB, edema, etc. ~  7/14: on ASA81, Amlod5, Lisin10, Lasix20; BP= 104/76 & she remains asymptomatic.  Hx of DEEP VENOUS THROMBOPHLEBITIS, VENOUS INSUFFICIENCY - she takes ASA 81mg/59mvoids sodium, elevates legs and wears support hose as needed... ~  Ven DoLawson Fiscalers 5/11 = neg for DVT...  HYPERCHOLESTEROLEMIA (ICD-272.0) - on CRESTOR 39mg- 4mng 1/2 tab per DrAltheimer & well controlled> he does her full labs every 3months43month latest note &  labs from his office reviewed w/ the pt... ~  FLP 12/1Homerrom DrAltheimer on Cres10 showed TChol 137, TG 105, HDL 87, LDL 51 ~  FLP 7/12 from DrA on Cres10 showed TChol 135, TG 59, HDL 70, LDL 31 ~  FLP 3/13 from DrA on Cres10 showed TChol 134, TG -- , HDL 57, LDL 41 ~  on Cres20-1/2; labs done regularly by DrAltheimer who sees her Q4mo; FLP92mo3 showed TChol 112, TG 77, HDL 60, LDL 23 ~  FLP 3/14 from DrA on Cres10 showed TChol 124, TG 49, HDL 73, LDL 25  DIABETES MELLITUS, TYPE II (ICD-250.00) - on BYETTA 10mcgBid 2mDrAltheimer & off the prev Metformin w/ good control & A1c's around 6.0 (and wt loss of 50# from the Byetta)... diet counselling and DM education thru his staff and full labs checked every 3 months as noted... She sees DrShapiro for eyes (no retinopathy), & DrAljouny for feet (monofilament normal), no known renal dis... ~  Labs 12/11 from DrAltheimer on Metform+Byetta showed BS=131, A1c=6.3 ~  Labs 7/12 from DrA on [place] showed BS= 138, A1c= 6.3 ~  Labs 3/13 from DrA on Byetta showed BS ave= 127, A1c= 5.6 ~  ?on Diet alone; labs 7/13 per DrAlt showed ave BS= 122 and A1c=5.8 ~  Labs 3/14 from DrA on Byetta showed ave BS= 115 and A1c=5.7  HYPOTHYROIDISM (ICD-244.9) - on SYNTHROID 88mcg/d & 40m monitored every 3months by 68monthimer> stable on this dose w/ TSH~1.0 range... ~  Labs 12/11 from DrAltheimer on Synth88 showed TSH= 0.22 ~  Labs 7/12 from DrA on SynthSayreshowed TSH= 0.82 ~  Labs 3/13 from DrA on SynthAncient Oaksshowed TSH= 0.47 ~  on Levothy88; TSH 7/13 by DrAlt showed TSH= 0.18 ~  Labs 3/14 from DrA on SynthGreenvilleshowed TSH= 0.10 & he kept the dose  the same.  REFLUX ESOPHAGITIS, IRRITABLE BOWEL SYNDROME, COLONIC POLYPS, ADENOMATOUS -  ~  colonoscopy 5/04 by DrStark - adenomatous polyp removed, +hems... ~  f/u colonoscopy 1/10 by DrStark w/ hems & several polyps removed- adenomatous, f/u planned 55yr. ~  CT Abd 1/13 showed gallstones, s/p hyst, otherw neg w/o lesions etc...  Hx  of IRON DEFICIENCY ANEMIA SECONDARY TO BLOOD LOSS &  VITAMIN B12 DEFICIENCY - on B12 shots 10011m Sq monthly given by city nurse at work... he does B12 levels on her as well every 3 months... ~  Labs 12/11 from DrAltheimer's office showed Vit B12 level = 51 ~  Labs 7/12 on B12 SQ monthly showed Vit B12 level = 377 ~  Labs 3/13 on B12 shots monthly showed Vit B12 level = 374 ~  on B12-100079m & B12 level 7/13 = 323  ~  Labs 3/14 on B12 shots monthly showed B12 level = 864  UTI'S, RECURRENT (ICD-599.0) - she is followed by DrMezer for Gyn & she reports neg PAPs... he sent her to DrMacDiarmid for Urology 9/10 for pyuria/ proteinuria/ sm angiomyolipoma upper pole left kidney (otherw neg upper & lower track eval by Urology).  DEGENERATIVE DISC DISEASE, CERVICAL SPINE;  LOW BACK PAIN, CHRONIC; &  HYPERURICEMIA - on ALLOPURINOL 300m95m.. [facility] multilevel DDD - C4-5 C5-6 C6-7;  conservative Rx & improved... Eval & Rx by DrKramer & DrCaJoie Bimler7 - L2 compression & pelvic fx after fall... Treated w/ series Lumbar epidural steroid shots & improved... ~  we have given her TRAMADOL 50mg81m as needs & ROBAXIN 500mg 69mPrn... ~  Labs 12/11 from DrAltheimer's office on Allop3[place] Uric=4.1 ~  Labs 7/12 from DrA onCarnationlopurinol 300mg/d64mwed Uric= 5.3 ~  Labs 3/13 from DrA on [place] 300mg/d 57med Uric = 4.3 ~  12/13: MRI Lumbar sp per DrCabbell showed severe L4-5 facet arthritis, mod right L5-S1 facet arthritis... ~  Labs 3/14 from DrA off [place] rx showed Uric= 6.0  VITAMIN D DEFICIENCY (ICD-268.9) - on Vit D 50000u weekly... ~  BMD 2/09 normal w/ TScores +2.4 in spine, and -0.7 in fem neck...  2/09 VitD level only 14 (30-90) & started on VitD 50K/wk...  ~  labs 5/11 by DrAltheimer showed Vit D level 53... rec- continue 50K/wk & he continues to check her levels every 3-6 months. ~  Labs 7/12 on VitD 50K weekly by DrA showed VitD level = 49 ~  Labs 3/13 on VitD  50K weekly showed Vit D level = 60 ~  Labs 3/14 from DrA on VitD 50K weekly showed Vit D level = 54  Hx of CARPAL TUNNEL SYNDROME, BILATERAL - Eval & [facility] 2001 w/ wrist splints & improved.  Hx of PSEUDOTUMOR CEREBRI & PARESTHESIA - Eval & Rx by DrBrewington/Tyson & DrLove 1Gean Quinttreated w/ Diamox (headaches resolved).  ANXIETY, OTHER ISOLATED OR SPECIFIC PHOBIAS -  mult somatic complaints - on XANAX 0.5mg prn.62mshe can't/ won't swallow big pills...  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  Hx of IRITIS (ICD-364.3) - Eval DrTyson 12/05 was neg.   Past Surgical History  Procedure Laterality Date  . Vesicovaginal fistula closure w/ tah    . Oophorectomy    . Femoral hernia repair      Outpatient Encounter Prescriptions as of 07/16/2013  Medication Sig  .  ALPRAZolam (XANAX) 0.5 MG tablet TAKE ONE TABLET BY MOUTH THREE TIMES DAILY AS NEEDED  . amLODipine (NORVASC) 5 MG tablet TAKE ONE TABLET BY MOUTH EVERY DAY  . aspirin 81 MG EC tablet Take 81 mg by mouth daily.    . cyanocobalamin (,VITAMIN B-12,) 1000 MCG/ML injection USE AS DIRECTED  . ergocalciferol (VITAMIN D2) 50000 UNITS capsule Take 50,000 Units by mouth once a week.    Marland Kitchen exenatide (BYETTA 10 MCG PEN) 10 MCG/0.04ML SOLN Inject 10 mcg into the skin 2 (two) times daily with a meal.    . furosemide (LASIX) 20 MG tablet Take 1 tablet (20 mg total) by mouth daily.  Marland Kitchen levothyroxine (SYNTHROID, LEVOTHROID) 88 MCG tablet Take 88 mcg by mouth daily.    Marland Kitchen lisinopril (PRINIVIL,ZESTRIL) 10 MG tablet TAKE ONE TABLET BY MOUTH EVERY DAY  . methocarbamol (ROBAXIN) 500 MG tablet Take 1 tablet (500 mg total) by mouth 3 (three) times daily.  . rosuvastatin (CRESTOR) 20 MG tablet Take 1/2 daily   . traMADol (ULTRAM) 50 MG tablet Take 1 tablet (50 mg total) by mouth every 8 (eight) hours as needed.    Allergies  Allergen  Reactions  . Metoprolol Succinate     REACTION: ? rash from Toprol ?  Marland Kitchen Penicillins     REACTION: allergic to penicillin    Current Medications, Allergies, Past Medical History, Past Surgical History, Family History, and Social History were reviewed in [employer] record.   Review of Systems         See HPI - all other systems neg except as noted...  The patient complains of decreased hearing and dyspnea on exertion.  The patient denies anorexia, fever, weight loss, weight gain, vision loss, hoarseness, chest pain, syncope, peripheral edema, prolonged cough, headaches, hemoptysis, abdominal pain, melena, hematochezia, severe indigestion/heartburn, hematuria, incontinence, muscle weakness, suspicious skin lesions, transient blindness, difficulty walking, depression, unusual weight change, abnormal bleeding, enlarged lymph nodes, and angioedema.     Objective:   Physical Exam     WD, WN, 68 y/o BF in NAD... GENERAL:  Alert & oriented; pleasant & cooperative... HEENT:  [place]/AT, EOM-wnl, PERRLA, EACs-clear, TMs-wnl, NOSE-clear, THROAT-clear & wnl. NECK:  Supple w/ fairROM; no JVD; normal carotid impulses w/o bruits; no thyromegaly or nodules palpated; no lymphadenopathy. CHEST:  Clear to P & A; without wheezes/ rales/ or rhonchi. HEART:  Regular Rhythm; gr 1/6 SEM without rubs or gallops heard... ABDOMEN:  Obese, soft & nontender; + abd panniculus, normal bowel sounds; no organomegaly or masses detected. EXT: without deformities, mild arthritic changes; no varicose veins/ +venous insuffic/ no edema. NEURO:  CN's intact; no focal neuro deficits... DERM:  No lesions noted; no rash etc...  RADIOLOGY DATA:  Reviewed in the EPIC EMR & discussed w/ the patient...    >>CT Abd & Pelvis is pending...  LABORATORY DATA:  Reviewed in the EPIC EMR & discussed w/ the patient...   Assessment & Plan:    HBP>  Controlled on Norvasc, Lisinopril, Lasix; continue same + diet,  exercise, wt reduction...  Ven Insuffi, Hx DVT>  On ASA, advised no salt, elevation, support hose...  CHOL>  Well controlled on Crestor 57m/d, continue same...  DM>  Followed by DrAltheimer & well controlled on Metformin & Byetta...  HYPOTHYROID>  Well controlled on Synthroid 861m/d...  GI> GERD, IBS, Polyps>  Stable & up to date on screening...  GU> followed by DrMacDiarmid for recurrent UTIs, sm angiomyolipoma left kidney> stable...  DJD, CSpine spondy,  LBP>  Followed by Joie Bimler et al w/ epidural steroid shots in May12 & Jun12- improved she says...  LBP, Paresthesia, leg symptoms>  Followed by Dalphine Handing for Neuro & DrCabbell for NS...  Hyperuricemia> prev on Allopurinol daily w/ good control of Uric level on testing per DrAltheimer; she decided to stop this med, DrA still follows.  Vit D Deficiency>  On 50K weekly per DrAltheimer who monitors her levels...  B12 Deficiency>  On B12 shots monthly by city nurse & B12 level followed by DrAltheimer...  Anxiety>  Stable on Alprazolam, and she requests refill Rx...   Patient's Medications  New Prescriptions   TIZANIDINE (ZANAFLEX) 4 MG TABLET    Take 1 tablet (4 mg total) by mouth 3 (three) times daily.  Previous Medications   ACCU-CHEK AVIVA PLUS TEST STRIP       ASPIRIN 81 MG EC TABLET    Take 81 mg by mouth daily.     BLOOD GLUCOSE MONITORING SUPPL (ACCU-CHEK AVIVA PLUS) W/DEVICE KIT       ERGOCALCIFEROL (VITAMIN D2) 50000 UNITS CAPSULE    Take 50,000 Units by mouth once a week.     EXENATIDE (BYETTA 10 MCG PEN) 10 MCG/0.04ML SOLN    Inject 10 mcg into the skin 2 (two) times daily with a meal.     LEVOTHYROXINE (SYNTHROID, LEVOTHROID) 88 MCG TABLET    Take 88 mcg by mouth daily.     ROSUVASTATIN (CRESTOR) 20 MG TABLET    Take 1/2 daily    VITAMIN E 100 UNIT CAPSULE    Take 400 Units by mouth daily.  Modified Medications   Modified Medication Previous Medication   ALPRAZOLAM (XANAX) 0.5 MG TABLET ALPRAZolam (XANAX) 0.5 MG tablet       TAKE ONE TABLET BY MOUTH THREE TIMES DAILY AS NEEDED    TAKE ONE TABLET BY MOUTH THREE TIMES DAILY AS NEEDED   AMLODIPINE (NORVASC) 5 MG TABLET amLODipine (NORVASC) 5 MG tablet      TAKE ONE TABLET BY MOUTH EVERY DAY    TAKE ONE TABLET BY MOUTH EVERY DAY   CYANOCOBALAMIN (,VITAMIN B-12,) 1000 MCG/ML INJECTION cyanocobalamin (,VITAMIN B-12,) 1000 MCG/ML injection      1000 mcg subq once a month    1000 mcg subq once a month   FUROSEMIDE (LASIX) 20 MG TABLET furosemide (LASIX) 20 MG tablet      Take 1 tablet (20 mg total) by mouth daily.    Take 1 tablet (20 mg total) by mouth daily.   LISINOPRIL (PRINIVIL,ZESTRIL) 10 MG TABLET lisinopril (PRINIVIL,ZESTRIL) 10 MG tablet      TAKE ONE TABLET BY MOUTH EVERY DAY    TAKE ONE TABLET BY MOUTH EVERY DAY   METHOCARBAMOL (ROBAXIN) 500 MG TABLET methocarbamol (ROBAXIN) 500 MG tablet      Take 500 mg by mouth 3 (three) times daily as needed.     Take 1 tablet (500 mg total) by mouth 3 (three) times daily.   TRAMADOL (ULTRAM) 50 MG TABLET traMADol (ULTRAM) 50 MG tablet      Take 1 tablet (50 mg total) by mouth every 8 (eight) hours as needed.    Take 1 tablet (50 mg total) by mouth every 8 (eight) hours as needed.  Discontinued Medications   CYANOCOBALAMIN (,VITAMIN B-12,) 1000 MCG/ML INJECTION    USE AS DIRECTED

## 2013-07-28 LAB — HM PAP SMEAR

## 2013-08-19 ENCOUNTER — Ambulatory Visit (AMBULATORY_SURGERY_CENTER): Payer: Self-pay

## 2013-08-19 VITALS — Ht 65.5 in | Wt 169.2 lb

## 2013-08-19 DIAGNOSIS — Z86010 Personal history of colonic polyps: Secondary | ICD-10-CM

## 2013-08-19 MED ORDER — PREPOPIK 10-3.5-12 MG-GM-GM PO PACK: 1.0000 | PACK | ORAL | Status: DC

## 2013-08-25 ENCOUNTER — Encounter: Payer: Self-pay | Admitting: Gastroenterology

## 2013-09-02 ENCOUNTER — Encounter: Payer: Self-pay | Admitting: Gastroenterology

## 2013-09-02 ENCOUNTER — Ambulatory Visit (AMBULATORY_SURGERY_CENTER): Payer: Medicare Other | Admitting: Gastroenterology

## 2013-09-02 VITALS — BP 123/61 | HR 67 | Temp 96.6°F | Resp 21 | Ht 65.5 in | Wt 169.0 lb

## 2013-09-02 DIAGNOSIS — Z86010 Personal history of colonic polyps: Secondary | ICD-10-CM

## 2013-09-02 DIAGNOSIS — D126 Benign neoplasm of colon, unspecified: Secondary | ICD-10-CM

## 2013-09-02 LAB — GLUCOSE, CAPILLARY
Glucose-Capillary: 96 mg/dL (ref 70–99)
Glucose-Capillary: 109 mg/dL — ABNORMAL HIGH (ref 70–99)

## 2013-09-02 MED ORDER — SODIUM CHLORIDE 0.9 % IV SOLN: 500.0000 mL | INTRAVENOUS | Status: DC

## 2013-09-02 NOTE — Patient Instructions (Signed)
YOU HAD AN ENDOSCOPIC PROCEDURE TODAY AT THE [facility]: Refer to the procedure report that was given to you for any specific questions about what was found during the examination.  If the procedure report does not answer your questions, please call your gastroenterologist to clarify.  If you requested that your care partner not be given the details of your procedure findings, then the procedure report has been included in a sealed envelope for you to review at your convenience later.  YOU SHOULD EXPECT: Some feelings of bloating in the abdomen. Passage of more gas than usual.  Walking can help get rid of the air that was put into your GI tract during the procedure and reduce the bloating. If you had a lower endoscopy (such as a colonoscopy or flexible sigmoidoscopy) you may notice spotting of blood in your stool or on the toilet paper. If you underwent a bowel prep for your procedure, then you may not have a normal bowel movement for a few days.  DIET: Your first meal following the procedure should be a light meal and then it is ok to progress to your normal diet.  A half-sandwich or bowl of soup is an example of a good first meal.  Heavy or fried foods are harder to digest and may make you feel nauseous or bloated.  Likewise meals heavy in dairy and vegetables can cause extra gas to form and this can also increase the bloating.  Drink plenty of fluids but you should avoid alcoholic beverages for 24 hours.  ACTIVITY: Your care partner should take you home directly after the procedure.  You should plan to take it easy, moving slowly for the rest of the day.  You can resume normal activity the day after the procedure however you should NOT DRIVE or use heavy machinery for 24 hours (because of the sedation medicines used during the test).    SYMPTOMS TO REPORT IMMEDIATELY: A gastroenterologist can be reached at any hour.  During normal business hours, 8:30 AM to 5:00 PM Monday through Friday,  call [phone].  After hours and on weekends, please call the GI answering service at [phone] who will take a message and have the physician on call contact you.   Following lower endoscopy (colonoscopy or flexible sigmoidoscopy):  Excessive amounts of blood in the stool  Significant tenderness or worsening of abdominal pains  Swelling of the abdomen that is new, acute  Fever of 100F or higher    FOLLOW UP: If any biopsies were taken you will be contacted by phone or by letter within the next 1-3 weeks.  Call your gastroenterologist if you have not heard about the biopsies in 3 weeks.  Our staff will call the home number listed on your records the next business day following your procedure to check on you and address any questions or concerns that you may have at that time regarding the information given to you following your procedure. This is a courtesy call and so if there is no answer at the home number and we have not heard from you through the emergency physician on call, we will assume that you have returned to your regular daily activities without incident.  SIGNATURES/CONFIDENTIALITY: You and/or your care partner have signed paperwork which will be entered into your electronic medical record.  These signatures attest to the fact that that the information above on your After Visit Summary has been reviewed and is understood.  Full responsibility of the confidentiality   of this discharge information lies with you and/or your care-partner.    Information on polyps and hemorrhoids given to you today

## 2013-09-02 NOTE — Op Note (Signed)
[address]  [address] [place], [address]   COLONOSCOPY PROCEDURE REPORT  PATIENT: Katie [name], Katie [name]  MR#: [id number] BIRTHDATE: 08-23-1945 , 67  yrs. old GENDER: Female ENDOSCOPIST: Ladene Artist, MD, [facility] PROCEDURE DATE:  09/02/2013 PROCEDURE:   Colonoscopy with snare polypectomy First Screening Colonoscopy - Avg.  risk and is 50 yrs.  old or older - No.  Prior Negative Screening - Now for repeat screening. N/A  History of Adenoma - Now for follow-up colonoscopy & has been > or = to 3 yrs.  Yes hx of adenoma.  Has been 3 or more years since last colonoscopy.  Polyps Removed Today? Yes. ASA CLASS:   Class II INDICATIONS:Patient's personal history of adenomatous colon polyps.  MEDICATIONS: MAC sedation, administered by CRNA and propofol (Diprivan) 150mg  IV DESCRIPTION OF PROCEDURE:   After the risks benefits and alternatives of the procedure were thoroughly explained, informed consent was obtained.  A digital rectal exam revealed no abnormalities of the rectum.   The LB [id number]  endoscope was introduced through the anus and advanced to the cecum, which was identified by both the appendix and ileocecal valve. No adverse events experienced.   The quality of the prep was Prepopik good The instrument was then slowly withdrawn as the colon was fully examined.  COLON FINDINGS: A sessile polyp measuring 6 mm in size was found in the sigmoid colon.  A polypectomy was performed with a cold snare. The resection was complete and the polyp tissue was completely retrieved.  The colon was otherwise normal. There was no diverticulosis, inflammation, polyps or cancers unless previously stated.  Retroflexed views revealed moderate internal hemorrhoids. The time to cecum=2 minutes 56 seconds. Withdrawal time=8 minutes 58 seconds. The scope was withdrawn and the procedure completed. COMPLICATIONS: There were no complications.  ENDOSCOPIC IMPRESSION: 1.   Sessile polyp  measuring 6 mm in the sigmoid colon; polypectomy performed with a cold snare 2.   Moderate internal hemorrhoids  RECOMMENDATIONS: 1.  Await pathology results 2.  Repeat Colonscopy in 5 years.  eSigned:  Ladene Artist, MD, [facility] 09/02/2013 10:34 AM

## 2013-09-02 NOTE — Progress Notes (Signed)
Called to room to assist during endoscopic procedure.  Patient ID and intended procedure confirmed with present staff. Received instructions for my participation in the procedure from the performing physician.  

## 2013-09-03 ENCOUNTER — Telehealth: Payer: Self-pay | Admitting: *Deleted

## 2013-09-03 NOTE — Telephone Encounter (Signed)
  Follow up Call-  Call back number 09/02/2013  Post procedure Call Back phone  # [phone]  Permission to leave phone message Yes     Patient questions:  Do you have a fever, pain , or abdominal swelling? no Pain Score  0 *  Have you tolerated food without any problems? yes  Have you been able to return to your normal activities? yes  Do you have any questions about your discharge instructions: Diet   no Medications  no Follow up visit  no  Do you have questions or concerns about your Care? no  Actions: * If pain score is 4 or above: No action needed, pain <4.

## 2013-09-04 LAB — TSH: TSH: 0.48 u[IU]/mL (ref 0.41–5.90)

## 2013-09-04 LAB — HEMOGLOBIN A1C: Hgb A1c MFr Bld: 5.9 % (ref 4.0–6.0)

## 2013-09-04 LAB — LIPID PANEL
Cholesterol: 112 mg/dL (ref 0–200)
HDL: 67 mg/dL (ref 35–70)
LDL Cholesterol: 37 mg/dL
Triglycerides: 42 mg/dL (ref 40–160)

## 2013-09-10 ENCOUNTER — Encounter: Payer: Self-pay | Admitting: Gastroenterology

## 2013-10-15 ENCOUNTER — Telehealth: Payer: Self-pay | Admitting: Pulmonary Disease

## 2013-10-16 NOTE — Telephone Encounter (Signed)
PA form has been completed and faxed back to [facility].

## 2013-10-16 NOTE — Telephone Encounter (Signed)
Called [facility] and they will be faxing the form over to initiate the PA for the methocarbamol for the pt.

## 2013-10-16 NOTE — Telephone Encounter (Signed)
Pt methocarbamol needs PA.  Please advise if you want [name] to start PA. She see's new PCP 02/10/14 Please advise thanks

## 2013-10-23 NOTE — Telephone Encounter (Signed)
Denial received from [facility]  For the methocarbamol.  Will discuss with SN once he comes back on Monday.

## 2013-10-28 MED ORDER — TIZANIDINE HCL 4 MG PO TABS: 4.0000 mg | ORAL_TABLET | Freq: Three times a day (TID) | ORAL | Status: DC

## 2013-10-28 NOTE — Telephone Encounter (Signed)
Called pt. Aware of recs. rx sent in. Nothing further needed

## 2013-10-28 NOTE — Telephone Encounter (Signed)
Pt returning call says that she cn b reached @ [phone].Katie [name]

## 2013-10-28 NOTE — Telephone Encounter (Signed)
Per SN---  We can change the methocarbamol to tizanidine 4 mg  1 po  TID.  i have called and lmomtcb for the pt to see if she would like to change medications.

## 2013-11-05 ENCOUNTER — Other Ambulatory Visit: Payer: Self-pay | Admitting: Pulmonary Disease

## 2013-11-06 ENCOUNTER — Other Ambulatory Visit: Payer: Self-pay | Admitting: *Deleted

## 2013-11-06 MED ORDER — CYANOCOBALAMIN 1000 MCG/ML IJ SOLN: INTRAMUSCULAR | Status: DC

## 2013-11-10 ENCOUNTER — Telehealth: Payer: Self-pay | Admitting: *Deleted

## 2013-11-10 NOTE — Telephone Encounter (Signed)
Spoke with pt and she stated that she picked up her b12 bottle and thought that SN was going to write for the larger bottle that would last her for a while.  She stated that she spoke with her insurance company and they told her to have the office call  [phone] to get the larger bottle authorized.  Pt is aware that this will be done tomorrow.

## 2013-11-17 MED ORDER — CYANOCOBALAMIN 1000 MCG/ML IJ SOLN: INTRAMUSCULAR | Status: DC

## 2013-11-17 NOTE — Telephone Encounter (Signed)
Called the insurance company per the pts request---ID #  [id number]  This is not covered under the pts insurance.  They will send out a denial letter to [name] and we can do an appeal for the pt.  Pt is aware.

## 2013-11-19 NOTE — Telephone Encounter (Signed)
Denial letter/packet received and given to Leigh.

## 2013-11-20 NOTE — Telephone Encounter (Signed)
Letter given to SN to review.

## 2013-11-25 NOTE — Telephone Encounter (Signed)
Spoke with pt and advised that PA for B12 was denied and she would need to get rx for this from Dr Altheimer.  Pt verbalized understanding.

## 2013-11-25 NOTE — Telephone Encounter (Signed)
We did the PA for the b12 injections for her and this has been denied.  Per SN--  Pt has been on b12 shots for years and was previously given to her by the city nurse at work.  Labs per Dr. Twana First the pt know that she will have to get this rx from him.  thanks

## 2014-01-21 ENCOUNTER — Ambulatory Visit: Payer: Medicare Other | Admitting: Physician Assistant

## 2014-02-10 ENCOUNTER — Ambulatory Visit (INDEPENDENT_AMBULATORY_CARE_PROVIDER_SITE_OTHER): Payer: Medicare Other | Admitting: Internal Medicine

## 2014-02-10 ENCOUNTER — Encounter: Payer: Self-pay | Admitting: Internal Medicine

## 2014-02-10 VITALS — BP 118/74 | HR 69 | Temp 98.0°F | Ht 65.5 in | Wt 168.5 lb

## 2014-02-10 DIAGNOSIS — E7800 Pure hypercholesterolemia, unspecified: Secondary | ICD-10-CM

## 2014-02-10 DIAGNOSIS — Z0000 Encounter for general adult medical examination without abnormal findings: Secondary | ICD-10-CM

## 2014-02-10 DIAGNOSIS — I10 Essential (primary) hypertension: Secondary | ICD-10-CM

## 2014-02-10 DIAGNOSIS — F411 Generalized anxiety disorder: Secondary | ICD-10-CM

## 2014-02-10 DIAGNOSIS — E1149 Type 2 diabetes mellitus with other diabetic neurological complication: Secondary | ICD-10-CM

## 2014-02-10 MED ORDER — FUROSEMIDE 20 MG PO TABS: 20.0000 mg | ORAL_TABLET | Freq: Every day | ORAL | Status: DC

## 2014-02-10 MED ORDER — AMLODIPINE BESYLATE 5 MG PO TABS: ORAL_TABLET | ORAL | Status: DC

## 2014-02-10 MED ORDER — ALPRAZOLAM 0.5 MG PO TABS: 0.5000 mg | ORAL_TABLET | Freq: Three times a day (TID) | ORAL | Status: DC | PRN

## 2014-02-10 MED ORDER — LISINOPRIL 10 MG PO TABS: ORAL_TABLET | ORAL | Status: DC

## 2014-02-10 NOTE — Assessment & Plan Note (Signed)
Follows with endo for same Home cbgs well controlled Reviewed last labs The current medical regimen is effective;  continue present plan and medications. And continue work on weight reduction at gym (prev >200#) Lab Results  Component Value Date   HGBA1C 5.9 09/04/2013

## 2014-02-10 NOTE — Assessment & Plan Note (Signed)
BP Readings from Last 3 Encounters:  02/10/14 118/74  09/02/13 123/61  07/16/13 124/76   The current medical regimen is effective;  continue present plan and medications. Refills today

## 2014-02-10 NOTE — Progress Notes (Signed)
Pre visit review using our clinic review tool, if applicable. No additional management support is needed unless otherwise documented below in the visit note. 

## 2014-02-10 NOTE — Assessment & Plan Note (Signed)
Situational, reviewed Uses chronic BZ qhs and prn Refill today - support provided

## 2014-02-10 NOTE — Progress Notes (Signed)
Subjective:    Patient ID: Katie [name], female    DOB: [birth year], 68 y.o.   MRN: [id number]  HPI  New patient to me - transfer from [facility] for new PCP   Here for medicare wellness  Diet: heart healthy and diabetic Physical activity: gyn 4-5x/wk Depression/mood screen: negative Hearing: intact to whispered voice Visual acuity: grossly normal, performs annual eye exam  ADLs: capable Fall risk: none Home safety: good Cognitive evaluation: intact to orientation, naming, recall and repetition EOL planning: adv directives, full code/ I agree  I have personally reviewed and have noted 1. The patient's medical and social history 2. Their use of alcohol, tobacco or illicit drugs 3. Their current medications and supplements 4. The patient's functional ability including ADL's, fall risks, home safety risks and hearing or visual impairment. 5. Diet and physical activities 6. Evidence for depression or mood disorders  Also reviewed chronic medical issues and interval medical events: DM, thyroid, lipids, HTN, anxiety (spouse with cirrhosis and continued EtOH)  Past Medical History  Diagnosis Date  . Unspecified essential hypertension   . Acute venous embolism and thrombosis of unspecified deep vessels of lower extremity 2001    RLE DVT and PE, coumadin x 41mo, now ASSA  . Unspecified venous (peripheral) insufficiency   . Pure hypercholesterolemia   . Type II or unspecified type diabetes mellitus without mention of complication, not stated as uncontrolled   . Unspecified hypothyroidism   . Reflux esophagitis   . Irritable bowel syndrome   . Personal history of colonic polyps   . Iron deficiency anemia secondary to blood loss (chronic)   . Other B-complex deficiencies   . Degeneration of cervical intervertebral disc   . Lumbago   . Unspecified vitamin D deficiency   . Anxiety state, unspecified   . Unspecified iridocyclitis    Family History  Problem Relation Age of Onset  .  Prostate cancer Father   . Diabetes Mother     retinopathy/blind   History  Substance Use Topics  . Smoking status: Never Smoker   . Smokeless tobacco: Never Used  . Alcohol Use: No    Review of Systems  Constitutional: Negative for fatigue and unexpected weight change.  Respiratory: Negative for cough, shortness of breath and wheezing.   Cardiovascular: Negative for chest pain, palpitations and leg swelling.  Gastrointestinal: Negative for nausea, abdominal pain and diarrhea.  Neurological: Negative for dizziness, weakness, light-headedness and headaches.  Psychiatric/Behavioral: Positive for sleep disturbance (due to "worry" about spouse and his health, but denies SI/HI) and dysphoric mood. Negative for suicidal ideas, hallucinations, behavioral problems, confusion, self-injury, decreased concentration and agitation. The patient is not nervous/anxious and is not hyperactive.   All other systems reviewed and are negative.      Objective:   Physical Exam  BP 118/74  Pulse 69  Temp(Src) 98 F (36.7 C) (Oral)  Ht 5' 5.5" (1.664 m)  Wt 168 lb 8 oz (76.431 kg)  BMI 27.60 kg/m2  SpO2 97% Wt Readings from Last 3 Encounters:  02/10/14 168 lb 8 oz (76.431 kg)  09/02/13 169 lb (76.658 kg)  08/19/13 169 lb 3.2 oz (76.749 kg)   Constitutional: She appears well-developed and well-nourished. No distress.  HENT: Head: Normocephalic and atraumatic. Ears: B TMs ok, no erythema or effusion; Nose: Nose normal. Mouth/Throat: Oropharynx is clear and moist. No oropharyngeal exudate.  Eyes: Conjunctivae and EOM are normal. Pupils are equal, round, and reactive to light. No scleral icterus.  Neck: Normal range  of motion. Neck supple. No JVD present. No thyromegaly present.  Cardiovascular: Normal rate, regular rhythm and normal heart sounds.  No murmur heard. No BLE edema. Pulmonary/Chest: Effort normal and breath sounds normal. No respiratory distress. She has no wheezes.  Abdominal: Soft.  Bowel sounds are normal. She exhibits no distension. There is no tenderness. no masses GU/breast: defer to gyn Musculoskeletal: Normal range of motion, no joint effusions. No gross deformities Neurological: She is alert and oriented to person, place, and time. No cranial nerve deficit. Coordination, balance, strength, speech and gait are normal.  Skin: Skin is warm and dry. No rash noted. No erythema.  Psychiatric: She has a normal mood and affect. Her behavior is normal. Judgment and thought content normal.     Lab Results  Component Value Date   WBC 5.4 07/04/2007   HGB 14.5 07/04/2007   HCT 43.3 07/04/2007   PLT 252 07/04/2007   GLUCOSE 118* 05/31/2011   CHOL 112 09/04/2013   TRIG 42 09/04/2013   HDL 67 09/04/2013   LDLDIRECT 105.9 07/04/2007   LDLCALC 37 09/04/2013   ALT 32 07/04/2007   AST 32 07/04/2007   NA 143 05/31/2011   K 5.5* 05/31/2011   CL 106 05/31/2011   CREATININE 1.1 05/31/2011   BUN 13 05/31/2011   CO2 31 05/31/2011   TSH 0.48 09/04/2013   HGBA1C 5.9 09/04/2013    Mr Lumbar Spine Wo Contrast  03/15/2013   CLINICAL DATA:  Low back pain. Bilateral leg pain. History of previous fall.  EXAM: MRI LUMBAR SPINE WITHOUT CONTRAST  TECHNIQUE: Multiplanar, multisequence MR imaging was performed. No intravenous contrast was administered.  COMPARISON:  04/16/2012.  FINDINGS: Unchanged and healed L2 compression deformity without retropulsion. No worrisome osseous lesions. 4 mm anterolisthesis L4-5 is facet mediated, and has slightly progressed since most recent prior study, measuring 2 mm at that time. Slight widening left L4-5 facet joint, also reflecting increased anterolisthesis (image 11 series 5). Consider standing flexion extension films to evaluate for dynamic instability. Advanced disc space narrowing L5-S1. No paravertebral masses.  At L1-2, the disc bile bulges mildly. There is no impingement.  At L2-L3, the disc bulges mildly. There is also mild facet arthropathy, but no impingement.  At  L3-L4, the disc bulges mildly. There is mild facet and ligamentum flavum hypertrophy without neural impingement.  At L4-5 there is moderate central canal stenosis multifactorial in nature. 4 mm of facet mediated slip is accompanied by a central protrusion as well as facet and ligamentum flavum hypertrophy. Severe right-sided facet arthritis is re- demonstrated with bone marrow edema. Bilateral L5 and L4 nerve root impingement is observed, slightly worse on the right.  At L5-S1, there is 2 mm facet mediated anterolisthesis. The disc space is markedly narrowed. There is a central protrusion with slight caudal down turning. Mild to moderate facet arthritis is worse on the right. Slight mass effect right S1 nerve root. Moderate bilateral foraminal narrowing could affect either L5 nerve root.  Compared with December 2013, there appears to be slight worsening at L4-5.  IMPRESSION: Multilevel spondylosis as described, most notable at L4-5, right greater than left. See discussion above.  Slight progression of anterolisthesis at L4-5 when compared with December 2013. Consider standing flexion extension lumbar radiographs to evaluate for dynamic instability.  Healed compression deformity L2.   Electronically Signed   By: Rolla Flatten M.D.   On: 03/15/2013 16:10       Assessment & Plan:   CPX/AWV/v70.0 - Today patient  counseled on age appropriate routine health concerns for screening and prevention, each reviewed and up to date or declined. Immunizations reviewed and up to date or declined. Labs reviewed. Risk factors for depression reviewed and negative. Hearing function and visual acuity are intact. ADLs screened and addressed as needed. Functional ability and level of safety reviewed and appropriate. Education, counseling and referrals performed based on assessed risks today. Patient provided with a copy of personalized plan for preventive services.  Problem List Items Addressed This Visit   ANXIETY      Situational, reviewed Uses chronic BZ qhs and prn Refill today - support provided    Relevant Medications      ALPRAZolam (XANAX) tablet   ESSENTIAL HYPERTENSION      BP Readings from Last 3 Encounters:  02/10/14 118/74  09/02/13 123/61  07/16/13 124/76   The current medical regimen is effective;  continue present plan and medications. Refills today    Relevant Medications      amLODIpine (NORVASC) tablet      furosemide (LASIX) tablet      lisinopril (PRINIVIL,ZESTRIL) tablet   HYPERCHOLESTEROLEMIA     On statin - last lipds at endo reviewed and abstracted The current medical regimen is effective;  continue present plan and medications.     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 And continue work on weight reduction at gym (prev >200#) Lab Results  Component Value Date   HGBA1C 5.9 09/04/2013       Relevant Medications      lisinopril (PRINIVIL,ZESTRIL) tablet    Other Visit Diagnoses   Routine general medical examination at a health care facility    -  Primary

## 2014-02-10 NOTE — Assessment & Plan Note (Signed)
On statin - last lipds at endo reviewed and abstracted The current medical regimen is effective;  continue present plan and medications.

## 2014-02-10 NOTE — Patient Instructions (Addendum)
It was good to see you today.  We have reviewed your prior records including labs and tests today  Health Maintenance reviewed - all recommended immunizations and age-appropriate screenings are up-to-date.  Medications reviewed and updated, no changes recommended at this time. Refill on medication(s) as discussed today.  Please schedule followup in 6 months for semiannual exam and labs, call sooner if problems.  Health Maintenance Adopting a healthy lifestyle and getting preventive care can go a long way to promote health and wellness. Talk with your health care provider about what schedule of regular examinations is right for you. This is a good chance for you to check in with your provider about disease prevention and staying healthy. In between checkups, there are plenty of things you can do on your own. Experts have done a lot of research about which lifestyle changes and preventive measures are most likely to keep you healthy. Ask your health care provider for more information. WEIGHT AND DIET  Eat a healthy diet  Be sure to include plenty of vegetables, fruits, low-fat dairy products, and lean protein.  Do not eat a lot of foods high in solid fats, added sugars, or salt.  Get regular exercise. This is one of the most important things you can do for your health.  Most adults should exercise for at least 150 minutes each week. The exercise should increase your heart rate and make you sweat (moderate-intensity exercise).  Most adults should also do strengthening exercises at least twice a week. This is in addition to the moderate-intensity exercise.  Maintain a healthy weight  Body mass index (BMI) is a measurement that can be used to identify possible weight problems. It estimates body fat based on height and weight. Your health care provider can help determine your BMI and help you achieve or maintain a healthy weight.  For females 61 years of age and older:   A BMI below 18.5  is considered underweight.  A BMI of 18.5 to 24.9 is normal.  A BMI of 25 to 29.9 is considered overweight.  A BMI of 30 and above is considered obese.  Watch levels of cholesterol and blood lipids  You should start having your blood tested for lipids and cholesterol at 69 years of age, then have this test every 5 years.  You may need to have your cholesterol levels checked more often if:  Your lipid or cholesterol levels are high.  You are older than 69 years of age.  You are at high risk for heart disease.  CANCER SCREENING   Lung Cancer  Lung cancer screening is recommended for adults 55-61 years old who are at high risk for lung cancer because of a history of smoking.  A yearly low-dose CT scan of the lungs is recommended for people who:  Currently smoke.  Have quit within the past 15 years.  Have at least a 30-pack-year history of smoking. A pack year is smoking an average of one pack of cigarettes a day for 1 year.  Yearly screening should continue until it has been 15 years since you quit.  Yearly screening should stop if you develop a health problem that would prevent you from having lung cancer treatment.  Breast Cancer  Practice breast self-awareness. This means understanding how your breasts normally appear and feel.  It also means doing regular breast self-exams. Let your health care provider know about any changes, no matter how small.  If you are in your 20s or 30s, you  should have a clinical breast exam (CBE) by a health care provider every 1-3 years as part of a regular health exam.  If you are 68 or older, have a CBE every year. Also consider having a breast X-ray (mammogram) every year.  If you have a family history of breast cancer, talk to your health care provider about genetic screening.  If you are at high risk for breast cancer, talk to your health care provider about having an MRI and a mammogram every year.  Breast cancer gene (BRCA)  assessment is recommended for women who have family members with BRCA-related cancers. BRCA-related cancers include:  Breast.  Ovarian.  Tubal.  Peritoneal cancers.  Results of the assessment will determine the need for genetic counseling and BRCA1 and BRCA2 testing. Cervical Cancer Routine pelvic examinations to screen for cervical cancer are no longer recommended for nonpregnant women who are considered low risk for cancer of the pelvic organs (ovaries, uterus, and vagina) and who do not have symptoms. A pelvic examination may be necessary if you have symptoms including those associated with pelvic infections. Ask your health care provider if a screening pelvic exam is right for you.   The Pap test is the screening test for cervical cancer for women who are considered at risk.  If you had a hysterectomy for a problem that was not cancer or a condition that could lead to cancer, then you no longer need Pap tests.  If you are older than 65 years, and you have had normal Pap tests for the past 10 years, you no longer need to have Pap tests.  If you have had past treatment for cervical cancer or a condition that could lead to cancer, you need Pap tests and screening for cancer for at least 20 years after your treatment.  If you no longer get a Pap test, assess your risk factors if they change (such as having a new sexual partner). This can affect whether you should start being screened again.  Some women have medical problems that increase their chance of getting cervical cancer. If this is the case for you, your health care provider may recommend more frequent screening and Pap tests.  The human papillomavirus (HPV) test is another test that may be used for cervical cancer screening. The HPV test looks for the virus that can cause cell changes in the cervix. The cells collected during the Pap test can be tested for HPV.  The HPV test can be used to screen women 61 years of age and older.  Getting tested for HPV can extend the interval between normal Pap tests from three to five years.  An HPV test also should be used to screen women of any age who have unclear Pap test results.  After 69 years of age, women should have HPV testing as often as Pap tests.  Colorectal Cancer  This type of cancer can be detected and often prevented.  Routine colorectal cancer screening usually begins at 69 years of age and continues through 69 years of age.  Your health care provider may recommend screening at an earlier age if you have risk factors for colon cancer.  Your health care provider may also recommend using home test kits to check for hidden blood in the stool.  A small camera at the end of a tube can be used to examine your colon directly (sigmoidoscopy or colonoscopy). This is done to check for the earliest forms of colorectal cancer.  Routine screening  usually begins at age 51.  Direct examination of the colon should be repeated every 5-10 years through 69 years of age. However, you may need to be screened more often if early forms of precancerous polyps or small growths are found. Skin Cancer  Check your skin from head to toe regularly.  Tell your health care provider about any new moles or changes in moles, especially if there is a change in a mole's shape or color.  Also tell your health care provider if you have a mole that is larger than the size of a pencil eraser.  Always use sunscreen. Apply sunscreen liberally and repeatedly throughout the day.  Protect yourself by wearing long sleeves, pants, a wide-brimmed hat, and sunglasses whenever you are outside. HEART DISEASE, DIABETES, AND HIGH BLOOD PRESSURE   Have your blood pressure checked at least every 1-2 years. High blood pressure causes heart disease and increases the risk of stroke.  If you are between 14 years and 16 years old, ask your health care provider if you should take aspirin to prevent  strokes.  Have regular diabetes screenings. This involves taking a blood sample to check your fasting blood sugar level.  If you are at a normal weight and have a low risk for diabetes, have this test once every three years after 69 years of age.  If you are overweight and have a high risk for diabetes, consider being tested at a younger age or more often. PREVENTING INFECTION  Hepatitis B  If you have a higher risk for hepatitis B, you should be screened for this virus. You are considered at high risk for hepatitis B if:  You were born in a country where hepatitis B is common. Ask your health care provider which countries are considered high risk.  Your parents were born in a high-risk country, and you have not been immunized against hepatitis B (hepatitis B vaccine).  You have HIV or AIDS.  You use needles to inject street drugs.  You live with someone who has hepatitis B.  You have had sex with someone who has hepatitis B.  You get hemodialysis treatment.  You take certain medicines for conditions, including cancer, organ transplantation, and autoimmune conditions. Hepatitis C  Blood testing is recommended for:  Everyone born from [birth year] through 1966.  Anyone with known risk factors for hepatitis C. Sexually transmitted infections (STIs)  You should be screened for sexually transmitted infections (STIs) including gonorrhea and chlamydia if:  You are sexually active and are younger than 69 years of age.  You are older than 69 years of age and your health care provider tells you that you are at risk for this type of infection.  Your sexual activity has changed since you were last screened and you are at an increased risk for chlamydia or gonorrhea. Ask your health care provider if you are at risk.  If you do not have HIV, but are at risk, it may be recommended that you take a prescription medicine daily to prevent HIV infection. This is called pre-exposure prophylaxis  (PrEP). You are considered at risk if:  You are sexually active and do not regularly use condoms or know the HIV status of your partner(s).  You take drugs by injection.  You are sexually active with a partner who has HIV. Talk with your health care provider about whether you are at high risk of being infected with HIV. If you choose to begin PrEP, you should first be tested  for HIV. You should then be tested every 3 months for as long as you are taking PrEP.  PREGNANCY   If you are premenopausal and you may become pregnant, ask your health care provider about preconception counseling.  If you may become pregnant, take 400 to 800 micrograms (mcg) of folic acid every day.  If you want to prevent pregnancy, talk to your health care provider about birth control (contraception). OSTEOPOROSIS AND MENOPAUSE   Osteoporosis is a disease in which the bones lose minerals and strength with aging. This can result in serious bone fractures. Your risk for osteoporosis can be identified using a bone density scan.  If you are 50 years of age or older, or if you are at risk for osteoporosis and fractures, ask your health care provider if you should be screened.  Ask your health care provider whether you should take a calcium or vitamin D supplement to lower your risk for osteoporosis.  Menopause may have certain physical symptoms and risks.  Hormone replacement therapy may reduce some of these symptoms and risks. Talk to your health care provider about whether hormone replacement therapy is right for you.  HOME CARE INSTRUCTIONS   Schedule regular health, dental, and eye exams.  Stay current with your immunizations.   Do not use any tobacco products including cigarettes, chewing tobacco, or electronic cigarettes.  If you are pregnant, do not drink alcohol.  If you are breastfeeding, limit how much and how often you drink alcohol.  Limit alcohol intake to no more than 1 drink per day for  nonpregnant women. One drink equals 12 ounces of beer, 5 ounces of wine, or 1 ounces of hard liquor.  Do not use street drugs.  Do not share needles.  Ask your health care provider for help if you need support or information about quitting drugs.  Tell your health care provider if you often feel depressed.  Tell your health care provider if you have ever been abused or do not feel safe at home. Document Released: 11/14/2010 Document Revised: 09/15/2013 Document Reviewed: 04/02/2013 [facility] Patient Information 2015 [place], [place]. This information is not intended to replace advice given to you by your health care provider. Make sure you discuss any questions you have with your health care provider.

## 2014-03-26 LAB — HM MAMMOGRAPHY

## 2014-04-03 ENCOUNTER — Encounter: Payer: Self-pay | Admitting: Internal Medicine

## 2014-05-21 LAB — LIPID PANEL
Cholesterol: 132 mg/dL (ref 0–200)
HDL: 83 mg/dL — AB (ref 35–70)
LDL Cholesterol: 36 mg/dL
Triglycerides: 64 mg/dL (ref 40–160)

## 2014-05-21 LAB — HEMOGLOBIN A1C: Hgb A1c MFr Bld: 6 % (ref 4.0–6.0)

## 2014-05-21 LAB — TSH: TSH: 0.88 u[IU]/mL (ref 0.41–5.90)

## 2014-05-29 LAB — HM DIABETES EYE EXAM

## 2014-07-14 ENCOUNTER — Encounter: Payer: Self-pay | Admitting: Internal Medicine

## 2014-07-14 ENCOUNTER — Ambulatory Visit (INDEPENDENT_AMBULATORY_CARE_PROVIDER_SITE_OTHER): Payer: Medicare Other | Admitting: Internal Medicine

## 2014-07-14 ENCOUNTER — Other Ambulatory Visit: Payer: Medicare Other

## 2014-07-14 VITALS — BP 110/78 | HR 87 | Temp 97.5°F | Resp 16 | Ht 66.0 in | Wt 173.2 lb

## 2014-07-14 DIAGNOSIS — S8991XA Unspecified injury of right lower leg, initial encounter: Secondary | ICD-10-CM

## 2014-07-14 DIAGNOSIS — Z86718 Personal history of other venous thrombosis and embolism: Secondary | ICD-10-CM

## 2014-07-14 DIAGNOSIS — I872 Venous insufficiency (chronic) (peripheral): Secondary | ICD-10-CM

## 2014-07-14 NOTE — Patient Instructions (Addendum)
Wear support garments when driving. Stop every 2 hours and walk for 5-10 minutes. During the travel take 325 mg of full dose aspirin after breakfast the days of travel.    Your next office appointment will be determined based upon review of your pending labs   Those instructions will be transmitted to you by mail. Critical values will be called. Followup as needed for any active or acute issue. Please report any significant change in your symptoms.

## 2014-07-14 NOTE — Progress Notes (Signed)
Pre visit review using our clinic review tool, if applicable. No additional management support is needed unless otherwise documented below in the visit note. 

## 2014-07-14 NOTE — Progress Notes (Signed)
   Subjective:    Patient ID: Katie [name], female    DOB: February 21, 1946, 69 y.o.   MRN: [id number]  HPI  She sustained an abrasion to the left inferior shin 2 weeks ago in a mechanical fall. She misstepped descending the last 2 stairs, sustaining the injury. There was no cardiac or neurologic prodrome prior to the fall. The leg was swollen but this has resolved with ice and witch hazel applications. She has had no associated cardiopulmonary symptoms with the swelling. Her concern is that she had deep venous thrombosis and pulmonary thromboemboli in 2008. She knows of no definite predisposition such as prolonged travel, surgery, or prolonged and activity for that event She has had a history of venous insufficiency since 2005. She's had no surgery on the legs.  Her concern is she plans to drive to [place] in the near future. She is on a baby aspirin everyday.  Her mother had deep venous she believes.   Review of Systems Denied were any change in heart rhythm or rate prior to the event. There was no associated chest pain or shortness of breath . Also specifically denied prior to the episode were headache, limb weakness, tingling, or numbness. No seizure activity noted. Chest pain, palpitations, tachycardia, exertional dyspnea, paroxysmal nocturnal dyspnea, claudication or edema are absent. She is being treated for dermatitis of the left medial ankle by the podiatrist.     Objective:   Physical Exam Pertinent or positive findings: Dense arcus senilis is present. Homans sign is negative bilaterally.  She has a small eschar over the right inferior shin area with no evidence of associated cellulitis or pustule formation. There is a tinea plaque at the left medial ankle.  Appears healthy and well-nourished & in no acute distress No carotid bruits are present.No neck vein distention present at 10 - 15 degrees. Thyroid normal to palpation Heart rhythm and rate are normal with no gallop or  murmur Chest is clear with no increased work of breathing There is no evidence of aortic aneurysm or renal artery bruits Abdomen soft with no organomegaly or masses. No HJR No clubbing, cyanosis or edema present. Pedal pulses are intact  No ischemic skin changes are present . Fingernails healthy  Alert and oriented. Strength, tone, DTRs reflexes normal         Assessment & Plan:  #1 status post injury right lower extremity in mechanical fall. Clinically there is no evidence of deep venous thrombosis  #2 past medical history of deep venous thrombosis/pulmonary emboli without definite predisposition.  Plan: During the travel she should stop every 2 hours and walk around. Full dose aspirin is recommended during that time. Support hose would be recommended during travel.

## 2014-07-15 ENCOUNTER — Ambulatory Visit (HOSPITAL_COMMUNITY): Payer: Medicare Other | Attending: Internal Medicine | Admitting: Cardiology

## 2014-07-15 ENCOUNTER — Other Ambulatory Visit: Payer: Self-pay | Admitting: Internal Medicine

## 2014-07-15 DIAGNOSIS — M7989 Other specified soft tissue disorders: Secondary | ICD-10-CM

## 2014-07-15 DIAGNOSIS — R791 Abnormal coagulation profile: Secondary | ICD-10-CM | POA: Insufficient documentation

## 2014-07-15 DIAGNOSIS — E119 Type 2 diabetes mellitus without complications: Secondary | ICD-10-CM | POA: Diagnosis not present

## 2014-07-15 DIAGNOSIS — R7989 Other specified abnormal findings of blood chemistry: Secondary | ICD-10-CM

## 2014-07-15 DIAGNOSIS — Z86718 Personal history of other venous thrombosis and embolism: Secondary | ICD-10-CM

## 2014-07-15 DIAGNOSIS — E785 Hyperlipidemia, unspecified: Secondary | ICD-10-CM | POA: Diagnosis not present

## 2014-07-15 DIAGNOSIS — M791 Myalgia: Secondary | ICD-10-CM

## 2014-07-15 DIAGNOSIS — I10 Essential (primary) hypertension: Secondary | ICD-10-CM | POA: Insufficient documentation

## 2014-07-15 LAB — D-DIMER, QUANTITATIVE: D-Dimer, Quant: 0.7 ug/mL-FEU — ABNORMAL HIGH (ref 0.00–0.48)

## 2014-07-15 NOTE — Progress Notes (Signed)
Pt scheduled for 3:30 this afternoon @ cardiology office. They will call the results to you. Pt is aware.

## 2014-07-15 NOTE — Progress Notes (Signed)
Bilateral lower extremity venous duplex performed   Results given to Dr.Leschber.

## 2014-07-20 ENCOUNTER — Other Ambulatory Visit: Payer: Medicare Other

## 2014-07-20 ENCOUNTER — Encounter: Payer: Self-pay | Admitting: Internal Medicine

## 2014-07-20 ENCOUNTER — Ambulatory Visit (INDEPENDENT_AMBULATORY_CARE_PROVIDER_SITE_OTHER): Payer: Medicare Other | Admitting: Internal Medicine

## 2014-07-20 VITALS — BP 106/80 | HR 89 | Temp 98.1°F | Resp 17 | Ht 66.0 in

## 2014-07-20 DIAGNOSIS — R791 Abnormal coagulation profile: Secondary | ICD-10-CM

## 2014-07-20 DIAGNOSIS — Z86718 Personal history of other venous thrombosis and embolism: Secondary | ICD-10-CM

## 2014-07-20 DIAGNOSIS — R7989 Other specified abnormal findings of blood chemistry: Secondary | ICD-10-CM

## 2014-07-20 NOTE — Progress Notes (Signed)
   Subjective:    Patient ID: Katie [name], female    DOB: [birth year], 69 y.o.   MRN: [id number]  HPI She's travels to [place] by car  beginning 07/23/14; she will return 07/27/14.  Past history was reviewed; the deep venous thrombosis occurred in 2008 without trigger or  injury. It was complicated by  pulmonary thromboembolus. She was on warfarin for 12 months. No hypercoagulable panel is present in the chart  She has never smoked and was not on birth control at that time.   D-dimer was done to assess the risk of recurrent deep venous thrombosis in the context of this prolonged driving. It was mildly elevated at 0.7. Venous Doppler revealed a venous "web"without definitive clot in the right popliteal vein.  She has no active cardio pulmonary symptoms.  Her mother did have deep venous thrombosis.         Review of Systems   Chest pain, palpitations, tachycardia, exertional dyspnea, paroxysmal nocturnal dyspnea, claudication or edema are absent.      Objective:   Physical Exam  Appears healthy and well-nourished & in no acute distress  No carotid bruits are present.No neck vein distention present at 10 - 15 degrees. Thyroid normal to palpation  Heart rhythm and rate are normal with no gallop or murmur  Chest is clear with no increased work of breathing  There is no evidence of aortic aneurysm or renal artery bruits  Abdomen soft with no organomegaly or masses. No HJR  No clubbing, cyanosis or edema present.  Pedal pulses are intact .Homan's negative  No ischemic skin changes are present . Fingernails healthy   Alert and oriented. Strength, tone, DTRs reflexes normal       Assessment & Plan:  See Current Assessment & Plan in Problem List under specific Diagnosis

## 2014-07-20 NOTE — Progress Notes (Signed)
Pre visit review using our clinic review tool, if applicable. No additional management support is needed unless otherwise documented below in the visit note. 

## 2014-07-20 NOTE — Assessment & Plan Note (Addendum)
Hypercoagulopathy Panel Xarelto coverage during travel [facility] evaluation

## 2014-07-20 NOTE — Patient Instructions (Signed)
  Please start Xarelto 15 mg twice a day beginning the evening before your car trip.Stop it after you are home. NO aspirin , Advil, Naproxen, etc ( Tylenol OK)   Xarelto 15 mg  # 14 ; Lot [id number]; Exp 10/17

## 2014-07-23 LAB — HYPERCOAGULABLE PANEL, COMPREHENSIVE
AntiThromb III Func: 116 % (ref 76–126)
Anticardiolipin IgA: 0 APL U/mL (ref <22)
Anticardiolipin IgG: 5 GPL U/mL (ref <23)
Anticardiolipin IgM: 4 MPL U/mL (ref <11)
Beta-2 Glyco I IgG: 12 G Units (ref <20)
Beta-2-Glycoprotein I IgA: 10 A Units (ref <20)
Beta-2-Glycoprotein I IgM: 10 M Units (ref <20)
DRVVT: 34.4 secs (ref <42.9)
Lupus Anticoagulant: NOT DETECTED
PTT Lupus Anticoagulant: 36.6 secs (ref 28.0–43.0)
Protein C Activity: 194 % — ABNORMAL HIGH (ref 75–133)
Protein C, Total: 124 % (ref 72–160)
Protein S Activity: 110 % (ref 69–129)
Protein S Total: 98 % (ref 60–150)

## 2014-08-06 ENCOUNTER — Encounter: Payer: Self-pay | Admitting: Surgery

## 2014-08-10 ENCOUNTER — Ambulatory Visit (INDEPENDENT_AMBULATORY_CARE_PROVIDER_SITE_OTHER): Payer: Medicare Other | Admitting: Surgery

## 2014-08-10 ENCOUNTER — Encounter: Payer: Self-pay | Admitting: Surgery

## 2014-08-10 VITALS — BP 115/78 | HR 104 | Ht 66.0 in | Wt 170.0 lb

## 2014-08-10 DIAGNOSIS — I82439 Acute embolism and thrombosis of unspecified popliteal vein: Secondary | ICD-10-CM | POA: Diagnosis not present

## 2014-08-10 NOTE — Progress Notes (Signed)
Patient name: Katie [name] MRN: [id number] DOB: 20-Jan-1946 Sex: female   Referred by: Dr. Linna Darner  Reason for referral:  Chief Complaint  Patient presents with  . Re-evaluation    venous insuff of R LE w/ Hx of DVT    HISTORY OF PRESENT ILLNESS: This is a 69 year old female who comes in today for further evaluation of her history of DVT.  The patient reports having had a DVT on the right leg with PE in 2008.  She was treated with Coumadin for one year which was then stopped and she started taking an aspirin.  She has had no hypercoagulable events since that time.  She recently took a long trip and took [place] and for compression stockings and did not have any complications.  She does report having some swelling in her right leg after a recent injury.  Otherwise, she has no significant swelling.  She does were compression stockings.  The patient suffers from hypertension which is medically managed with an ACE inhibitor.  She is on a statin for hypercholesterolemia.  She is a type II diabetic.  Her most recent hemoglobin A1c was 5.9.  She is a nonsmoker.  Past Medical History  Diagnosis Date  . Unspecified essential hypertension   . Acute venous embolism and thrombosis of unspecified deep vessels of lower extremity 2001    RLE DVT and PE, coumadin x 11mo now ASSA  . Unspecified venous (peripheral) insufficiency   . Pure hypercholesterolemia   . Type II or unspecified type diabetes mellitus without mention of complication, not stated as uncontrolled   . Unspecified hypothyroidism   . Reflux esophagitis   . Irritable bowel syndrome   . Personal history of colonic polyps   . Iron deficiency anemia secondary to blood loss (chronic)   . Other B-complex deficiencies   . Degeneration of cervical intervertebral disc   . Lumbago   . Unspecified vitamin D deficiency   . Anxiety state, unspecified   . Unspecified iridocyclitis   . History of DVT (deep vein thrombosis)     Past Surgical  History  Procedure Laterality Date  . Vesicovaginal fistula closure w/ tah    . Oophorectomy    . Femoral hernia repair    . Carpal tunnel release      History   Social History  . Marital Status: Married    Spouse Name: RKarson Chicas . Number of Children: 0  . Years of Education: N/A   Occupational History  . record specialist   .  Unemployed   Social History Main Topics  . Smoking status: Never Smoker   . Smokeless tobacco: Never Used  . Alcohol Use: 0.0 oz/week    0 Standard drinks or equivalent per week     Comment: occasional  . Drug Use: No  . Sexual Activity: Not on file   Other Topics Concern  . Not on file   Social History Narrative    Family History  Problem Relation Age of Onset  . Prostate cancer Father   . Diabetes Mother     retinopathy/blind  . Deep vein thrombosis Mother   . Hypertension Mother     Allergies as of 08/10/2014 - Review Complete 08/10/2014  Allergen Reaction Noted  . Metoprolol succinate    . Penicillins      Current Outpatient Prescriptions on File Prior to Visit  Medication Sig Dispense Refill  . ACCU-CHEK AVIVA PLUS test strip     . ALPRAZolam (Duanne Moron  0.5 MG tablet Take 1 tablet (0.5 mg total) by mouth 3 (three) times daily as needed for anxiety or sleep. 90 tablet 5  . amLODipine (NORVASC) 5 MG tablet TAKE ONE TABLET BY MOUTH EVERY DAY 90 tablet 1  . aspirin 81 MG EC tablet Take 81 mg by mouth daily.      . Blood Glucose Monitoring Suppl (ACCU-CHEK AVIVA PLUS) W/DEVICE KIT     . cyanocobalamin (,VITAMIN B-12,) 1000 MCG/ML injection 1000 mcg subq once a month 30 mL 1  . ergocalciferol (VITAMIN D2) 50000 UNITS capsule Take 50,000 Units by mouth once a week.      Marland Kitchen exenatide (BYETTA 10 MCG PEN) 10 MCG/0.04ML SOLN Inject 10 mcg into the skin 2 (two) times daily with a meal.      . furosemide (LASIX) 20 MG tablet Take 1 tablet (20 mg total) by mouth daily. 90 tablet 1  . levothyroxine (SYNTHROID, LEVOTHROID) 88 MCG tablet Take  88 mcg by mouth daily.      Marland Kitchen lisinopril (PRINIVIL,ZESTRIL) 10 MG tablet TAKE ONE TABLET BY MOUTH EVERY DAY 90 tablet 1  . rosuvastatin (CRESTOR) 20 MG tablet Take 1/2 daily     . tiZANidine (ZANAFLEX) 4 MG tablet Take 1 tablet (4 mg total) by mouth 3 (three) times daily. 90 tablet 0  . traMADol (ULTRAM) 50 MG tablet Take 1 tablet (50 mg total) by mouth every 8 (eight) hours as needed. 90 tablet 5  . vitamin E 100 UNIT capsule Take 400 Units by mouth daily.     No current facility-administered medications on file prior to visit.     REVIEW OF SYSTEMS: Cardiovascular: No chest pain, chest pressure, palpitations, orthopnea, or dyspnea on exertion. No claudication or rest pain, positive history for DVT. Pulmonary: No productive cough, asthma or wheezing. Neurologic: No weakness, paresthesias, aphasia, or amaurosis. No dizziness. Hematologic: No bleeding problems or clotting disorders. Musculoskeletal: No joint pain or joint swelling. Gastrointestinal: No blood in stool or hematemesis Genitourinary: No dysuria or hematuria. Psychiatric:: No history of major depression. Integumentary: No rashes or ulcers. Constitutional: No fever or chills.  PHYSICAL EXAMINATION:  Filed Vitals:   08/10/14 1058  BP: 115/78  Pulse: 104  Height: 5' 6"  (1.676 m)  Weight: 170 lb (77.111 kg)  SpO2: 100%   Body mass index is 27.45 kg/(m^2). General: The patient appears their stated age.   HEENT:  No gross abnormalities Pulmonary: Respirations are non-labored Musculoskeletal: There are no major deformities.   Neurologic: No focal weakness or paresthesias are detected, Skin: There are no ulcer or rashes noted. Psychiatric: The patient has normal affect. Cardiovascular:   Palpable pedal pulses bilaterally.  No edema  Diagnostic Studies: I have reviewed her outside ultrasound which shows a chronic weblike thrombus within the right proximal popliteal vein.  No other deep or superficial vein thrombus or  incompetence was noted bilaterally.   Assessment:  Remote history of right leg DVT and PE Plan: The ultrasound shows that the patient has chronic changes within the right popliteal vein, consistent with chronic DVT.  She does not have any significant swelling, however I did recommend wearing compression stockings.  There is no role for intervention on the weblike findings and her popliteal vein as these are chronic.  The etiology for her DVT in 2008 is unknown.  A hypercoagulable workup, is in the process.  Should this come positive strong consideration for anticoagulation would be made.  The patient can follow up with me on an as-needed  basis.   Eldridge Abrahams, M.D. [facility] Office: [phone] Pager:  [phone]

## 2014-08-11 ENCOUNTER — Encounter: Payer: Self-pay | Admitting: Internal Medicine

## 2014-08-11 ENCOUNTER — Ambulatory Visit (INDEPENDENT_AMBULATORY_CARE_PROVIDER_SITE_OTHER): Payer: Medicare Other | Admitting: Internal Medicine

## 2014-08-11 VITALS — BP 112/70 | HR 61 | Temp 97.5°F | Ht 66.0 in | Wt 169.0 lb

## 2014-08-11 DIAGNOSIS — E039 Hypothyroidism, unspecified: Secondary | ICD-10-CM

## 2014-08-11 DIAGNOSIS — Z86718 Personal history of other venous thrombosis and embolism: Secondary | ICD-10-CM | POA: Diagnosis not present

## 2014-08-11 DIAGNOSIS — Z23 Encounter for immunization: Secondary | ICD-10-CM

## 2014-08-11 DIAGNOSIS — E119 Type 2 diabetes mellitus without complications: Secondary | ICD-10-CM

## 2014-08-11 MED ORDER — LISINOPRIL 10 MG PO TABS: 10.0000 mg | ORAL_TABLET | Freq: Every day | ORAL | Status: DC

## 2014-08-11 MED ORDER — TRAMADOL HCL 50 MG PO TABS: 50.0000 mg | ORAL_TABLET | Freq: Three times a day (TID) | ORAL | Status: DC | PRN

## 2014-08-11 MED ORDER — ALPRAZOLAM 0.5 MG PO TABS: 0.5000 mg | ORAL_TABLET | Freq: Three times a day (TID) | ORAL | Status: DC | PRN

## 2014-08-11 MED ORDER — AMLODIPINE BESYLATE 5 MG PO TABS: ORAL_TABLET | ORAL | Status: DC

## 2014-08-11 MED ORDER — FUROSEMIDE 20 MG PO TABS: 20.0000 mg | ORAL_TABLET | Freq: Every day | ORAL | Status: DC

## 2014-08-11 NOTE — Patient Instructions (Signed)
It was good to see you today.  We have reviewed your prior records including labs and tests today  Prevnar 13 immunization updated today  Medications reviewed and updated, no changes recommended at this time. Refill on medication(s) as discussed today.  Please schedule followup in 6 months for annual exam, call sooner if problems.

## 2014-08-11 NOTE — Progress Notes (Signed)
Subjective:    Patient ID: Danalyn Gardocki, female    DOB: [birth year], 69 y.o.   MRN: [id number]  HPI  Patient here for follow-up. Reviewed chronic conditions, interval events and current concerns  Past Medical History  Diagnosis Date  . Unspecified essential hypertension   . Acute venous embolism and thrombosis of unspecified deep vessels of lower extremity 2001    RLE DVT and PE, coumadin x 72mo, now ASSA  . Unspecified venous (peripheral) insufficiency   . Pure hypercholesterolemia   . Type II or unspecified type diabetes mellitus without mention of complication, not stated as uncontrolled   . Unspecified hypothyroidism   . Reflux esophagitis   . Irritable bowel syndrome   . Personal history of colonic polyps   . Iron deficiency anemia secondary to blood loss (chronic)   . Other B-complex deficiencies   . Degeneration of cervical intervertebral disc   . Lumbago   . Unspecified vitamin D deficiency   . Anxiety state, unspecified   . Unspecified iridocyclitis   . History of DVT (deep vein thrombosis)     Review of Systems  Constitutional: Positive for fatigue. Negative for unexpected weight change.  Respiratory: Negative for cough and shortness of breath.   Cardiovascular: Negative for chest pain and leg swelling.       Objective:    Physical Exam  Constitutional: She appears well-developed and well-nourished. No distress.  Cardiovascular: Normal rate, regular rhythm and normal heart sounds.   No murmur heard. Pulmonary/Chest: Effort normal and breath sounds normal. No respiratory distress.  Musculoskeletal: She exhibits no edema.    BP 112/70 mmHg  Pulse 61  Temp(Src) 97.5 F (36.4 C) (Oral)  Ht 5\' 6"  (1.676 m)  Wt 169 lb (76.658 kg)  BMI 27.29 kg/m2  SpO2 97% Wt Readings from Last 3 Encounters:  08/11/14 169 lb (76.658 kg)  08/10/14 170 lb (77.111 kg)  07/14/14 173 lb 4 oz (78.586 kg)     Lab Results  Component Value Date   WBC 5.4 07/04/2007   HGB  14.5 07/04/2007   HCT 43.3 07/04/2007   PLT 252 07/04/2007   GLUCOSE 118* 05/31/2011   CHOL 132 05/21/2014   TRIG 64 05/21/2014   HDL 83* 05/21/2014   LDLDIRECT 105.9 07/04/2007   LDLCALC 36 05/21/2014   ALT 32 07/04/2007   AST 32 07/04/2007   NA 143 05/31/2011   K 5.5* 05/31/2011   CL 106 05/31/2011   CREATININE 1.1 05/31/2011   BUN 13 05/31/2011   CO2 31 05/31/2011   TSH 0.88 05/21/2014   HGBA1C 6.0 05/21/2014    Mr Lumbar Spine Wo Contrast  03/15/2013   CLINICAL DATA:  Low back pain. Bilateral leg pain. History of previous fall.  EXAM: MRI LUMBAR SPINE WITHOUT CONTRAST  TECHNIQUE: Multiplanar, multisequence MR imaging was performed. No intravenous contrast was administered.  COMPARISON:  04/16/2012.  FINDINGS: Unchanged and healed L2 compression deformity without retropulsion. No worrisome osseous lesions. 4 mm anterolisthesis L4-5 is facet mediated, and has slightly progressed since most recent prior study, measuring 2 mm at that time. Slight widening left L4-5 facet joint, also reflecting increased anterolisthesis (image 11 series 5). Consider standing flexion extension films to evaluate for dynamic instability. Advanced disc space narrowing L5-S1. No paravertebral masses.  At L1-2, the disc bile bulges mildly. There is no impingement.  At L2-L3, the disc bulges mildly. There is also mild facet arthropathy, but no impingement.  At L3-L4, the disc bulges mildly. There is mild  facet and ligamentum flavum hypertrophy without neural impingement.  At L4-5 there is moderate central canal stenosis multifactorial in nature. 4 mm of facet mediated slip is accompanied by a central protrusion as well as facet and ligamentum flavum hypertrophy. Severe right-sided facet arthritis is re- demonstrated with bone marrow edema. Bilateral L5 and L4 nerve root impingement is observed, slightly worse on the right.  At L5-S1, there is 2 mm facet mediated anterolisthesis. The disc space is markedly narrowed.  There is a central protrusion with slight caudal down turning. Mild to moderate facet arthritis is worse on the right. Slight mass effect right S1 nerve root. Moderate bilateral foraminal narrowing could affect either L5 nerve root.  Compared with December 2013, there appears to be slight worsening at L4-5.  IMPRESSION: Multilevel spondylosis as described, most notable at L4-5, right greater than left. See discussion above.  Slight progression of anterolisthesis at L4-5 when compared with December 2013. Consider standing flexion extension lumbar radiographs to evaluate for dynamic instability.  Healed compression deformity L2.   Electronically Signed   By: Rolla Flatten M.D.   On: 03/15/2013 16:10       Assessment & Plan:   Problem List Items Addressed This Visit    Diabetes mellitus type 2, controlled    Follows with endo for same Home cbgs well controlled Reviewed last labs The current medical regimen is effective;  continue present plan and medications. And continue work on weight reduction at gym (prev >200#) Lab Results  Component Value Date   HGBA1C 6.0 05/21/2014        Relevant Medications   lisinopril (PRINIVIL,ZESTRIL) tablet   History of DVT of lower extremity    History of same (injury induced) right lower extremity 2008 -treated with warfarin 12 months then discontinued Accidental injury with fall spring 2016. Weblike thrombus in right popliteal vein Hypercoagulopathy Panel negative 07/23/2014 Evaluation by vascular March 28 reviewed: No anticoagulation recommended, use compression stockings as needed Reviewed same recommendations with patient today. Support provided      Hypothyroidism    Follows with Endo for same. Last labs reviewed The current medical regimen is effective;  continue present plan and medications. Lab Results  Component Value Date   TSH 0.88 05/21/2014          Other Visit Diagnoses    Need for prophylactic vaccination against Streptococcus  pneumoniae (pneumococcus)    -  Primary    Relevant Orders    Pneumococcal conjugate vaccine 13-valent (Completed)        Gwendolyn Grant, MD

## 2014-08-11 NOTE — Assessment & Plan Note (Signed)
Follows with endo for same Home cbgs well controlled Reviewed last labs The current medical regimen is effective;  continue present plan and medications. And continue work on weight reduction at gym (prev >200#) Lab Results  Component Value Date   HGBA1C 6.0 05/21/2014

## 2014-08-11 NOTE — Assessment & Plan Note (Signed)
Follows with Endo for same. Last labs reviewed The current medical regimen is effective;  continue present plan and medications. Lab Results  Component Value Date   TSH 0.88 05/21/2014

## 2014-08-11 NOTE — Assessment & Plan Note (Signed)
History of same (injury induced) right lower extremity 2008 -treated with warfarin 12 months then discontinued Accidental injury with fall spring 2016. Weblike thrombus in right popliteal vein Hypercoagulopathy Panel negative 07/23/2014 Evaluation by vascular March 28 reviewed: No anticoagulation recommended, use compression stockings as needed Reviewed same recommendations with patient today. Support provided

## 2014-08-11 NOTE — Progress Notes (Signed)
Pre visit review using our clinic review tool, if applicable. No additional management support is needed unless otherwise documented below in the visit note. 

## 2014-09-14 ENCOUNTER — Encounter (HOSPITAL_COMMUNITY): Payer: Medicare Other

## 2014-09-14 ENCOUNTER — Encounter: Payer: Medicare Other | Admitting: Surgery

## 2015-01-18 LAB — HEMOGLOBIN A1C: Hgb A1c MFr Bld: 6.2 % — AB (ref 4.0–6.0)

## 2015-01-18 LAB — LIPID PANEL
Cholesterol: 119 mg/dL (ref 0–200)
HDL: 91 mg/dL — AB (ref 35–70)
LDL Cholesterol: 38 mg/dL
Triglycerides: 67 mg/dL (ref 40–160)

## 2015-01-18 LAB — TSH: TSH: 0.67 u[IU]/mL (ref <=5.90)

## 2015-01-26 ENCOUNTER — Other Ambulatory Visit: Payer: Self-pay | Admitting: Podiatry

## 2015-01-26 DIAGNOSIS — R0989 Other specified symptoms and signs involving the circulatory and respiratory systems: Secondary | ICD-10-CM

## 2015-01-28 ENCOUNTER — Ambulatory Visit (HOSPITAL_COMMUNITY)
Admission: RE | Admit: 2015-01-28 | Discharge: 2015-01-28 | Disposition: A | Discharge status: Home / Self Care | Payer: Medicare Other | Source: Ambulatory Visit | Attending: Cardiology | Admitting: Cardiology

## 2015-01-28 DIAGNOSIS — R0989 Other specified symptoms and signs involving the circulatory and respiratory systems: Secondary | ICD-10-CM | POA: Diagnosis not present

## 2015-02-11 ENCOUNTER — Other Ambulatory Visit: Payer: Self-pay | Admitting: Internal Medicine

## 2015-02-11 ENCOUNTER — Ambulatory Visit (INDEPENDENT_AMBULATORY_CARE_PROVIDER_SITE_OTHER): Payer: Medicare Other | Admitting: Internal Medicine

## 2015-02-11 ENCOUNTER — Encounter: Payer: Self-pay | Admitting: Internal Medicine

## 2015-02-11 VITALS — BP 108/70 | HR 85 | Temp 98.0°F | Ht 66.0 in | Wt 179.2 lb

## 2015-02-11 DIAGNOSIS — F411 Generalized anxiety disorder: Secondary | ICD-10-CM | POA: Diagnosis not present

## 2015-02-11 DIAGNOSIS — E119 Type 2 diabetes mellitus without complications: Secondary | ICD-10-CM

## 2015-02-11 DIAGNOSIS — Z0000 Encounter for general adult medical examination without abnormal findings: Secondary | ICD-10-CM | POA: Diagnosis not present

## 2015-02-11 DIAGNOSIS — I10 Essential (primary) hypertension: Secondary | ICD-10-CM | POA: Diagnosis not present

## 2015-02-11 NOTE — Assessment & Plan Note (Signed)
BP Readings from Last 3 Encounters:  02/11/15 108/70  08/11/14 112/70  08/10/14 115/78   The current medical regimen is effective;  continue present plan and medications. Refills today

## 2015-02-11 NOTE — Progress Notes (Signed)
Subjective:    Patient ID: Katie [name], female    DOB: 1945/09/09, 69 y.o.   MRN: [id number]  HPI   Here for medicare wellness  Diet: heart healthy  Physical activity: 3-4x/week weight training, treadmill for 2 mi Depression/mood screen: negative Hearing: intact to whispered voice Visual acuity: grossly normal, performs annual eye exam  ADLs: capable Fall risk: none Home safety: good Cognitive evaluation: intact to orientation, naming, recall and repetition EOL planning: adv directives, full code/ I agree  I have personally reviewed and have noted 1. The patient's medical and social history 2. Their use of alcohol, tobacco or illicit drugs 3. Their current medications and supplements 4. The patient's functional ability including ADL's, fall risks, home safety risks and hearing or visual impairment. 5. Diet and physical activities 6. Evidence for depression or mood disorders  Also reviewed chronic medical conditions, interval events and current concerns   Past Medical History  Diagnosis Date  . Unspecified essential hypertension   . Acute venous embolism and thrombosis of unspecified deep vessels of lower extremity 2001    RLE DVT and PE, coumadin x 70mo, now ASSA  . Unspecified venous (peripheral) insufficiency   . Pure hypercholesterolemia   . Type II or unspecified type diabetes mellitus without mention of complication, not stated as uncontrolled   . Unspecified hypothyroidism   . Reflux esophagitis   . Irritable bowel syndrome   . Personal history of colonic polyps   . Iron deficiency anemia secondary to blood loss (chronic)   . Other B-complex deficiencies   . Degeneration of cervical intervertebral disc   . Lumbago   . Unspecified vitamin D deficiency   . Anxiety state, unspecified   . Unspecified iridocyclitis   . History of DVT (deep vein thrombosis)    Family History  Problem Relation Age of Onset  . Prostate cancer Father   . Diabetes Mother    retinopathy/blind  . Deep vein thrombosis Mother   . Hypertension Mother    Social History  Substance Use Topics  . Smoking status: Never Smoker   . Smokeless tobacco: Never Used  . Alcohol Use: 0.0 oz/week    0 Standard drinks or equivalent per week     Comment: occasional   Review of Systems  Constitutional: Negative for fatigue and unexpected weight change.  Respiratory: Negative for cough, shortness of breath and wheezing.   Cardiovascular: Negative for chest pain, palpitations and leg swelling.  Gastrointestinal: Negative for nausea, abdominal pain and diarrhea.  Neurological: Positive for numbness (R forefoot, ongoing podiatry eval reviewed). Negative for dizziness, weakness, light-headedness and headaches.  Psychiatric/Behavioral: Positive for agitation (w/ spouse behavior ("he doesn't take care of himself and that upsets me"). Negative for dysphoric mood. The patient is not nervous/anxious.   All other systems reviewed and are negative.   Patient Care Team: Rowe Clack, MD as PCP - General (Internal Medicine) Lorne Skeens, MD (Endocrinology) Ladene Artist, MD (Gastroenterology) Rosemary Holms, DPM (Podiatry) Rutherford Guys, MD (Ophthalmology) Delila Pereyra, MD (Gynecology)     Objective:    Physical Exam  Constitutional: She appears well-developed and well-nourished. No distress.  obese  Cardiovascular: Normal rate, regular rhythm and normal heart sounds.   No murmur heard. Pulmonary/Chest: Effort normal and breath sounds normal. No respiratory distress.  Musculoskeletal: She exhibits no edema.  Vitals reviewed.   BP 108/70 mmHg  Pulse 85  Temp(Src) 98 F (36.7 C) (Oral)  Ht 5\' 6"  (1.676 m)  Wt 179 lb 4  oz (81.307 kg)  BMI 28.95 kg/m2  SpO2 98% Wt Readings from Last 3 Encounters:  02/11/15 179 lb 4 oz (81.307 kg)  08/11/14 169 lb (76.658 kg)  08/10/14 170 lb (77.111 kg)    Lab Results  Component Value Date   WBC 5.4 07/04/2007   HGB 14.5  07/04/2007   HCT 43.3 07/04/2007   PLT 252 07/04/2007   GLUCOSE 118* 05/31/2011   CHOL 119 01/18/2015   TRIG 67 01/18/2015   HDL 91* 01/18/2015   LDLDIRECT 105.9 07/04/2007   LDLCALC 38 01/18/2015   ALT 32 07/04/2007   AST 32 07/04/2007   NA 143 05/31/2011   K 5.5* 05/31/2011   CL 106 05/31/2011   CREATININE 1.1 05/31/2011   BUN 13 05/31/2011   CO2 31 05/31/2011   TSH 0.67 01/18/2015   HGBA1C 6.2* 01/18/2015    No results found.     Assessment & Plan:   AWV/z00.00 - Today patient counseled on age appropriate routine health concerns for screening and prevention, each reviewed and up to date or declined. Immunizations reviewed and up to date or declined. Labs/ECG reviewed. Risk factors for depression reviewed and negative. Hearing function and visual acuity are intact. ADLs screened and addressed as needed. Functional ability and level of safety reviewed and appropriate. Education, counseling and referrals performed based on assessed risks today. Patient provided with a copy of personalized plan for preventive services.  Problem List Items Addressed This Visit    Anxiety state    Situational, reviewed - also associated with chronic insomnia Uses chronic BZ qhs and prn Previously on sertraline without improvement, does not want to retry SSRI or other non-habit forming Refill today - support provided      Diabetes mellitus type 2, controlled    Follows with endo for same Home cbgs well controlled Reviewed last labs The current medical regimen is effective;  continue present plan and medications. And continue work on weight reduction at gym (prev >200#) Lab Results  Component Value Date   HGBA1C 6.2* 01/18/2015        Essential hypertension    BP Readings from Last 3 Encounters:  02/11/15 108/70  08/11/14 112/70  08/10/14 115/78   The current medical regimen is effective;  continue present plan and medications. Refills today       Other Visit Diagnoses    Routine  general medical examination at a health care facility    -  Primary        Gwendolyn Grant, MD

## 2015-02-11 NOTE — Patient Instructions (Signed)
It was good to see you today.  We have reviewed your prior records including labs and tests today  Health Maintenance reviewed - please schedule tetanus vaccine and consider shingles vaccine as discussed. All other  recommended immunizations and age-appropriate screenings are up-to-date.  Medications reviewed and updated, no changes recommended at this time.  Please let me know if referral to sleep specialist is desired for further evaluation of your problems staying asleep. I do not recommend any change in her medications for this problem at this time  Please schedule followup in 6 months for semiannual exam and labs, call sooner if problems.

## 2015-02-11 NOTE — Assessment & Plan Note (Signed)
Situational, reviewed - also associated with chronic insomnia Uses chronic BZ qhs and prn Previously on sertraline without improvement, does not want to retry SSRI or other non-habit forming Refill today - support provided

## 2015-02-11 NOTE — Assessment & Plan Note (Signed)
Follows with endo for same Home cbgs well controlled Reviewed last labs The current medical regimen is effective;  continue present plan and medications. And continue work on weight reduction at gym (prev >200#) Lab Results  Component Value Date   HGBA1C 6.2* 01/18/2015

## 2015-02-11 NOTE — Progress Notes (Signed)
Pre visit review using our clinic review tool, if applicable. No additional management support is needed unless otherwise documented below in the visit note. 

## 2015-02-16 ENCOUNTER — Telehealth: Payer: Self-pay | Admitting: *Deleted

## 2015-02-16 NOTE — Telephone Encounter (Signed)
Receive call pt checking status on refill for her alprazolam. In form pt per chart refill was approved yesterday by Dr. Quay Burow. Verified if assistant had script Colletta [place] stated was faxed yesterday but will re-fax to [place]...Katie [name]

## 2015-03-16 ENCOUNTER — Ambulatory Visit (INDEPENDENT_AMBULATORY_CARE_PROVIDER_SITE_OTHER): Payer: Medicare Other

## 2015-03-16 DIAGNOSIS — Z23 Encounter for immunization: Secondary | ICD-10-CM | POA: Diagnosis not present

## 2015-04-13 ENCOUNTER — Telehealth: Payer: Self-pay | Admitting: Internal Medicine

## 2015-04-13 NOTE — Telephone Encounter (Signed)
Pt states pharmacy has called in to request refill for ALPRAZolam Duanne Moron) 0.5 MG tablet [id number] [facility] on [address]

## 2015-04-14 MED ORDER — ALPRAZOLAM 0.5 MG PO TABS: 0.5000 mg | ORAL_TABLET | Freq: Three times a day (TID) | ORAL | Status: DC | PRN

## 2015-04-14 NOTE — Telephone Encounter (Signed)
Printed for pcp to sign. Note to est with new pcp

## 2015-04-14 NOTE — Telephone Encounter (Signed)
rx faxed

## 2015-04-15 ENCOUNTER — Telehealth: Payer: Self-pay | Admitting: Internal Medicine

## 2015-04-15 LAB — HM MAMMOGRAPHY

## 2015-04-15 NOTE — Telephone Encounter (Signed)
rx faxed

## 2015-04-15 NOTE — Telephone Encounter (Signed)
Patient states [facility] on [address] did not get script for alprazolam.  Please resend.

## 2015-04-20 ENCOUNTER — Encounter: Payer: Self-pay | Admitting: Internal Medicine

## 2015-05-24 ENCOUNTER — Telehealth: Payer: Self-pay | Admitting: Internal Medicine

## 2015-05-24 NOTE — Telephone Encounter (Signed)
ok 

## 2015-05-24 NOTE — Telephone Encounter (Signed)
Pt is requesting to transfer to you from Dr. Asa Lente. Please advise

## 2015-05-24 NOTE — Telephone Encounter (Signed)
Pt is requesting a refill for ALPRAZolam (XANAX) 0.5 MG tablet [id number] She has requesting for Dr. Ronnald Ramp to take her on as a patient and I have sent the request to him. Once I hear back from him I will schedule an appointment for her. Pharmacy [facility] [address]

## 2015-05-24 NOTE — Telephone Encounter (Signed)
Pt informed

## 2015-05-30 ENCOUNTER — Other Ambulatory Visit: Payer: Self-pay | Admitting: Internal Medicine

## 2015-05-31 MED ORDER — ALPRAZOLAM 0.5 MG PO TABS: 0.5000 mg | ORAL_TABLET | Freq: Three times a day (TID) | ORAL | Status: DC | PRN

## 2015-05-31 NOTE — Telephone Encounter (Signed)
done

## 2015-05-31 NOTE — Telephone Encounter (Signed)
New pt for you. Please advise if the refill rq is approved.

## 2015-06-01 ENCOUNTER — Other Ambulatory Visit: Payer: Self-pay | Admitting: Internal Medicine

## 2015-06-02 NOTE — Telephone Encounter (Signed)
rx faxed and pt informed.

## 2015-06-03 LAB — HM DIABETES EYE EXAM

## 2015-06-08 LAB — HEMOGLOBIN A1C: Hemoglobin A1C: 5.4

## 2015-06-14 ENCOUNTER — Encounter: Payer: Self-pay | Admitting: Internal Medicine

## 2015-06-14 ENCOUNTER — Ambulatory Visit (INDEPENDENT_AMBULATORY_CARE_PROVIDER_SITE_OTHER): Payer: Medicare Other | Admitting: Internal Medicine

## 2015-06-14 VITALS — BP 122/78 | HR 90 | Temp 97.5°F | Resp 16 | Ht 66.0 in | Wt 176.0 lb

## 2015-06-14 DIAGNOSIS — E538 Deficiency of other specified B group vitamins: Secondary | ICD-10-CM

## 2015-06-14 DIAGNOSIS — I10 Essential (primary) hypertension: Secondary | ICD-10-CM | POA: Diagnosis not present

## 2015-06-14 DIAGNOSIS — E119 Type 2 diabetes mellitus without complications: Secondary | ICD-10-CM | POA: Diagnosis not present

## 2015-06-14 DIAGNOSIS — E039 Hypothyroidism, unspecified: Secondary | ICD-10-CM

## 2015-06-14 DIAGNOSIS — E559 Vitamin D deficiency, unspecified: Secondary | ICD-10-CM

## 2015-06-14 DIAGNOSIS — E7800 Pure hypercholesterolemia, unspecified: Secondary | ICD-10-CM

## 2015-06-14 DIAGNOSIS — F411 Generalized anxiety disorder: Secondary | ICD-10-CM

## 2015-06-14 MED ORDER — ALPRAZOLAM 0.5 MG PO TABS: 0.5000 mg | ORAL_TABLET | Freq: Three times a day (TID) | ORAL | Status: DC | PRN

## 2015-06-14 MED ORDER — AMLODIPINE BESYLATE 5 MG PO TABS: 5.0000 mg | ORAL_TABLET | Freq: Every day | ORAL | Status: DC

## 2015-06-14 MED ORDER — LISINOPRIL 10 MG PO TABS: 10.0000 mg | ORAL_TABLET | Freq: Every day | ORAL | Status: DC

## 2015-06-14 MED ORDER — FUROSEMIDE 20 MG PO TABS: 20.0000 mg | ORAL_TABLET | Freq: Every day | ORAL | Status: DC

## 2015-06-14 NOTE — Progress Notes (Signed)
Pre visit review using our clinic review tool, if applicable. No additional management support is needed unless otherwise documented below in the visit note. 

## 2015-06-14 NOTE — Patient Instructions (Signed)
Hypertension Hypertension, commonly called high blood pressure, is when the force of blood pumping through your arteries is too strong. Your arteries are the blood vessels that carry blood from your heart throughout your body. A blood pressure reading consists of a higher number over a lower number, such as 110/72. The higher number (systolic) is the pressure inside your arteries when your heart pumps. The lower number (diastolic) is the pressure inside your arteries when your heart relaxes. Ideally you want your blood pressure below 120/80. Hypertension forces your heart to work harder to pump blood. Your arteries may become narrow or stiff. Having untreated or uncontrolled hypertension can cause heart attack, stroke, kidney disease, and other problems. RISK FACTORS Some risk factors for high blood pressure are controllable. Others are not.  Risk factors you cannot control include:   Race. You may be at higher risk if you are African American.  Age. Risk increases with age.  Gender. Men are at higher risk than women before age 45 years. After age 65, women are at higher risk than men. Risk factors you can control include:  Not getting enough exercise or physical activity.  Being overweight.  Getting too much fat, sugar, calories, or salt in your diet.  Drinking too much alcohol. SIGNS AND SYMPTOMS Hypertension does not usually cause signs or symptoms. Extremely high blood pressure (hypertensive crisis) may cause headache, anxiety, shortness of breath, and nosebleed. DIAGNOSIS To check if you have hypertension, your health care provider will measure your blood pressure while you are seated, with your arm held at the level of your heart. It should be measured at least twice using the same arm. Certain conditions can cause a difference in blood pressure between your right and left arms. A blood pressure reading that is higher than normal on one occasion does not mean that you need treatment. If  it is not clear whether you have high blood pressure, you may be asked to return on a different day to have your blood pressure checked again. Or, you may be asked to monitor your blood pressure at home for 1 or more weeks. TREATMENT Treating high blood pressure includes making lifestyle changes and possibly taking medicine. Living a healthy lifestyle can help lower high blood pressure. You may need to change some of your habits. Lifestyle changes may include:  Following the DASH diet. This diet is high in fruits, vegetables, and whole grains. It is low in salt, red meat, and added sugars.  Keep your sodium intake below 2,300 mg per day.  Getting at least 30-45 minutes of aerobic exercise at least 4 times per week.  Losing weight if necessary.  Not smoking.  Limiting alcoholic beverages.  Learning ways to reduce stress. Your health care provider may prescribe medicine if lifestyle changes are not enough to get your blood pressure under control, and if one of the following is true:  You are 18-59 years of age and your systolic blood pressure is above 140.  You are 60 years of age or older, and your systolic blood pressure is above 150.  Your diastolic blood pressure is above 90.  You have diabetes, and your systolic blood pressure is over 140 or your diastolic blood pressure is over 90.  You have kidney disease and your blood pressure is above 140/90.  You have heart disease and your blood pressure is above 140/90. Your personal target blood pressure may vary depending on your medical conditions, your age, and other factors. HOME CARE INSTRUCTIONS    Have your blood pressure rechecked as directed by your health care provider.   Take medicines only as directed by your health care provider. Follow the directions carefully. Blood pressure medicines must be taken as prescribed. The medicine does not work as well when you skip doses. Skipping doses also puts you at risk for  problems.  Do not smoke.   Monitor your blood pressure at home as directed by your health care provider. SEEK MEDICAL CARE IF:   You think you are having a reaction to medicines taken.  You have recurrent headaches or feel dizzy.  You have swelling in your ankles.  You have trouble with your vision. SEEK IMMEDIATE MEDICAL CARE IF:  You develop a severe headache or confusion.  You have unusual weakness, numbness, or feel faint.  You have severe chest or abdominal pain.  You vomit repeatedly.  You have trouble breathing. MAKE SURE YOU:   Understand these instructions.  Will watch your condition.  Will get help right away if you are not doing well or get worse.   This information is not intended to replace advice given to you by your health care provider. Make sure you discuss any questions you have with your health care provider.   Document Released: 05/01/2005 Document Revised: 09/15/2014 Document Reviewed: 02/21/2013 Elsevier Interactive Patient Education 2016 Else[employer] Inc.  

## 2015-06-14 NOTE — Progress Notes (Signed)
Subjective:  Patient ID: Katie [name], female    DOB: 10-16-1945  Age: 70 y.o. MRN: [id number]  CC: Hypertension; Hypothyroidism; and Diabetes  NEW TO ME  HPI Katie [name] presents for follow-up appointment and to establish a relationship. She complains of chronic, generalized anxiety but otherwise feels well and offers no other complaints.  Outpatient Prescriptions Prior to Visit  Medication Sig Dispense Refill  . ACCU-CHEK AVIVA PLUS test strip     . AIMSCO INSULIN SYR ULTRA THIN 31G X 5/16" 0.3 ML MISC     . aspirin 81 MG EC tablet Take 81 mg by mouth daily.      . Blood Glucose Monitoring Suppl (ACCU-CHEK AVIVA PLUS) W/DEVICE KIT     . cyanocobalamin (,VITAMIN B-12,) 1000 MCG/ML injection 1000 mcg subq once a month 30 mL 1  . ergocalciferol (VITAMIN D2) 50000 UNITS capsule Take 50,000 Units by mouth once a week.      [employer] Gu[employer] Devices (SIMPLE DIAGNOSTICS LANCING DEV) MISC     . levothyroxine (SYNTHROID, LEVOTHROID) 88 MCG tablet Take 88 mcg by mouth daily.      Marland Kitchen lisinopril (PRINIVIL,ZESTRIL) 10 MG tablet TAKE ONE TABLET BY MOUTH ONCE DAILY 30 tablet 0  . rosuvastatin (CRESTOR) 20 MG tablet Take 1/2 daily     . tiZANidine (ZANAFLEX) 4 MG tablet Take 1 tablet (4 mg total) by mouth 3 (three) times daily. 90 tablet 0  . traMADol (ULTRAM) 50 MG tablet Take 1 tablet (50 mg total) by mouth every 8 (eight) hours as needed. 90 tablet 5  . vitamin E 100 UNIT capsule Take 400 Units by mouth daily.    Marland Kitchen ALPRAZolam (XANAX) 0.5 MG tablet Take 1 tablet (0.5 mg total) by mouth 3 (three) times daily as needed for anxiety or sleep. Must establish with NEW PCP for additional refills. 90 tablet 0  . amLODipine (NORVASC) 5 MG tablet Take 1 tablet (5 mg total) by mouth daily. 30 tablet 0  . furosemide (LASIX) 20 MG tablet Take 1 tablet (20 mg total) by mouth daily. 90 tablet 2  . lisinopril (PRINIVIL,ZESTRIL) 10 MG tablet Take 1 tablet (10 mg total) by mouth daily. 30 tablet 0  . AIMSCO INSULIN SYR ULTRA  THIN 31G X 5/16" 0.3 ML MISC     . exenatide (BYETTA 10 MCG PEN) 10 MCG/0.04ML SOLN Inject 10 mcg into the skin 2 (two) times daily with a meal.       No facility-administered medications prior to visit.    ROS Review of Systems  Constitutional: Negative.  Negative for fever, chills, diaphoresis, appetite change and fatigue.  HENT: Negative.  Negative for sinus pressure, sore throat and trouble swallowing.   Eyes: Negative.   Respiratory: Negative.  Negative for cough, choking, chest tightness, shortness of breath and stridor.   Cardiovascular: Negative.  Negative for chest pain, palpitations and leg swelling.  Gastrointestinal: Negative.  Negative for nausea, vomiting, abdominal pain, diarrhea, constipation and blood in stool.  Endocrine: Negative.   Genitourinary: Negative.   Musculoskeletal: Negative.  Negative for myalgias, back pain, arthralgias and neck pain.  Skin: Negative.   Allergic/Immunologic: Negative.   Neurological: Negative.  Negative for dizziness, weakness, light-headedness and numbness.  Hematological: Negative.  Negative for adenopathy. Does not bruise/bleed easily.  Psychiatric/Behavioral: Negative for suicidal ideas, self-injury, dysphoric mood and decreased concentration. The patient is nervous/anxious.     Objective:  BP 122/78 mmHg  Pulse 90  Temp(Src) 97.5 F (36.4 C) (Oral)  Resp 16  Ht 5' 6"  (1.676 m)  Wt 176 lb (79.833 kg)  BMI 28.42 kg/m2  SpO2 96%  BP Readings from Last 3 Encounters:  06/14/15 122/78  02/11/15 108/70  08/11/14 112/70    Wt Readings from Last 3 Encounters:  06/14/15 176 lb (79.833 kg)  02/11/15 179 lb 4 oz (81.307 kg)  08/11/14 169 lb (76.658 kg)    Physical Exam  Constitutional: She is oriented to person, place, and time. No distress.  HENT:  Mouth/Throat: Oropharynx is clear and moist. No oropharyngeal exudate.  Eyes: Conjunctivae are normal. Right eye exhibits no discharge. Left eye exhibits no discharge. No scleral  icterus.  Neck: Normal range of motion. Neck supple. No JVD present. No tracheal deviation present. No thyromegaly present.  Cardiovascular: Normal rate, regular rhythm, normal heart sounds and intact distal pulses.  Exam reveals no gallop and no friction rub.   No murmur heard. Pulmonary/Chest: Effort normal and breath sounds normal. No stridor. No respiratory distress. She has no wheezes. She has no rales. She exhibits no tenderness.  Abdominal: Soft. Bowel sounds are normal. She exhibits no distension and no mass. There is no tenderness. There is no rebound and no guarding.  Musculoskeletal: Normal range of motion. She exhibits no edema or tenderness.  Lymphadenopathy:    She has no cervical adenopathy.  Neurological: She is oriented to person, place, and time.  Skin: Skin is warm and dry. No rash noted. She is not diaphoretic. No erythema. No pallor.  Psychiatric: She has a normal mood and affect. Her behavior is normal. Judgment and thought content normal.  Vitals reviewed.   Lab Results  Component Value Date   WBC 5.4 07/04/2007   HGB 14.5 07/04/2007   HCT 43.3 07/04/2007   PLT 252 07/04/2007   GLUCOSE 118* 05/31/2011   CHOL 119 01/18/2015   TRIG 67 01/18/2015   HDL 91* 01/18/2015   LDLDIRECT 105.9 07/04/2007   LDLCALC 38 01/18/2015   ALT 32 07/04/2007   AST 32 07/04/2007   NA 143 05/31/2011   K 5.5* 05/31/2011   CL 106 05/31/2011   CREATININE 1.1 05/31/2011   BUN 13 05/31/2011   CO2 31 05/31/2011   TSH 0.67 01/18/2015   HGBA1C 5.4 06/08/2015    No results found.  Assessment & Plan:   [name] was seen today for hypertension, hypothyroidism and diabetes.  Diagnoses and all orders for this visit:  Hypothyroidism, unspecified hypothyroidism type- her TSH is in the normal range, we'll continue the current dose of Synthroid.  Essential hypertension- her blood pressure is well-controlled, will continue current medications, will monitor her lites and renal function. -      lisinopril (PRINIVIL,ZESTRIL) 10 MG tablet; Take 1 tablet (10 mg total) by mouth daily. -     amLODipine (NORVASC) 5 MG tablet; Take 1 tablet (5 mg total) by mouth daily. -     furosemide (LASIX) 20 MG tablet; Take 1 tablet (20 mg total) by mouth daily. -     Basic metabolic panel; Future -     Urinalysis, Routine w reflex microscopic (not at [facility]); Future  Controlled type 2 diabetes mellitus without complication, without long-term current use of insulin ([place])- her blood sugars are adequately well controlled, she is due for her annual eye exam. -     lisinopril (PRINIVIL,ZESTRIL) 10 MG tablet; Take 1 tablet (10 mg total) by mouth daily. -     Microalbumin / creatinine urine ratio; Future -     Ambulatory referral to  Ophthalmology  B12 deficiency- will monitor her CBC and her vitamin B12 level. -     CBC with Differential/Platelet; Future -     Vitamin B12; Future  Vitamin D deficiency -     VITAMIN D 25 Hydroxy (Vit-D Deficiency, Fractures); Future  HYPERCHOLESTEROLEMIA- she has achieved her LDL goal.  GAD (generalized anxiety disorder) -     ALPRAZolam (XANAX) 0.5 MG tablet; Take 1 tablet (0.5 mg total) by mouth 3 (three) times daily as needed for anxiety or sleep. Must establish with NEW PCP for additional refills.  I have discontinued Ms. Mccowen's exenatide. I am also having her maintain her rosuvastatin, aspirin, levothyroxine, ergocalciferol, vitamin E, ACCU-CHEK AVIVA PLUS, ACCU-CHEK AVIVA PLUS, tiZANidine, cyanocobalamin, AIMSCO INSULIN SYR ULTRA THIN, traMADol, SIMPLE DIAGNOSTICS LANCING DEV, lisinopril, TRULICITY, lisinopril, amLODipine, ALPRAZolam, and furosemide.  Meds ordered this encounter  Medications  . TRULICITY 1.5 YT/0.3TW SOPN    Sig:   . DISCONTD: AIMSCO INSULIN SYR ULTRA THIN 31G X 5/16" 0.3 ML MISC    Sig:   . lisinopril (PRINIVIL,ZESTRIL) 10 MG tablet    Sig: Take 1 tablet (10 mg total) by mouth daily.    Dispense:  90 tablet    Refill:  1  . amLODipine  (NORVASC) 5 MG tablet    Sig: Take 1 tablet (5 mg total) by mouth daily.    Dispense:  90 tablet    Refill:  1  . ALPRAZolam (XANAX) 0.5 MG tablet    Sig: Take 1 tablet (0.5 mg total) by mouth 3 (three) times daily as needed for anxiety or sleep. Must establish with NEW PCP for additional refills.    Dispense:  90 tablet    Refill:  0  . furosemide (LASIX) 20 MG tablet    Sig: Take 1 tablet (20 mg total) by mouth daily.    Dispense:  90 tablet    Refill:  1     Follow-up: Return in about 6 months (around 12/12/2015).  Scarlette Calico, MD

## 2015-07-05 ENCOUNTER — Telehealth: Payer: Self-pay | Admitting: *Deleted

## 2015-07-05 DIAGNOSIS — I10 Essential (primary) hypertension: Secondary | ICD-10-CM

## 2015-07-05 MED ORDER — AMLODIPINE BESYLATE 5 MG PO TABS: 5.0000 mg | ORAL_TABLET | Freq: Every day | ORAL | Status: DC

## 2015-07-05 MED ORDER — FUROSEMIDE 20 MG PO TABS: 20.0000 mg | ORAL_TABLET | Freq: Every day | ORAL | Status: DC

## 2015-07-05 MED ORDER — LISINOPRIL 10 MG PO TABS: 10.0000 mg | ORAL_TABLET | Freq: Every day | ORAL | Status: DC

## 2015-07-05 NOTE — Telephone Encounter (Signed)
Left msg on triagestating needing new rx sent on her Lasix, lisinopril ,and amlodipine sent to [facility] for 90 day...Katie [name]

## 2015-07-05 NOTE — Telephone Encounter (Signed)
Notified pt rx's sent to [place]...Katie [name]

## 2015-07-26 ENCOUNTER — Ambulatory Visit: Payer: Medicare Other | Admitting: Internal Medicine

## 2015-09-25 ENCOUNTER — Other Ambulatory Visit: Payer: Self-pay | Admitting: Internal Medicine

## 2015-09-27 NOTE — Telephone Encounter (Signed)
Pt had called triage wanting to pick rx up inform pt will leave at the front desk for p/u...Katie [name]

## 2015-10-07 ENCOUNTER — Telehealth: Payer: Self-pay

## 2015-10-07 NOTE — Telephone Encounter (Signed)
Pt has a follow up appt with PCP on 11/2015. No annual/CPE scheduled or completed for 2017.  Patient is on the list for [facility] 2017 and may be a good candidate for an AWV in 2017.

## 2015-11-02 NOTE — Telephone Encounter (Signed)
AWV scheduled for 10/4 at 10am

## 2015-11-18 ENCOUNTER — Telehealth: Payer: Self-pay | Admitting: *Deleted

## 2015-11-18 MED ORDER — ALPRAZOLAM 0.5 MG PO TABS: ORAL_TABLET | ORAL | Status: DC

## 2015-11-18 NOTE — Telephone Encounter (Signed)
done

## 2015-11-18 NOTE — Telephone Encounter (Signed)
Rec'd fax pt requesting renewal on her Alprazolam 90 day supply...Katie [name]

## 2015-11-18 NOTE — Telephone Encounter (Signed)
Faxed script back to [facility].../lmb 

## 2015-12-13 ENCOUNTER — Ambulatory Visit (INDEPENDENT_AMBULATORY_CARE_PROVIDER_SITE_OTHER): Payer: Medicare Other | Admitting: Internal Medicine

## 2015-12-13 ENCOUNTER — Encounter: Payer: Self-pay | Admitting: Internal Medicine

## 2015-12-13 ENCOUNTER — Other Ambulatory Visit (INDEPENDENT_AMBULATORY_CARE_PROVIDER_SITE_OTHER): Payer: Medicare Other

## 2015-12-13 VITALS — BP 126/72 | HR 72 | Temp 98.8°F | Resp 16 | Wt 176.0 lb

## 2015-12-13 DIAGNOSIS — R21 Rash and other nonspecific skin eruption: Secondary | ICD-10-CM | POA: Diagnosis not present

## 2015-12-13 DIAGNOSIS — E038 Other specified hypothyroidism: Secondary | ICD-10-CM | POA: Diagnosis not present

## 2015-12-13 DIAGNOSIS — E785 Hyperlipidemia, unspecified: Secondary | ICD-10-CM

## 2015-12-13 DIAGNOSIS — I10 Essential (primary) hypertension: Secondary | ICD-10-CM

## 2015-12-13 DIAGNOSIS — E1121 Type 2 diabetes mellitus with diabetic nephropathy: Secondary | ICD-10-CM | POA: Diagnosis not present

## 2015-12-13 DIAGNOSIS — E538 Deficiency of other specified B group vitamins: Secondary | ICD-10-CM

## 2015-12-13 LAB — LIPID PANEL
Cholesterol: 119 mg/dL (ref 0–200)
HDL: 78.4 mg/dL (ref >39.00)
LDL Cholesterol: 29 mg/dL (ref 0–99)
NonHDL: 40.77
Total CHOL/HDL Ratio: 2
Triglycerides: 57 mg/dL (ref 0.0–149.0)
VLDL: 11.4 mg/dL (ref 0.0–40.0)

## 2015-12-13 LAB — CBC WITH DIFFERENTIAL/PLATELET
Basophils Absolute: 0 10*3/uL (ref 0.0–0.1)
Basophils Relative: 0.6 % (ref 0.0–3.0)
Eosinophils Absolute: 0.2 10*3/uL (ref 0.0–0.7)
Eosinophils Relative: 4.8 % (ref 0.0–5.0)
HCT: 38.9 % (ref 36.0–46.0)
Hemoglobin: 13.3 g/dL (ref 12.0–15.0)
Lymphocytes Relative: 39.5 % (ref 12.0–46.0)
Lymphs Abs: 1.8 10*3/uL (ref 0.7–4.0)
MCHC: 34 g/dL (ref 30.0–36.0)
MCV: 80 fl (ref 78.0–100.0)
Monocytes Absolute: 0.4 10*3/uL (ref 0.1–1.0)
Monocytes Relative: 9.5 % (ref 3.0–12.0)
Neutro Abs: 2.1 10*3/uL (ref 1.4–7.7)
Neutrophils Relative %: 45.6 % (ref 43.0–77.0)
Platelets: 218 10*3/uL (ref 150.0–400.0)
RBC: 4.87 Mil/uL (ref 3.87–5.11)
RDW: 14.8 % (ref 11.5–15.5)
WBC: 4.6 10*3/uL (ref 4.0–10.5)

## 2015-12-13 LAB — BASIC METABOLIC PANEL
BUN: 13 mg/dL (ref 6–23)
CO2: 33 mEq/L — ABNORMAL HIGH (ref 19–32)
Calcium: 10 mg/dL (ref 8.4–10.5)
Chloride: 104 mEq/L (ref 96–112)
Creatinine, Ser: 1.22 mg/dL — ABNORMAL HIGH (ref 0.40–1.20)
GFR: 55.89 mL/min — ABNORMAL LOW (ref >60.00)
Glucose, Bld: 100 mg/dL — ABNORMAL HIGH (ref 70–99)
Potassium: 4.9 mEq/L (ref 3.5–5.1)
Sodium: 142 mEq/L (ref 135–145)

## 2015-12-13 LAB — TSH: TSH: 0.89 u[IU]/mL (ref 0.35–4.50)

## 2015-12-13 LAB — HEMOGLOBIN A1C: Hgb A1c MFr Bld: 5.3 % (ref 4.6–6.5)

## 2015-12-13 NOTE — Progress Notes (Signed)
Subjective:  Patient ID: Katie [name], female    DOB: 10/07/1945  Age: 70 y.o. MRN: [id number]  CC: Hypertension; Hypothyroidism; Diabetes; Back Pain; and Rash   HPI Katie [name] presents for monitoring of several medical issues. Katie [name] complains of a rash on her face and wants to see a dermatologist. Katie [name] describes the rash as a few pale spots that are otherwise asymptomatic. Katie [name] has not treated it.  Her other medical problems appear to be well controlled. Katie [name] tells me overall Katie [name] feels well today. Katie [name] sees an endocrinologist regarding her diabetes. Katie [name] feels like her thyroid dosage is adequate as Katie [name] has had no recent episodes of fatigue, weight changes, constipation, or diarrhea.  Katie [name] has occasional episodes of low back pain that is well-controlled with tramadol.  Katie [name] tells me her blood pressures been well controlled combination of amlodipine and lisinopril.  Outpatient Medications Prior to Visit  Medication Sig Dispense Refill  . ACCU-CHEK AVIVA PLUS test strip     . AIMSCO INSULIN SYR ULTRA THIN 31G X 5/16" 0.3 ML MISC     . ALPRAZolam (XANAX) 0.5 MG tablet TAKE ONE TABLET BY MOUTH THREE TIMES DAILY AS NEEDED FOR ANXIETY OR SLEEP 90 tablet 3  . amLODipine (NORVASC) 5 MG tablet Take 1 tablet (5 mg total) by mouth daily. 90 tablet 1  . aspirin 81 MG EC tablet Take 81 mg by mouth daily.      . Blood Glucose Monitoring Suppl (ACCU-CHEK AVIVA PLUS) W/DEVICE KIT     . cyanocobalamin (,VITAMIN B-12,) 1000 MCG/ML injection 1000 mcg subq once a month 30 mL 1  . ergocalciferol (VITAMIN D2) 50000 UNITS capsule Take 50,000 Units by mouth once a week.      . furosemide (LASIX) 20 MG tablet Take 1 tablet (20 mg total) by mouth daily. 90 tablet 1  . [employer] Devices (SIMPLE DIAGNOSTICS LANCING DEV) MISC     . levothyroxine (SYNTHROID, LEVOTHROID) 88 MCG tablet Take 88 mcg by mouth daily.      Marland Kitchen lisinopril (PRINIVIL,ZESTRIL) 10 MG tablet Take 1 tablet (10 mg total) by mouth daily. 90 tablet 1  .  rosuvastatin (CRESTOR) 20 MG tablet Take 1/2 daily     . tiZANidine (ZANAFLEX) 4 MG tablet Take 1 tablet (4 mg total) by mouth 3 (three) times daily. 90 tablet 0  . traMADol (ULTRAM) 50 MG tablet Take 1 tablet (50 mg total) by mouth every 8 (eight) hours as needed. 90 tablet 5  . TRULICITY 1.5 PH/1.5AV SOPN     . vitamin E 100 UNIT capsule Take 400 Units by mouth daily.    Marland Kitchen lisinopril (PRINIVIL,ZESTRIL) 10 MG tablet Take 1 tablet (10 mg total) by mouth daily. 90 tablet 1   No facility-administered medications prior to visit.     ROS Review of Systems  Constitutional: Negative.  Negative for activity change, appetite change, chills, fatigue and fever.  HENT: Negative.   Eyes: Negative.  Negative for visual disturbance.  Respiratory: Negative for cough, choking, chest tightness, shortness of breath, wheezing and stridor.   Cardiovascular: Negative.  Negative for chest pain, palpitations and leg swelling.  Gastrointestinal: Negative.  Negative for abdominal pain, constipation, diarrhea, nausea and vomiting.  Endocrine: Negative.  Negative for polydipsia, polyphagia and polyuria.  Genitourinary: Negative.  Negative for difficulty urinating, dysuria and frequency.  Musculoskeletal: Positive for back pain. Negative for arthralgias, myalgias and neck pain.  Skin: Positive for color change and rash.  Allergic/Immunologic: Negative.   Neurological: Negative.  Negative  for dizziness.  Hematological: Negative.  Negative for adenopathy. Does not bruise/bleed easily.  Psychiatric/Behavioral: Negative.     Objective:  BP 126/72   Pulse 72   Temp 98.8 F (37.1 C) (Oral)   Resp 16   Wt 176 lb (79.8 kg)   SpO2 96%   BMI 28.41 kg/m   BP Readings from Last 3 Encounters:  12/13/15 126/72  06/14/15 122/78  02/11/15 108/70    Wt Readings from Last 3 Encounters:  12/13/15 176 lb (79.8 kg)  06/14/15 176 lb (79.8 kg)  02/11/15 179 lb 4 oz (81.3 kg)    Physical Exam  Constitutional: Katie [name] is  oriented to person, place, and time. No distress.  HENT:  Mouth/Throat: Oropharynx is clear and moist. No oropharyngeal exudate.  Eyes: Conjunctivae are normal. Right eye exhibits no discharge. Left eye exhibits no discharge. No scleral icterus.  Neck: Normal range of motion. Neck supple. No JVD present. No tracheal deviation present. No thyromegaly present.  Cardiovascular: Normal rate, regular rhythm, normal heart sounds and intact distal pulses.  Exam reveals no gallop and no friction rub.   No murmur heard. Pulmonary/Chest: Effort normal and breath sounds normal. No stridor. No respiratory distress. Katie [name] has no wheezes. Katie [name] has no rales. Katie [name] exhibits no tenderness.  Abdominal: Soft. Bowel sounds are normal. Katie [name] exhibits no distension and no mass. There is no tenderness. There is no rebound and no guarding.  Musculoskeletal: Normal range of motion. Katie [name] exhibits no edema, tenderness or deformity.  Lymphadenopathy:    Katie [name] has no cervical adenopathy.  Neurological: Katie [name] is oriented to person, place, and time.  Skin: Rash noted. Rash is macular. Rash is not papular and not urticarial. Katie [name] is not diaphoretic.  There is a faint, round, hyperpigmented macule on the lower part of her right malar surface.  Vitals reviewed.   Lab Results  Component Value Date   WBC 4.6 12/13/2015   HGB 13.3 12/13/2015   HCT 38.9 12/13/2015   PLT 218.0 12/13/2015   GLUCOSE 100 (H) 12/13/2015   CHOL 119 12/13/2015   TRIG 57.0 12/13/2015   HDL 78.40 12/13/2015   LDLDIRECT 105.9 07/04/2007   LDLCALC 29 12/13/2015   ALT 32 07/04/2007   AST 32 07/04/2007   NA 142 12/13/2015   K 4.9 12/13/2015   CL 104 12/13/2015   CREATININE 1.22 (H) 12/13/2015   BUN 13 12/13/2015   CO2 33 (H) 12/13/2015   TSH 0.89 12/13/2015   HGBA1C 5.3 12/13/2015    No results found.  Assessment & Plan:   Katie [name] was seen today for hypertension, hypothyroidism, diabetes, back pain and rash.  Diagnoses and all orders for this  visit:  Essential hypertension- Her blood pressure is adequately well-controlled, electrolytes and renal function are stable. -     Basic metabolic panel; Future  Controlled type 2 diabetes mellitus with diabetic nephropathy, without long-term current use of insulin ([place])- her A1c is down to 5.3%, I think her blood sugars are over controlled and of asked her to discuss the discontinuation of Trulicity with her endocrinologist. -     Basic metabolic panel; Future -     Hemoglobin A1c; Future  Other specified hypothyroidism- her TSH is in the normal range, Katie [name] will remain on the current dose of levothyroxine. -     TSH; Future  Rash of face -     Ambulatory referral to Dermatology  B12 deficiency- improvement noted. -     CBC with Differential/Platelet; Future  Hyperlipidemia with  target LDL less than 100- Katie [name] has achieved her LDL goal is doing well on the statin. -     Lipid panel; Future   I am having Katie [name] maintain her rosuvastatin, aspirin, levothyroxine, ergocalciferol, vitamin E, ACCU-CHEK AVIVA PLUS, ACCU-CHEK AVIVA PLUS, tiZANidine, cyanocobalamin, AIMSCO INSULIN SYR ULTRA THIN, traMADol, SIMPLE DIAGNOSTICS LANCING DEV, TRULICITY, amLODipine, lisinopril, furosemide, and ALPRAZolam.  No orders of the defined types were placed in this encounter.    Follow-up: Return in about 6 months (around 06/14/2016).  Scarlette Calico, MD

## 2015-12-13 NOTE — Progress Notes (Signed)
Pre visit review using our clinic review tool, if applicable. No additional management support is needed unless otherwise documented below in the visit note. 

## 2015-12-13 NOTE — Patient Instructions (Signed)
Hypertension Hypertension, commonly called high blood pressure, is when the force of blood pumping through your arteries is too strong. Your arteries are the blood vessels that carry blood from your heart throughout your body. A blood pressure reading consists of a higher number over a lower number, such as 110/72. The higher number (systolic) is the pressure inside your arteries when your heart pumps. The lower number (diastolic) is the pressure inside your arteries when your heart relaxes. Ideally you want your blood pressure below 120/80. Hypertension forces your heart to work harder to pump blood. Your arteries may become narrow or stiff. Having untreated or uncontrolled hypertension can cause heart attack, stroke, kidney disease, and other problems. RISK FACTORS Some risk factors for high blood pressure are controllable. Others are not.  Risk factors you cannot control include:   Race. You may be at higher risk if you are African American.  Age. Risk increases with age.  Gender. Men are at higher risk than women before age 45 years. After age 65, women are at higher risk than men. Risk factors you can control include:  Not getting enough exercise or physical activity.  Being overweight.  Getting too much fat, sugar, calories, or salt in your diet.  Drinking too much alcohol. SIGNS AND SYMPTOMS Hypertension does not usually cause signs or symptoms. Extremely high blood pressure (hypertensive crisis) may cause headache, anxiety, shortness of breath, and nosebleed. DIAGNOSIS To check if you have hypertension, your health care provider will measure your blood pressure while you are seated, with your arm held at the level of your heart. It should be measured at least twice using the same arm. Certain conditions can cause a difference in blood pressure between your right and left arms. A blood pressure reading that is higher than normal on one occasion does not mean that you need treatment. If  it is not clear whether you have high blood pressure, you may be asked to return on a different day to have your blood pressure checked again. Or, you may be asked to monitor your blood pressure at home for 1 or more weeks. TREATMENT Treating high blood pressure includes making lifestyle changes and possibly taking medicine. Living a healthy lifestyle can help lower high blood pressure. You may need to change some of your habits. Lifestyle changes may include:  Following the DASH diet. This diet is high in fruits, vegetables, and whole grains. It is low in salt, red meat, and added sugars.  Keep your sodium intake below 2,300 mg per day.  Getting at least 30-45 minutes of aerobic exercise at least 4 times per week.  Losing weight if necessary.  Not smoking.  Limiting alcoholic beverages.  Learning ways to reduce stress. Your health care provider may prescribe medicine if lifestyle changes are not enough to get your blood pressure under control, and if one of the following is true:  You are 18-59 years of age and your systolic blood pressure is above 140.  You are 60 years of age or older, and your systolic blood pressure is above 150.  Your diastolic blood pressure is above 90.  You have diabetes, and your systolic blood pressure is over 140 or your diastolic blood pressure is over 90.  You have kidney disease and your blood pressure is above 140/90.  You have heart disease and your blood pressure is above 140/90. Your personal target blood pressure may vary depending on your medical conditions, your age, and other factors. HOME CARE INSTRUCTIONS    Have your blood pressure rechecked as directed by your health care provider.   Take medicines only as directed by your health care provider. Follow the directions carefully. Blood pressure medicines must be taken as prescribed. The medicine does not work as well when you skip doses. Skipping doses also puts you at risk for  problems.  Do not smoke.   Monitor your blood pressure at home as directed by your health care provider. SEEK MEDICAL CARE IF:   You think you are having a reaction to medicines taken.  You have recurrent headaches or feel dizzy.  You have swelling in your ankles.  You have trouble with your vision. SEEK IMMEDIATE MEDICAL CARE IF:  You develop a severe headache or confusion.  You have unusual weakness, numbness, or feel faint.  You have severe chest or abdominal pain.  You vomit repeatedly.  You have trouble breathing. MAKE SURE YOU:   Understand these instructions.  Will watch your condition.  Will get help right away if you are not doing well or get worse.   This information is not intended to replace advice given to you by your health care provider. Make sure you discuss any questions you have with your health care provider.   Document Released: 05/01/2005 Document Revised: 09/15/2014 Document Reviewed: 02/21/2013 Elsevier Interactive Patient Education 2016 Else[employer] Inc.  

## 2016-01-03 ENCOUNTER — Other Ambulatory Visit: Payer: Self-pay | Admitting: *Deleted

## 2016-01-03 DIAGNOSIS — I10 Essential (primary) hypertension: Secondary | ICD-10-CM

## 2016-01-03 MED ORDER — LISINOPRIL 10 MG PO TABS: 10.0000 mg | ORAL_TABLET | Freq: Every day | ORAL | 0 refills | Status: DC

## 2016-01-03 MED ORDER — AMLODIPINE BESYLATE 5 MG PO TABS: 5.0000 mg | ORAL_TABLET | Freq: Every day | ORAL | 0 refills | Status: DC

## 2016-02-01 ENCOUNTER — Other Ambulatory Visit: Payer: Self-pay | Admitting: *Deleted

## 2016-02-01 DIAGNOSIS — I10 Essential (primary) hypertension: Secondary | ICD-10-CM

## 2016-02-01 MED ORDER — AMLODIPINE BESYLATE 5 MG PO TABS: 5.0000 mg | ORAL_TABLET | Freq: Every day | ORAL | 0 refills | Status: DC

## 2016-02-15 NOTE — Progress Notes (Addendum)
Subjective:   Katie [name] is a 70 y.o. female who presents for Medicare Annual (Subsequent) preventive examination.  Medical hx DM 2- J8I 5.3; taking Trulicity Hypercholesterolemia- chol 119; HDL 78; T2P 5.3  Off Trulicity; no samples in sept; stopped x 3 weeks ago; Dr. Elyse Hsu managing BS yesterday was 23;  Very  Concerned over diabetes as FBS were 90 and now 125; Reviewed the numbers but she takes very good care of her health. Will outreach rep as she has been declined free med by [employer] based on income. Also discussed weight loss will assist A1c  Agreed to fup with Diabetes center as she did not normally eat breakfast; small lunch and then has dinner.    Psychosocial; (Mother DM; DVT; HTN;father had prostate ca)    Social History   Social History  . Marital status: Married    Spouse name: Jameeka Marcy  . Number of children: 0  . Years of education: N/A   Occupational History  . record specialist   .  Unemployed   Social History Main Topics  . Smoking status: Never Smoker  . Smokeless tobacco: Never Used  . Alcohol use 0.0 oz/week     Comment: occasional  . Drug use: No  . Sexual activity: Not on file   Other Topics Concern  . Not on file   Social History Narrative  . No narrative on file   Tobacco; does not smoke ETOH; rarely  BMI 30   Diet Would like to lose 25 lbs; Does not eat 3 meals; boiled eggs;  Dinner; Breakfast skips some;  Educated regarding eating well; 3 meals a day; 400 calories per meal if possible; Referring to the Diabetes and weight management for review    Exercise;  Goes to the gym 3 to 4 times a week; Treadmill and walks x 30 min Busy working housekeeping and other task other weekdays  Safety review and information given on personal and community safety    Health Maintenance Due  Topic Date Due  . Hepatitis C Screening  02-05-1946  . ZOSTAVAX  01/05/2005  . FOOT EXAM  02/11/2015  . INFLUENZA VACCINE  12/14/2015   Hep  C (order in epic) ; will draw at next blood draw;  Zostavax; Will get this year; or next year Dexa; Bone density; Dr. Janese Banks; (GYN) stated it was normal;  Was with [facility] physicians now going to Going to Dr. Demaris Callander. GYN  -  Pap smear test next March;   Foot exam; [place]; Was told her pulses were good in both foot Has some numbness on bottom of right foot; has a Neuroma; self reported on the right  Referred to DR. Jones to examine and discuss options.   Eye exam - 05/2014 No retinopathy;  Has annual; Dr. Celso Sickle Every Jan 2017; no retinopathy or glaucoma   Flu vaccine/ took the 22nd of September; taken at medical service center at [place] at the Medical service center; gives flu shots for retires   Colonoscopy 08/2013 / Due 08/2018 Mammogram 04/2015 / has annual  PAP 07/2013; will be due March 2018  Periods of anxiety; takes xanax.  Ad8 score 0   Cardiac Risk Factors include: advanced age (>53mn, >>68women);dyslipidemia;hypertension    Objective:     Vitals: BP 100/70   Ht _0  (1.651 m)   Wt 181 lb 8 oz (82.3 kg)   BMI 30.20 kg/m   Body mass index is 30.2 kg/m.   Tobacco History  Smoking Status  . Never Smoker  Smokeless Tobacco  . Never Used     Counseling given: Yes   Past Medical History:  Diagnosis Date  . Acute venous embolism and thrombosis of unspecified deep vessels of lower extremity 2001   RLE DVT and PE, coumadin x 35mo now ASSA  . Anxiety state, unspecified   . Degeneration of cervical intervertebral disc   . History of DVT (deep vein thrombosis)   . Iron deficiency anemia secondary to blood loss (chronic)   . Irritable bowel syndrome   . Lumbago   . Other B-complex deficiencies   . Personal history of colonic polyps   . Pure hypercholesterolemia   . Reflux esophagitis   . Type II or unspecified type diabetes mellitus without mention of complication, not stated as uncontrolled   . Unspecified essential hypertension   .  Unspecified hypothyroidism   . Unspecified iridocyclitis   . Unspecified venous (peripheral) insufficiency   . Unspecified vitamin D deficiency    Past Surgical History:  Procedure Laterality Date  . CARPAL TUNNEL RELEASE    . FEMORAL HERNIA REPAIR    . OOPHORECTOMY    . VESICOVAGINAL FISTULA CLOSURE W/ TAH     Family History  Problem Relation Age of Onset  . Prostate cancer Father   . Diabetes Mother     retinopathy/blind  . Deep vein thrombosis Mother   . Hypertension Mother    History  Sexual Activity  . Sexual activity: Not on file    Outpatient Encounter Prescriptions as of 02/16/2016  Medication Sig  . ACCU-CHEK AVIVA PLUS test strip   . AIMSCO INSULIN SYR ULTRA THIN 31G X 5/16" 0.3 ML MISC   . ALPRAZolam (XANAX) 0.5 MG tablet TAKE ONE TABLET BY MOUTH THREE TIMES DAILY AS NEEDED FOR ANXIETY OR SLEEP  . amLODipine (NORVASC) 5 MG tablet Take 1 tablet (5 mg total) by mouth daily. Yearly physical due in Sept must see Md for refills  . aspirin 81 MG EC tablet Take 81 mg by mouth daily.    . Blood Glucose Monitoring Suppl (ACCU-CHEK AVIVA PLUS) W/DEVICE KIT   . cyanocobalamin (,VITAMIN B-12,) 1000 MCG/ML injection 1000 mcg subq once a month  . ergocalciferol (VITAMIN D2) 50000 UNITS capsule Take 50,000 Units by mouth once a week.    . furosemide (LASIX) 20 MG tablet Take 1 tablet (20 mg total) by mouth daily.  .Marland Kitchenlevothyroxine (SYNTHROID, LEVOTHROID) 88 MCG tablet Take 88 mcg by mouth daily.    .Marland Kitchenlisinopril (PRINIVIL,ZESTRIL) 10 MG tablet Take 1 tablet (10 mg total) by mouth daily. Yearly physical due in sept must see Md for refills  . rosuvastatin (CRESTOR) 20 MG tablet Take 1/2 daily   . tiZANidine (ZANAFLEX) 4 MG tablet Take 1 tablet (4 mg total) by mouth 3 (three) times daily.  . traMADol (ULTRAM) 50 MG tablet Take 1 tablet (50 mg total) by mouth every 8 (eight) hours as needed.  . vitamin E 100 UNIT capsule Take 400 Units by mouth daily.  .Elmore GuiseDevices (SIMPLE  DIAGNOSTICS LANCING DEV) MISC   . TRULICITY 1.5 MBW/6.2MBSOPN    No facility-administered encounter medications on file as of 02/16/2016.     Activities of Daily Living In your present state of health, do you have any difficulty performing the following activities: 02/16/2016  Hearing? N  Vision? N  Difficulty concentrating or making decisions? N  Walking or climbing stairs? N  Dressing or bathing? N  Doing errands,  shopping? N  Preparing Food and eating ? N  Using the Toilet? N  In the past six months, have you accidently leaked urine? N  Do you have problems with loss of bowel control? N  Managing your Medications? Y  Managing your Finances? N  Housekeeping or managing your Housekeeping? N  Some recent data might be hidden    Patient Care Team: Janith Lima, MD as PCP - General (Internal Medicine) Lorne Skeens, MD (Endocrinology) Ladene Artist, MD (Gastroenterology) Rosemary Holms, DPM (Podiatry) Rutherford Guys, MD (Ophthalmology) Delila Pereyra, MD (Gynecology)    Assessment:     Exercise Activities and Dietary recommendations Current Exercise Habits: Structured exercise class, Type of exercise: walking;treadmill;strength training/weights, Time (Minutes): > 60, Frequency (Times/Week): 4, Weekly Exercise (Minutes/Week): 0, Intensity: Moderate  Goals    . Have 3 meals a day          Will try 3 balanced meals per day; or balancing diet Will refer to the diabetes and nutrition center Monday night 6pm; Diabetes Type 2 class;       Fall Risk Fall Risk  02/16/2016 02/11/2015 02/10/2014 11/27/2012  Falls in the past year? No No No No   Depression Screen PHQ 2/9 Scores 02/16/2016 02/11/2015 02/10/2014 11/27/2012  PHQ - 2 Score 0 0 0 0     Cognitive Testing MMSE - Mini Mental State Exam 02/16/2016  Not completed: (No Data)   Ad8 score 0   Immunization History  Administered Date(s) Administered  . Influenza Split 02/28/2012, 02/15/2013, 01/27/2014  . Influenza Whole  02/13/2008, 04/07/2009  . Influenza-Unspecified 02/09/2015, 02/04/2016  . Pneumococcal Conjugate-13 08/11/2014  . Pneumococcal Polysaccharide-23 05/31/2011  . Tdap 03/16/2015   Screening Tests Health Maintenance  Topic Date Due  . Hepatitis C Screening  1945-07-30  . ZOSTAVAX  01/05/2005  . FOOT EXAM  02/11/2015  . INFLUENZA VACCINE  12/14/2015  . OPHTHALMOLOGY EXAM  06/14/2016 (Originally 05/30/2015)  . DEXA SCAN  08/12/2016 (Originally 01/05/2010)  . HEMOGLOBIN A1C  06/14/2016  . MAMMOGRAM  04/14/2017  . COLONOSCOPY  09/03/2018  . TETANUS/TDAP  03/15/2025  . PNA vac Low Risk Adult  Completed      Plan:     Will ask the GYN at the next visit if they can send a copy of your DEXA  Check out  online nutrition programs as GumSearch.nl and [email or web address].[email or web address]; fit11m;  Educated regarding coverage for Shingles vaccination  Will fup with Dr. JRonnald Rampfor further questions regarding numbness and pain in ball of right foot;   High does flu vaccine today    During the course of the visit the patient was educated and counseled about the following appropriate screening and preventive services:   Vaccines to include Pneumoccal, Influenza, Hepatitis B, Td, Zostavax, HCV  Electrocardiogram  Cardiovascular Disease   Colorectal cancer screening Due 2020  Bone density screening/ Will try to get bone density screening from GYN in March  Diabetes screening/ educated on consistent food intake; Given information regarding online tracking of intake .   Glaucoma screening/ Jan 2017   Mammography/annual   Nutrition counseling / educated  Informed of the support group for Type 2 DM on Monday 6pm;  Can get more info at the Diabetes and Nutrition center.  Will check on trulicity  Patient Instructions (the written plan) was given to the patient.   HASNKN,LZJQB RN  02/16/2016   Medical screening examination/treatment/procedure(s) were performed by non-physician practitioner and as  supervising physician I was immediately available for consultation/collaboration.  I agree with above. Scarlette Calico, MD

## 2016-02-16 ENCOUNTER — Ambulatory Visit (INDEPENDENT_AMBULATORY_CARE_PROVIDER_SITE_OTHER): Payer: Medicare Other

## 2016-02-16 VITALS — BP 100/70 | Ht 65.0 in | Wt 181.5 lb

## 2016-02-16 DIAGNOSIS — E1121 Type 2 diabetes mellitus with diabetic nephropathy: Secondary | ICD-10-CM | POA: Diagnosis not present

## 2016-02-16 DIAGNOSIS — Z0000 Encounter for general adult medical examination without abnormal findings: Secondary | ICD-10-CM | POA: Diagnosis not present

## 2016-02-16 NOTE — Patient Instructions (Addendum)
[name] , Thank you for taking time to come for your Medicare Wellness Visit. I appreciate your ongoing commitment to your health goals. Please review the following plan we discussed and let me know if I can assist you in the future.   Will ask the GYN at the next visit if they can send a copy of your DEXA   Check out  online nutrition programs as GumSearch.nl and http://vang.com/; fit76m; Look for foods with "whole" wheat; bran; oatmeal etc Shot at the farmer's markets in season for fresher choices  Watch for "hydrogenated" on the label of oils which are trans-fats.  Watch for "high fructose corn syrup" in snacks, yogurt or ketchup  Meats have less marbling; bright colored fruits and vegetables;  Canned; dump out liquid and wash vegetables. Be mindful of what we are eating  Portion control is essential to a health weight! Sit down; take a break and enjoy your meal; take smaller bites; put the fork down between bites;  It takes 20 minutes to get full; so check in with your fullness cues and stop eating when you start to fill full   Shingles:  Educated to check with insurance regarding coverage of Shingles vaccination on Part D or Part B and may have lower co-pay if provided on the Part D side   Can make a fup apt to see Dr. 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 Have 3 meals a day          Will try 3 balanced meals per day; or balancing diet Will [facility] 6pm; Diabetes Type 2 class;        This is a list of the screening recommended for you and due dates:  Health Maintenance  Topic Date Due  .  Hepatitis C: One time screening is recommended by Center for Disease Control  (CDC) for  adults born from [birth year]through 1966.   01946/07/30 . Shingles Vaccine  01/05/2005  . DEXA scan (bone density measurement)  01/05/2010  . Complete foot exam   02/11/2015  . Eye exam for  diabetics  05/30/2015  . Flu Shot  12/14/2015  . Hemoglobin A1C  06/14/2016  . Mammogram  04/14/2017  . Colon Cancer Screening  09/03/2018  . Tetanus Vaccine  03/15/2025  . Pneumonia vaccines  Completed      Diabetes and Foot Care Diabetes may cause you to have problems because of poor blood supply (circulation) to your feet and legs. This may cause the skin on your feet to become thinner, break easier, and heal more slowly. Your skin may become dry, and the skin may peel and crack. You may also have nerve damage in your legs and feet causing decreased feeling in them. You may not notice minor injuries to your feet that could lead to infections or more serious problems. Taking care of your feet is one of the most important things you can do for yourself.  HOME CARE INSTRUCTIONS  Wear shoes at all times, even in the house. Do not go barefoot. Bare feet are easily injured.  Check your feet daily for blisters, cuts, and redness. If you cannot see the bottom of your feet, use a mirror or ask someone for help.  Wash your feet with warm water (do not use hot water) and mild soap. Then pat your feet and the areas between  your toes until they are completely dry. Do not soak your feet as this can dry your skin.  Apply a moisturizing lotion or petroleum jelly (that does not contain alcohol and is unscented) to the skin on your feet and to dry, brittle toenails. Do not apply lotion between your toes.  Trim your toenails straight across. Do not dig under them or around the cuticle. File the edges of your nails with an emery board or nail file.  Do not cut corns or calluses or try to remove them with medicine.  Wear clean socks or stockings every day. Make sure they are not too tight. Do not wear knee-high stockings since they may decrease blood flow to your legs.  Wear shoes that fit properly and have enough cushioning. To break in new shoes, wear them for just a few hours a day. This prevents you  from injuring your feet. Always look in your shoes before you put them on to be sure there are no objects inside.  Do not cross your legs. This may decrease the blood flow to your feet.  If you find a minor scrape, cut, or break in the skin on your feet, keep it and the skin around it clean and dry. These areas may be cleansed with mild soap and water. Do not cleanse the area with peroxide, alcohol, or iodine.  When you remove an adhesive bandage, be sure not to damage the skin around it.  If you have a wound, look at it several times a day to make sure it is healing.  Do not use heating pads or hot water bottles. They may burn your skin. If you have lost feeling in your feet or legs, you may not know it is happening until it is too late.  Make sure your health care provider performs a complete foot exam at least annually or more often if you have foot problems. Report any cuts, sores, or bruises to your health care provider immediately. SEEK MEDICAL CARE IF:   You have an injury that is not healing.  You have cuts or breaks in the skin.  You have an ingrown nail.  You notice redness on your legs or feet.  You feel burning or tingling in your legs or feet.  You have pain or cramps in your legs and feet.  Your legs or feet are numb.  Your feet always feel cold. SEEK IMMEDIATE MEDICAL CARE IF:   There is increasing redness, swelling, or pain in or around a wound.  There is a red line that goes up your leg.  Pus is coming from a wound.  You develop a fever or as directed by your health care provider.  You notice a bad smell coming from an ulcer or wound.   This information is not intended to replace advice given to you by your health care provider. Make sure you discuss any questions you have with your health care provider.   Document Released: 04/28/2000 Document Revised: 01/01/2013 Document Reviewed: 10/08/2012 Elsevier Interactive Patient Education 2016 [place] in the Home  Falls can cause injuries. They can happen to people of all ages. There are many things you can do to make your home safe and to help prevent falls.  WHAT CAN I DO ON THE OUTSIDE OF MY HOME?  Regularly fix the edges of walkways and driveways and fix any cracks.  Remove anything that might make you trip as you walk through a  door, such as a raised step or threshold.  Trim any bushes or trees on the path to your home.  Use bright outdoor lighting.  Clear any walking paths of anything that might make someone trip, such as rocks or tools.  Regularly check to see if handrails are loose or broken. Make sure that both sides of any steps have handrails.  Any raised decks and porches should have guardrails on the edges.  Have any leaves, snow, or ice cleared regularly.  Use sand or salt on walking paths during winter.  Clean up any spills in your garage right away. This includes oil or grease spills. WHAT CAN I DO IN THE BATHROOM?   Use night lights.  Install grab bars by the toilet and in the tub and shower. Do not use towel bars as grab bars.  Use non-skid mats or decals in the tub or shower.  If you need to sit down in the shower, use a plastic, non-slip stool.  Keep the floor dry. Clean up any water that spills on the floor as soon as it happens.  Remove soap buildup in the tub or shower regularly.  Attach bath mats securely with double-sided non-slip rug tape.  Do not have throw rugs and other things on the floor that can make you trip. WHAT CAN I DO IN THE BEDROOM?  Use night lights.  Make sure that you have a light by your bed that is easy to reach.  Do not use any sheets or blankets that are too big for your bed. They should not hang down onto the floor.  Have a firm chair that has side arms. You can use this for support while you get dressed.  Do not have throw rugs and other things on the floor that can make you trip. WHAT CAN  I DO IN THE KITCHEN?  Clean up any spills right away.  Avoid walking on wet floors.  Keep items that you use a lot in easy-to-reach places.  If you need to reach something above you, use a strong step stool that has a grab bar.  Keep electrical cords out of the way.  Do not use floor polish or wax that makes floors slippery. If you must use wax, use non-skid floor wax.  Do not have throw rugs and other things on the floor that can make you trip. WHAT CAN I DO WITH MY STAIRS?  Do not leave any items on the stairs.  Make sure that there are handrails on both sides of the stairs and use them. Fix handrails that are broken or loose. Make sure that handrails are as long as the stairways.  Check any carpeting to make sure that it is firmly attached to the stairs. Fix any carpet that is loose or worn.  Avoid having throw rugs at the top or bottom of the stairs. If you do have throw rugs, attach them to the floor with carpet tape.  Make sure that you have a light switch at the top of the stairs and the bottom of the stairs. If you do not have them, ask someone to add them for you. WHAT ELSE CAN I DO TO HELP PREVENT FALLS?  Wear shoes that:  Do not have high heels.  Have rubber bottoms.  Are comfortable and fit you well.  Are closed at the toe. Do not wear sandals.  If you use a stepladder:  Make sure that it is fully opened. Do not climb a closed  stepladder.  Make sure that both sides of the stepladder are locked into place.  Ask someone to hold it for you, if possible.  Clearly mark and make sure that you can see:  Any grab bars or handrails.  First and last steps.  Where the edge of each step is.  Use tools that help you move around (mobility aids) if they are needed. These include:  Canes.  Walkers.  Scooters.  Crutches.  Turn on the lights when you go into a dark area. Replace any light bulbs as soon as they burn out.  Set up your furniture so you have a  clear path. Avoid moving your furniture around.  If any of your floors are uneven, fix them.  If there are any pets around you, be aware of where they are.  Review your medicines with your doctor. Some medicines can make you feel dizzy. This can increase your chance of falling. Ask your doctor what other things that you can do to help prevent falls.   This information is not intended to replace advice given to you by your health care provider. Make sure you discuss any questions you have with your health care provider.   Document Released: 02/25/2009 Document Revised: 09/15/2014 Document Reviewed: 06/05/2014 Elsevier Interactive Patient Education 2016 [place] Maintenance, Female Adopting a healthy lifestyle and getting preventive care can go a long way to promote health and wellness. Talk with your health care provider about what schedule of regular examinations is right for you. This is a good chance for you to check in with your provider about disease prevention and staying healthy. In between checkups, there are plenty of things you can do on your own. Experts have done a lot of research about which lifestyle changes and preventive measures are most likely to keep you healthy. Ask your health care provider for more information. WEIGHT AND DIET  Eat a healthy diet  Be sure to include plenty of vegetables, fruits, low-fat dairy products, and lean protein.  Do not eat a lot of foods high in solid fats, added sugars, or salt.  Get regular exercise. This is one of the most important things you can do for your health.  Most adults should exercise for at least 150 minutes each week. The exercise should increase your heart rate and make you sweat (moderate-intensity exercise).  Most adults should also do strengthening exercises at least twice a week. This is in addition to the moderate-intensity exercise.  Maintain a healthy weight  Body mass index (BMI) is a measurement  that can be used to identify possible weight problems. It estimates body fat based on height and weight. Your health care provider can help determine your BMI and help you achieve or maintain a healthy weight.  For females 59 years of age and older:   A BMI below 18.5 is considered underweight.  A BMI of 18.5 to 24.9 is normal.  A BMI of 25 to 29.9 is considered overweight.  A BMI of 30 and above is considered obese.  Watch levels of cholesterol and blood lipids  You should start having your blood tested for lipids and cholesterol at 71 years of age, then have this test every 5 years.  You may need to have your cholesterol levels checked more often if:  Your lipid or cholesterol levels are high.  You are older than 71 years of age.  You are at high risk for heart disease.  CANCER SCREENING  Lung Cancer  Lung cancer screening is recommended for adults 22-6 years old who are at high risk for lung cancer because of a history of smoking.  A yearly low-dose CT scan of the lungs is recommended for people who:  Currently smoke.  Have quit within the past 15 years.  Have at least a 30-pack-year history of smoking. A pack year is smoking an average of one pack of cigarettes a day for 1 year.  Yearly screening should continue until it has been 15 years since you quit.  Yearly screening should stop if you develop a health problem that would prevent you from having lung cancer treatment.  Breast Cancer  Practice breast self-awareness. This means understanding how your breasts normally appear and feel.  It also means doing regular breast self-exams. Let your health care provider know about any changes, no matter how small.  If you are in your 20s or 30s, you should have a clinical breast exam (CBE) by a health care provider every 1-3 years as part of a regular health exam.  If you are 68 or older, have a CBE every year. Also consider having a breast X-ray (mammogram) every  year.  If you have a family history of breast cancer, talk to your health care provider about genetic screening.  If you are at high risk for breast cancer, talk to your health care provider about having an MRI and a mammogram every year.  Breast cancer gene (BRCA) assessment is recommended for women who have family members with BRCA-related cancers. BRCA-related cancers include:  Breast.  Ovarian.  Tubal.  Peritoneal cancers.  Results of the assessment will determine the need for genetic counseling and BRCA1 and BRCA2 testing. Cervical Cancer Your health care provider may recommend that you be screened regularly for cancer of the pelvic organs (ovaries, uterus, and vagina). This screening involves a pelvic examination, including checking for microscopic changes to the surface of your cervix (Pap test). You may be encouraged to have this screening done every 3 years, beginning at age 61.  For women ages 14-65, health care providers may recommend pelvic exams and Pap testing every 3 years, or they may recommend the Pap and pelvic exam, combined with testing for human papilloma virus (HPV), every 5 years. Some types of HPV increase your risk of cervical cancer. Testing for HPV may also be done on women of any age with unclear Pap test results.  Other health care providers may not recommend any screening for nonpregnant women who are considered low risk for pelvic cancer and who do not have symptoms. Ask your health care provider if a screening pelvic exam is right for you.  If you have had past treatment for cervical cancer or a condition that could lead to cancer, you need Pap tests and screening for cancer for at least 20 years after your treatment. If Pap tests have been discontinued, your risk factors (such as having a new sexual partner) need to be reassessed to determine if screening should resume. Some women have medical problems that increase the chance of getting cervical cancer. In  these cases, your health care provider may recommend more frequent screening and Pap tests. Colorectal Cancer  This type of cancer can be detected and often prevented.  Routine colorectal cancer screening usually begins at 71 years of age and continues through 71 years of age.  Your health care provider may recommend screening at an earlier age if you have risk factors for colon cancer.  Your health care provider may also recommend using home test kits to check for hidden blood in the stool.  A small camera at the end of a tube can be used to examine your colon directly (sigmoidoscopy or colonoscopy). This is done to check for the earliest forms of colorectal cancer.  Routine screening usually begins at age 45.  Direct examination of the colon should be repeated every 5-10 years through 71 years of age. However, you may need to be screened more often if early forms of precancerous polyps or small growths are found. Skin Cancer  Check your skin from head to toe regularly.  Tell your health care provider about any new moles or changes in moles, especially if there is a change in a mole's shape or color.  Also tell your health care provider if you have a mole that is larger than the size of a pencil eraser.  Always use sunscreen. Apply sunscreen liberally and repeatedly throughout the day.  Protect yourself by wearing long sleeves, pants, a wide-brimmed hat, and sunglasses whenever you are outside. HEART DISEASE, DIABETES, AND HIGH BLOOD PRESSURE   High blood pressure causes heart disease and increases the risk of stroke. High blood pressure is more likely to develop in:  People who have blood pressure in the high end of the normal range (130-139/85-89 mm Hg).  People who are overweight or obese.  People who are African American.  If you are 50-60 years of age, have your blood pressure checked every 3-5 years. If you are 24 years of age or older, have your blood pressure checked  every year. You should have your blood pressure measured twice--once when you are at a hospital or clinic, and once when you are not at a hospital or clinic. Record the average of the two measurements. To check your blood pressure when you are not at a hospital or clinic, you can use:  An automated blood pressure machine at a pharmacy.  A home blood pressure monitor.  If you are between 50 years and 48 years old, ask your health care provider if you should take aspirin to prevent strokes.  Have regular diabetes screenings. This involves taking a blood sample to check your fasting blood sugar level.  If you are at a normal weight and have a low risk for diabetes, have this test once every three years after 71 years of age.  If you are overweight and have a high risk for diabetes, consider being tested at a younger age or more often. PREVENTING INFECTION  Hepatitis B  If you have a higher risk for hepatitis B, you should be screened for this virus. You are considered at high risk for hepatitis B if:  You were born in a country where hepatitis B is common. Ask your health care provider which countries are considered high risk.  Your parents were born in a high-risk country, and you have not been immunized against hepatitis B (hepatitis B vaccine).  You have HIV or AIDS.  You use needles to inject street drugs.  You live with someone who has hepatitis B.  You have had sex with someone who has hepatitis B.  You get hemodialysis treatment.  You take certain medicines for conditions, including cancer, organ transplantation, and autoimmune conditions. Hepatitis C  Blood testing is recommended for:  Everyone born from [birth year] through 1966.  Anyone with known risk factors for hepatitis C. Sexually transmitted infections (STIs)  You should be screened for sexually transmitted  infections (STIs) including gonorrhea and chlamydia if:  You are sexually active and are younger than 71 years  of age.  You are older than 71 years of age and your health care provider tells you that you are at risk for this type of infection.  Your sexual activity has changed since you were last screened and you are at an increased risk for chlamydia or gonorrhea. Ask your health care provider if you are at risk.  If you do not have HIV, but are at risk, it may be recommended that you take a prescription medicine daily to prevent HIV infection. This is called pre-exposure prophylaxis (PrEP). You are considered at risk if:  You are sexually active and do not regularly use condoms or know the HIV status of your partner(s).  You take drugs by injection.  You are sexually active with a partner who has HIV. Talk with your health care provider about whether you are at high risk of being infected with HIV. If you choose to begin PrEP, you should first be tested for HIV. You should then be tested every 3 months for as long as you are taking PrEP.  PREGNANCY   If you are premenopausal and you may become pregnant, ask your health care provider about preconception counseling.  If you may become pregnant, take 400 to 800 micrograms (mcg) of folic acid every day.  If you want to prevent pregnancy, talk to your health care provider about birth control (contraception). OSTEOPOROSIS AND MENOPAUSE   Osteoporosis is a disease in which the bones lose minerals and strength with aging. This can result in serious bone fractures. Your risk for osteoporosis can be identified using a bone density scan.  If you are 4 years of age or older, or if you are at risk for osteoporosis and fractures, ask your health care provider if you should be screened.  Ask your health care provider whether you should take a calcium or vitamin D supplement to lower your risk for osteoporosis.  Menopause may have certain physical symptoms and risks.  Hormone replacement therapy may reduce some of these symptoms and risks. Talk to your  health care provider about whether hormone replacement therapy is right for you.  HOME CARE INSTRUCTIONS   Schedule regular health, dental, and eye exams.  Stay current with your immunizations.   Do not use any tobacco products including cigarettes, chewing tobacco, or electronic cigarettes.  If you are pregnant, do not drink alcohol.  If you are breastfeeding, limit how much and how often you drink alcohol.  Limit alcohol intake to no more than 1 drink per day for nonpregnant women. One drink equals 12 ounces of beer, 5 ounces of wine, or 1 ounces of hard liquor.  Do not use street drugs.  Do not share needles.  Ask your health care provider for help if you need support or information about quitting drugs.  Tell your health care provider if you often feel depressed.  Tell your health care provider if you have ever been abused or do not feel safe at home.   This information is not intended to replace advice given to you by your health care provider. Make sure you discuss any questions you have with your health care provider.   Document Released: 11/14/2010 Document Revised: 05/22/2014 Document Reviewed: 04/02/2013 Elsevier Interactive Patient Education [employer].

## 2016-02-23 ENCOUNTER — Telehealth: Payer: Self-pay

## 2016-02-23 NOTE — Telephone Encounter (Signed)
fup from [facility]   Call to Katie [name] to let her know that the drug rep could not help her with the Trulicity as they have limited meds.  Referred back to Dr. Elyse Hsu for recommendation.

## 2016-03-14 ENCOUNTER — Encounter: Payer: Self-pay | Admitting: Dietician

## 2016-03-14 ENCOUNTER — Encounter: Payer: Medicare Other | Attending: Internal Medicine | Admitting: Dietician

## 2016-03-14 DIAGNOSIS — E1121 Type 2 diabetes mellitus with diabetic nephropathy: Secondary | ICD-10-CM

## 2016-03-14 NOTE — Progress Notes (Signed)

## 2016-03-21 ENCOUNTER — Encounter: Payer: Medicare Other | Attending: Internal Medicine | Admitting: Dietician

## 2016-03-21 DIAGNOSIS — E1121 Type 2 diabetes mellitus with diabetic nephropathy: Secondary | ICD-10-CM

## 2016-03-21 NOTE — Progress Notes (Signed)

## 2016-03-28 ENCOUNTER — Encounter: Payer: Self-pay | Admitting: Skilled Nursing Facility1

## 2016-03-28 ENCOUNTER — Encounter: Payer: Medicare Other | Admitting: Skilled Nursing Facility1

## 2016-03-28 DIAGNOSIS — E1121 Type 2 diabetes mellitus with diabetic nephropathy: Secondary | ICD-10-CM | POA: Diagnosis not present

## 2016-03-28 NOTE — Progress Notes (Signed)
Patient was seen on 03/28/2016 for the third of a series of three diabetes self-management courses at [facility]. The following learning objectives were met by the patient during this class:  . State the amount of activity recommended for healthy living . Describe activities suitable for individual needs . Identify ways to regularly incorporate activity into daily life . Identify barriers to activity and ways to over come these barriers  Identify diabetes medications being personally used and their primary action for lowering glucose and possible side effects . Describe role of stress on blood glucose and develop strategies to address psychosocial issues . Identify diabetes complications and ways to prevent them  Explain how to manage diabetes during illness . Evaluate success in meeting personal goal . Establish 2-3 goals that they will plan to diligently work on until they return for the  17-monthfollow-up visit  Goals:   I will count my carb choices at most meals and snacks  I will test my glucose at least 2 times a day, 7 days a week  Your patient has identified their diabetes self-care support plan as  [facility]  Attend Monthly Diabetes Support Group as needed or make a future follow up appointment

## 2016-04-10 NOTE — Progress Notes (Signed)
Eye exam notes received, abstracted and sent to scan.

## 2016-04-17 LAB — HM MAMMOGRAPHY

## 2016-04-20 ENCOUNTER — Encounter: Payer: Self-pay | Admitting: Internal Medicine

## 2016-04-20 NOTE — Progress Notes (Signed)
Abstracted and sent to scan  

## 2016-05-24 ENCOUNTER — Ambulatory Visit: Payer: Medicare Other | Admitting: Internal Medicine

## 2016-05-30 ENCOUNTER — Ambulatory Visit (INDEPENDENT_AMBULATORY_CARE_PROVIDER_SITE_OTHER): Payer: Medicare Other | Admitting: Internal Medicine

## 2016-05-30 VITALS — BP 124/74 | HR 62 | Temp 97.5°F | Ht 65.0 in | Wt 184.1 lb

## 2016-05-30 DIAGNOSIS — E538 Deficiency of other specified B group vitamins: Secondary | ICD-10-CM

## 2016-05-30 DIAGNOSIS — G5711 Meralgia paresthetica, right lower limb: Secondary | ICD-10-CM | POA: Insufficient documentation

## 2016-05-30 DIAGNOSIS — F411 Generalized anxiety disorder: Secondary | ICD-10-CM

## 2016-05-30 DIAGNOSIS — E038 Other specified hypothyroidism: Secondary | ICD-10-CM

## 2016-05-30 DIAGNOSIS — I10 Essential (primary) hypertension: Secondary | ICD-10-CM

## 2016-05-30 DIAGNOSIS — R739 Hyperglycemia, unspecified: Secondary | ICD-10-CM

## 2016-05-30 DIAGNOSIS — R200 Anesthesia of skin: Secondary | ICD-10-CM

## 2016-05-30 MED ORDER — AMLODIPINE BESYLATE 5 MG PO TABS: 5.0000 mg | ORAL_TABLET | Freq: Every day | ORAL | 3 refills | Status: DC

## 2016-05-30 MED ORDER — ALPRAZOLAM 0.5 MG PO TABS: ORAL_TABLET | ORAL | 3 refills | Status: DC

## 2016-05-30 MED ORDER — LISINOPRIL 10 MG PO TABS: 10.0000 mg | ORAL_TABLET | Freq: Every day | ORAL | 3 refills | Status: DC

## 2016-05-30 MED ORDER — FUROSEMIDE 20 MG PO TABS: 20.0000 mg | ORAL_TABLET | Freq: Every day | ORAL | 1 refills | Status: DC

## 2016-05-30 NOTE — Progress Notes (Signed)
Pre visit review using our clinic review tool, if applicable. No additional management support is needed unless otherwise documented below in the visit note. 

## 2016-05-30 NOTE — Progress Notes (Signed)
Subjective:  Patient ID: Katie [name], female    DOB: 1946/03/04  Age: 71 y.o. MRN: [id number]  CC: Hypertension and Hypothyroidism   HPI Cecil Bixby presents for Follow-up on hypertension and hypothyroidism. She refuses to do any lab work today because she said she just recently saw her endocrinologist and he did her lab work. I do not have copies of those labs today. She complains of mild weight gain. She tells me her blood sugars pressures have been well controlled. She complains of a two-month history of strange sensations over the right lateral aspect of her thigh. She describes it is an intermittent burning and numbness. She has a remote history of low back pain but has not recently had any back pain. She tells me the numbness extends into the right foot.  Outpatient Medications Prior to Visit  Medication Sig Dispense Refill  . ACCU-CHEK AVIVA PLUS test strip     . AIMSCO INSULIN SYR ULTRA THIN 31G X 5/16" 0.3 ML MISC     . aspirin 81 MG EC tablet Take 81 mg by mouth daily.      . Blood Glucose Monitoring Suppl (ACCU-CHEK AVIVA PLUS) W/DEVICE KIT     . cyanocobalamin (,VITAMIN B-12,) 1000 MCG/ML injection 1000 mcg subq once a month 30 mL 1  . ergocalciferol (VITAMIN D2) 50000 UNITS capsule Take 50,000 Units by mouth once a week.      Elmore Guise Devices (SIMPLE DIAGNOSTICS LANCING DEV) MISC     . levothyroxine (SYNTHROID, LEVOTHROID) 88 MCG tablet Take 88 mcg by mouth daily.      . rosuvastatin (CRESTOR) 20 MG tablet Take 1/2 daily     . vitamin E 100 UNIT capsule Take 400 Units by mouth daily.    Marland Kitchen ALPRAZolam (XANAX) 0.5 MG tablet TAKE ONE TABLET BY MOUTH THREE TIMES DAILY AS NEEDED FOR ANXIETY OR SLEEP 90 tablet 3  . amLODipine (NORVASC) 5 MG tablet Take 1 tablet (5 mg total) by mouth daily. Yearly physical due in Sept must see Md for refills 30 tablet 0  . furosemide (LASIX) 20 MG tablet Take 1 tablet (20 mg total) by mouth daily. 90 tablet 1  . lisinopril (PRINIVIL,ZESTRIL) 10 MG tablet  Take 1 tablet (10 mg total) by mouth daily. Yearly physical due in sept must see Md for refills 90 tablet 0  . tiZANidine (ZANAFLEX) 4 MG tablet Take 1 tablet (4 mg total) by mouth 3 (three) times daily. 90 tablet 0  . traMADol (ULTRAM) 50 MG tablet Take 1 tablet (50 mg total) by mouth every 8 (eight) hours as needed. 90 tablet 5  . TRULICITY 1.5 LX/7.2IO SOPN      No facility-administered medications prior to visit.     ROS Review of Systems  Constitutional: Positive for unexpected weight change. Negative for diaphoresis and fatigue.  Eyes: Negative for visual disturbance.  Respiratory: Negative for cough, chest tightness and shortness of breath.   Cardiovascular: Negative for chest pain, palpitations and leg swelling.  Gastrointestinal: Negative for abdominal pain, constipation, diarrhea, nausea and vomiting.  Endocrine: Negative.  Negative for cold intolerance and heat intolerance.  Genitourinary: Negative.   Musculoskeletal: Negative.  Negative for back pain, myalgias and neck pain.  Skin: Negative.   Allergic/Immunologic: Negative.   Neurological: Negative.   Hematological: Negative.  Negative for adenopathy. Does not bruise/bleed easily.  Psychiatric/Behavioral: Negative for agitation, behavioral problems, decreased concentration, dysphoric mood and sleep disturbance. The patient is nervous/anxious.     Objective:  BP  124/74 (BP Location: Left Arm, Patient Position: Sitting, Cuff Size: Normal)   Pulse 62   Temp 97.5 F (36.4 C) (Oral)   Ht _0  (1.651 m)   Wt 184 lb 1.9 oz (83.5 kg)   SpO2 93%   BMI 30.64 kg/m   BP Readings from Last 3 Encounters:  05/30/16 124/74  02/16/16 100/70  12/13/15 126/72    Wt Readings from Last 3 Encounters:  05/30/16 184 lb 1.9 oz (83.5 kg)  03/14/16 185 lb (83.9 kg)  02/16/16 181 lb 8 oz (82.3 kg)    Physical Exam  Constitutional: She is oriented to person, place, and time. No distress.  HENT:  Mouth/Throat: Oropharynx is clear  and moist. No oropharyngeal exudate.  Eyes: Conjunctivae are normal. Right eye exhibits no discharge. Left eye exhibits no discharge. No scleral icterus.  Neck: Normal range of motion. Neck supple. No JVD present. No tracheal deviation present. No thyromegaly present.  Cardiovascular: Normal rate, regular rhythm, normal heart sounds and intact distal pulses.  Exam reveals no gallop and no friction rub.   No murmur heard. Pulmonary/Chest: Effort normal and breath sounds normal. No stridor. No respiratory distress. She has no wheezes. She has no rales. She exhibits no tenderness.  Abdominal: Soft. Bowel sounds are normal. She exhibits no distension and no mass. There is no tenderness. There is no rebound and no guarding.  Musculoskeletal: Normal range of motion. She exhibits no edema, tenderness or deformity.  Lymphadenopathy:    She has no cervical adenopathy.  Neurological: She is oriented to person, place, and time. She has normal strength. She displays no atrophy, no tremor and normal reflexes. No cranial nerve deficit or sensory deficit. She exhibits normal muscle tone. She displays a negative Romberg sign. She displays no seizure activity. Coordination and gait normal.  Reflex Scores:      Tricep reflexes are 1+ on the right side and 1+ on the left side.      Bicep reflexes are 1+ on the right side and 1+ on the left side.      Brachioradialis reflexes are 1+ on the right side and 1+ on the left side.      Patellar reflexes are 1+ on the right side and 1+ on the left side.      Achilles reflexes are 0 on the right side and 0 on the left side. Neg SLR in BLE  Skin: Skin is warm and dry. No rash noted. She is not diaphoretic. No erythema. No pallor.  Vitals reviewed.   Lab Results  Component Value Date   WBC 4.6 12/13/2015   HGB 13.3 12/13/2015   HCT 38.9 12/13/2015   PLT 218.0 12/13/2015   GLUCOSE 100 (H) 12/13/2015   CHOL 119 12/13/2015   TRIG 57.0 12/13/2015   HDL 78.40 12/13/2015    LDLDIRECT 105.9 07/04/2007   LDLCALC 29 12/13/2015   ALT 32 07/04/2007   AST 32 07/04/2007   NA 142 12/13/2015   K 4.9 12/13/2015   CL 104 12/13/2015   CREATININE 1.22 (H) 12/13/2015   BUN 13 12/13/2015   CO2 33 (H) 12/13/2015   TSH 0.89 12/13/2015   HGBA1C 5.3 12/13/2015    No results found.  Assessment & Plan:   Damika was seen today for hypertension and hypothyroidism.  Diagnoses and all orders for this visit:  Essential hypertension- her blood pressure is adequately well controlled, will continue the current regimen, no labs ordered today at her request. -  amLODipine (NORVASC) 5 MG tablet; Take 1 tablet (5 mg total) by mouth daily. -     lisinopril (PRINIVIL,ZESTRIL) 10 MG tablet; Take 1 tablet (10 mg total) by mouth daily. Yearly physical due in sept must see Md for refills -     furosemide (LASIX) 20 MG tablet; Take 1 tablet (20 mg total) by mouth daily.  B12 deficiency- she will continue B12 replacement therapy.  Other specified hypothyroidism- this is managed by endocrinology  Hyperglycemia- this is managed by endocrinology  GAD (generalized anxiety disorder) -     ALPRAZolam (XANAX) 0.5 MG tablet; TAKE ONE TABLET BY MOUTH THREE TIMES DAILY AS NEEDED FOR ANXIETY OR SLEEP  Numbness in right leg- if her symptoms were isolated to her right thigh I would treat her for meralgia paresthetica but since the numbness extends into the right foot I am more concerned about something like sciatica, lumbar radiculitis, nerve impingement, or spinal cord lesion. I have therefore ordered a NCS/EMG of the right lower extremity to better identify the cause for her symptoms. -     Ambulatory referral to Neurology   I have discontinued Ms. Jupin's tiZANidine, traMADol, and TRULICITY. I have also changed her amLODipine. Additionally, I am having her maintain her rosuvastatin, aspirin, levothyroxine, ergocalciferol, vitamin E, ACCU-CHEK AVIVA PLUS, ACCU-CHEK AVIVA PLUS, cyanocobalamin,  AIMSCO INSULIN SYR ULTRA THIN, SIMPLE DIAGNOSTICS LANCING DEV, ALPRAZolam, lisinopril, and furosemide.  Meds ordered this encounter  Medications  . ALPRAZolam (XANAX) 0.5 MG tablet    Sig: TAKE ONE TABLET BY MOUTH THREE TIMES DAILY AS NEEDED FOR ANXIETY OR SLEEP    Dispense:  90 tablet    Refill:  3  . amLODipine (NORVASC) 5 MG tablet    Sig: Take 1 tablet (5 mg total) by mouth daily.    Dispense:  90 tablet    Refill:  3  . lisinopril (PRINIVIL,ZESTRIL) 10 MG tablet    Sig: Take 1 tablet (10 mg total) by mouth daily. Yearly physical due in sept must see Md for refills    Dispense:  90 tablet    Refill:  3  . furosemide (LASIX) 20 MG tablet    Sig: Take 1 tablet (20 mg total) by mouth daily.    Dispense:  90 tablet    Refill:  1     Follow-up: Return in about 4 months (around 09/27/2016).  Scarlette Calico, MD

## 2016-05-30 NOTE — Patient Instructions (Signed)
Hypertension Hypertension, commonly called high blood pressure, is when the force of blood pumping through your arteries is too strong. Your arteries are the blood vessels that carry blood from your heart throughout your body. A blood pressure reading consists of a higher number over a lower number, such as 110/72. The higher number (systolic) is the pressure inside your arteries when your heart pumps. The lower number (diastolic) is the pressure inside your arteries when your heart relaxes. Ideally you want your blood pressure below 120/80. Hypertension forces your heart to work harder to pump blood. Your arteries may become narrow or stiff. Having untreated or uncontrolled hypertension can cause heart attack, stroke, kidney disease, and other problems. What increases the risk? Some risk factors for high blood pressure are controllable. Others are not. Risk factors you cannot control include:  Race. You may be at higher risk if you are African American.  Age. Risk increases with age.  Gender. Men are at higher risk than women before age 45 years. After age 65, women are at higher risk than men. Risk factors you can control include:  Not getting enough exercise or physical activity.  Being overweight.  Getting too much fat, sugar, calories, or salt in your diet.  Drinking too much alcohol. What are the signs or symptoms? Hypertension does not usually cause signs or symptoms. Extremely high blood pressure (hypertensive crisis) may cause headache, anxiety, shortness of breath, and nosebleed. How is this diagnosed? To check if you have hypertension, your health care provider will measure your blood pressure while you are seated, with your arm held at the level of your heart. It should be measured at least twice using the same arm. Certain conditions can cause a difference in blood pressure between your right and left arms. A blood pressure reading that is higher than normal on one occasion does  not mean that you need treatment. If it is not clear whether you have high blood pressure, you may be asked to return on a different day to have your blood pressure checked again. Or, you may be asked to monitor your blood pressure at home for 1 or more weeks. How is this treated? Treating high blood pressure includes making lifestyle changes and possibly taking medicine. Living a healthy lifestyle can help lower high blood pressure. You may need to change some of your habits. Lifestyle changes may include:  Following the DASH diet. This diet is high in fruits, vegetables, and whole grains. It is low in salt, red meat, and added sugars.  Keep your sodium intake below 2,300 mg per day.  Getting at least 30-45 minutes of aerobic exercise at least 4 times per week.  Losing weight if necessary.  Not smoking.  Limiting alcoholic beverages.  Learning ways to reduce stress. Your health care provider may prescribe medicine if lifestyle changes are not enough to get your blood pressure under control, and if one of the following is true:  You are 18-59 years of age and your systolic blood pressure is above 140.  You are 60 years of age or older, and your systolic blood pressure is above 150.  Your diastolic blood pressure is above 90.  You have diabetes, and your systolic blood pressure is over 140 or your diastolic blood pressure is over 90.  You have kidney disease and your blood pressure is above 140/90.  You have heart disease and your blood pressure is above 140/90. Your personal target blood pressure may vary depending on your medical   conditions, your age, and other factors. Follow these instructions at home:  Have your blood pressure rechecked as directed by your health care provider.  Take medicines only as directed by your health care provider. Follow the directions carefully. Blood pressure medicines must be taken as prescribed. The medicine does not work as well when you skip  doses. Skipping doses also puts you at risk for problems.  Do not smoke.  Monitor your blood pressure at home as directed by your health care provider. Contact a health care provider if:  You think you are having a reaction to medicines taken.  You have recurrent headaches or feel dizzy.  You have swelling in your ankles.  You have trouble with your vision. Get help right away if:  You develop a severe headache or confusion.  You have unusual weakness, numbness, or feel faint.  You have severe chest or abdominal pain.  You vomit repeatedly.  You have trouble breathing. This information is not intended to replace advice given to you by your health care provider. Make sure you discuss any questions you have with your health care provider. Document Released: 05/01/2005 Document Revised: 10/07/2015 Document Reviewed: 02/21/2013 Elsevier Interactive Patient Education  2017 Elsevier Inc.  

## 2016-06-01 ENCOUNTER — Encounter: Payer: Medicare Other | Admitting: Neurology

## 2016-06-01 LAB — BASIC METABOLIC PANEL
BUN: 16 mg/dL (ref 4–21)
Creatinine: 1.3 mg/dL — AB (ref 0.5–1.1)
Glucose: 137 mg/dL
Glucose: 137 mg/dL
Potassium: 5.3 mmol/L (ref 3.4–5.3)
Sodium: 139 mmol/L (ref 137–147)

## 2016-06-01 LAB — LIPID PANEL
Cholesterol: 123 mg/dL (ref 0–200)
LDL Cholesterol: 29 mg/dL
Triglycerides: 112 mg/dL (ref 40–160)

## 2016-06-01 LAB — HEMOGLOBIN A1C: Hemoglobin A1C: 5.7

## 2016-06-01 LAB — TSH: TSH: 1.74 u[IU]/mL (ref 0.41–5.90)

## 2016-06-01 LAB — HEPATIC FUNCTION PANEL
ALT: 15 U/L (ref 7–35)
AST: 23 U/L (ref 13–35)
Alkaline Phosphatase: 55 U/L (ref 25–125)
Bilirubin, Total: 0.9 mg/dL

## 2016-06-05 ENCOUNTER — Other Ambulatory Visit: Payer: Self-pay | Admitting: *Deleted

## 2016-06-05 DIAGNOSIS — R200 Anesthesia of skin: Secondary | ICD-10-CM

## 2016-06-06 ENCOUNTER — Ambulatory Visit (INDEPENDENT_AMBULATORY_CARE_PROVIDER_SITE_OTHER): Payer: Medicare Other | Admitting: Neurology

## 2016-06-06 DIAGNOSIS — R200 Anesthesia of skin: Secondary | ICD-10-CM

## 2016-06-06 NOTE — Procedures (Signed)
[facility]  [place], [place] [address]  [place], [place] [address] Tel: [phone] Fax:  [phone] Test Date:  06/06/2016  Patient: Katie [name] DOB: [birth year] Physician: Narda Amber, DO  Sex: Female Height: 5\' 5"  Ref Phys: Scarlette Calico, M.D.  ID#: [id number] Temp: 33.4C Technician: Jerilynn Mages. Dean   Patient Complaints: This is a 71 year old female referred for evaluation of intermittent right upper thigh numbness and constant numbness over the ball of the foot.    NCV & EMG Findings: Extensive electrodiagnostic testing of the right lower extremity shows:  1. Right sural and superficial peroneal sensory responses are within normal limits. 2. Right peroneal and tibial motor responses are within normal limits. 3. Right tibial H reflex study is within normal limits. 4. There is no evidence of active or chronic motor axonal loss changes affecting any of the tested muscles. Motor unit configuration and recruitment pattern is within normal limits.  Impression: This is a normal study of the right lower extremity.   In particular, there is no evidence of a generalized sensorimotor polyneuropathy or lumbosacral radiculopathy.    ___________________________ Narda Amber, DO    Nerve Conduction Studies Anti Sensory Summary Table   Site NR Peak (ms) Norm Peak (ms) P-T Amp (V) Norm P-T Amp  Right Sup Peroneal Anti Sensory (Ant Lat Mall)  33.4C  12 cm    3.2 <4.6 5.7 >3  Right Sural Anti Sensory (Lat Mall)  33.4C  Calf    3.2 <4.6 6.6 >3   Motor Summary Table   Site NR Onset (ms) Norm Onset (ms) O-P Amp (mV) Norm O-P Amp Site1 Site2 Delta-0 (ms) Dist (cm) Vel (m/s) Norm Vel (m/s)  Right Peroneal Motor (Ext Dig Brev)  33.4C  Ankle    3.3 <6.0 4.7 >2.5 B Fib Ankle 6.5 31.0 48 >40  B Fib    9.8  4.1  Poplt B Fib 1.7 10.0 59 >40  Poplt    11.5  3.9         Right Tibial Motor (Abd Hall Brev)  33.4C  Ankle    3.5 <6.0 4.2 >4 Knee Ankle 7.7 38.0 49 >40  Knee    11.2  2.1           H Reflex Studies   NR H-Lat (ms) Lat Norm (ms) L-R H-Lat (ms) M-Lat (ms) HLat-MLat (ms)  Right Tibial (Gastroc)  33.4C     32.52 <35  4.08 28.44   EMG   Side Muscle Ins Act Fibs Psw Fasc Number Recrt Dur Dur. Amp Amp. Poly Poly. Comment  Right AntTibialis Nml Nml Nml Nml Nml Nml Nml Nml Nml Nml Nml Nml N/A  Right Gastroc Nml Nml Nml Nml Nml Nml Nml Nml Nml Nml Nml Nml N/A  Right Flex Dig Long Nml Nml Nml Nml Nml Nml Nml Nml Nml Nml Nml Nml N/A  Right RectFemoris Nml Nml Nml Nml Nml Nml Nml Nml Nml Nml Nml Nml N/A  Right GluteusMed Nml Nml Nml Nml Nml Nml Nml Nml Nml Nml Nml Nml N/A  Right BicepsFemS Nml Nml Nml Nml Nml Nml Nml Nml Nml Nml Nml Nml N/A      Waveforms:

## 2016-06-07 ENCOUNTER — Telehealth: Payer: Self-pay | Admitting: Internal Medicine

## 2016-06-07 NOTE — Telephone Encounter (Signed)
Patient states she is returning your call

## 2016-06-14 ENCOUNTER — Other Ambulatory Visit: Payer: Self-pay | Admitting: Internal Medicine

## 2016-06-14 DIAGNOSIS — G8929 Other chronic pain: Secondary | ICD-10-CM

## 2016-06-14 DIAGNOSIS — M545 Low back pain: Principal | ICD-10-CM

## 2016-06-14 NOTE — Telephone Encounter (Signed)
Pt stated that she is still having pain that shoots down her legs.

## 2016-06-14 NOTE — Telephone Encounter (Signed)
Referral ordered to get this evaluated

## 2016-06-16 NOTE — Telephone Encounter (Signed)
Pt informed that referral has been sent to sports medicine.  Forwarding to referral - do you all schedule or would [facility]?

## 2016-06-16 NOTE — Telephone Encounter (Signed)
Appointment made

## 2016-06-16 NOTE — Telephone Encounter (Signed)
Thank you :)

## 2016-06-27 ENCOUNTER — Encounter: Payer: Self-pay | Admitting: Internal Medicine

## 2016-06-27 LAB — VITAMIN B12: Vitamin B-12: 415

## 2016-06-27 LAB — VITAMIN D, 1,25 + 25-HYDROXY: Vit D, 25-Hydroxy: 66

## 2016-06-27 NOTE — Progress Notes (Signed)
Results abstracted and sent to scan.  

## 2016-06-28 ENCOUNTER — Encounter: Payer: Self-pay | Admitting: Family Medicine

## 2016-06-28 ENCOUNTER — Ambulatory Visit (INDEPENDENT_AMBULATORY_CARE_PROVIDER_SITE_OTHER): Payer: Medicare Other | Admitting: Family Medicine

## 2016-06-28 DIAGNOSIS — M48062 Spinal stenosis, lumbar region with neurogenic claudication: Secondary | ICD-10-CM | POA: Diagnosis not present

## 2016-06-28 MED ORDER — CYANOCOBALAMIN 1000 MCG/ML IJ SOLN: INTRAMUSCULAR | 1 refills | Status: DC

## 2016-06-28 MED ORDER — GABAPENTIN 100 MG PO CAPS: 200.0000 mg | ORAL_CAPSULE | Freq: Every day | ORAL | 3 refills | Status: DC

## 2016-06-28 NOTE — Assessment & Plan Note (Signed)
I do believe the patient does have more of a spinal stenosis. We discussed with patient at great length. Patient will try conservative therapy including home exercises, topical anti-inflammatories. Did started on gabapentin. Patient has great strength. No signs of significant neurologic compromise with patient having an abnormal EMG. We discussed the possibility of this being also a greater trochanteric bursitis and if worsening symptoms we'll consider injection. Patient will follow-up with me again in 4 weeks.

## 2016-06-28 NOTE — Progress Notes (Signed)
[facility] [address] [place], [place] [address] Phone: [phone] Subjective:    I'm seeing this patient by the request  of:  Scarlette Calico, MD   CC: Low back pain  [id number]  Katie [name] is a 71 y.o. female coming in with complaint of low back pain. Patient states that she has had back pain that seems to be radiating down the lateral aspect of her thighs right greater than left. States that there is an intermittent burning numbness feeling. States that it can go all the way down to her right foot and from time to time. Patient's on primary care provider and was sent for an EMG. EMG was independently visualized by me showing normal nerve conduction. Patient though states that she continues to have the discomfort. States that it seems to be worse after walking long distances. States that the numbness and radiation of pain seems to go away after resting for a while. Gets back after walking for 15-20 minutes.   vitamin D 66 Patient did have an MRI of the lumbar spine November 2014. This was independently visualized by me. Patient was found to have significant spondylosis at L4-L5 remote history of a compression fracture at L2. Moderate spinal stenosis at L4-L5.  Past Medical History:  Diagnosis Date  . Acute venous embolism and thrombosis of unspecified deep vessels of lower extremity 2001   RLE DVT and PE, coumadin x 71mo, now ASSA  . Anxiety state, unspecified   . Degeneration of cervical intervertebral disc   . History of DVT (deep vein thrombosis)   . Iron deficiency anemia secondary to blood loss (chronic)   . Irritable bowel syndrome   . Lumbago   . Other B-complex deficiencies   . Personal history of colonic polyps   . Pure hypercholesterolemia   . Reflux esophagitis   . Type II or unspecified type diabetes mellitus without mention of complication, not stated as uncontrolled   . Unspecified essential hypertension   . Unspecified  hypothyroidism   . Unspecified iridocyclitis   . Unspecified venous (peripheral) insufficiency   . Unspecified vitamin D deficiency    Past Surgical History:  Procedure Laterality Date  . CARPAL TUNNEL RELEASE    . FEMORAL HERNIA REPAIR    . OOPHORECTOMY    . VESICOVAGINAL FISTULA CLOSURE W/ TAH     Social History   Social History  . Marital status: Married    Spouse name: Eevie Lapp  . Number of children: 0  . Years of education: N/A   Occupational History  . record specialist   .  Unemployed   Social History Main Topics  . Smoking status: Never Smoker  . Smokeless tobacco: Never Used  . Alcohol use 0.0 oz/week     Comment: occasional  . Drug use: No  . Sexual activity: Not Asked   Other Topics Concern  . None   Social History Narrative  . None   Allergies  Allergen Reactions  . Metoprolol Succinate     REACTION: ? rash from Toprol ?  Marland Kitchen Penicillins     REACTION: allergic to penicillin   Family History  Problem Relation Age of Onset  . Prostate cancer Father   . Diabetes Mother     retinopathy/blind  . Deep vein thrombosis Mother   . Hypertension Mother     Past medical history, social, surgical and family history all reviewed in electronic medical record.  No pertanent information unless stated regarding to  the chief complaint.   Review of Systems:Review of systems updated and as accurate as of 06/28/16  No headache, visual changes, nausea, vomiting, diarrhea, constipation, dizziness, abdominal pain, skin rash, fevers, chills, night sweats, weight loss, swollen lymph nodes, body aches, joint swelling, muscle aches, chest pain, shortness of breath, mood changes.   Objective  Blood pressure 120/88, pulse 76, height 5\' 5"  (1.651 m), weight 185 lb (83.9 kg). Systems examined below as of 06/28/16   General: No apparent distress alert and oriented x3 mood and affect normal, dressed appropriately.  HEENT: Pupils equal, extraocular movements intact    Respiratory: Patient's speak in full sentences and does not appear short of breath  Cardiovascular: No lower extremity edema, non tender, no erythema  Skin: Warm dry intact with no signs of infection or rash on extremities or on axial skeleton.  Abdomen: Soft nontender  Neuro: Cranial nerves II through XII are intact, neurovascularly intact in all extremities with 2+ DTRs and 2+ pulses.  Lymph: No lymphadenopathy of posterior or anterior cervical chain or axillae bilaterally.  Gait normal with good balance and coordination.  MSK:  Non tender with full range of motion and good stability and symmetric strength and tone of shoulders, elbows, wrist, hip, knee and ankles bilaterally. Changes of multiple joints. Back Exam:  Inspection: Unremarkable  Motion: Flexion 30 deg, Extension 10 deg with worsening symptoms, Side Bending to 45 deg bilaterally,  Rotation to 45 deg bilaterally  SLR laying: Negative  XSLR laying: Negative  Palpable tenderness: Tender to palpation generalized over the paraspinal musculature of the lumbar spine as well as the piriformis bilaterally. Patient is also tender over the greater trochanteric area on the right side only.Marland Kitchen FABER: negative. Sensory change: Gross sensation intact to all lumbar and sacral dermatomes.  Reflexes: 2+ at both patellar tendons, 2+ at achilles tendons, Babinski's downgoing.  Strength at foot  Plantar-flexion: 5/5 Dorsi-flexion: 5/5 Eversion: 5/5 Inversion: 5/5  Leg strength  Quad: 5/5 Hamstring: 5/5 Hip flexor: 5/5 Hip abductors: 4/5 but symmetric Gait unremarkable.   Procedure note 65035; 15 minutes spent for Therapeutic exercises as stated in above notes.  This included exercises focusing on stretching, strengthening, with significant focus on eccentric aspects.  Low back exercises that included:  Pelvic tilt/bracing instruction to focus on control of the pelvic girdle and lower abdominal muscles  Glute strengthening exercises, focusing on  proper firing of the glutes without engaging the low back muscles Proper stretching techniques for maximum relief for the hamstrings, hip flexors, low back and some rotation where tolerated  Proper technique shown and discussed handout in great detail with ATC.  All questions were discussed and answered.     Impression and Recommendations:     This case required medical decision making of moderate complexity.      Note: This dictation was prepared with Dragon dictation along with smaller phrase technology. Any transcriptional errors that result from this process are unintentional.

## 2016-06-28 NOTE — Patient Instructions (Signed)
Good to see you.  Ice 20 minutes 2 times daily. Usually after activity and before bed. Exercises 3 times a week.  Gabapentin 200mg  at night may help with the pain in the leg and the hand tingling as well as the foot.  Avoid extending the back a lot due to spinal stenosis.  OK to try chiropractor.  Over the counter look for tart cherry extract any dose and take at night See me again in 4 weeks and if not better we can try injection in the hip area.

## 2016-07-23 NOTE — Progress Notes (Signed)
[name] [address] [place], [place] [address] Phone: [phone] Subjective:    I'm seeing this patient by the request  of:  Scarlette Calico, MD   CC: Low back pain f/u   [id number]  Katie [name] is a 71 y.o. female coming in with complaint of low back pain. Patient does have known spinal stenosis. Patient was having pain that seemed to be more on the lateral aspect of the hip. Patient was given gabapentin though for more radicular symptoms as well as home exercises, icing protocol and topical anti-inflammatories. Patient states The pain is significantly better. Continues to hunt unfortunate have the weakness. Seems to be more at the foot at this time. States that it seems to be somewhat uncomfortable walking. Patient states that the hip seems to be doing better. No side effects to any of the medications.   vitamin D 66 Patient did have an MRI of the lumbar spine November 2014. This was independently visualized by me. Patient was found to have significant spondylosis at L4-L5 remote history of a compression fracture at L2. Moderate spinal stenosis at L4-L5.  Past Medical History:  Diagnosis Date  . Acute venous embolism and thrombosis of unspecified deep vessels of lower extremity 2001   RLE DVT and PE, coumadin x 19mo, now ASSA  . Anxiety state, unspecified   . Degeneration of cervical intervertebral disc   . History of DVT (deep vein thrombosis)   . Iron deficiency anemia secondary to blood loss (chronic)   . Irritable bowel syndrome   . Lumbago   . Other B-complex deficiencies   . Personal history of colonic polyps   . Pure hypercholesterolemia   . Reflux esophagitis   . Type II or unspecified type diabetes mellitus without mention of complication, not stated as uncontrolled   . Unspecified essential hypertension   . Unspecified hypothyroidism   . Unspecified iridocyclitis   . Unspecified venous (peripheral) insufficiency   . Unspecified  vitamin D deficiency    Past Surgical History:  Procedure Laterality Date  . CARPAL TUNNEL RELEASE    . FEMORAL HERNIA REPAIR    . OOPHORECTOMY    . VESICOVAGINAL FISTULA CLOSURE W/ TAH     Social History   Social History  . Marital status: Married    Spouse name: Shaneece Stockburger  . Number of children: 0  . Years of education: N/A   Occupational History  . record specialist   .  Unemployed   Social History Main Topics  . Smoking status: Never Smoker  . Smokeless tobacco: Never Used  . Alcohol use 0.0 oz/week     Comment: occasional  . Drug use: No  . Sexual activity: Not Asked   Other Topics Concern  . None   Social History Narrative  . None   Allergies  Allergen Reactions  . Metoprolol Succinate     REACTION: ? rash from Toprol ?  Marland Kitchen Penicillins     REACTION: allergic to penicillin  . Sulfamethoxazole-Trimethoprim     Other reaction(s): Other (See Comments) Reaction not listed  . Metformin Nausea Only   Family History  Problem Relation Age of Onset  . Prostate cancer Father   . Diabetes Mother     retinopathy/blind  . Deep vein thrombosis Mother   . Hypertension Mother     Past medical history, social, surgical and family history all reviewed in electronic medical record.  No pertanent information unless stated regarding to the chief complaint.  Review of Systems: No headache, visual changes, nausea, vomiting, diarrhea, constipation, dizziness, abdominal pain, skin rash, fevers, chills, night sweats, weight loss, swollen lymph nodes, body aches, joint swelling,, chest pain, shortness of breath, mood changes.  Positive muscle aches   Objective  Blood pressure 112/88, pulse 85, height 5\' 5"  (1.651 m), weight 188 lb 8 oz (85.5 kg), SpO2 94 %.   Systems examined below as of 07/24/16 General: NAD A&O x3 mood, affect normal  HEENT: Pupils equal, extraocular movements intact no nystagmus Respiratory: not short of breath at rest or with  speaking Cardiovascular: No lower extremity edema, non tender Skin: Warm dry intact with no signs of infection or rash on extremities or on axial skeleton. Abdomen: Soft nontender, no masses Neuro: Cranial nerves  intact, neurovascularly intact in all extremities with 2+ DTRs and 2+ pulses. Lymph: No lymphadenopathy appreciated today  Gait antalgic gait noted with weakness of the right leg.  MSK:  Non tender with full range of motion and good stability and symmetric strength and tone of shoulders, elbows, wrist, hip, knee and ankles bilaterally. Changes of multiple joints.  Back Exam:  Inspection: Unremarkable  Motion: Flexion 30 deg, Extension 10 deg , Side Bending to 35 deg bilaterally,  Rotation to 35 deg bilaterally  SLR laying: Mild positive right side which is new XSLR laying: Negative  Palpable tenderness: Less tenderness than previously been still uncomfortable and the paraspinal musculature FABER: negative. Sensory change: Gross sensation intact to all lumbar and sacral dermatomes.  Reflexes: 2+ at both patellar tendons, 2+ at achilles tendons, Babinski's downgoing.  Strength at foot  4 out of 5 strength of dorsiflexion on the right compared to the contralateral side. This is worse than previous exam.      Impression and Recommendations:     This case required medical decision making of moderate complexity.      Note: This dictation was prepared with Dragon dictation along with smaller phrase technology. Any transcriptional errors that result from this process are unintentional.

## 2016-07-24 ENCOUNTER — Ambulatory Visit (INDEPENDENT_AMBULATORY_CARE_PROVIDER_SITE_OTHER)
Admission: RE | Admit: 2016-07-24 | Discharge: 2016-07-24 | Disposition: A | Discharge status: Home / Self Care | Payer: Medicare Other | Source: Ambulatory Visit | Attending: Family Medicine | Admitting: Family Medicine

## 2016-07-24 ENCOUNTER — Ambulatory Visit (INDEPENDENT_AMBULATORY_CARE_PROVIDER_SITE_OTHER): Payer: Medicare Other | Admitting: Family Medicine

## 2016-07-24 ENCOUNTER — Encounter: Payer: Self-pay | Admitting: Family Medicine

## 2016-07-24 VITALS — BP 112/88 | HR 85 | Ht 65.0 in | Wt 188.5 lb

## 2016-07-24 DIAGNOSIS — M48062 Spinal stenosis, lumbar region with neurogenic claudication: Secondary | ICD-10-CM

## 2016-07-24 DIAGNOSIS — M5441 Lumbago with sciatica, right side: Secondary | ICD-10-CM

## 2016-07-24 DIAGNOSIS — G8929 Other chronic pain: Secondary | ICD-10-CM

## 2016-07-24 MED ORDER — GABAPENTIN 300 MG PO CAPS: 300.0000 mg | ORAL_CAPSULE | Freq: Every day | ORAL | 1 refills | Status: DC

## 2016-07-24 NOTE — Patient Instructions (Signed)
Good to see you  Your thyroid was getting a little higher, we may need to check again but will wait to make adjustment Xray downstairs Gabapentin 300mg  at night Spenco orthotics "total support" online would be great  See me again in 2-3 weeks to make sure the weakness in the leg goes away.  You are doing well keep it up  If it worsens call me and we will GET MRI>

## 2016-07-24 NOTE — Assessment & Plan Note (Signed)
Patient is having more signs of the spinal stenosis. It seems that patient is having significant difficulty more on the right leg with increasing weakness. Patient does have what appears to be more of a weakness in dorsiflexion that could be more of the L5 region. We discussed with patient about the gabapentin increase at this time. We discussed January x-rays to further evaluate from patient's previous MRI in 2014. Discuss if any worsening weakness or any bowel or bladder incontinence patient should seek medical attention immediately. Patient will follow-up again in 2-4 weeks for further evaluation.

## 2016-07-26 LAB — HM DIABETES EYE EXAM

## 2016-08-14 ENCOUNTER — Encounter: Payer: Self-pay | Admitting: Family Medicine

## 2016-08-14 ENCOUNTER — Ambulatory Visit (INDEPENDENT_AMBULATORY_CARE_PROVIDER_SITE_OTHER): Payer: Medicare Other | Admitting: Family Medicine

## 2016-08-14 DIAGNOSIS — M48062 Spinal stenosis, lumbar region with neurogenic claudication: Secondary | ICD-10-CM

## 2016-08-14 NOTE — Patient Instructions (Signed)
Good to see you  Ice is your friend when needed Metatarsal pad in your shoes could help Look at [email or web address] Otherwise [employer] orthotics "total support" online would be great  See me again in 6 weeks and we will discuss getting you off the gabapentin

## 2016-08-14 NOTE — Progress Notes (Signed)
Pre visit review using our clinic review tool, if applicable. No additional management support is needed unless otherwise documented below in the visit note. 

## 2016-08-14 NOTE — Progress Notes (Signed)
[name] [address] [place] [address] [place], [place] [address] Phone: [phone] Subjective:    I'm seeing this patient by the request  of:  Scarlette Calico, MD   CC: Low back pain f/u   [id number]  Katie [name] is a 71 y.o. female coming in with complaint of low back pain. Patient does have known spinal stenosis. Patient was having pain that seemed to be more on the lateral aspect of the hip.  Patient was started on increasing gabapentin 300 mg and states that some residual radicular symptoms she was having is completely resolved at this time. States that the back pain is seeming to be much better as well.   vitamin D 66 Patient did have an MRI of the lumbar spine November 2014. This was independently visualized by me. Patient was found to have significant spondylosis at L4-L5 remote history of a compression fracture at L2. Moderate spinal stenosis at L4-L5.  Past Medical History:  Diagnosis Date  . Acute venous embolism and thrombosis of unspecified deep vessels of lower extremity 2001   RLE DVT and PE, coumadin x 75mo, now ASSA  . Anxiety state, unspecified   . Degeneration of cervical intervertebral disc   . History of DVT (deep vein thrombosis)   . Iron deficiency anemia secondary to blood loss (chronic)   . Irritable bowel syndrome   . Lumbago   . Other B-complex deficiencies   . Personal history of colonic polyps   . Pure hypercholesterolemia   . Reflux esophagitis   . Type II or unspecified type diabetes mellitus without mention of complication, not stated as uncontrolled   . Unspecified essential hypertension   . Unspecified hypothyroidism   . Unspecified iridocyclitis   . Unspecified venous (peripheral) insufficiency   . Unspecified vitamin D deficiency    Past Surgical History:  Procedure Laterality Date  . CARPAL TUNNEL RELEASE    . FEMORAL HERNIA REPAIR    . OOPHORECTOMY    . VESICOVAGINAL FISTULA CLOSURE W/ TAH     Social History    Social History  . Marital status: Married    Spouse name: Janeya Deyo  . Number of children: 0  . Years of education: N/A   Occupational History  . record specialist   .  Unemployed   Social History Main Topics  . Smoking status: Never Smoker  . Smokeless tobacco: Never Used  . Alcohol use 0.0 oz/week     Comment: occasional  . Drug use: No  . Sexual activity: Not Asked   Other Topics Concern  . None   Social History Narrative  . None   Allergies  Allergen Reactions  . Metoprolol Succinate     REACTION: ? rash from Toprol ?  Marland Kitchen Penicillins     REACTION: allergic to penicillin  . Sulfamethoxazole-Trimethoprim     Other reaction(s): Other (See Comments) Reaction not listed  . Metformin Nausea Only   Family History  Problem Relation Age of Onset  . Prostate cancer Father   . Diabetes Mother     retinopathy/blind  . Deep vein thrombosis Mother   . Hypertension Mother     Past medical history, social, surgical and family history all reviewed in electronic medical record.  No pertanent information unless stated regarding to the chief complaint.   Review of Systems: No headache, visual changes, nausea, vomiting, diarrhea, constipation, dizziness, abdominal pain, skin rash, fevers, chills, night sweats, weight loss, swollen lymph nodes, body aches, joint swelling, muscle  aches, chest pain, shortness of breath, mood changes.     Objective  Blood pressure 92/62, pulse 88, height 5\' 6"  (1.676 m), weight 185 lb (83.9 kg).   Systems examined below as of 08/14/16 General: NAD A&O x3 mood, affect normal  HEENT: Pupils equal, extraocular movements intact no nystagmus Respiratory: not short of breath at rest or with speaking Cardiovascular: No lower extremity edema, non tender Skin: Warm dry intact with no signs of infection or rash on extremities or on axial skeleton. Abdomen: Soft nontender, no masses Neuro: Cranial nerves  intact, neurovascularly intact in all  extremities with 2+ DTRs and 2+ pulses. Lymph: No lymphadenopathy appreciated today  Gait normal with good balance and coordination.  MSK:  Non tender with full range of motion and good stability and symmetric strength and tone of shoulders, elbows, wrist, hip, knee and ankles bilaterally. Changes of multiple joints.  Back Exam:  Inspection: Unremarkable  Motion: Flexion 30 deg, Extension 20 deg , Side Bending to 35 deg bilaterally,  Rotation to 35 deg bilaterally  SLR laying: Negative which is an improvement XSLR laying: Negative  Palpable tenderness: Less tenderness than previously been still uncomfortable and the paraspinal musculature FABER: negative. Sensory change: Gross sensation intact to all lumbar and sacral dermatomes.  Reflexes: 2+ at both patellar tendons, 2+ at achilles tendons, Babinski's downgoing.  Strength at foot  Near full strength bilaterally.      Impression and Recommendations:     This case required medical decision making of moderate complexity.      Note: This dictation was prepared with Dragon dictation along with smaller phrase technology. Any transcriptional errors that result from this process are unintentional.

## 2016-08-14 NOTE — Assessment & Plan Note (Signed)
Patient is doing better at this time. No significant changes in management. Patient seems to be less tight and does have a negative straight leg test. Worsening symptoms patient will come back and we'll consider further imaging. Patient will come back and see me in a one more time in 6 weeks.

## 2016-08-15 LAB — HEMOGLOBIN A1C: Hemoglobin A1C: 5.4

## 2016-08-20 ENCOUNTER — Other Ambulatory Visit: Payer: Self-pay | Admitting: Internal Medicine

## 2016-08-20 DIAGNOSIS — I10 Essential (primary) hypertension: Secondary | ICD-10-CM

## 2016-09-18 ENCOUNTER — Telehealth: Payer: Self-pay

## 2016-09-18 ENCOUNTER — Other Ambulatory Visit: Payer: Self-pay | Admitting: Internal Medicine

## 2016-09-18 DIAGNOSIS — F411 Generalized anxiety disorder: Secondary | ICD-10-CM

## 2016-09-18 MED ORDER — ALPRAZOLAM 0.5 MG PO TABS: ORAL_TABLET | ORAL | 1 refills | Status: DC

## 2016-09-18 NOTE — Telephone Encounter (Signed)
rx faxed to [facility].   Message sent to [facility] to dc refills of alprazolam.

## 2016-09-18 NOTE — Telephone Encounter (Signed)
[facility] is rq rf of alprazolam. Last rx sent to [facility]. If okay to send new rx to [facility], let me know and I will dc what is at [facility].

## 2016-09-20 ENCOUNTER — Encounter: Payer: Self-pay | Admitting: Internal Medicine

## 2016-09-20 ENCOUNTER — Ambulatory Visit (INDEPENDENT_AMBULATORY_CARE_PROVIDER_SITE_OTHER): Payer: Medicare Other | Admitting: Internal Medicine

## 2016-09-20 ENCOUNTER — Other Ambulatory Visit (INDEPENDENT_AMBULATORY_CARE_PROVIDER_SITE_OTHER): Payer: Medicare Other

## 2016-09-20 VITALS — BP 118/72 | HR 88 | Temp 98.0°F | Resp 16 | Ht 66.0 in | Wt 188.0 lb

## 2016-09-20 DIAGNOSIS — E669 Obesity, unspecified: Secondary | ICD-10-CM | POA: Diagnosis not present

## 2016-09-20 DIAGNOSIS — R200 Anesthesia of skin: Secondary | ICD-10-CM

## 2016-09-20 DIAGNOSIS — E038 Other specified hypothyroidism: Secondary | ICD-10-CM | POA: Diagnosis not present

## 2016-09-20 LAB — TSH: TSH: 1.08 u[IU]/mL (ref 0.35–4.50)

## 2016-09-20 MED ORDER — LORCASERIN HCL 10 MG PO TABS: 1.0000 | ORAL_TABLET | Freq: Two times a day (BID) | ORAL | 1 refills | Status: DC

## 2016-09-20 MED ORDER — ZOSTER VAC RECOMB ADJUVANTED 50 MCG/0.5ML IM SUSR: 0.5000 mL | Freq: Once | INTRAMUSCULAR | 1 refills | Status: AC

## 2016-09-20 NOTE — Progress Notes (Signed)
Subjective:  Patient ID: Katie [name], female    DOB: Nov 14, 1945  Age: 71 y.o. MRN: [id number]  CC: Hypothyroidism   HPI Katie [name] presents for f/up - Weight gain and wants to take a medication to help her lose weight. She also complains of worsening symptoms in her feet. She complains of tightness, numbness, and stinging in both lower extremities R>>>L. She had a NCS/EMG and ABI's done that were normal. She offers no other complaints today.  Outpatient Medications Prior to Visit  Medication Sig Dispense Refill  . ACCU-CHEK AVIVA PLUS test strip     . ALPRAZolam (XANAX) 0.5 MG tablet TAKE ONE TABLET BY MOUTH THREE TIMES DAILY AS NEEDED FOR ANXIETY OR SLEEP 270 tablet 1  . aspirin 81 MG EC tablet Take 81 mg by mouth daily.      . Blood Glucose Monitoring Suppl (ACCU-CHEK AVIVA PLUS) W/DEVICE KIT     . cyanocobalamin (,VITAMIN B-12,) 1000 MCG/ML injection 1000 mcg subq once a month 30 mL 1  . ergocalciferol (VITAMIN D2) 50000 UNITS capsule Take 50,000 Units by mouth once a week.      . furosemide (LASIX) 20 MG tablet TAKE ONE TABLET BY MOUTH ONCE DAILY 90 tablet 1  . gabapentin (NEURONTIN) 300 MG capsule Take 1 capsule (300 mg total) by mouth at bedtime. 90 capsule 1  . Lancet Devices (SIMPLE DIAGNOSTICS LANCING DEV) MISC     . levothyroxine (SYNTHROID, LEVOTHROID) 88 MCG tablet Take 88 mcg by mouth daily.      Marland Kitchen lisinopril (PRINIVIL,ZESTRIL) 10 MG tablet Take 1 tablet (10 mg total) by mouth daily. Yearly physical due in sept must see Md for refills 90 tablet 3  . rosuvastatin (CRESTOR) 20 MG tablet Take 1/2 daily     . vitamin E 100 UNIT capsule Take 400 Units by mouth daily.    Marland Kitchen amLODipine (NORVASC) 5 MG tablet Take 1 tablet (5 mg total) by mouth daily. 90 tablet 3  . furosemide (LASIX) 20 MG tablet Take 1 tablet (20 mg total) by mouth daily. 90 tablet 1  . TRULICITY 1.5 DC/3.0DT SOPN      No facility-administered medications prior to visit.     ROS Review of Systems    Constitutional: Positive for unexpected weight change. Negative for appetite change, diaphoresis and fatigue.  HENT: Negative.  Negative for trouble swallowing.   Eyes: Negative for visual disturbance.  Respiratory: Negative for chest tightness, shortness of breath and wheezing.   Cardiovascular: Negative for chest pain, palpitations and leg swelling.  Gastrointestinal: Negative for abdominal pain, constipation, diarrhea, nausea and vomiting.  Endocrine: Negative for cold intolerance and heat intolerance.  Genitourinary: Negative.  Negative for difficulty urinating.  Musculoskeletal: Negative for back pain and myalgias.  Skin: Negative.  Negative for color change.  Allergic/Immunologic: Negative.   Neurological: Negative.  Negative for dizziness, weakness and numbness.  Hematological: Negative for adenopathy. Does not bruise/bleed easily.  Psychiatric/Behavioral: Negative.     Objective:  BP 118/72 (BP Location: Left Arm, Patient Position: Sitting, Cuff Size: Normal)   Pulse 88   Temp 98 F (36.7 C) (Oral)   Resp 16   Ht _0  (1.676 m)   Wt 188 lb (85.3 kg)   SpO2 99%   BMI 30.34 kg/m   BP Readings from Last 3 Encounters:  09/20/16 118/72  08/14/16 92/62  07/24/16 112/88    Wt Readings from Last 3 Encounters:  09/20/16 188 lb (85.3 kg)  08/14/16 185 lb (83.9 kg)  07/24/16 188 lb 8 oz (85.5 kg)    Physical Exam  Constitutional: She is oriented to person, place, and time. No distress.  HENT:  Mouth/Throat: Oropharynx is clear and moist. No oropharyngeal exudate.  Eyes: Conjunctivae are normal. Right eye exhibits no discharge. Left eye exhibits no discharge. No scleral icterus.  Neck: Normal range of motion. Neck supple. No JVD present. No tracheal deviation present. No thyromegaly present.  Cardiovascular: Normal rate, regular rhythm, normal heart sounds and intact distal pulses.  Exam reveals no gallop and no friction rub.   No murmur heard. Pulmonary/Chest: Effort  normal and breath sounds normal. No stridor. No respiratory distress. She has no wheezes. She has no rales. She exhibits no tenderness.  Abdominal: Soft. Bowel sounds are normal. She exhibits no distension and no mass. There is no tenderness. There is no rebound and no guarding.  Musculoskeletal: Normal range of motion. She exhibits no edema, tenderness or deformity.  Lymphadenopathy:    She has no cervical adenopathy.  Neurological: She is oriented to person, place, and time.  Skin: Skin is warm and dry. No rash noted. She is not diaphoretic. No erythema. No pallor.  Vitals reviewed.   Lab Results  Component Value Date   WBC 4.6 12/13/2015   HGB 13.3 12/13/2015   HCT 38.9 12/13/2015   PLT 218.0 12/13/2015   GLUCOSE 100 (H) 12/13/2015   CHOL 123 06/01/2016   TRIG 112 06/01/2016   HDL 78.40 12/13/2015   LDLDIRECT 105.9 07/04/2007   LDLCALC 29 06/01/2016   ALT 15 06/01/2016   AST 23 06/01/2016   NA 139 06/01/2016   K 5.3 06/01/2016   CL 104 12/13/2015   CREATININE 1.3 (A) 06/01/2016   BUN 16 06/01/2016   CO2 33 (H) 12/13/2015   TSH 1.74 06/01/2016   HGBA1C 5.4 08/15/2016    Dg Lumbar Spine Complete  Result Date: 07/24/2016 CLINICAL DATA:  Right low back pain and right leg pain and numbness extending to the foot intermittently for the past 4 months. No known injury EXAM: LUMBAR SPINE - COMPLETE 4+ VIEW COMPARISON:  Lumbar spine MRI March 15, 2013 and coronal and sagittal CT images through the lumbar spine of April 05, 2012 FINDINGS: The bones are subjectively adequately mineralized. There is chronic compression deformity of L2 which appears stable. There is an anterior bridging osteophyte at L1-2. There is mild disc space narrowing at L1-2 and L2-3 and L4-5. There is grade 1 anterolisthesis of L4 with respect L5 which is more conspicuous than on the previous study. There is facet joint hypertrophy at L4-5 and at L5-S1. No pars defects are observed. IMPRESSION: Slight interval  increase in the anterolisthesis of L4 with respect to L5. Stable partial compression of L2. Degenerative disc disease at L1-2, L2-3, and L4-5 which appears stable. If the patient's symptoms warrant further evaluation, repeat lumbar spine MRI would be useful and allow direct comparison to the November 2014 study. Electronically Signed   By: David  Martinique M.D.   On: 07/24/2016 10:57    Assessment & Plan:   Rosmary was seen today for hypothyroidism.  Diagnoses and all orders for this visit:  Lower extremity numbness- screening for hep C is negative, I don't know what he is causing her lower extremity symptoms but I am concerned she may have a small fiber neuropathy. I've asked her to see neurology to consider additional diagnostic and treatment options. -     Ambulatory referral to Neurology -     Hepatitis  C antibody; Future  Obesity, Class I, BMI 30.0-34.9 (see actual BMI)- in addition to lifestyle modifications, I have asked her to try Belviq to decrease her caloric intake and help her lose weight. -     Lorcaserin HCl (BELVIQ) 10 MG TABS; Take 1 tablet by mouth 2 (two) times daily.  Other specified hypothyroidism- Her TSH is in the normal range, she will remain on the current dose of levothyroxine. -     TSH; Future  Other orders -     Zoster Vac Recomb Adjuvanted [facility]) injection; Inject 0.5 mLs into the muscle once.   I have discontinued Ms. Mull's amLODipine and TRULICITY. I am also having her start on Lorcaserin HCl and Zoster Vac Recomb Adjuvanted. Additionally, I am having her maintain her rosuvastatin, aspirin, levothyroxine, ergocalciferol, vitamin E, ACCU-CHEK AVIVA PLUS, ACCU-CHEK AVIVA PLUS, SIMPLE DIAGNOSTICS LANCING DEV, lisinopril, cyanocobalamin, gabapentin, furosemide, and ALPRAZolam.  Meds ordered this encounter  Medications  . Lorcaserin HCl (BELVIQ) 10 MG TABS    Sig: Take 1 tablet by mouth 2 (two) times daily.    Dispense:  60 tablet    Refill:  1  . Zoster Vac  Recomb Adjuvanted [facility]) injection    Sig: Inject 0.5 mLs into the muscle once.    Dispense:  1 each    Refill:  1     Follow-up: No Follow-up on file.  Scarlette Calico, MD

## 2016-09-20 NOTE — Patient Instructions (Signed)

## 2016-09-21 LAB — HEPATITIS C ANTIBODY: HCV Ab: NEGATIVE

## 2016-09-26 ENCOUNTER — Encounter: Payer: Self-pay | Admitting: Neurology

## 2016-09-27 ENCOUNTER — Ambulatory Visit: Payer: Medicare Other | Admitting: Internal Medicine

## 2016-09-28 ENCOUNTER — Telehealth: Payer: Self-pay

## 2016-09-28 NOTE — Telephone Encounter (Addendum)
  Key: [id number]

## 2016-10-04 NOTE — Telephone Encounter (Signed)
PA denied.   Pt informed of same.

## 2016-10-19 ENCOUNTER — Ambulatory Visit: Payer: Medicare Other | Admitting: Family Medicine

## 2016-10-23 ENCOUNTER — Ambulatory Visit: Payer: Medicare Other | Admitting: Family Medicine

## 2016-10-25 ENCOUNTER — Ambulatory Visit (INDEPENDENT_AMBULATORY_CARE_PROVIDER_SITE_OTHER): Payer: Medicare Other | Admitting: Family Medicine

## 2016-10-25 ENCOUNTER — Encounter: Payer: Self-pay | Admitting: Family Medicine

## 2016-10-25 DIAGNOSIS — M48062 Spinal stenosis, lumbar region with neurogenic claudication: Secondary | ICD-10-CM

## 2016-10-25 MED ORDER — GABAPENTIN 300 MG PO CAPS: 300.0000 mg | ORAL_CAPSULE | Freq: Every day | ORAL | 1 refills | Status: DC

## 2016-10-25 NOTE — Assessment & Plan Note (Signed)
Doing well at this time. No change in management. Patient follow-up when needed

## 2016-10-25 NOTE — Progress Notes (Signed)
[name] [place],  [address] Phone: [phone] Subjective:    I'm seeing this patient by the request  of:  Katie Lima, MD   CC: Low back pain f/u   XTK:[id number]  Katie [name] is a 71 y.o. female coming in with complaint of low back pain. Patient does have known spinal stenosis. Patient was having pain that seemed to be more on the lateral aspect of the hip.  Patient was started on increasing gabapentin 300 mg and states that some residual radicular symptoms she was having is completely resolved at this time. States that she is doing very well. Not having any significant discomfort. Very happy with the results.   vitamin D 66 Patient did have an MRI of the lumbar spine November 2014. This was independently visualized by me. Patient was found to have significant spondylosis at L4-L5 remote history of a compression fracture at L2. Moderate spinal stenosis at L4-L5.  Repeat x-rays showing the patient did have slight interval increase of anterior listhesis at L4 on L5. These were taken on 07/24/2016. Independently visualized by me again today.  Past Medical History:  Diagnosis Date  . Acute venous embolism and thrombosis of unspecified deep vessels of lower extremity 2001   RLE DVT and PE, coumadin x 34mo, now ASSA  . Anxiety state, unspecified   . Degeneration of cervical intervertebral disc   . History of DVT (deep vein thrombosis)   . Iron deficiency anemia secondary to blood loss (chronic)   . Irritable bowel syndrome   . Lumbago   . Other B-complex deficiencies   . Personal history of colonic polyps   . Pure hypercholesterolemia   . Reflux esophagitis   . Type II or unspecified type diabetes mellitus without mention of complication, not stated as uncontrolled   . Unspecified essential hypertension   . Unspecified hypothyroidism   . Unspecified iridocyclitis   . Unspecified venous (peripheral) insufficiency   .  Unspecified vitamin D deficiency    Past Surgical History:  Procedure Laterality Date  . CARPAL TUNNEL RELEASE    . FEMORAL HERNIA REPAIR    . OOPHORECTOMY    . VESICOVAGINAL FISTULA CLOSURE W/ TAH     Social History   Social History  . Marital status: Married    Spouse name: Katie [name]  . Number of children: 0  . Years of education: N/A   Occupational History  . record specialist   .  Unemployed   Social History Main Topics  . Smoking status: Never Smoker  . Smokeless tobacco: Never Used  . Alcohol use 0.0 oz/week     Comment: occasional  . Drug use: No  . Sexual activity: Not Asked   Other Topics Concern  . None   Social History Narrative  . None   Allergies  Allergen Reactions  . Metoprolol Succinate     REACTION: ? rash from Toprol ?  Marland Kitchen Penicillins     REACTION: allergic to penicillin  . Sulfamethoxazole-Trimethoprim     Other reaction(s): Other (See Comments) Reaction not listed  . Metformin Nausea Only   Family History  Problem Relation Age of Onset  . Prostate cancer Father   . Diabetes Mother        retinopathy/blind  . Deep vein thrombosis Mother   . Hypertension Mother     Past medical history, social, surgical and family history all reviewed in electronic medical record.  No pertanent information unless stated  regarding to the chief complaint.   Review of Systems: No headache, visual changes, nausea, vomiting, diarrhea, constipation, dizziness, abdominal pain, skin rash, fevers, chills, night sweats, weight loss, swollen lymph nodes, body aches, joint swelling,chest pain, shortness of breath, mood changes.   Objective  Blood pressure 122/84, pulse 80, height 5\' 6"  (1.676 m), weight 188 lb (85.3 kg).   Systems examined below as of 10/25/16 General: NAD A&O x3 mood, affect normal  HEENT: Pupils equal, extraocular movements intact no nystagmus Respiratory: not short of breath at rest or with speaking Cardiovascular: No lower extremity edema,  non tender Skin: Warm dry intact with no signs of infection or rash on extremities or on axial skeleton. Abdomen: Soft nontender, no masses Neuro: Cranial nerves  intact, neurovascularly intact in all extremities with 2+ DTRs and 2+ pulses. Lymph: No lymphadenopathy appreciated today  Gait normal with good balance and coordination.  MSK:  Non tender with full range of motion and good stability and symmetric strength and tone of shoulders, elbows, wrist, hip, knee and ankles bilaterally. Changes of multiple joints.  Back Exam:  Inspection: Unremarkable  Motion: Flexion 40 deg, Extension 20 deg , Side Bending to 35 deg bilaterally,  Rotation to 35 deg bilaterally  SLR laying: Negative  XSLR laying: Negative  Palpable tenderness: Nontender on exam today. FABER: negative. Sensory change: Gross sensation intact to all lumbar and sacral dermatomes.  Reflexes: 2+ at both patellar tendons, 2+ at achilles tendons, Babinski's downgoing.  Strength at foot  Near full strength bilaterally.      Impression and Recommendations:     This case required medical decision making of moderate complexity.      Note: This dictation was prepared with Dragon dictation along with smaller phrase technology. Any transcriptional errors that result from this process are unintentional.

## 2016-10-25 NOTE — Patient Instructions (Signed)
I am so happy you are doing well.  Ice is your friend.  Stay active Continue the gabapentin  [employer] orthotics "total support" online would be great  You know where I am if you need me.

## 2016-11-02 ENCOUNTER — Telehealth: Payer: Self-pay

## 2016-11-02 ENCOUNTER — Ambulatory Visit (INDEPENDENT_AMBULATORY_CARE_PROVIDER_SITE_OTHER): Payer: Medicare Other | Admitting: Internal Medicine

## 2016-11-02 ENCOUNTER — Encounter: Payer: Self-pay | Admitting: Internal Medicine

## 2016-11-02 VITALS — BP 120/76 | HR 77 | Temp 98.5°F | Wt 186.2 lb

## 2016-11-02 DIAGNOSIS — K5904 Chronic idiopathic constipation: Secondary | ICD-10-CM | POA: Diagnosis not present

## 2016-11-02 DIAGNOSIS — L989 Disorder of the skin and subcutaneous tissue, unspecified: Secondary | ICD-10-CM | POA: Diagnosis not present

## 2016-11-02 DIAGNOSIS — E538 Deficiency of other specified B group vitamins: Secondary | ICD-10-CM | POA: Diagnosis not present

## 2016-11-02 HISTORY — DX: Chronic idiopathic constipation: K59.04

## 2016-11-02 MED ORDER — ZOSTER VAC RECOMB ADJUVANTED 50 MCG/0.5ML IM SUSR: 0.5000 mL | Freq: Once | INTRAMUSCULAR | 1 refills | Status: AC

## 2016-11-02 MED ORDER — CYANOCOBALAMIN 1000 MCG/ML IJ SOLN: INTRAMUSCULAR | 1 refills | Status: DC

## 2016-11-02 MED ORDER — LINACLOTIDE 72 MCG PO CAPS: 72.0000 ug | ORAL_CAPSULE | Freq: Every day | ORAL | 1 refills | Status: DC

## 2016-11-02 NOTE — Patient Instructions (Signed)

## 2016-11-02 NOTE — Progress Notes (Signed)
Subjective:  Patient ID: Katie [name], female    DOB: 10-12-1945  Age: 71 y.o. MRN: [id number]  CC: Constipation   HPI Katie [name] presents for f/up - She complains of constipation and rare episodes of bloating. Her constipation gets so severe that about every 2 or 3 weeks she is using a bottle of magnesium citrate to clear herself out. She denies abdominal pain, nausea, vomiting, loss of appetite, or bloody stools. She also complains of a lesion on the right side of her face and wants to see a dermatologist.  Outpatient Medications Prior to Visit  Medication Sig Dispense Refill  . ACCU-CHEK AVIVA PLUS test strip     . ALPRAZolam (XANAX) 0.5 MG tablet TAKE ONE TABLET BY MOUTH THREE TIMES DAILY AS NEEDED FOR ANXIETY OR SLEEP 270 tablet 1  . aspirin 81 MG EC tablet Take 81 mg by mouth daily.      . Blood Glucose Monitoring Suppl (ACCU-CHEK AVIVA PLUS) W/DEVICE KIT     . ergocalciferol (VITAMIN D2) 50000 UNITS capsule Take 50,000 Units by mouth once a week.      . furosemide (LASIX) 20 MG tablet TAKE ONE TABLET BY MOUTH ONCE DAILY 90 tablet 1  . gabapentin (NEURONTIN) 300 MG capsule Take 1 capsule (300 mg total) by mouth at bedtime. 90 capsule 1  . Lancet Devices (SIMPLE DIAGNOSTICS LANCING DEV) MISC     . levothyroxine (SYNTHROID, LEVOTHROID) 88 MCG tablet Take 88 mcg by mouth daily.      Marland Kitchen lisinopril (PRINIVIL,ZESTRIL) 10 MG tablet Take 1 tablet (10 mg total) by mouth daily. Yearly physical due in sept must see Md for refills 90 tablet 3  . rosuvastatin (CRESTOR) 20 MG tablet Take 1/2 daily     . vitamin E 100 UNIT capsule Take 400 Units by mouth daily.    . cyanocobalamin (,VITAMIN B-12,) 1000 MCG/ML injection 1000 mcg subq once a month 30 mL 1  . Lorcaserin HCl (BELVIQ) 10 MG TABS Take 1 tablet by mouth 2 (two) times daily. (Patient not taking: Reported on 11/02/2016) 60 tablet 1   No facility-administered medications prior to visit.     ROS Review of Systems  Constitutional: Negative.   Negative for diaphoresis, fatigue and unexpected weight change.  HENT: Negative.   Eyes: Negative for visual disturbance.  Respiratory: Negative.  Negative for cough, chest tightness, shortness of breath and wheezing.   Cardiovascular: Negative for chest pain, palpitations and leg swelling.  Gastrointestinal: Positive for constipation. Negative for abdominal distention, abdominal pain, anal bleeding, diarrhea, nausea and vomiting.  Endocrine: Negative.   Genitourinary: Negative.  Negative for difficulty urinating, dysuria, enuresis and urgency.  Musculoskeletal: Negative.  Negative for back pain and neck pain.  Skin: Negative.  Negative for color change and rash.  Allergic/Immunologic: Negative.   Neurological: Negative.  Negative for dizziness and weakness.  Hematological: Negative for adenopathy. Does not bruise/bleed easily.  Psychiatric/Behavioral: Negative.     Objective:  BP 120/76 (BP Location: Left Arm, Patient Position: Sitting, Cuff Size: Normal)   Pulse 77   Temp 98.5 F (36.9 C) (Oral)   Wt 186 lb 4 oz (84.5 kg)   SpO2 99%   BMI 30.06 kg/m   BP Readings from Last 3 Encounters:  11/02/16 120/76  10/25/16 122/84  09/20/16 118/72    Wt Readings from Last 3 Encounters:  11/02/16 186 lb 4 oz (84.5 kg)  10/25/16 188 lb (85.3 kg)  09/20/16 188 lb (85.3 kg)    Physical Exam  Constitutional: She is oriented to person, place, and time. No distress.  HENT:  Mouth/Throat: Oropharynx is clear and moist. No oropharyngeal exudate.  Eyes: Conjunctivae are normal. Right eye exhibits no discharge. Left eye exhibits no discharge. No scleral icterus.  Neck: Normal range of motion. Neck supple. No JVD present. No thyromegaly present.  Cardiovascular: Normal rate, regular rhythm and intact distal pulses.  Exam reveals no gallop and no friction rub.   No murmur heard. Pulmonary/Chest: Effort normal and breath sounds normal. No respiratory distress. She has no wheezes. She has no  rales. She exhibits no tenderness.  Abdominal: Soft. Bowel sounds are normal. She exhibits no distension and no mass. There is no tenderness. There is no rebound and no guarding.  Musculoskeletal: Normal range of motion. She exhibits no edema, tenderness or deformity.  Lymphadenopathy:    She has no cervical adenopathy.  Neurological: She is alert and oriented to person, place, and time.  Skin: Skin is warm and dry. No rash noted. She is not diaphoretic. No erythema. No pallor.  The right side of her face, near the malar surface, there is a fleshy dome-shaped raised lesion that measures about 7 mm.  Vitals reviewed.   Lab Results  Component Value Date   WBC 4.6 12/13/2015   HGB 13.3 12/13/2015   HCT 38.9 12/13/2015   PLT 218.0 12/13/2015   GLUCOSE 100 (H) 12/13/2015   CHOL 123 06/01/2016   TRIG 112 06/01/2016   HDL 78.40 12/13/2015   LDLDIRECT 105.9 07/04/2007   LDLCALC 29 06/01/2016   ALT 15 06/01/2016   AST 23 06/01/2016   NA 139 06/01/2016   K 5.3 06/01/2016   CL 104 12/13/2015   CREATININE 1.3 (A) 06/01/2016   BUN 16 06/01/2016   CO2 33 (H) 12/13/2015   TSH 1.08 09/20/2016   HGBA1C 5.4 08/15/2016    Dg Lumbar Spine Complete  Result Date: 07/24/2016 CLINICAL DATA:  Right low back pain and right leg pain and numbness extending to the foot intermittently for the past 4 months. No known injury EXAM: LUMBAR SPINE - COMPLETE 4+ VIEW COMPARISON:  Lumbar spine MRI March 15, 2013 and coronal and sagittal CT images through the lumbar spine of April 05, 2012 FINDINGS: The bones are subjectively adequately mineralized. There is chronic compression deformity of L2 which appears stable. There is an anterior bridging osteophyte at L1-2. There is mild disc space narrowing at L1-2 and L2-3 and L4-5. There is grade 1 anterolisthesis of L4 with respect L5 which is more conspicuous than on the previous study. There is facet joint hypertrophy at L4-5 and at L5-S1. No pars defects are  observed. IMPRESSION: Slight interval increase in the anterolisthesis of L4 with respect to L5. Stable partial compression of L2. Degenerative disc disease at L1-2, L2-3, and L4-5 which appears stable. If the patient's symptoms warrant further evaluation, repeat lumbar spine MRI would be useful and allow direct comparison to the November 2014 study. Electronically Signed   By: David  Martinique M.D.   On: 07/24/2016 10:57    Assessment & Plan:   Katie [name] was seen today for constipation.  Diagnoses and all orders for this visit:  B12 deficiency -     cyanocobalamin (,VITAMIN B-12,) 1000 MCG/ML injection; 1000 mcg subq once a month  Benign skin lesion of face -     Ambulatory referral to Dermatology  Chronic idiopathic constipation- a review of her recent labs show no evidence of secondary or metabolic causes for constipation. Will treat  with Linzess. -     linaclotide (LINZESS) 72 MCG capsule; Take 1 capsule (72 mcg total) by mouth daily before breakfast.  Other orders -     Zoster Vac Recomb Adjuvanted (SHINGRIX) injection; Inject 0.5 mLs into the muscle once.   I have discontinued Ms. Schlagel's Lorcaserin HCl. I am also having her start on Zoster Vac Recomb Adjuvanted and linaclotide. Additionally, I am having her maintain her rosuvastatin, aspirin, levothyroxine, ergocalciferol, vitamin E, ACCU-CHEK AVIVA PLUS, ACCU-CHEK AVIVA PLUS, SIMPLE DIAGNOSTICS LANCING DEV, lisinopril, furosemide, ALPRAZolam, gabapentin, and cyanocobalamin.  Meds ordered this encounter  Medications  . cyanocobalamin (,VITAMIN B-12,) 1000 MCG/ML injection    Sig: 1000 mcg subq once a month    Dispense:  30 mL    Refill:  1  . Zoster Vac Recomb Adjuvanted [facility]) injection    Sig: Inject 0.5 mLs into the muscle once.    Dispense:  1 each    Refill:  1  . linaclotide (LINZESS) 72 MCG capsule    Sig: Take 1 capsule (72 mcg total) by mouth daily before breakfast.    Dispense:  90 capsule    Refill:  1      Follow-up: No Follow-up on file.  Scarlette Calico, MD

## 2016-11-09 NOTE — Telephone Encounter (Signed)
Called pt and informed that a multidose vial is only good for 30 days once the seal is broken.   Pt stated understanding. PA will not be completed.

## 2016-11-13 LAB — VITAMIN D 25 HYDROXY (VIT D DEFICIENCY, FRACTURES): Vit D, 25-Hydroxy: 86

## 2016-11-13 LAB — HM DEXA SCAN: HM Dexa Scan: NORMAL

## 2016-11-13 LAB — LIPID PANEL
Cholesterol: 106 (ref 0–200)
HDL: 65 (ref 35–70)
LDL Cholesterol: 24
Triglycerides: 72 (ref 40–160)

## 2016-11-13 LAB — HEMOGLOBIN A1C: Hemoglobin A1C: 5.5

## 2016-11-13 LAB — HEPATIC FUNCTION PANEL
ALT: 16 (ref 7–35)
AST: 30 (ref 13–35)
Alkaline Phosphatase: 43 (ref 25–125)
Bilirubin, Total: 1.1

## 2016-11-13 LAB — BASIC METABOLIC PANEL
BUN: 12 (ref 4–21)
Creatinine: 1.2 — AB (ref 0.5–1.1)
Glucose: 98
Potassium: 4.5 (ref 3.4–5.3)
Sodium: 140 (ref 137–147)

## 2016-11-13 LAB — TSH: TSH: 3.03 (ref 0.41–5.90)

## 2016-11-13 LAB — VITAMIN B12: Vitamin B-12: 599

## 2016-11-14 ENCOUNTER — Encounter: Payer: Self-pay | Admitting: Internal Medicine

## 2016-11-14 NOTE — Progress Notes (Signed)
Result Abstracted 

## 2016-11-17 ENCOUNTER — Telehealth: Payer: Self-pay | Admitting: Internal Medicine

## 2016-11-17 NOTE — Telephone Encounter (Signed)
Can you call pt and let her know that it will not be started until Monday and that is with Dr. Stevenson Clinch approval.

## 2016-11-17 NOTE — Telephone Encounter (Signed)
Pt would like a call back when her Madaline Savage Summons is ready, she needs it ASAP

## 2016-11-21 NOTE — Telephone Encounter (Signed)
Letter completed and given to PCP to sign.

## 2016-11-22 NOTE — Telephone Encounter (Signed)
Pt given letter

## 2016-12-18 ENCOUNTER — Encounter: Payer: Self-pay | Admitting: Neurology

## 2016-12-18 ENCOUNTER — Ambulatory Visit (INDEPENDENT_AMBULATORY_CARE_PROVIDER_SITE_OTHER): Payer: Medicare Other | Admitting: Neurology

## 2016-12-18 VITALS — BP 100/70 | HR 86 | Ht 66.0 in | Wt 186.1 lb

## 2016-12-18 DIAGNOSIS — G5761 Lesion of plantar nerve, right lower limb: Secondary | ICD-10-CM | POA: Diagnosis not present

## 2016-12-18 DIAGNOSIS — R209 Unspecified disturbances of skin sensation: Secondary | ICD-10-CM

## 2016-12-18 NOTE — Addendum Note (Signed)
Addended by: Alda Berthold on: 12/18/2016 03:35 PM   Modules accepted: Level of Service

## 2016-12-18 NOTE — Progress Notes (Signed)
[facility] Clinic Note - Initial Visit   Date: 12/18/16  [name] MRN: [id number] DOB: [birth year]   Dear Dr. Ronnald Ramp:  Thank you for your kind referral of [name] for consultation of right foot numbness. Although her history is well known to you, please allow [name] to reiterate it for the purpose of our medical record. The patient was accompanied to the clinic by self.    History of Present Illness: Katie [name] is a 71 y.o. right-handed [place] American female with hypothyroidism, hyperlipidemia, hypertension, diabetes mellitus c/b stage 3 CKD, and obesity presenting for evaluation of right foot numbness.   Starting around late 2017, she began having numbness over the ball of the right foot. She was diagnosed with Morton's neuroma by her podiatrist and had steroid injection which helped some, but symptoms returned.  Symptoms are constant with no identifiable aggravating factors.  No associated weakness, radicular pain, or imbalance.  She denies any similar symptoms of the left foot.  NCS/EMG of the right leg was entirely normal.  She also has intermittent cold and hot sensation over the entire right lower leg and foot, which involves a circumferential pattern from the knee down.  She often wears a sock at night because of cold sensation, but at night, the leg feels hot, so she tries to place it on a cool area of the sheet.  She denies exertional leg pain.  Her vascular studies from 2016 were normal.   She has rare spells of sharp stabbing pain in the great toe.      Out-side paper records, electronic medical record, and images have been reviewed where available and summarized as:  NCS/EMG right leg 06/06/2016:  This is a normal study of the right lower extremity.   In particular, there is no evidence of a generalized sensorimotor polyneuropathy or lumbosacral radiculopathy.  Vascular studies of the legs 01/28/2015:  Normal  MRI lumbar spine wo contrast 03/15/2013:   Multilevel spondylosis as described, most notable at L4-5, right greater than left. See discussion above.  Slight progression of anterolisthesis at L4-5 when compared with December 2013. Consider standing flexion extension lumbar radiographs to evaluate for dynamic instability. Healed compression deformity L2.  Lab Results  Component Value Date   [id number] 616 11/13/2016     Past Medical History:  Diagnosis Date  . Acute venous embolism and thrombosis of unspecified deep vessels of lower extremity 2001   RLE DVT and PE, coumadin x 32mo now ASSA  . Anxiety state, unspecified   . Degeneration of cervical intervertebral disc   . History of DVT (deep vein thrombosis)   . Iron deficiency anemia secondary to blood loss (chronic)   . Irritable bowel syndrome   . Lumbago   . Other B-complex deficiencies   . Personal history of colonic polyps   . Pure hypercholesterolemia   . Reflux esophagitis   . Type II or unspecified type diabetes mellitus without mention of complication, not stated as uncontrolled   . Unspecified essential hypertension   . Unspecified hypothyroidism   . Unspecified iridocyclitis   . Unspecified venous (peripheral) insufficiency   . Unspecified vitamin D deficiency     Past Surgical History:  Procedure Laterality Date  . CARPAL TUNNEL RELEASE    . FEMORAL HERNIA REPAIR    . OOPHORECTOMY    . VESICOVAGINAL FISTULA CLOSURE W/ TAH       Medications:  Outpatient Encounter Prescriptions as of 12/18/2016  Medication Sig Note  . ACCU-CHEK AVIVA  PLUS test strip  09/02/2013: Received from: External Pharmacy  . ALPRAZolam (XANAX) 0.5 MG tablet TAKE ONE TABLET BY MOUTH THREE TIMES DAILY AS NEEDED FOR ANXIETY OR SLEEP   . aspirin 81 MG EC tablet Take 81 mg by mouth daily.     . Blood Glucose Monitoring Suppl (ACCU-CHEK AVIVA PLUS) W/DEVICE KIT  09/02/2013: Received from: External Pharmacy  . cyanocobalamin (,VITAMIN B-12,) 1000 MCG/ML injection 1000 mcg subq once a month    . ergocalciferol (VITAMIN D2) 50000 UNITS capsule Take 50,000 Units by mouth once a week.     . furosemide (LASIX) 20 MG tablet TAKE ONE TABLET BY MOUTH ONCE DAILY   . gabapentin (NEURONTIN) 300 MG capsule Take 1 capsule (300 mg total) by mouth at bedtime.   Elmore Guise Devices (SIMPLE DIAGNOSTICS LANCING DEV) MISC  02/11/2015: Received from: External Pharmacy  . levothyroxine (SYNTHROID, LEVOTHROID) 88 MCG tablet Take 88 mcg by mouth daily.     Marland Kitchen linaclotide (LINZESS) 72 MCG capsule Take 1 capsule (72 mcg total) by mouth daily before breakfast.   . lisinopril (PRINIVIL,ZESTRIL) 10 MG tablet Take 1 tablet (10 mg total) by mouth daily. Yearly physical due in sept must see Md for refills   . rosuvastatin (CRESTOR) 20 MG tablet Take 1/2 daily    . vitamin E 100 UNIT capsule Take 400 Units by mouth daily.    No facility-administered encounter medications on file as of 12/18/2016.      Allergies:  Allergies  Allergen Reactions  . Metoprolol Succinate     REACTION: ? rash from Toprol ?  Marland Kitchen Penicillins     REACTION: allergic to penicillin  . Sulfamethoxazole-Trimethoprim     Other reaction(s): Other (See Comments) Reaction not listed  . Metformin Nausea Only    Family History: Family History  Problem Relation Age of Onset  . Prostate cancer Father   . Diabetes Mother        retinopathy/blind  . Deep vein thrombosis Mother   . Hypertension Mother   . Healthy Sister     Social History: Social History  Substance Use Topics  . Smoking status: Never Smoker  . Smokeless tobacco: Never Used  . Alcohol use 0.0 oz/week     Comment: occasional   Social History   Social History Narrative   Lives with husband in a 2 story home with a basement.  Has no children.     Retired from the police department.     Education: masters (guidance and counseling).     Review of Systems:  CONSTITUTIONAL: No fevers, chills, night sweats, or weight loss.   EYES: No visual changes or eye pain ENT: No  hearing changes.  No history of nose bleeds.   RESPIRATORY: No cough, wheezing and shortness of breath.   CARDIOVASCULAR: Negative for chest pain, and palpitations.   GI: Negative for abdominal discomfort, blood in stools or black stools.  No recent change in bowel habits.   GU:  No history of incontinence.   MUSCLOSKELETAL: No history of joint pain or swelling.  No myalgias.   SKIN: Negative for lesions, rash, and itching.   HEMATOLOGY/ONCOLOGY: Negative for prolonged bleeding, bruising easily, and swollen nodes.  No history of cancer.   ENDOCRINE: Negative for cold or heat intolerance, polydipsia or goiter.   PSYCH:  No depression or anxiety symptoms.   NEURO: As Above.   Vital Signs:  BP 100/70   Pulse 86   Ht 5' 6" (1.676 m)   Wt  186 lb 2 oz (84.4 kg)   SpO2 94%   BMI 30.04 kg/m    General Medical Exam:   General:  Well appearing, comfortable.   Eyes/ENT: see cranial nerve examination.   Neck: No masses appreciated.  Full range of motion without tenderness.  No carotid bruits. Respiratory:  Clear to auscultation, good air entry bilaterally.   Cardiac:  Regular rate and rhythm, no murmur.   Extremities:  No deformities, edema, or skin discoloration.  Skin:  No rashes or lesions.  Neurological Exam: MENTAL STATUS including orientation to time, place, person, recent and remote memory, attention span and concentration, language, and fund of knowledge is normal.  Speech is not dysarthric.  CRANIAL NERVES: II:  No visual field defects.  Unremarkable fundi.   III-IV-VI: Pupils equal round and reactive to light.  Normal conjugate, extra-ocular eye movements in all directions of gaze.  No nystagmus.  No ptosis.   V:  Normal facial sensation   VII:  Normal facial symmetry and movements.  VIII:  Normal hearing and vestibular function.   IX-X:  Normal palatal movement.   XI:  Normal shoulder shrug and head rotation.   XII:  Normal tongue strength and range of motion, no deviation or  fasciculation.  MOTOR:  No atrophy, fasciculations or abnormal movements.  No pronator drift.  Tone is normal.    Right Upper Extremity:    Left Upper Extremity:    Deltoid  5/5   Deltoid  5/5   Biceps  5/5   Biceps  5/5   Triceps  5/5   Triceps  5/5   Wrist extensors  5/5   Wrist extensors  5/5   Wrist flexors  5/5   Wrist flexors  5/5   Finger extensors  5/5   Finger extensors  5/5   Finger flexors  5/5   Finger flexors  5/5   Dorsal interossei  5/5   Dorsal interossei  5/5   Abductor pollicis  5/5   Abductor pollicis  5/5   Tone (Ashworth scale)  0  Tone (Ashworth scale)  0   Right Lower Extremity:    Left Lower Extremity:    Hip flexors  5/5   Hip flexors  5/5   Hip extensors  5/5   Hip extensors  5/5   Knee flexors  5/5   Knee flexors  5/5   Knee extensors  5/5   Knee extensors  5/5   Dorsiflexors  5/5   Dorsiflexors  5/5   Plantarflexors  5/5   Plantarflexors  5/5   Toe extensors  5/5   Toe extensors  5/5   Toe flexors  5/5   Toe flexors  5/5   Tone (Ashworth scale)  0  Tone (Ashworth scale)  0   MSRs:  Right                                                                 Left brachioradialis 2+  brachioradialis 2+  biceps 2+  biceps 2+  triceps 2+  triceps 2+  patellar 2+  patellar 2+  ankle jerk 2+  ankle jerk 2+  Hoffman no  Hoffman no  plantar response down  plantar response down   SENSORY:  Vibration and pin prick  is reduced at the right great toe only.  Temperature and light touch intact throughout.  Sensation in the upper extremities and left leg is normal.  Romberg's sign absent.   COORDINATION/GAIT: Normal finger-to- nose-finger and heel-to-shin.  Intact rapid alternating movements bilaterally.  Able to rise from a chair without using arms.  Gait narrow based and stable. Tandem and stressed gait intact.    IMPRESSION: Right foot numbness, suggestive of Morton's neuroma given localized symptoms and improvement with steroid injection in the past.  The lack of  symmetric complains and normal EMG makes neuropathy unlikely.  Small fiber neuropathy would also be expected to manifest with symmetry especially with symptoms ongoing for > 1 year.  I recommend that she follow-up with her podiatrist to further evaluate her right foot neuroma or to see if repeat injection is possible.  Consider skin biopsy pending her follow-up with podiatry, Dr. Rosemary Holms.   Episodic spells of hot/cold sensation of the entire right lower leg and foot is of unclear etiology.  This does not suggest a primary nerve pathology has it does not confirm to a dermatomal or cutaneous nerve distribution.  Check vascular studies of the legs.    The duration of this appointment visit was 50 minutes of face-to-face time with the patient.  Greater than 50% of this time was spent in counseling, explanation of diagnosis, planning of further management, and coordination of care.   Thank you for allowing me to participate in patient's care.  If I can answer any additional questions, I would be pleased to do so.    Sincerely,    Wynn Kernes K. Posey Pronto, DO

## 2016-12-18 NOTE — Patient Instructions (Addendum)
Vascular studies of the legs Follow-up with your podiatrist for right foot neuroma  We will call you with the results of your testing

## 2016-12-29 ENCOUNTER — Ambulatory Visit (HOSPITAL_COMMUNITY)
Admission: RE | Admit: 2016-12-29 | Discharge: 2016-12-29 | Disposition: A | Discharge status: Home / Self Care | Payer: Medicare Other | Source: Ambulatory Visit | Attending: Cardiovascular Disease | Admitting: Cardiovascular Disease

## 2016-12-29 DIAGNOSIS — R209 Unspecified disturbances of skin sensation: Secondary | ICD-10-CM | POA: Insufficient documentation

## 2016-12-29 DIAGNOSIS — G5761 Lesion of plantar nerve, right lower limb: Secondary | ICD-10-CM | POA: Diagnosis not present

## 2016-12-29 DIAGNOSIS — I739 Peripheral vascular disease, unspecified: Secondary | ICD-10-CM | POA: Diagnosis not present

## 2017-01-01 ENCOUNTER — Telehealth: Payer: Self-pay | Admitting: *Deleted

## 2017-01-01 NOTE — Telephone Encounter (Signed)
Left message informing patient.

## 2017-01-01 NOTE — Telephone Encounter (Signed)
-----   Message from Katie Berthold, DO sent at 01/01/2017  2:41 PM EDT ----- Please inform patient that her [name] looks good, there are no major arteries showing any narrowing that would cause her leg symptoms.

## 2017-01-04 ENCOUNTER — Other Ambulatory Visit: Payer: Self-pay | Admitting: Internal Medicine

## 2017-01-04 DIAGNOSIS — E538 Deficiency of other specified B group vitamins: Secondary | ICD-10-CM

## 2017-01-04 MED ORDER — CYANOCOBALAMIN 1000 MCG/ML IJ SOLN: INTRAMUSCULAR | 1 refills | Status: DC

## 2017-03-21 ENCOUNTER — Telehealth: Payer: Self-pay | Admitting: Internal Medicine

## 2017-03-21 NOTE — Telephone Encounter (Signed)
Patients health insurance called stating that patient needed samples of trulicity and wanted to know if Dr. Ronnald Ramp would be able to help patient.  States she has fallen into the donut hole.  Patient is prescribed Trulicity by Dr. Legrand Como [place].  States she does 1.5/ every 14 days.

## 2017-03-21 NOTE — Telephone Encounter (Signed)
I have a box for the patient and also here is a number for patient assistance.   Contact us at [phone]

## 2017-03-22 NOTE — Telephone Encounter (Signed)
Left patient vm °

## 2017-03-23 ENCOUNTER — Telehealth: Payer: Self-pay | Admitting: Internal Medicine

## 2017-03-23 NOTE — Telephone Encounter (Signed)
ALPRAZolam Duanne Moron) 0.5 MG tablet   [place], [place] [employer] [phone] (Phone) [phone] (Fax)    Patient is requesting a refill.

## 2017-03-23 NOTE — Progress Notes (Addendum)
Subjective:   Katie [name] is a 71 y.o. female who presents for Medicare Annual (Subsequent) preventive examination.  Review of Systems:  No ROS.  Medicare Wellness Visit. Additional risk factors are reflected in the social history.  Cardiac Risk Factors include: advanced age (>41mn, >>44women);diabetes mellitus;dyslipidemia;hypertension Sleep patterns: gets up 1-2 times nightly to void and sleeps 7-8 hours nightly.    Home Safety/Smoke Alarms: Feels safe in home. Smoke alarms in place.  Living environment; residence and Firearm Safety: 1-story house/ trailer, no firearms.Lives with husband, no needs for DME, good support system Seat Belt Safety/Bike Helmet: Wears seat belt.     Objective:     Vitals: BP 126/68   Pulse 79   Resp 20   Ht _0  (1.676 m)   Wt 188 lb (85.3 kg)   SpO2 99%   BMI 30.34 kg/m   Body mass index is 30.34 kg/m.   Tobacco Social History   Tobacco Use  Smoking Status Never Smoker  Smokeless Tobacco Never Used     Counseling given: Not Answered   Past Medical History:  Diagnosis Date  . Acute venous embolism and thrombosis of unspecified deep vessels of lower extremity 2001   RLE DVT and PE, coumadin x 185monow ASSA  . Anxiety state, unspecified   . Degeneration of cervical intervertebral disc   . History of DVT (deep vein thrombosis)   . Iron deficiency anemia secondary to blood loss (chronic)   . Irritable bowel syndrome   . Lumbago   . Other B-complex deficiencies   . Personal history of colonic polyps   . Pure hypercholesterolemia   . Reflux esophagitis   . Type II or unspecified type diabetes mellitus without mention of complication, not stated as uncontrolled   . Unspecified essential hypertension   . Unspecified hypothyroidism   . Unspecified iridocyclitis   . Unspecified venous (peripheral) insufficiency   . Unspecified vitamin D deficiency    Past Surgical History:  Procedure Laterality Date  . CARPAL TUNNEL RELEASE    .  FEMORAL HERNIA REPAIR    . OOPHORECTOMY    . VESICOVAGINAL FISTULA CLOSURE W/ TAH     Family History  Problem Relation Age of Onset  . Prostate cancer Father   . Diabetes Mother        retinopathy/blind  . Deep vein thrombosis Mother   . Hypertension Mother   . Healthy Sister    Social History   Substance and Sexual Activity  Sexual Activity Not on file    Outpatient Encounter Medications as of 03/26/2017  Medication Sig  . ACCU-CHEK AVIVA PLUS test strip   . ALPRAZolam (XANAX) 0.5 MG tablet TAKE ONE TABLET BY MOUTH THREE TIMES DAILY AS NEEDED FOR ANXIETY OR SLEEP  . aspirin 81 MG EC tablet Take 81 mg by mouth daily.    . Blood Glucose Monitoring Suppl (ACCU-CHEK AVIVA PLUS) W/DEVICE KIT   . cyanocobalamin (,VITAMIN B-12,) 1000 MCG/ML injection 1000 mcg subq once a month  . ergocalciferol (VITAMIN D2) 50000 UNITS capsule Take 50,000 Units by mouth once a week.    . furosemide (LASIX) 20 MG tablet TAKE ONE TABLET BY MOUTH ONCE DAILY  . gabapentin (NEURONTIN) 300 MG capsule Take 1 capsule (300 mg total) by mouth at bedtime.  . Elmore Guiseevices (SIMPLE DIAGNOSTICS LANCING DEV) MISC   . levothyroxine (SYNTHROID, LEVOTHROID) 88 MCG tablet Take 88 mcg by mouth daily.    . Marland Kitcheninaclotide (LINZESS) 72 MCG capsule Take 1 capsule (  72 mcg total) by mouth daily before breakfast.  . lisinopril (PRINIVIL,ZESTRIL) 10 MG tablet Take 1 tablet (10 mg total) by mouth daily. Yearly physical due in sept must see Md for refills  . rosuvastatin (CRESTOR) 20 MG tablet Take 1/2 daily   . vitamin E 100 UNIT capsule Take 400 Units by mouth daily.   No facility-administered encounter medications on file as of 03/26/2017.     Activities of Daily Living In your present state of health, do you have any difficulty performing the following activities: 03/26/2017  Hearing? N  Vision? N  Difficulty concentrating or making decisions? N  Walking or climbing stairs? N  Dressing or bathing? N  Doing errands,  shopping? N  Preparing Food and eating ? N  Using the Toilet? N  In the past six months, have you accidently leaked urine? N  Do you have problems with loss of bowel control? N  Managing your Medications? N  Managing your Finances? N  Housekeeping or managing your Housekeeping? N  Some recent data might be hidden    Patient Care Team: Janith Lima, MD as PCP - General (Internal Medicine) Altheimer, Legrand Como, MD (Endocrinology) Ladene Artist, MD (Gastroenterology) Rosemary Holms, Sumner (Podiatry) Rutherford Guys, MD (Ophthalmology) Carren Rang, Nadara Mustard, MD (Gynecology)    Assessment:    Physical assessment deferred to PCP.  Exercise Activities and Dietary recommendations Current Exercise Habits: Structured exercise class, Type of exercise: walking;yoga;strength training/weights, Time (Minutes): 55, Frequency (Times/Week): 4, Weekly Exercise (Minutes/Week): 220, Intensity: Mild, Exercise limited by: None identified  Diet (meal preparation, eat out, water intake, caffeinated beverages, dairy products, fruits and vegetables): in general, a "healthy" diet  , on average, 2 meals per day   Reviewed heart healthy and diabetic diet, encouraged patient to increase daily water intake. Discussed not skipping meals, Diet education was attached to patient's AVS. Relevant patient education assigned to patient using Emmi.  Goals    None     Fall Risk Fall Risk  03/26/2017 12/18/2016 03/14/2016 02/16/2016 02/11/2015  Falls in the past year? _0    Depression Screen PHQ 2/9 Scores 03/26/2017 03/14/2016 02/16/2016 02/11/2015  PHQ - 2 Score 1 0 0 0  PHQ- 9 Score 1 - - -     Cognitive Function MMSE - Mini Mental State Exam 03/26/2017 02/16/2016  Not completed: - (No Data)  Orientation to time 5 -  Orientation to Place 5 -  Registration 3 -  Attention/ Calculation 4 -  Recall 2 -  Language- name 2 objects 2 -  Language- repeat 1 -  Language- follow 3 step command 3 -  Language- read &  follow direction 1 -  Write a sentence 1 -  Copy design 1 -  Total score 28 -        Immunization History  Administered Date(s) Administered  . Influenza Split 02/28/2012, 02/15/2013, 01/27/2014  . Influenza Whole 02/13/2008, 04/07/2009  . Influenza-Unspecified 02/09/2015, 02/04/2016  . Pneumococcal Conjugate-13 08/11/2014  . Pneumococcal Polysaccharide-23 05/31/2011  . Tdap 03/16/2015   Screening Tests Health Maintenance  Topic Date Due  . INFLUENZA VACCINE  12/13/2016  . HEMOGLOBIN A1C  05/16/2017  . OPHTHALMOLOGY EXAM  07/26/2017  . FOOT EXAM  09/20/2017  . MAMMOGRAM  04/17/2018  . COLONOSCOPY  09/03/2018  . TETANUS/TDAP  03/15/2025  . DEXA SCAN  Completed  . Hepatitis C Screening  Completed  . PNA vac Low Risk Adult  Completed      Plan:  Continue doing brain stimulating activities (puzzles, reading, adult coloring books, staying active) to keep memory sharp.   Continue to eat heart healthy diet (full of fruits, vegetables, whole grains, lean protein, water--limit salt, fat, and sugar intake) and increase physical activity as tolerated.  I have personally reviewed and noted the following in the patient's chart:   . Medical and social history . Use of alcohol, tobacco or illicit drugs  . Current medications and supplements . Functional ability and status . Nutritional status . Physical activity . Advanced directives . List of other physicians . Vitals . Screenings to include cognitive, depression, and falls . Referrals and appointments  In addition, I have reviewed and discussed with patient certain preventive protocols, quality metrics, and best practice recommendations. A written personalized care plan for preventive services as well as general preventive health recommendations were provided to patient.     Michiel Cowboy, RN  03/26/2017  Medical screening examination/treatment/procedure(s) were performed by non-physician practitioner and as supervising  physician I was immediately available for consultation/collaboration. I agree with above. Scarlette Calico, MD

## 2017-03-26 ENCOUNTER — Ambulatory Visit (INDEPENDENT_AMBULATORY_CARE_PROVIDER_SITE_OTHER): Payer: Medicare Other | Admitting: *Deleted

## 2017-03-26 ENCOUNTER — Other Ambulatory Visit: Payer: Self-pay | Admitting: Internal Medicine

## 2017-03-26 VITALS — BP 126/68 | HR 79 | Resp 20 | Ht 66.0 in | Wt 188.0 lb

## 2017-03-26 DIAGNOSIS — Z0000 Encounter for general adult medical examination without abnormal findings: Secondary | ICD-10-CM

## 2017-03-26 DIAGNOSIS — F411 Generalized anxiety disorder: Secondary | ICD-10-CM

## 2017-03-26 MED ORDER — ALPRAZOLAM 0.5 MG PO TABS: ORAL_TABLET | ORAL | 1 refills | Status: DC

## 2017-03-26 NOTE — Telephone Encounter (Signed)
done

## 2017-03-26 NOTE — Telephone Encounter (Signed)
rx faxed to [facility].  

## 2017-03-26 NOTE — Patient Instructions (Addendum)
Continue doing brain stimulating activities (puzzles, reading, adult coloring books, staying active) to keep memory sharp.   Continue to eat heart healthy diet (full of fruits, vegetables, whole grains, lean protein, water--limit salt, fat, and sugar intake) and increase physical activity as tolerated.   [name] , Thank you for taking time to come for your Medicare Wellness Visit. I appreciate your ongoing commitment to your health goals. Please review the following plan we discussed and let me know if I can assist you in the future.   These are the goals we discussed: Goals    None      This is a list of the screening recommended for you and due dates: Lose weight, continue to exercise, monitor carbohydrates, and eat healthier.  Health Maintenance  Topic Date Due  . Flu Shot  12/13/2016  . Hemoglobin A1C  05/16/2017  . Eye exam for diabetics  07/26/2017  . Complete foot exam   09/20/2017  . Mammogram  04/17/2018  . Colon Cancer Screening  09/03/2018  . Tetanus Vaccine  03/15/2025  . DEXA scan (bone density measurement)  Completed  .  Hepatitis C: One time screening is recommended by Center for Disease Control  (CDC) for  adults born from [birth year] through 1966.   Completed  . Pneumonia vaccines  Completed      Carbohydrate Counting for Diabetes Mellitus, Adult Carbohydrate counting is a method for keeping track of how many carbohydrates you eat. Eating carbohydrates naturally increases the amount of sugar (glucose) in the blood. Counting how many carbohydrates you eat helps keep your blood glucose within normal limits, which helps you manage your diabetes (diabetes mellitus). It is important to know how many carbohydrates you can safely have in each meal. This is different for every person. A diet and nutrition specialist (registered dietitian) can help you make a meal plan and calculate how many carbohydrates you should have at each meal and snack. Carbohydrates are found in the  following foods:  Grains, such as breads and cereals.  Dried beans and soy products.  Starchy vegetables, such as potatoes, peas, and corn.  Fruit and fruit juices.  Milk and yogurt.  Sweets and snack foods, such as cake, cookies, candy, chips, and soft drinks.  How do I count carbohydrates? There are two ways to count carbohydrates in food. You can use either of the methods or a combination of both. Reading "Nutrition Facts" on packaged food The "Nutrition Facts" list is included on the labels of almost all packaged foods and beverages in the [place] It includes:  The serving size.  Information about nutrients in each serving, including the grams (g) of carbohydrate per serving.  To use the "Nutrition Facts":  Decide how many servings you will have.  Multiply the number of servings by the number of carbohydrates per serving.  The resulting number is the total amount of carbohydrates that you will be having.  Learning standard serving sizes of other foods When you eat foods containing carbohydrates that are not packaged or do not include "Nutrition Facts" on the label, you need to measure the servings in order to count the amount of carbohydrates:  Measure the foods that you will eat with a food scale or measuring cup, if needed.  Decide how many standard-size servings you will eat.  Multiply the number of servings by 15. Most carbohydrate-rich foods have about 15 g of carbohydrates per serving. ? For example, if you eat 8 oz (170 g) of strawberries, you  will have eaten 2 servings and 30 g of carbohydrates (2 servings x 15 g = 30 g).  For foods that have more than one food mixed, such as soups and casseroles, you must count the carbohydrates in each food that is included.  The following list contains standard serving sizes of common carbohydrate-rich foods. Each of these servings has about 15 g of carbohydrates:   hamburger bun or  English muffin.   oz (15 mL)  syrup.   oz (14 g) jelly.  1 slice of bread.  1 six-inch tortilla.  3 oz (85 g) cooked rice or pasta.  4 oz (113 g) cooked dried beans.  4 oz (113 g) starchy vegetable, such as peas, corn, or potatoes.  4 oz (113 g) hot cereal.  4 oz (113 g) mashed potatoes or  of a large baked potato.  4 oz (113 g) canned or frozen fruit.  4 oz (120 mL) fruit juice.  4-6 crackers.  6 chicken nuggets.  6 oz (170 g) unsweetened dry cereal.  6 oz (170 g) plain fat-free yogurt or yogurt sweetened with artificial sweeteners.  8 oz (240 mL) milk.  8 oz (170 g) fresh fruit or one small piece of fruit.  24 oz (680 g) popped popcorn.  Example of carbohydrate counting Sample meal  3 oz (85 g) chicken breast.  6 oz (170 g) brown rice.  4 oz (113 g) corn.  8 oz (240 mL) milk.  8 oz (170 g) strawberries with sugar-free whipped topping. Carbohydrate calculation 1. Identify the foods that contain carbohydrates: ? Rice. ? Corn. ? Milk. ? Strawberries. 2. Calculate how many servings you have of each food: ? 2 servings rice. ? 1 serving corn. ? 1 serving milk. ? 1 serving strawberries. 3. Multiply each number of servings by 15 g: ? 2 servings rice x 15 g = 30 g. ? 1 serving corn x 15 g = 15 g. ? 1 serving milk x 15 g = 15 g. ? 1 serving strawberries x 15 g = 15 g. 4. Add together all of the amounts to find the total grams of carbohydrates eaten: ? 30 g + 15 g + 15 g + 15 g = 75 g of carbohydrates total. This information is not intended to replace advice given to you by your health care provider. Make sure you discuss any questions you have with your health care provider. Document Released: 05/01/2005 Document Revised: 11/19/2015 Document Reviewed: 10/13/2015 Elsevier Interactive Patient Education  2018 [place] Content in Foods Generally, most healthy people need around 50 grams of protein each day. Depending on your overall health, you may need more or less  protein in your diet. Talk to your health care provider or dietitian about how much protein you need. See the following list for the protein content of some common foods. High-protein foods High-protein foods contain 4 grams (4 g) or more of protein per serving. They include:  Beef, ground sirloin (cooked) - 3 oz have 24 g of protein.  Cheese (hard) - 1 oz has 7 g of protein.  Chicken breast, boneless and skinless (cooked) - 3 oz have 13.4 g of protein.  Cottage cheese - 1/2 cup has 13.4 g of protein.  Egg - 1 egg has 6 g of protein.  Fish, filet (cooked) - 1 oz has 6-7 g of protein.  Garbanzo beans (canned or cooked) - 1/2 cup has 6-7 g of protein.  Kidney beans (canned or cooked) -  1/2 cup has 6-7 g of protein.  Lamb (cooked) - 3 oz has 24 g of protein.  Milk - 1 cup (8 oz) has 8 g of protein.  Nuts (peanuts, pistachios, almonds) - 1 oz has 6 g of protein.  Peanut butter - 1 oz has 7-8 g of protein.  Pork tenderloin (cooked) - 3 oz has 18.4 g of protein.  Pumpkin seeds - 1 oz has 8.5 g of protein.  Soybeans (roasted) - 1 oz has 8 g of protein.  Soybeans (cooked) - 1/2 cup has 11 g of protein.  Soy milk - 1 cup (8 oz) has 5-10 g of protein.  Soy or vegetable patty - 1 patty has 11 g of protein.  Sunflower seeds - 1 oz has 5.5 g of protein.  Tofu (firm) - 1/2 cup has 20 g of protein.  Tuna (canned in water) - 3 oz has 20 g of protein.  Yogurt - 6 oz has 8 g of protein.  Low-protein foods Low-protein foods contain 3 grams (3 g) or less of protein per serving. They include:  Beets (raw or cooked) - 1/2 cup has 1.5 g of protein.  Bran cereal - 1/2 cup has 2-3 g of protein.  Bread - 1 slice has 2.5 g of protein.  Broccoli (raw or cooked) - 1/2 cup has 2 g of protein.  Collard greens (raw or cooked) - 1/2 cup has 2 g of protein.  Corn (fresh or cooked) - 1/2 cup has 2 g of protein.  Cream cheese - 1 oz has 2 g of protein.  Creamer (half-and-half) - 1 oz  has 1 g of protein.  Flour tortilla - 1 tortilla has 2.5 g of protein  Frozen yogurt - 1/2 cup has 3 g of protein.  Fruit or vegetable juice - 1/2 cup has 1 g of protein.  Green beans (raw or cooked) - 1/2 cup has 1 g of protein.  Green peas (canned) - 1/2 cup has 3.5 g of protein.  Muffins - 1 small muffin (2 oz) has 3 g of protein.  Oatmeal (cooked) - 1/2 cup has 3 g of protein.  Potato (baked with skin) - 1 medium potato has 3 g of protein.  Rice (cooked) - 1/2 cup has 2.5-3.5 g of protein.  Sour cream - 1/2 cup has 2.5 g of protein.  Spinach (cooked) - 1/2 cup has 3 g of protein.  Squash (cooked) - 1/2 cup has 1.5 g of protein.  Actual amounts of protein may be different depending on processing. Talk with your health care provider or dietitian about what foods are recommended for you. This information is not intended to replace advice given to you by your health care provider. Make sure you discuss any questions you have with your health care provider. Document Released: 07/31/2015 Document Revised: 01/10/2016 Document Reviewed: 01/10/2016 Elsevier Interactive Patient Education  [employer].

## 2017-03-27 ENCOUNTER — Other Ambulatory Visit: Payer: Self-pay | Admitting: Internal Medicine

## 2017-03-27 ENCOUNTER — Telehealth: Payer: Self-pay | Admitting: Internal Medicine

## 2017-03-27 NOTE — Telephone Encounter (Signed)
Per chart MD assistant faxed to [facility] on 11/12...Katie [name]

## 2017-03-27 NOTE — Telephone Encounter (Signed)
Pt called in need refill on her ALPRAZolam Duanne Moron) 0.5 MG tablet [[id number]]    Pharmacy - [facility]

## 2017-03-27 NOTE — Telephone Encounter (Signed)
Check [place] registry last filled 01/03/2017...Johny Chess

## 2017-03-27 NOTE — Telephone Encounter (Signed)
This prescription was written yesterday

## 2017-04-01 ENCOUNTER — Other Ambulatory Visit: Payer: Self-pay | Admitting: Internal Medicine

## 2017-04-01 DIAGNOSIS — I10 Essential (primary) hypertension: Secondary | ICD-10-CM

## 2017-04-18 LAB — HM MAMMOGRAPHY

## 2017-04-19 ENCOUNTER — Encounter: Payer: Self-pay | Admitting: Internal Medicine

## 2017-04-26 ENCOUNTER — Other Ambulatory Visit: Payer: Self-pay

## 2017-05-18 LAB — TSH: TSH: 0.84 (ref 0.41–5.90)

## 2017-05-18 LAB — BASIC METABOLIC PANEL
BUN: 16 (ref 4–21)
Creatinine: 1.2 — AB (ref 0.5–1.1)
Glucose: 105
Potassium: 4.4 (ref 3.4–5.3)
Sodium: 140 (ref 137–147)

## 2017-05-18 LAB — LIPID PANEL
Cholesterol: 127 (ref 0–200)
HDL: 65 (ref 35–70)
LDL Cholesterol: 27
Triglycerides: 77 (ref 40–160)

## 2017-05-18 LAB — MICROALBUMIN, URINE: Microalb, Ur: 0

## 2017-05-18 LAB — HEMOGLOBIN A1C: Hemoglobin A1C: 5.6

## 2017-05-21 ENCOUNTER — Ambulatory Visit: Payer: Medicare Other | Admitting: Internal Medicine

## 2017-05-21 ENCOUNTER — Encounter: Payer: Self-pay | Admitting: Internal Medicine

## 2017-05-21 VITALS — BP 128/80 | HR 83 | Temp 98.3°F | Resp 16 | Ht 66.0 in | Wt 184.1 lb

## 2017-05-21 DIAGNOSIS — M79604 Pain in right leg: Secondary | ICD-10-CM

## 2017-05-21 DIAGNOSIS — G5711 Meralgia paresthetica, right lower limb: Secondary | ICD-10-CM | POA: Diagnosis not present

## 2017-05-21 NOTE — Progress Notes (Signed)
Subjective:  Patient ID: Katie [name], female    DOB: 11-Jan-1946  Age: 72 y.o. MRN: [id number]  CC: Hypothyroidism   HPI Katie [name] presents for f/up - she continues to complain of pain in her right lower extremity.  She has been seen by sports medicine and neurology.  She tells me her nerve conduction study was within normal limits.  She had arterial flow studies which were normal.  Most of her symptoms are isolated to the right lateral/anterior/thigh where she describes a burning, stabbing sensation intermittently.  She also has intermittent numbness in her right foot.  She has been prescribed gabapentin which helps some.  She continues to be bothered by the symptoms.  The symptoms occur mostly during the day with activity.  There are no symptoms that bother her at night.  She specifically denies claudication in her lower extremities.  She has some low back pathology but does not complain much of low back pain and has no pain that radiates from the back into the right hip or thigh.  Outpatient Medications Prior to Visit  Medication Sig Dispense Refill  . ACCU-CHEK AVIVA PLUS test strip     . ALPRAZolam (XANAX) 0.5 MG tablet TAKE ONE TABLET BY MOUTH THREE TIMES DAILY AS NEEDED FOR ANXIETY OR SLEEP 270 tablet 1  . aspirin 81 MG EC tablet Take 81 mg by mouth daily.      . Blood Glucose Calibration (ACCU-CHEK GUIDE CONTROL) LIQD     . Blood Glucose Monitoring Suppl (ACCU-CHEK AVIVA PLUS) W/DEVICE KIT     . cyanocobalamin (,VITAMIN B-12,) 1000 MCG/ML injection 1000 mcg subq once a month 30 mL 1  . ergocalciferol (VITAMIN D2) 50000 UNITS capsule Take 50,000 Units by mouth once a week.      . furosemide (LASIX) 20 MG tablet TAKE 1 TABLET BY MOUTH ONCE DAILY 90 tablet 1  . gabapentin (NEURONTIN) 300 MG capsule Take 1 capsule (300 mg total) by mouth at bedtime. 90 capsule 1  . [employer] (SIMPLE DIAGNOSTICS LANCING DEV) MISC     . levothyroxine (SYNTHROID, LEVOTHROID) 88 MCG tablet Take 88 mcg by  mouth daily.      Marland Kitchen linaclotide (LINZESS) 72 MCG capsule Take 1 capsule (72 mcg total) by mouth daily before breakfast. 90 capsule 1  . lisinopril (PRINIVIL,ZESTRIL) 10 MG tablet Take 1 tablet (10 mg total) by mouth daily. Yearly physical due in sept must see Md for refills 90 tablet 3  . rosuvastatin (CRESTOR) 20 MG tablet Take 1/2 daily     . vitamin E 100 UNIT capsule Take 400 Units by mouth daily.    . Dulaglutide (TRULICITY) 1.5 KZ/6.0FU SOPN Inject 1.5 mg once a week for diabetes     No facility-administered medications prior to visit.     ROS Review of Systems  Constitutional: Negative.   HENT: Negative.   Eyes: Negative.   Respiratory: Negative.  Negative for cough and shortness of breath.   Cardiovascular: Negative for palpitations and leg swelling.  Gastrointestinal: Negative for abdominal pain, constipation, diarrhea, nausea and vomiting.  Endocrine: Negative.   Genitourinary: Negative.  Negative for difficulty urinating, dysuria and hematuria.  Musculoskeletal: Positive for back pain and myalgias.  Skin: Negative.   Allergic/Immunologic: Negative.   Neurological: Positive for numbness. Negative for dizziness and weakness.  Hematological: Negative for adenopathy. Does not bruise/bleed easily.  Psychiatric/Behavioral: Negative.     Objective:  BP 128/80 (BP Location: Right Arm, Patient Position: Sitting, Cuff Size: Normal)  Pulse 83   Temp 98.3 F (36.8 C) (Oral)   Resp 16   Ht _0  (1.676 m)   Wt 184 lb 1.3 oz (83.5 kg)   SpO2 99%   BMI 29.71 kg/m   BP Readings from Last 3 Encounters:  05/21/17 128/80  03/26/17 126/68  12/18/16 100/70    Wt Readings from Last 3 Encounters:  05/21/17 184 lb 1.3 oz (83.5 kg)  03/26/17 188 lb (85.3 kg)  12/18/16 186 lb 2 oz (84.4 kg)    Physical Exam  Constitutional: She is oriented to person, place, and time. No distress.  HENT:  Mouth/Throat: Oropharynx is clear and moist. No oropharyngeal exudate.  Eyes:  Conjunctivae are normal. Left eye exhibits no discharge. No scleral icterus.  Neck: Normal range of motion. Neck supple. No JVD present. No thyromegaly present.  Cardiovascular: Normal rate, regular rhythm and normal heart sounds.  No murmur heard. Pulses:      Carotid pulses are 1+ on the right side, and 1+ on the left side.      Radial pulses are 2+ on the right side, and 2+ on the left side.       Femoral pulses are 1+ on the right side, and 1+ on the left side.      Popliteal pulses are 0 on the right side, and 0 on the left side.       Dorsalis pedis pulses are 1+ on the right side, and 1+ on the left side.       Posterior tibial pulses are 1+ on the right side, and 1+ on the left side.  Pulmonary/Chest: Effort normal and breath sounds normal. No respiratory distress. She has no wheezes. She has no rales.  Abdominal: Soft. Bowel sounds are normal. She exhibits no distension and no mass. There is no tenderness. There is no guarding.  Musculoskeletal: Normal range of motion. She exhibits no edema, tenderness or deformity.       Right hip: Normal.       Right knee: Normal.       Right upper leg: Normal. She exhibits no tenderness, no bony tenderness, no swelling, no edema, no deformity and no laceration.  Lymphadenopathy:    She has no cervical adenopathy.  Neurological: She is alert and oriented to person, place, and time. She has normal reflexes. She displays no atrophy, no tremor and normal reflexes. She exhibits normal muscle tone. Coordination and gait normal.  Skin: Skin is warm and dry. No rash noted. She is not diaphoretic. No erythema. No pallor.  Vitals reviewed.   Lab Results  Component Value Date   WBC 4.6 12/13/2015   HGB 13.3 12/13/2015   HCT 38.9 12/13/2015   PLT 218.0 12/13/2015   GLUCOSE 100 (H) 12/13/2015   CHOL 127 05/18/2017   TRIG 77 05/18/2017   HDL 65 05/18/2017   LDLDIRECT 105.9 07/04/2007   LDLCALC 27 05/18/2017   ALT 16 11/13/2016   AST 30 11/13/2016     NA 140 05/18/2017   K 4.4 05/18/2017   CL 104 12/13/2015   CREATININE 1.2 (A) 05/18/2017   BUN 16 05/18/2017   CO2 33 (H) 12/13/2015   TSH 0.84 05/18/2017   HGBA1C 5.6 05/18/2017   MICROALBUR 0.0 05/18/2017    No results found.  Assessment & Plan:   Katie [name] was seen today for hypothyroidism.  Diagnoses and all orders for this visit:  Leg pain, anterior, right- she wants to continue gabapentin at the current dose. -  Cancel: VAS [name] ABI WITH/WO TBI; Future -     Ambulatory referral to Physical Therapy  Meralgia paresthetica, right- I am somewhat baffled by her symptoms and normal workup.  I think the best thing to do is to treat her for meralgia paresthetica so I have referred her to physical therapy. -     Ambulatory referral to Physical Therapy   I have discontinued Katie [name]'s Dulaglutide. I am also having her maintain her rosuvastatin, aspirin, levothyroxine, ergocalciferol, vitamin E, ACCU-CHEK AVIVA PLUS, ACCU-CHEK AVIVA PLUS, SIMPLE DIAGNOSTICS LANCING DEV, lisinopril, gabapentin, linaclotide, cyanocobalamin, ALPRAZolam, furosemide, and ACCU-CHEK GUIDE CONTROL.  No orders of the defined types were placed in this encounter.    Follow-up: Return in about 4 weeks (around 06/18/2017).  Katie Calico, MD

## 2017-05-21 NOTE — Patient Instructions (Signed)
Peripheral Vascular Disease Peripheral vascular disease (PVD) is a disease of the blood vessels that are not part of your heart and brain. A simple term for PVD is poor circulation. In most cases, PVD narrows the blood vessels that carry blood from your heart to the rest of your body. This can result in a decreased supply of blood to your arms, legs, and internal organs, like your stomach or kidneys. However, it most often affects a person's lower legs and feet. There are two types of PVD.  Organic PVD. This is the more common type. It is caused by damage to the structure of blood vessels.  Functional PVD. This is caused by conditions that make blood vessels contract and tighten (spasm).  Without treatment, PVD tends to get worse over time. PVD can also lead to acute ischemic limb. This is when an arm or limb suddenly has trouble getting enough blood. This is a medical emergency. What are the causes? Each type of PVD has many different causes. The most common cause of PVD is buildup of a fatty material (plaque) inside of your arteries (atherosclerosis). Small amounts of plaque can break off from the walls of the blood vessels and become lodged in a smaller artery. This blocks blood flow and can cause acute ischemic limb. Other common causes of PVD include:  Blood clots that form inside of blood vessels.  Injuries to blood vessels.  Diseases that cause inflammation of blood vessels or cause blood vessel spasms.  Health behaviors and health history that increase your risk of developing PVD.  What increases the risk? You may have a greater risk of PVD if you:  Have a family history of PVD.  Have certain medical conditions, including: ? High cholesterol. ? Diabetes. ? High blood pressure (hypertension). ? Coronary heart disease. ? Past problems with blood clots. ? Past injury, such as burns or a broken bone. These may have damaged blood vessels in your limbs. ? Buerger disease. This is  caused by inflamed blood vessels in your hands and feet. ? Some forms of arthritis. ? Rare birth defects that affect the arteries in your legs.  Use tobacco.  Do not get enough exercise.  Are obese.  Are age 50 or older.  What are the signs or symptoms? PVD may cause many different symptoms. Your symptoms depend on what part of your body is not getting enough blood. Some common signs and symptoms include:  Cramps in your lower legs. This may be a symptom of poor leg circulation (claudication).  Pain and weakness in your legs while you are physically active that goes away when you rest (intermittent claudication).  Leg pain when at rest.  Leg numbness, tingling, or weakness.  Coldness in a leg or foot, especially when compared with the other leg.  Skin or hair changes. These can include: ? Hair loss. ? Shiny skin. ? Pale or bluish skin. ? Thick toenails.  Inability to get or maintain an erection (erectile dysfunction).  People with PVD are more prone to developing ulcers and sores on their toes, feet, or legs. These may take longer than normal to heal. How is this diagnosed? Your health care provider may diagnose PVD from your signs and symptoms. The health care provider will also do a physical exam. You may have tests to find out what is causing your PVD and determine its severity. Tests may include:  Blood pressure recordings from your arms and legs and measurements of the strength of your pulses (  pulse volume recordings).  Imaging studies using sound waves to take pictures of the blood flow through your blood vessels (Doppler ultrasound).  Injecting a dye into your blood vessels before having imaging studies using: ? X-rays (angiogram or arteriogram). ? Computer-generated X-rays (CT angiogram). ? A powerful electromagnetic field and a computer (magnetic resonance angiogram or MRA).  How is this treated? Treatment for PVD depends on the cause of your condition and the  severity of your symptoms. It also depends on your age. Underlying causes need to be treated and controlled. These include long-lasting (chronic) conditions, such as diabetes, high cholesterol, and high blood pressure. You may need to first try making lifestyle changes and taking medicines. Surgery may be needed if these do not work. Lifestyle changes may include:  Quitting smoking.  Exercising regularly.  Following a low-fat, low-cholesterol diet.  Medicines may include:  Blood thinners to prevent blood clots.  Medicines to improve blood flow.  Medicines to improve your blood cholesterol levels.  Surgical procedures may include:  A procedure that uses an inflated balloon to open a blocked artery and improve blood flow (angioplasty).  A procedure to put in a tube (stent) to keep a blocked artery open (stent implant).  Surgery to reroute blood flow around a blocked artery (peripheral bypass surgery).  Surgery to remove dead tissue from an infected wound on the affected limb.  Amputation. This is surgical removal of the affected limb. This may be necessary in cases of acute ischemic limb that are not improved through medical or surgical treatments.  Follow these instructions at home:  Take medicines only as directed by your health care provider.  Do not use any tobacco products, including cigarettes, chewing tobacco, or electronic cigarettes. If you need help quitting, ask your health care provider.  Lose weight if you are overweight, and maintain a healthy weight as directed by your health care provider.  Eat a diet that is low in fat and cholesterol. If you need help, ask your health care provider.  Exercise regularly. Ask your health care provider to suggest some good activities for you.  Use compression stockings or other mechanical devices as directed by your health care provider.  Take good care of your feet. ? Wear comfortable shoes that fit well. ? Check your feet  often for any cuts or sores. Contact a health care provider if:  You have cramps in your legs while walking.  You have leg pain when you are at rest.  You have coldness in a leg or foot.  Your skin changes.  You have erectile dysfunction.  You have cuts or sores on your feet that are not healing. Get help right away if:  Your arm or leg turns cold and blue.  Your arms or legs become red, warm, swollen, painful, or numb.  You have chest pain or trouble breathing.  You suddenly have weakness in your face, arm, or leg.  You become very confused or lose the ability to speak.  You suddenly have a very bad headache or lose your vision. This information is not intended to replace advice given to you by your health care provider. Make sure you discuss any questions you have with your health care provider. Document Released: 06/08/2004 Document Revised: 10/07/2015 Document Reviewed: 10/09/2013 Elsevier Interactive Patient Education  2017 [employer]  

## 2017-06-10 ENCOUNTER — Other Ambulatory Visit: Payer: Self-pay | Admitting: Internal Medicine

## 2017-06-10 DIAGNOSIS — I10 Essential (primary) hypertension: Secondary | ICD-10-CM

## 2017-06-15 ENCOUNTER — Encounter: Payer: Self-pay | Admitting: Internal Medicine

## 2017-06-18 ENCOUNTER — Ambulatory Visit: Payer: Medicare Other | Admitting: Internal Medicine

## 2017-06-18 ENCOUNTER — Encounter: Payer: Self-pay | Admitting: Internal Medicine

## 2017-06-18 VITALS — BP 140/90 | HR 70 | Temp 98.1°F | Resp 16 | Ht 66.0 in | Wt 187.2 lb

## 2017-06-18 DIAGNOSIS — E785 Hyperlipidemia, unspecified: Secondary | ICD-10-CM | POA: Diagnosis not present

## 2017-06-18 DIAGNOSIS — G5711 Meralgia paresthetica, right lower limb: Secondary | ICD-10-CM | POA: Diagnosis not present

## 2017-06-18 DIAGNOSIS — I10 Essential (primary) hypertension: Secondary | ICD-10-CM | POA: Diagnosis not present

## 2017-06-18 DIAGNOSIS — Z23 Encounter for immunization: Secondary | ICD-10-CM

## 2017-06-18 NOTE — Patient Instructions (Signed)

## 2017-06-18 NOTE — Progress Notes (Signed)
Subjective:  Patient ID: Katie [name], female    DOB: July 07, 1945  Age: 72 y.o. MRN: [id number]  CC: Hyperlipidemia   HPI Ariatna Jester presents for f/up - She continues to complain of a strange sensations in her right anterior and lateral thigh.  She describes it as an intermittent prickly and burning sensation.  She saw a podiatrist and was diagnosed with Morton's neuroma in the right foot.  She has a history of low back pain but the back has not been bothering her recently.  She has been investigated with nerve conduction studies and ABIs both of which were unremarkable.  She also complains of random muscle and joint aches and wants to stop taking the statin.  Outpatient Medications Prior to Visit  Medication Sig Dispense Refill  . ACCU-CHEK AVIVA PLUS test strip     . ALPRAZolam (XANAX) 0.5 MG tablet TAKE ONE TABLET BY MOUTH THREE TIMES DAILY AS NEEDED FOR ANXIETY OR SLEEP 270 tablet 1  . aspirin 81 MG EC tablet Take 81 mg by mouth daily.      . Blood Glucose Calibration (ACCU-CHEK GUIDE CONTROL) LIQD     . Blood Glucose Monitoring Suppl (ACCU-CHEK AVIVA PLUS) W/DEVICE KIT     . cyanocobalamin (,VITAMIN B-12,) 1000 MCG/ML injection 1000 mcg subq once a month 30 mL 1  . ergocalciferol (VITAMIN D2) 50000 UNITS capsule Take 50,000 Units by mouth once a week.      . furosemide (LASIX) 20 MG tablet TAKE 1 TABLET BY MOUTH ONCE DAILY 90 tablet 1  . gabapentin (NEURONTIN) 300 MG capsule Take 1 capsule (300 mg total) by mouth at bedtime. 90 capsule 1  . Lanc[employer] Devices (SIMPLE DIAGNOSTICS LANCING DEV) MISC     . levothyroxine (SYNTHROID, LEVOTHROID) 88 MCG tablet Take 88 mcg by mouth daily.      Marland Kitchen linaclotide (LINZESS) 72 MCG capsule Take 1 capsule (72 mcg total) by mouth daily before breakfast. 90 capsule 1  . lisinopril (PRINIVIL,ZESTRIL) 10 MG tablet TAKE 1 TABLET BY MOUTH  DAILY 90 tablet 1  . TRULICITY 1.5 EY/8.1KG SOPN     . vitamin E 100 UNIT capsule Take 400 Units by mouth daily.    .  rosuvastatin (CRESTOR) 20 MG tablet Take 1/2 daily      No facility-administered medications prior to visit.     ROS Review of Systems  Constitutional: Negative for appetite change, diaphoresis, fatigue and unexpected weight change.  HENT: Negative.  Negative for trouble swallowing.   Eyes: Negative for visual disturbance.  Respiratory: Negative for cough, chest tightness, shortness of breath and wheezing.   Cardiovascular: Negative for chest pain and leg swelling.  Gastrointestinal: Negative for abdominal pain, constipation, diarrhea, nausea and vomiting.  Genitourinary: Negative.  Negative for difficulty urinating.  Musculoskeletal: Positive for arthralgias and myalgias. Negative for neck pain.  Skin: Negative.  Negative for color change and rash.  Neurological: Negative.  Negative for dizziness, weakness, light-headedness, numbness and headaches.  Hematological: Negative for adenopathy. Does not bruise/bleed easily.  Psychiatric/Behavioral: Negative.     Objective:  BP 140/90 (BP Location: Left Arm, Patient Position: Sitting, Cuff Size: Normal)   Pulse 70   Temp 98.1 F (36.7 C) (Oral)   Resp 16   Ht 5' 6"  (1.676 m)   Wt 187 lb 4 oz (84.9 kg)   SpO2 98%   BMI 30.22 kg/m   BP Readings from Last 3 Encounters:  06/18/17 140/90  05/21/17 128/80  03/26/17 126/68    Wt  Readings from Last 3 Encounters:  06/18/17 187 lb 4 oz (84.9 kg)  05/21/17 184 lb 1.3 oz (83.5 kg)  03/26/17 188 lb (85.3 kg)    Physical Exam  Constitutional: She is oriented to person, place, and time. No distress.  HENT:  Mouth/Throat: Oropharynx is clear and moist. No oropharyngeal exudate.  Eyes: Conjunctivae are normal. Left eye exhibits no discharge. No scleral icterus.  Neck: Normal range of motion. Neck supple. No JVD present. No thyromegaly present.  Cardiovascular: Normal rate, regular rhythm and normal heart sounds. Exam reveals no gallop.  No murmur heard. Pulmonary/Chest: Effort normal  and breath sounds normal. No respiratory distress. She has no wheezes. She has no rales.  Abdominal: Soft. Bowel sounds are normal. She exhibits no distension and no mass.  Musculoskeletal: Normal range of motion. She exhibits no edema, tenderness or deformity.  Lymphadenopathy:    She has no cervical adenopathy.  Neurological: She is alert and oriented to person, place, and time.  Skin: Skin is warm and dry. No rash noted. She is not diaphoretic. No erythema. No pallor.  Vitals reviewed.   Lab Results  Component Value Date   WBC 4.6 12/13/2015   HGB 13.3 12/13/2015   HCT 38.9 12/13/2015   PLT 218.0 12/13/2015   GLUCOSE 100 (H) 12/13/2015   CHOL 127 05/18/2017   TRIG 77 05/18/2017   HDL 65 05/18/2017   LDLDIRECT 105.9 07/04/2007   LDLCALC 27 05/18/2017   ALT 16 11/13/2016   AST 30 11/13/2016   NA 140 05/18/2017   K 4.4 05/18/2017   CL 104 12/13/2015   CREATININE 1.2 (A) 05/18/2017   BUN 16 05/18/2017   CO2 33 (H) 12/13/2015   TSH 0.84 05/18/2017   HGBA1C 5.6 05/18/2017   MICROALBUR 0.0 05/18/2017    No results found.  Assessment & Plan:   Ambrosia was seen today for hyperlipidemia.  Diagnoses and all orders for this visit:  Meralgia paresthetica, right- I have asked her to start PT to treat this. -     Ambulatory referral to Physical Therapy  Need for pneumococcal vaccination -     Pneumococcal polysaccharide vaccine 23-valent greater than or equal to 2yo subcutaneous/IM  Hyperlipidemia with target LDL less than 100- At her request will discontinue the statin.  I have asked her to return in about 6 months for me to recheck her lipids and will reevaluate at that time.  Essential hypertension- Her blood pressure is adequately well controlled.   I have discontinued Leonore Haralson's rosuvastatin. I am also having her maintain her aspirin, levothyroxine, ergocalciferol, vitamin E, ACCU-CHEK AVIVA PLUS, ACCU-CHEK AVIVA PLUS, SIMPLE DIAGNOSTICS LANCING DEV, gabapentin,  linaclotide, cyanocobalamin, ALPRAZolam, furosemide, ACCU-CHEK GUIDE CONTROL, lisinopril, and TRULICITY.  No orders of the defined types were placed in this encounter.    Follow-up: Return in about 6 months (around 12/16/2017).  Scarlette Calico, MD

## 2017-06-21 ENCOUNTER — Other Ambulatory Visit: Payer: Self-pay

## 2017-06-21 ENCOUNTER — Ambulatory Visit: Payer: Medicare Other | Attending: Internal Medicine

## 2017-06-21 DIAGNOSIS — R2681 Unsteadiness on feet: Secondary | ICD-10-CM | POA: Insufficient documentation

## 2017-06-21 DIAGNOSIS — M25651 Stiffness of right hip, not elsewhere classified: Secondary | ICD-10-CM | POA: Insufficient documentation

## 2017-06-21 DIAGNOSIS — M79651 Pain in right thigh: Secondary | ICD-10-CM

## 2017-06-21 NOTE — Patient Instructions (Signed)
Issued for home knee to chest and with adduction 30 sec x 2-3 reps  2x/day

## 2017-06-21 NOTE — Therapy (Signed)
[place], [place], [address] Phone: [phone]   Fax:  [phone]  Physical Therapy Evaluation  Patient Details  Name: Katie [name] MRN: [id number] Date of Birth: 25-Jun-1945 Referring Provider: Scarlette [name].  MD   Encounter Date: 06/21/2017  PT End of Session - 06/21/17 1201    Visit Number  1    Number of Visits  9    Date for PT Re-Evaluation  07/20/17    Authorization Type  [employer] MCR    PT Start Time  2979    PT Stop Time  1225    PT Time Calculation (min)  40 min    Activity Tolerance  Treatment limited secondary to medical complications (Comment)    Behavior During Therapy  [facility] for tasks assessed/performed       Past Medical History:  Diagnosis Date  . Acute venous embolism and thrombosis of unspecified deep vessels of lower extremity 2001   RLE DVT and PE, coumadin x 64mo, now ASSA  . Anxiety state, unspecified   . Degeneration of cervical intervertebral disc   . History of DVT (deep vein thrombosis)   . Iron deficiency anemia secondary to blood loss (chronic)   . Irritable bowel syndrome   . Lumbago   . Other B-complex deficiencies   . Personal history of colonic polyps   . Pure hypercholesterolemia   . Reflux esophagitis   . Type II or unspecified type diabetes mellitus without mention of complication, not stated as uncontrolled   . Unspecified essential hypertension   . Unspecified hypothyroidism   . Unspecified iridocyclitis   . Unspecified venous (peripheral) insufficiency   . Unspecified vitamin D deficiency     Past Surgical History:  Procedure Laterality Date  . CARPAL TUNNEL RELEASE    . FEMORAL HERNIA REPAIR    . OOPHORECTOMY    . VESICOVAGINAL FISTULA CLOSURE W/ TAH      There were no vitals filed for this visit.   Subjective Assessment - 06/21/17 1151    Subjective  She reports pain /senstion of stinging and crawling in RT lateral  thigh. Makes feel like off balance  with foot not straight .  Started  a year or more. ball of RT foot numb    Pertinent History  pelvic fracture , OA spine   DDD spine    Limitations  Walking    How long can you sit comfortably?  As needed    How long can you stand comfortably?  As needed    How long can you walk comfortably?  As needed    Diagnostic tests  NCV:   negative    Patient Stated Goals  Not sure, decr symptoms    Currently in Pain?  No/denies    Pain Location  -- thigh    Pain Orientation  Right    Pain Descriptors / Indicators  Burning;Tingling    Pain Type  Chronic pain    Pain Onset  More than a month ago    Pain Frequency  Intermittent shor periods    Aggravating Factors   nothing    Pain Relieving Factors  position chnag  , rub    Multiple Pain Sites  No         [facility] PT Assessment - 06/21/17 0001      Assessment   Medical Diagnosis  meraglia parasthetica    Referring Provider  Katie [name].  MD    Onset Date/Surgical Date  --  a year or more    Next MD Visit  As needed    Prior Therapy  No      Precautions   Precautions  None      Restrictions   Weight Bearing Restrictions  No      Prior Function   Level of Independence  Independent      Cognition   Overall Cognitive Status  Within Functional Limits for tasks assessed      Observation/Other Assessments   Focus on Therapeutic Outcomes (FOTO)   38% limited      Posture/Postural Control   Posture Comments  flat lumbar spine      ROM / Strength   AROM / PROM / Strength  AROM;Strength;PROM      AROM   Overall AROM Comments  Lumbar spine WFL      PROM   PROM Assessment Site  Hip    Right/Left Hip  Right;Left    Right Hip External Rotation   43    Right Hip Internal Rotation   18    Left Hip Flexion  120    Left Hip External Rotation   45    Left Hip Internal Rotation   25    Left Hip ADduction  30      Strength   Overall Strength Comments  WNL      Right Hip   Right Hip Flexion  120    Right Hip ADduction  20       Flexibility   Soft Tissue Assessment /Muscle Length  yes    Hamstrings  75 Degrees bilaterally      Ambulation/Gait   Gait Comments  Normal             Objective measurements completed on examination: See above findings.                   PT Long Term Goals - 06/21/17 1248      PT LONG TERM GOAL #1   Title  She will be independent with all HEp issued    Time  4    Period  Weeks    Status  New      PT LONG TERM GOAL #2   Title  she will report Rt thigh symtoms decreasd 50% or more     Time  4    Period  Weeks    Status  New      PT LONG TERM GOAL #3   Title  FOTO score will improve to  30% limited or better    to demo improved function.     Time  4    Period  Weeks    Status  New      PT LONG TERM GOAL #4   Title  BERG score will improve by 3-5 points to demo improved balance    Time  4    Period  Weeks    Status  New             Plan - 06/21/17 1251    Clinical Impression Statement  Katie [name] presents for pain and tingle/burn in RT lateral thigh with onset over a year ago.  The symptom really dont' change with activity . Sometimes changing position . The symptoms resolve for very brief periods without specific cause. She is not limited but feels less steady with low high heels.     History and Personal Factors relevant to plan of care:  morton's neuroma  RT     Clinical Presentation  Stable    Clinical Decision Making  Low    Rehab Potential  Good    PT Frequency  2x / week    PT Duration  4 weeks    PT Treatment/Interventions  Electrical Stimulation;Iontophoresis 4mg /ml Dexamethasone;Ultrasound;Moist Heat;Therapeutic exercise;Manual techniques;Passive range of motion;Patient/family education    PT Next Visit Plan  REveiw stretch HEP ,   BERG TEST,     quad stretching , manual, modalities  Add to HEP    PT Home Exercise Plan  knee to RT and LT shoulder stretch    Consulted and Agree with Plan of Care  Patient       Patient will benefit  from skilled therapeutic intervention in order to improve the following deficits and impairments:  Pain, Postural dysfunction, Decreased balance, Decreased range of motion  Visit Diagnosis: Pain in right thigh  Stiffness of right hip, not elsewhere classified  Unsteadiness on feet     Problem List Patient Active Problem List   Diagnosis Date Noted  . Benign skin lesion of face 11/02/2016  . Chronic idiopathic constipation 11/02/2016  . Obesity, Class I, BMI 30.0-34.9 (see actual BMI) 09/20/2016  . Spinal stenosis, lumbar region with neurogenic claudication 06/28/2016  . Meralgia paresthetica, right 05/30/2016  . Hyperglycemia 11/27/2012  . Vitamin D deficiency 03/05/2008  . Hyperlipidemia with target LDL less than 100 11/16/2007  . Hypothyroidism 04/03/2007  . B12 deficiency 04/03/2007  . GAD (generalized anxiety disorder) 04/03/2007  . PSEUDOTUMOR CEREBRI 04/03/2007  . CARPAL TUNNEL SYNDROME, BILATERAL 04/03/2007  . IRITIS 04/03/2007  . Essential hypertension 04/03/2007  . History of DVT of lower extremity 04/03/2007  . Venous (peripheral) insufficiency 04/03/2007  . REFLUX ESOPHAGITIS 04/03/2007  . IRRITABLE BOWEL SYNDROME 04/03/2007  . [place] DISEASE, CERVICAL SPINE 04/03/2007    Darrel Hoover  PT 06/21/2017, 1:00 PM  [facility] [address] [place], [place], [address] Phone: [phone]   Fax:  [phone]  Name: Katie [name] MRN: [id number] Date of Birth: 1945-07-24

## 2017-06-26 ENCOUNTER — Ambulatory Visit: Payer: Medicare Other | Admitting: Physical Therapy

## 2017-06-26 DIAGNOSIS — M79651 Pain in right thigh: Secondary | ICD-10-CM

## 2017-06-26 DIAGNOSIS — R2681 Unsteadiness on feet: Secondary | ICD-10-CM

## 2017-06-26 DIAGNOSIS — M25651 Stiffness of right hip, not elsewhere classified: Secondary | ICD-10-CM

## 2017-06-26 NOTE — Therapy (Addendum)
[place], [place], [address] Phone: [phone]   Fax:  [phone]  Physical Therapy Treatment  Patient Details  Name: Katie [name] MRN: [id number] Date of Birth: 06/12/1945 Referring Provider: Scarlette Calico.  MD   Encounter Date: 06/26/2017  PT End of Session - 06/26/17 0904    Visit Number  2    Number of Visits  9    Date for PT Re-Evaluation  07/20/17    Authorization Type  [employer] MCR    PT Start Time  0845    PT Stop Time  0925    PT Time Calculation (min)  40 min       Past Medical History:  Diagnosis Date  . Acute venous embolism and thrombosis of unspecified deep vessels of lower extremity 2001   RLE DVT and PE, coumadin x 61mo, now ASSA  . Anxiety state, unspecified   . Degeneration of cervical intervertebral disc   . History of DVT (deep vein thrombosis)   . Iron deficiency anemia secondary to blood loss (chronic)   . Irritable bowel syndrome   . Lumbago   . Other B-complex deficiencies   . Personal history of colonic polyps   . Pure hypercholesterolemia   . Reflux esophagitis   . Type II or unspecified type diabetes mellitus without mention of complication, not stated as uncontrolled   . Unspecified essential hypertension   . Unspecified hypothyroidism   . Unspecified iridocyclitis   . Unspecified venous (peripheral) insufficiency   . Unspecified vitamin D deficiency     Past Surgical History:  Procedure Laterality Date  . CARPAL TUNNEL RELEASE    . FEMORAL HERNIA REPAIR    . OOPHORECTOMY    . VESICOVAGINAL FISTULA CLOSURE W/ TAH      There were no vitals filed for this visit.      [facility] PT Assessment - 06/26/17 0001      Standardized Balance Assessment   Standardized Balance Assessment  Berg Balance Test      Berg Balance Test   Sit to Stand  Able to stand without using hands and stabilize independently    Standing Unsupported  Able to stand safely 2 minutes    Sitting  with Back Unsupported but Feet Supported on Floor or Stool  Able to sit safely and securely 2 minutes    Stand to Sit  Sits safely with minimal use of hands    Transfers  Able to transfer safely, minor use of hands    Standing Unsupported with Eyes Closed  Able to stand 10 seconds safely    Standing Ubsupported with Feet Together  Able to place feet together independently and stand 1 minute safely    From Standing, Reach Forward with Outstretched Arm  Can reach confidently >25 cm (10")    From Standing Position, Pick up Object from Floor  Able to pick up shoe safely and easily    From Standing Position, Turn to Look Behind Over each Shoulder  Looks behind from both sides and weight shifts well    Turn 360 Degrees  Able to turn 360 degrees safely in 4 seconds or less    Standing Unsupported, Alternately Place Feet on Step/Stool  Able to stand independently and safely and complete 8 steps in 20 seconds    Standing Unsupported, One Foot in Front  Able to place foot tandem independently and hold 30 seconds    Standing on One Leg  Able to lift leg  independently and hold > 10 seconds    Total Score  56                  [facility] Adult PT Treatment/Exercise - 06/26/17 0001      Lumbar Exercises: Stretches   Single Knee to Chest Stretch  3 reps;30 seconds      Lumbar Exercises: Supine   Ab Set  10 reps;5 seconds      Knee/Hip Exercises: Stretches   Active Hamstring Stretch  2 reps;30 seconds strap    Hip Flexor Stretch  2 reps;30 seconds;Both    Piriformis Stretch  3 reps;30 seconds             PT Education - 06/26/17 0931    Education provided  Yes    Education Details  HEP    Person(s) Educated  Patient    Methods  Explanation;Handout    Comprehension  Verbalized understanding          PT Long Term Goals - 06/21/17 1248      PT LONG TERM GOAL #1   Title  She will be independent with all HEp issued    Time  4    Period  Weeks    Status  New      PT LONG TERM  GOAL #2   Title  she will report Rt thigh symtoms decreasd 50% or more     Time  4    Period  Weeks    Status  New      PT LONG TERM GOAL #3   Title  FOTO score will improve to  30% limited or better    to demo improved function.     Time  4    Period  Weeks    Status  New      PT LONG TERM GOAL #4   Title  BERG score will improve by 3-5 points to demo improved balance    Time  4    Period  Weeks    Status  New            Plan - 06/26/17 0903    Clinical Impression Statement  BERG 56/56. Reviewed Hip stretching and progressed. Began core. Updated HEP. no increased pain.     PT Next Visit Plan  REveiw stretch HEP quad stretching , manual, modalities  Add to HEP    PT Home Exercise Plan  knee to RT and LT shoulder stretch, hamstring stretch, hip flexor stretch, abdominal draw in    Consulted and Agree with Plan of Care  Patient       Patient will benefit from skilled therapeutic intervention in order to improve the following deficits and impairments:  Pain, Postural dysfunction, Decreased balance, Decreased range of motion  Visit Diagnosis: Pain in right thigh  Stiffness of right hip, not elsewhere classified  Unsteadiness on feet     Problem List Patient Active Problem List   Diagnosis Date Noted  . Benign skin lesion of face 11/02/2016  . Chronic idiopathic constipation 11/02/2016  . Obesity, Class I, BMI 30.0-34.9 (see actual BMI) 09/20/2016  . Spinal stenosis, lumbar region with neurogenic claudication 06/28/2016  . Meralgia paresthetica, right 05/30/2016  . Hyperglycemia 11/27/2012  . Vitamin D deficiency 03/05/2008  . Hyperlipidemia with target LDL less than 100 11/16/2007  . Hypothyroidism 04/03/2007  . B12 deficiency 04/03/2007  . GAD (generalized anxiety disorder) 04/03/2007  . PSEUDOTUMOR CEREBRI 04/03/2007  . CARPAL TUNNEL SYNDROME, BILATERAL 04/03/2007  .  IRITIS 04/03/2007  . Essential hypertension 04/03/2007  . History of DVT of lower  extremity 04/03/2007  . Venous (peripheral) insufficiency 04/03/2007  . REFLUX ESOPHAGITIS 04/03/2007  . IRRITABLE BOWEL SYNDROME 04/03/2007  . DEGENERATIVE DISC DISEASE, CERVICAL SPINE 04/03/2007    Dorene Ar, PTA 06/26/2017, 10:10 AM  [place], [place], [address] Phone: [phone]   Fax:  [phone]  Name: Katie [name] MRN: [id number] Date of Birth: 1945/10/03

## 2017-06-27 ENCOUNTER — Other Ambulatory Visit: Payer: Self-pay | Admitting: Internal Medicine

## 2017-06-27 DIAGNOSIS — F411 Generalized anxiety disorder: Secondary | ICD-10-CM

## 2017-07-02 ENCOUNTER — Other Ambulatory Visit: Payer: Self-pay | Admitting: Internal Medicine

## 2017-07-02 DIAGNOSIS — I10 Essential (primary) hypertension: Secondary | ICD-10-CM

## 2017-07-02 MED ORDER — LISINOPRIL 10 MG PO TABS: 10.0000 mg | ORAL_TABLET | Freq: Every day | ORAL | 1 refills | Status: DC

## 2017-07-03 ENCOUNTER — Ambulatory Visit: Payer: Medicare Other | Admitting: Physical Therapy

## 2017-07-03 ENCOUNTER — Encounter: Payer: Self-pay | Admitting: Physical Therapy

## 2017-07-03 DIAGNOSIS — M25651 Stiffness of right hip, not elsewhere classified: Secondary | ICD-10-CM

## 2017-07-03 DIAGNOSIS — M79651 Pain in right thigh: Secondary | ICD-10-CM

## 2017-07-03 DIAGNOSIS — R2681 Unsteadiness on feet: Secondary | ICD-10-CM

## 2017-07-03 NOTE — Therapy (Signed)
[place], [place], [address] Phone: [phone]   Fax:  [phone]  Physical Therapy Treatment  Patient Details  Name: Katie [name] MRN: [id number] Date of Birth: 1946/03/19 Referring Provider: Scarlette Calico.  MD   Encounter Date: 07/03/2017  PT End of Session - 07/03/17 0851    Visit Number  3    Number of Visits  9    Date for PT Re-Evaluation  07/20/17    Authorization Type  UHC MCR    PT Start Time  7893    PT Stop Time  0930    PT Time Calculation (min)  43 min       Past Medical History:  Diagnosis Date  . Acute venous embolism and thrombosis of unspecified deep vessels of lower extremity 2001   RLE DVT and PE, coumadin x 63mo, now ASSA  . Anxiety state, unspecified   . Degeneration of cervical intervertebral disc   . History of DVT (deep vein thrombosis)   . Iron deficiency anemia secondary to blood loss (chronic)   . Irritable bowel syndrome   . Lumbago   . Other B-complex deficiencies   . Personal history of colonic polyps   . Pure hypercholesterolemia   . Reflux esophagitis   . Type II or unspecified type diabetes mellitus without mention of complication, not stated as uncontrolled   . Unspecified essential hypertension   . Unspecified hypothyroidism   . Unspecified iridocyclitis   . Unspecified venous (peripheral) insufficiency   . Unspecified vitamin D deficiency     Past Surgical History:  Procedure Laterality Date  . CARPAL TUNNEL RELEASE    . FEMORAL HERNIA REPAIR    . OOPHORECTOMY    . VESICOVAGINAL FISTULA CLOSURE W/ TAH      There were no vitals filed for this visit.  Subjective Assessment - 07/03/17 0851    Subjective  Sensation is more mild now.     Currently in Pain?  No/denies    Aggravating Factors   sitting prolonged    Pain Relieving Factors  position change                       [facility] Adult PT Treatment/Exercise - 07/03/17 0001      Lumbar  Exercises: Stretches   Single Knee to Chest Stretch  3 reps;30 seconds      Lumbar Exercises: Supine   Ab Set  10 reps;5 seconds    Pelvic Tilt  10 reps;5 seconds    Clam  10 reps;20 reps unilateral and bilateral with ab draw in    Bent Knee Raise  20 reps with abdominal draw in      Knee/Hip Exercises: Stretches   Active Hamstring Stretch  2 reps;30 seconds strap    Hip Flexor Stretch  2 reps;30 seconds;Both    Piriformis Stretch  3 reps;30 seconds             PT Education - 07/03/17 0925    Education provided  Yes    Education Details  HEP    Person(s) Educated  Patient    Methods  Explanation;Handout    Comprehension  Verbalized understanding          PT Long Term Goals - 06/21/17 1248      PT LONG TERM GOAL #1   Title  She will be independent with all HEp issued    Time  4    Period  Weeks  Status  New      PT LONG TERM GOAL #2   Title  she will report Rt thigh symtoms decreasd 50% or more     Time  4    Period  Weeks    Status  New      PT LONG TERM GOAL #3   Title  FOTO score will improve to  30% limited or better    to demo improved function.     Time  4    Period  Weeks    Status  New      PT LONG TERM GOAL #4   Title  BERG score will improve by 3-5 points to demo improved balance    Time  4    Period  Weeks    Status  New            Plan - 07/03/17 0909    Clinical Impression Statement  Pt arrives reporting a decreased intensity of sensation in right thigh. Frequency is about the same. She is consistent with HEP and attends gym and walks for 30 mintutes on T.M. and rotates between upper body and lower body machines.     PT Next Visit Plan  REveiw stretch HEP quad stretching , manual, modalities  progess core HEP     PT Home Exercise Plan  knee to RT and LT shoulder stretch, hamstring stretch, hip flexor stretch, abdominal draw in    Consulted and Agree with Plan of Care  Patient       Patient will benefit from skilled therapeutic  intervention in order to improve the following deficits and impairments:  Pain, Postural dysfunction, Decreased balance, Decreased range of motion  Visit Diagnosis: Pain in right thigh  Stiffness of right hip, not elsewhere classified  Unsteadiness on feet     Problem List Patient Active Problem List   Diagnosis Date Noted  . Benign skin lesion of face 11/02/2016  . Chronic idiopathic constipation 11/02/2016  . Obesity, Class I, BMI 30.0-34.9 (see actual BMI) 09/20/2016  . Spinal stenosis, lumbar region with neurogenic claudication 06/28/2016  . Meralgia paresthetica, right 05/30/2016  . Hyperglycemia 11/27/2012  . Vitamin D deficiency 03/05/2008  . Hyperlipidemia with target LDL less than 100 11/16/2007  . Hypothyroidism 04/03/2007  . B12 deficiency 04/03/2007  . GAD (generalized anxiety disorder) 04/03/2007  . PSEUDOTUMOR CEREBRI 04/03/2007  . CARPAL TUNNEL SYNDROME, BILATERAL 04/03/2007  . IRITIS 04/03/2007  . Essential hypertension 04/03/2007  . History of DVT of lower extremity 04/03/2007  . Venous (peripheral) insufficiency 04/03/2007  . REFLUX ESOPHAGITIS 04/03/2007  . IRRITABLE BOWEL SYNDROME 04/03/2007  . [place] DISEASE, CERVICAL SPINE 04/03/2007    Dorene Ar, PTA 07/03/2017, 9:44 AM  [place], [place], [address] Phone: [phone]   Fax:  [phone]  Name: Katie [name] MRN: [id number] Date of Birth: 27-Nov-1945

## 2017-07-05 ENCOUNTER — Ambulatory Visit: Payer: Medicare Other

## 2017-07-05 DIAGNOSIS — M79651 Pain in right thigh: Secondary | ICD-10-CM | POA: Diagnosis not present

## 2017-07-05 DIAGNOSIS — M25651 Stiffness of right hip, not elsewhere classified: Secondary | ICD-10-CM

## 2017-07-05 DIAGNOSIS — R2681 Unsteadiness on feet: Secondary | ICD-10-CM

## 2017-07-05 NOTE — Patient Instructions (Signed)
Issued single leg standing and tandem standing for balance 2-3x/day 5 attempts   As long as able

## 2017-07-05 NOTE — Therapy (Signed)
[place], [place], [address] Phone: [phone]   Fax:  [phone]  Physical Therapy Treatment  Patient Details  Name: Katie [name] MRN: [id number] Date of Birth: 1945/07/10 Referring Provider: Scarlette Calico.  MD   Encounter Date: 07/05/2017  PT End of Session - 07/05/17 0935    Visit Number  4    Number of Visits  9    Date for PT Re-Evaluation  07/20/17    Authorization Type  UHC MCR    PT Start Time  9983    PT Stop Time  1015    PT Time Calculation (min)  40 min    Activity Tolerance  Patient tolerated treatment well    Behavior During Therapy  WFL for tasks assessed/performed       Past Medical History:  Diagnosis Date  . Acute venous embolism and thrombosis of unspecified deep vessels of lower extremity 2001   RLE DVT and PE, coumadin x 20mo, now ASSA  . Anxiety state, unspecified   . Degeneration of cervical intervertebral disc   . History of DVT (deep vein thrombosis)   . Iron deficiency anemia secondary to blood loss (chronic)   . Irritable bowel syndrome   . Lumbago   . Other B-complex deficiencies   . Personal history of colonic polyps   . Pure hypercholesterolemia   . Reflux esophagitis   . Type II or unspecified type diabetes mellitus without mention of complication, not stated as uncontrolled   . Unspecified essential hypertension   . Unspecified hypothyroidism   . Unspecified iridocyclitis   . Unspecified venous (peripheral) insufficiency   . Unspecified vitamin D deficiency     Past Surgical History:  Procedure Laterality Date  . CARPAL TUNNEL RELEASE    . FEMORAL HERNIA REPAIR    . OOPHORECTOMY    . VESICOVAGINAL FISTULA CLOSURE W/ TAH      There were no vitals filed for this visit.  Subjective Assessment - 07/05/17 0942    Subjective  80% improved. with tingle    Currently in Pain?  No/denies                      [facility] Adult PT Treatment/Exercise -  07/05/17 0001      Neuro Re-ed    Neuro Re-ed Details   single leg stand Lt 15 sec RT 10 sec or less  and tandem RT /Lt forward with weight shifting  botrh issued for home      Lumbar Exercises: Stretches   Single Knee to Chest Stretch  2 reps;30 seconds      Lumbar Exercises: Supine   Pelvic Tilt  15 reps;5 seconds    Clam  Limitations    Clam Limitations  10 x RT and LT and 10 x bilaterally with pelvic tilt      Knee/Hip Exercises: Aerobic   Recumbent Bike  L2 5 min      Knee/Hip Exercises: Standing   Forward Step Up  Right;Hand Hold: 2;15 reps;Step Height: 8"    Step Down  Right;Hand Hold: 2;15 reps;Step Height: 4"      PF x 20 standing       PT Education - 07/05/17 1015    Education provided  Yes    Education Details  single leg and tandem balance    Person(s) Educated  Patient    Methods  Explanation;Demonstration;Tactile cues;Verbal cues;Handout    Comprehension  Returned demonstration;Verbalized understanding  PT Long Term Goals - 07/05/17 1016      PT LONG TERM GOAL #1   Title  She will be independent with all HEp issued    Status  On-going      PT LONG TERM GOAL #2   Title  she will report Rt thigh symtoms decreasd 50% or more     Status  Achieved      PT LONG TERM GOAL #3   Title  FOTO score will improve to  30% limited or better    to demo improved function.       PT LONG TERM GOAL #4   Title  BERG score will improve by 3-5 points to demo improved balance    Status  Unable to assess            Plan - 07/05/17 1015    Clinical Impression Statement  Improved symptoms in thigh.   Added balance activity as pt report RT leg less stable.   continue to progress HEP     PT Treatment/Interventions  Electrical Stimulation;Iontophoresis 4mg /ml Dexamethasone;Ultrasound;Moist Heat;Therapeutic exercise;Manual techniques;Passive range of motion;Patient/family education    PT Next Visit Plan  REveiw stretch HEP quad stretching , manual, modalities   progess core HEP     PT Home Exercise Plan  knee to RT and LT shoulder stretch, hamstring stretch, hip flexor stretch, abdominal draw in, standing balsnce single leg and tandem    Consulted and Agree with Plan of Care  Patient       Patient will benefit from skilled therapeutic intervention in order to improve the following deficits and impairments:  Pain, Postural dysfunction, Decreased balance, Decreased range of motion  Visit Diagnosis: Pain in right thigh  Stiffness of right hip, not elsewhere classified  Unsteadiness on feet     Problem List Patient Active Problem List   Diagnosis Date Noted  . Benign skin lesion of face 11/02/2016  . Chronic idiopathic constipation 11/02/2016  . Obesity, Class I, BMI 30.0-34.9 (see actual BMI) 09/20/2016  . Spinal stenosis, lumbar region with neurogenic claudication 06/28/2016  . Meralgia paresthetica, right 05/30/2016  . Hyperglycemia 11/27/2012  . Vitamin D deficiency 03/05/2008  . Hyperlipidemia with target LDL less than 100 11/16/2007  . Hypothyroidism 04/03/2007  . B12 deficiency 04/03/2007  . GAD (generalized anxiety disorder) 04/03/2007  . PSEUDOTUMOR CEREBRI 04/03/2007  . CARPAL TUNNEL SYNDROME, BILATERAL 04/03/2007  . IRITIS 04/03/2007  . Essential hypertension 04/03/2007  . History of DVT of lower extremity 04/03/2007  . Venous (peripheral) insufficiency 04/03/2007  . REFLUX ESOPHAGITIS 04/03/2007  . IRRITABLE BOWEL SYNDROME 04/03/2007  . [place] DISEASE, CERVICAL SPINE 04/03/2007    Darrel Hoover PT 07/05/2017, 10:17 AM  [facility] [address] [place], [place], [address] Phone: [phone]   Fax:  [phone]  Name: Katie [name] MRN: [id number] Date of Birth: Oct 09, 1945

## 2017-07-09 ENCOUNTER — Telehealth: Payer: Self-pay | Admitting: Internal Medicine

## 2017-07-09 ENCOUNTER — Ambulatory Visit: Payer: Medicare Other

## 2017-07-09 DIAGNOSIS — R2681 Unsteadiness on feet: Secondary | ICD-10-CM

## 2017-07-09 DIAGNOSIS — M25651 Stiffness of right hip, not elsewhere classified: Secondary | ICD-10-CM

## 2017-07-09 DIAGNOSIS — M79651 Pain in right thigh: Secondary | ICD-10-CM | POA: Diagnosis not present

## 2017-07-09 DIAGNOSIS — E538 Deficiency of other specified B group vitamins: Secondary | ICD-10-CM

## 2017-07-09 MED ORDER — CYANOCOBALAMIN 1000 MCG/ML IJ SOLN
INTRAMUSCULAR | 1 refills | Status: DC
Start: 2017-07-09 — End: 2018-10-18

## 2017-07-09 NOTE — Telephone Encounter (Signed)
Reviewed chart pt is up-to-date sent refills to [facility].../lmb  

## 2017-07-09 NOTE — Therapy (Signed)
[place], [place], [address] Phone: [phone]   Fax:  [phone]  Physical Therapy Treatment  Patient Details  Name: Katie [name] MRN: [id number] Date of Birth: 14-Jun-1945 Referring Provider: Scarlette Calico.  MD   Encounter Date: 07/09/2017  PT End of Session - 07/09/17 0934    Visit Number  5    Number of Visits  9    Date for PT Re-Evaluation  07/20/17    Authorization Type  UHC MCR    PT Start Time  9233    PT Stop Time  1015    PT Time Calculation (min)  40 min    Activity Tolerance  Patient tolerated treatment well    Behavior During Therapy  WFL for tasks assessed/performed       Past Medical History:  Diagnosis Date  . Acute venous embolism and thrombosis of unspecified deep vessels of lower extremity 2001   RLE DVT and PE, coumadin x 65mo now ASSA  . Anxiety state, unspecified   . Degeneration of cervical intervertebral disc   . History of DVT (deep vein thrombosis)   . Iron deficiency anemia secondary to blood loss (chronic)   . Irritable bowel syndrome   . Lumbago   . Other B-complex deficiencies   . Personal history of colonic polyps   . Pure hypercholesterolemia   . Reflux esophagitis   . Type II or unspecified type diabetes mellitus without mention of complication, not stated as uncontrolled   . Unspecified essential hypertension   . Unspecified hypothyroidism   . Unspecified iridocyclitis   . Unspecified venous (peripheral) insufficiency   . Unspecified vitamin D deficiency     Past Surgical History:  Procedure Laterality Date  . CARPAL TUNNEL RELEASE    . FEMORAL HERNIA REPAIR    . OOPHORECTOMY    . VESICOVAGINAL FISTULA CLOSURE W/ TAH      There were no vitals filed for this visit.  Subjective Assessment - 07/09/17 0936    Currently in Pain?  No/denies    Pain Location  -- lateral thigh    Pain Orientation  Right    Pain Descriptors / Indicators  Burning;Tingling     Pain Type  Chronic pain    Pain Onset  More than a month ago    Pain Frequency  Intermittent                      [facility] Adult PT Treatment/Exercise - 07/09/17 0001      Knee/Hip Exercises: Stretches   Other Knee/Hip Stretches  Prone knee flexion with PT assist 60 sec x 2      Knee/Hip Exercises: Aerobic   Recumbent Bike  L3  5 min      Knee/Hip Exercises: Standing   Lateral Step Up  Right;15 reps;Hand Hold: 1;Step Height: 8"    Forward Step Up  Right;Hand Hold: 2;15 reps;Step Height: 8"    Step Down  Right;Hand Hold: 2;15 reps;Step Height: 4"    SLS with Vectors  x 15  reps RT and LT                   PT Long Term Goals - 07/05/17 1016      PT LONG TERM GOAL #1   Title  She will be independent with all HEp issued    Status  On-going      PT LONG TERM GOAL #2   Title  she  will report Rt thigh symtoms decreasd 50% or more     Status  Achieved      PT LONG TERM GOAL #3   Title  FOTO score will improve to  30% limited or better    to demo improved function.       PT LONG TERM GOAL #4   Title  BERG score will improve by 3-5 points to demo improved balance    Status  Unable to assess            Plan - 07/09/17 0934    Clinical Impression Statement  Alot of tinme was spent on taling about reasons for balance issues and how the exercises can improve this. She has some [employer] instruction options an dshe should use this as able as this will improve her balance and LE strength/stability    PT Treatment/Interventions  Electrical Stimulation;Iontophoresis 68m/ml Dexamethasone;Ultrasound;Moist Heat;Therapeutic exercise;Manual techniques;Passive range of motion;Patient/family education    PT Next Visit Plan  REveiw stretch HEP quad stretching , manual, modalities  progess core HEP  Cont LE strength and balance . Maybe review gym equipment    PT Home Exercise Plan  knee to RT and LT shoulder stretch, hamstring stretch, hip flexor stretch, abdominal draw  in, standing balsnce single leg and tandem    Consulted and Agree with Plan of Care  Patient       Patient will benefit from skilled therapeutic intervention in order to improve the following deficits and impairments:  Pain, Postural dysfunction, Decreased balance, Decreased range of motion  Visit Diagnosis: Pain in right thigh  Stiffness of right hip, not elsewhere classified  Unsteadiness on feet     Problem List Patient Active Problem List   Diagnosis Date Noted  . Benign skin lesion of face 11/02/2016  . Chronic idiopathic constipation 11/02/2016  . Obesity, Class I, BMI 30.0-34.9 (see actual BMI) 09/20/2016  . Spinal stenosis, lumbar region with neurogenic claudication 06/28/2016  . Meralgia paresthetica, right 05/30/2016  . Hyperglycemia 11/27/2012  . Vitamin D deficiency 03/05/2008  . Hyperlipidemia with target LDL less than 100 11/16/2007  . Hypothyroidism 04/03/2007  . B12 deficiency 04/03/2007  . GAD (generalized anxiety disorder) 04/03/2007  . PSEUDOTUMOR CEREBRI 04/03/2007  . CARPAL TUNNEL SYNDROME, BILATERAL 04/03/2007  . IRITIS 04/03/2007  . Essential hypertension 04/03/2007  . History of DVT of lower extremity 04/03/2007  . Venous (peripheral) insufficiency 04/03/2007  . REFLUX ESOPHAGITIS 04/03/2007  . IRRITABLE BOWEL SYNDROME 04/03/2007  . [place]DISEASE, CERVICAL SPINE 04/03/2007    CDarrel Hoover PT 07/09/2017, 10:17 AM  [facility] [address]Phone: [phone]  Fax:  [phone] Name: Katie RohlmanMRN: [id number] of Birth: 829-Dec-1947

## 2017-07-09 NOTE — Telephone Encounter (Signed)
Pt needs a refill of her  cyanocobalamin (,VITAMIN B-12,) 1000 MCG/ML injection  Please send to [place]

## 2017-07-12 ENCOUNTER — Encounter: Payer: Self-pay | Admitting: Physical Therapy

## 2017-07-12 ENCOUNTER — Ambulatory Visit: Payer: Medicare Other | Admitting: Physical Therapy

## 2017-07-12 DIAGNOSIS — M25651 Stiffness of right hip, not elsewhere classified: Secondary | ICD-10-CM

## 2017-07-12 DIAGNOSIS — R2681 Unsteadiness on feet: Secondary | ICD-10-CM

## 2017-07-12 DIAGNOSIS — M79651 Pain in right thigh: Secondary | ICD-10-CM

## 2017-07-12 NOTE — Therapy (Signed)
[place], [place], [address] Phone: [phone]   Fax:  [phone]  Physical Therapy Treatment  Patient Details  Name: Katie [name] MRN: [id number] Date of Birth: 07-04-1945 Referring Provider: Scarlette Calico.  MD   Encounter Date: 07/12/2017  PT End of Session - 07/12/17 1308    Visit Number  6    Number of Visits  9    Date for PT Re-Evaluation  07/20/17    Authorization Type  UHC MCR    PT Start Time  1300    PT Stop Time  1345    PT Time Calculation (min)  45 min    Activity Tolerance  Patient limited by lethargy    Behavior During Therapy  WFL for tasks assessed/performed       Past Medical History:  Diagnosis Date  . Acute venous embolism and thrombosis of unspecified deep vessels of lower extremity 2001   RLE DVT and PE, coumadin x 16mo, now ASSA  . Anxiety state, unspecified   . Degeneration of cervical intervertebral disc   . History of DVT (deep vein thrombosis)   . Iron deficiency anemia secondary to blood loss (chronic)   . Irritable bowel syndrome   . Lumbago   . Other B-complex deficiencies   . Personal history of colonic polyps   . Pure hypercholesterolemia   . Reflux esophagitis   . Type II or unspecified type diabetes mellitus without mention of complication, not stated as uncontrolled   . Unspecified essential hypertension   . Unspecified hypothyroidism   . Unspecified iridocyclitis   . Unspecified venous (peripheral) insufficiency   . Unspecified vitamin D deficiency     Past Surgical History:  Procedure Laterality Date  . CARPAL TUNNEL RELEASE    . FEMORAL HERNIA REPAIR    . OOPHORECTOMY    . VESICOVAGINAL FISTULA CLOSURE W/ TAH      There were no vitals filed for this visit.                   [facility] Adult PT Treatment/Exercise - 07/12/17 0001      Neuro Re-ed    Neuro Re-ed Details   SLS R: 30 seconds with initial UE support to reach position, L  SLS: 30 seconds with UE support to reach position, pt standing on level surface, Standing on foam mat: R SLS: 6 seconds, L SLS: 15 seconds with UE support to reach initial position      Exercises   Exercises  Lumbar      Lumbar Exercises: Stretches   Prone on Elbows Stretch  3 reps;30 seconds      Lumbar Exercises: Seated   Sit to Stand  10 reps;Limitations    Sit to Stand Limitations  no UE support from mat table      Lumbar Exercises: Prone   Straight Leg Raise  15 reps;2 seconds      Knee/Hip Exercises: Stretches   Other Knee/Hip Stretches  Prone knee flexion with PT assist 60 sec x 2      Knee/Hip Exercises: Aerobic   Recumbent Bike  L3  5 min      Knee/Hip Exercises: Standing   Lateral Step Up  Right;15 reps;Hand Hold: 1;Step Height: 8"    Forward Step Up  Right;Hand Hold: 2;15 reps;Step Height: 8"    Step Down  Right;Hand Hold: 2;15 reps;Step Height: 4"    Walking with Sports Cord  Side stepping with green theraband around  pt's ankles 8 feet x 6 times down and back with hand held assistance             PT Education - 07/12/17 1307    Education Details  Exercise program    Person(s) Educated  Patient    Methods  Explanation    Comprehension  Verbalized understanding          PT Long Term Goals - 07/12/17 1350      PT LONG TERM GOAL #1   Title  She will be independent with all HEp issued    Time  4    Period  Weeks    Status  On-going      PT LONG TERM GOAL #2   Title  she will report Rt thigh symtoms decreasd 50% or more     Time  4    Period  Weeks    Status  Achieved      PT LONG TERM GOAL #3   Title  FOTO score will improve to  30% limited or better  to demo improved function.     Time  4    Period  Weeks    Status  Unable to assess      PT LONG TERM GOAL #4   Title  BERG score will improve by 3-5 points to demo improved balance    Time  4    Period  Weeks    Status  Unable to assess            Plan - 07/12/17 1309    Clinical  Impression Statement  Pt tolerating all exercises well including balance with intermittent hand held support. Continuing with LE strengthening and dynamic balance.     Rehab Potential  Good    PT Frequency  2x / week    PT Duration  4 weeks    PT Treatment/Interventions  Electrical Stimulation;Iontophoresis 4mg /ml Dexamethasone;Ultrasound;Moist Heat;Therapeutic exercise;Manual techniques;Passive range of motion;Patient/family education    PT Next Visit Plan  REveiw stretch HEP quad stretching , manual, modalities  progess core HEP  Cont LE strength and balance . Maybe review gym equipment    PT Home Exercise Plan  knee to RT and LT shoulder stretch, hamstring stretch, hip flexor stretch, abdominal draw in, standing balsnce single leg and tandem    Consulted and Agree with Plan of Care  Patient       Patient will benefit from skilled therapeutic intervention in order to improve the following deficits and impairments:  Pain, Postural dysfunction, Decreased balance, Decreased range of motion  Visit Diagnosis: Pain in right thigh  Stiffness of right hip, not elsewhere classified  Unsteadiness on feet     Problem List Patient Active Problem List   Diagnosis Date Noted  . Benign skin lesion of face 11/02/2016  . Chronic idiopathic constipation 11/02/2016  . Obesity, Class I, BMI 30.0-34.9 (see actual BMI) 09/20/2016  . Spinal stenosis, lumbar region with neurogenic claudication 06/28/2016  . Meralgia paresthetica, right 05/30/2016  . Hyperglycemia 11/27/2012  . Vitamin D deficiency 03/05/2008  . Hyperlipidemia with target LDL less than 100 11/16/2007  . Hypothyroidism 04/03/2007  . B12 deficiency 04/03/2007  . GAD (generalized anxiety disorder) 04/03/2007  . PSEUDOTUMOR CEREBRI 04/03/2007  . CARPAL TUNNEL SYNDROME, BILATERAL 04/03/2007  . IRITIS 04/03/2007  . Essential hypertension 04/03/2007  . History of DVT of lower extremity 04/03/2007  . Venous (peripheral) insufficiency  04/03/2007  . REFLUX ESOPHAGITIS 04/03/2007  . IRRITABLE BOWEL SYNDROME 04/03/2007  .  [place] DISEASE, CERVICAL SPINE 04/03/2007    Oretha Caprice, MPT 07/12/2017, 1:52 PM  [facility] [address] [place], [place], [address] Phone: [phone]   Fax:  [phone]  Name: Katie [name] MRN: [id number] Date of Birth: April 26, 1946

## 2017-07-17 ENCOUNTER — Encounter: Payer: Self-pay | Admitting: Physical Therapy

## 2017-07-17 ENCOUNTER — Ambulatory Visit: Payer: Medicare Other | Attending: Internal Medicine | Admitting: Physical Therapy

## 2017-07-17 DIAGNOSIS — R2681 Unsteadiness on feet: Secondary | ICD-10-CM | POA: Diagnosis present

## 2017-07-17 DIAGNOSIS — M25651 Stiffness of right hip, not elsewhere classified: Secondary | ICD-10-CM | POA: Insufficient documentation

## 2017-07-17 DIAGNOSIS — M79651 Pain in right thigh: Secondary | ICD-10-CM | POA: Diagnosis not present

## 2017-07-17 NOTE — Therapy (Signed)
[place], [place], [address] Phone: [phone]   Fax:  [phone]  Physical Therapy Treatment  Patient Details  Name: Katie [name] MRN: [id number] Date of Birth: [birth year] Referring Provider: Scarlette Calico.  MD   Encounter Date: 07/17/2017  PT End of Session - 07/17/17 1206    Visit Number  7    Number of Visits  9    Date for PT Re-Evaluation  07/20/17    Authorization Type  [employer] MCR    PT Start Time  5093    PT Stop Time  1230    PT Time Calculation (min)  45 min       Past Medical History:  Diagnosis Date  . Acute venous embolism and thrombosis of unspecified deep vessels of lower extremity 2001   RLE DVT and PE, coumadin x 67mo, now ASSA  . Anxiety state, unspecified   . Degeneration of cervical intervertebral disc   . History of DVT (deep vein thrombosis)   . Iron deficiency anemia secondary to blood loss (chronic)   . Irritable bowel syndrome   . Lumbago   . Other B-complex deficiencies   . Personal history of colonic polyps   . Pure hypercholesterolemia   . Reflux esophagitis   . Type II or unspecified type diabetes mellitus without mention of complication, not stated as uncontrolled   . Unspecified essential hypertension   . Unspecified hypothyroidism   . Unspecified iridocyclitis   . Unspecified venous (peripheral) insufficiency   . Unspecified vitamin D deficiency     Past Surgical History:  Procedure Laterality Date  . CARPAL TUNNEL RELEASE    . FEMORAL HERNIA REPAIR    . OOPHORECTOMY    . VESICOVAGINAL FISTULA CLOSURE W/ TAH      There were no vitals filed for this visit.  Subjective Assessment - 07/17/17 1153    Subjective  Still have the tingling sometimes. I had it some yesterday and the day before.     Currently in Pain?  No/denies    Aggravating Factors   laying down, prolonged position.     Pain Relieving Factors  Rub it, biofreeze, alcohol rub          [facility] PT  Assessment - 07/17/17 0001      Observation/Other Assessments   Focus on Therapeutic Outcomes (FOTO)   17% limited improved from 38% limited.                   [facility] Adult PT Treatment/Exercise - 07/17/17 0001      Lumbar Exercises: Stretches   Single Knee to Chest Stretch  2 reps;30 seconds      Lumbar Exercises: Seated   Sit to Stand  10 reps;Limitations    Sit to Stand Limitations  no UE support from mat table      Lumbar Exercises: Supine   Clam  Limitations    Clam Limitations  10 x RT and LT and 10 x bilaterally with pelvic tilt    Bent Knee Raise  20 reps with abdominal draw in      Knee/Hip Exercises: Stretches   Hip Flexor Stretch  2 reps;30 seconds;Both    Piriformis Stretch  3 reps;30 seconds      Knee/Hip Exercises: Aerobic   Recumbent Bike  L3  5 min      Knee/Hip Exercises: Standing   SLS  26 sec     SLS with Vectors  x 15  reps  RT and LT     Walking with Sports Cord  Side stepping with green theraband around pt's ankles 8 feet x 6 times down and back with hand held assistance                  PT Long Term Goals - 07/12/17 1350      PT LONG TERM GOAL #1   Title  She will be independent with all HEp issued    Time  4    Period  Weeks    Status  On-going      PT LONG TERM GOAL #2   Title  she will report Rt thigh symtoms decreasd 50% or more     Time  4    Period  Weeks    Status  Achieved      PT LONG TERM GOAL #3   Title  FOTO score will improve to  30% limited or better  to demo improved function.     Time  4    Period  Weeks    Status  Unable to assess      PT LONG TERM GOAL #4   Title  BERG score will improve by 3-5 points to demo improved balance    Time  4    Period  Weeks    Status  Unable to assess            Plan - 07/17/17 1223    Clinical Impression Statement  pt doing well with reduced intensity and frequency of N/T in thigh. She is consistent with HEP. She is still concerned about her diagnosis and  plans to discuss with MD. FOTO score improved. Will recheck BERG and discharge next visit.     PT Next Visit Plan  BERG, discharge    PT Home Exercise Plan  knee to RT and LT shoulder stretch, hamstring stretch, hip flexor stretch, abdominal draw in, standing balsnce single leg and tandem    Consulted and Agree with Plan of Care  Patient       Patient will benefit from skilled therapeutic intervention in order to improve the following deficits and impairments:     Visit Diagnosis: Pain in right thigh  Stiffness of right hip, not elsewhere classified  Unsteadiness on feet     Problem List Patient Active Problem List   Diagnosis Date Noted  . Benign skin lesion of face 11/02/2016  . Chronic idiopathic constipation 11/02/2016  . Obesity, Class I, BMI 30.0-34.9 (see actual BMI) 09/20/2016  . Spinal stenosis, lumbar region with neurogenic claudication 06/28/2016  . Meralgia paresthetica, right 05/30/2016  . Hyperglycemia 11/27/2012  . Vitamin D deficiency 03/05/2008  . Hyperlipidemia with target LDL less than 100 11/16/2007  . Hypothyroidism 04/03/2007  . B12 deficiency 04/03/2007  . GAD (generalized anxiety disorder) 04/03/2007  . PSEUDOTUMOR CEREBRI 04/03/2007  . CARPAL TUNNEL SYNDROME, BILATERAL 04/03/2007  . IRITIS 04/03/2007  . Essential hypertension 04/03/2007  . History of DVT of lower extremity 04/03/2007  . Venous (peripheral) insufficiency 04/03/2007  . REFLUX ESOPHAGITIS 04/03/2007  . IRRITABLE BOWEL SYNDROME 04/03/2007  . [place] DISEASE, CERVICAL SPINE 04/03/2007    Dorene Ar, PTA 07/17/2017, 4:54 PM  [place], [place], [address] Phone: [phone]   Fax:  [phone]  Name: Katie [name] MRN: [id number] Date of Birth: 11-03-1945

## 2017-07-19 ENCOUNTER — Encounter: Payer: Self-pay | Admitting: Physical Therapy

## 2017-07-19 ENCOUNTER — Ambulatory Visit: Payer: Medicare Other | Admitting: Physical Therapy

## 2017-07-19 DIAGNOSIS — M25651 Stiffness of right hip, not elsewhere classified: Secondary | ICD-10-CM

## 2017-07-19 DIAGNOSIS — M79651 Pain in right thigh: Secondary | ICD-10-CM | POA: Diagnosis not present

## 2017-07-19 DIAGNOSIS — R2681 Unsteadiness on feet: Secondary | ICD-10-CM

## 2017-07-19 NOTE — Therapy (Addendum)
[facility] [address] [place], [place], [address] Phone: [phone]   Fax:  [phone]  Physical Therapy Treatment/Discharge  Patient Details  Name: Katie [name] MRN: [id number] Date of Birth: 08/05/1945 Referring Provider: Thomas Jones.  MD   Encounter Date: 07/19/2017  PT End of Session - 07/19/17 1019    Visit Number  8    Number of Visits  9    Date for PT Re-Evaluation  07/20/17    Authorization Type  UHC MCR    PT Start Time  1015    PT Stop Time  1100    PT Time Calculation (min)  45 min       Past Medical History:  Diagnosis Date  . Acute venous embolism and thrombosis of unspecified deep vessels of lower extremity 2001   RLE DVT and PE, coumadin x 12mo, now ASSA  . Anxiety state, unspecified   . Degeneration of cervical intervertebral disc   . History of DVT (deep vein thrombosis)   . Iron deficiency anemia secondary to blood loss (chronic)   . Irritable bowel syndrome   . Lumbago   . Other B-complex deficiencies   . Personal history of colonic polyps   . Pure hypercholesterolemia   . Reflux esophagitis   . Type II or unspecified type diabetes mellitus without mention of complication, not stated as uncontrolled   . Unspecified essential hypertension   . Unspecified hypothyroidism   . Unspecified iridocyclitis   . Unspecified venous (peripheral) insufficiency   . Unspecified vitamin D deficiency     Past Surgical History:  Procedure Laterality Date  . CARPAL TUNNEL RELEASE    . FEMORAL HERNIA REPAIR    . OOPHORECTOMY    . VESICOVAGINAL FISTULA CLOSURE W/ TAH      There were no vitals filed for this visit.  Subjective Assessment - 07/19/17 1018    Subjective  Had a little tingling yesterday but no where near as intense as usual.     Currently in Pain?  No/denies         [facility] PT Assessment - 07/19/17 0001      Observation/Other Assessments   Focus on Therapeutic Outcomes (FOTO)   17%  limited,  improved from 38% limited.       Strength   Overall Strength Comments  WNL      Berg Balance Test   Berg comment:  56/56                  [facility] Adult PT Treatment/Exercise - 07/19/17 0001      Lumbar Exercises: Stretches   Single Knee to Chest Stretch  2 reps;30 seconds      Lumbar Exercises: Seated   Sit to Stand  10 reps;Limitations 2 sets     Sit to Stand Limitations  no UE support from mat table      Lumbar Exercises: Supine   Clam  10 reps;20 reps unilateral and bilateral with ab draw in    Bent Knee Raise  20 reps with abdominal draw in    Straight Leg Raise  10 reps      Knee/Hip Exercises: Stretches   Active Hamstring Stretch  2 reps;30 seconds strap    Hip Flexor Stretch  2 reps;30 seconds;Both    Piriformis Stretch  3 reps;30 seconds      Knee/Hip Exercises: Aerobic   Recumbent Bike  L3  5 min      Knee/Hip Exercises: Standing     SLS  30 seconds plus              PT Education - 07/19/17 1059    Education provided  Yes    Education Details  HEP    Person(s) Educated  Patient    Methods  Explanation;Handout    Comprehension  Verbalized understanding          PT Long Term Goals - 07/19/17 1034      PT LONG TERM GOAL #1   Title  She will be independent with all HEp issued    Time  4    Period  Weeks    Status  Achieved      PT LONG TERM GOAL #2   Title  she will report Rt thigh symtoms decreasd 50% or more     Baseline  60% decreased    Time  4    Period  Weeks    Status  Achieved      PT LONG TERM GOAL #3   Baseline  17% limited     Time  4    Period  Weeks    Status  Achieved      PT LONG TERM GOAL #4   Title  BERG score will improve by 3-5 points to demo improved balance    Baseline  BERG 56/56 at initial test     Time  4    Period  Weeks    Status  Deferred            Plan - 07/19/17 1020    Clinical Impression Statement  Pt reports 60% decrease in N/T symptoms in her thigh. Her FOTO score improved  to 17% limited.  She is independent with her HEP. She has met all LTGS and is agreeable to discharge to HEP.     PT Next Visit Plan  discharge today    PT Home Exercise Plan  knee to RT and LT shoulder stretch, hamstring stretch, hip flexor stretch, abdominal draw in, standing balsnce single leg and tandem, abdominal draw in with marching , SLR     Consulted and Agree with Plan of Care  Patient       Patient will benefit from skilled therapeutic intervention in order to improve the following deficits and impairments:  Pain, Postural dysfunction, Decreased balance, Decreased range of motion  Visit Diagnosis: Pain in right thigh  Stiffness of right hip, not elsewhere classified  Unsteadiness on feet     Problem List Patient Active Problem List   Diagnosis Date Noted  . Benign skin lesion of face 11/02/2016  . Chronic idiopathic constipation 11/02/2016  . Obesity, Class I, BMI 30.0-34.9 (see actual BMI) 09/20/2016  . Spinal stenosis, lumbar region with neurogenic claudication 06/28/2016  . Meralgia paresthetica, right 05/30/2016  . Hyperglycemia 11/27/2012  . Vitamin D deficiency 03/05/2008  . Hyperlipidemia with target LDL less than 100 11/16/2007  . Hypothyroidism 04/03/2007  . B12 deficiency 04/03/2007  . GAD (generalized anxiety disorder) 04/03/2007  . PSEUDOTUMOR CEREBRI 04/03/2007  . CARPAL TUNNEL SYNDROME, BILATERAL 04/03/2007  . IRITIS 04/03/2007  . Essential hypertension 04/03/2007  . History of DVT of lower extremity 04/03/2007  . Venous (peripheral) insufficiency 04/03/2007  . REFLUX ESOPHAGITIS 04/03/2007  . IRRITABLE BOWEL SYNDROME 04/03/2007  . DEGENERATIVE DISC DISEASE, CERVICAL SPINE 04/03/2007    Donoho, Jessica McGee, PTA 07/19/2017, 11:00 AM  [facility] [address] [place], [place], [address] Phone: [phone]   Fax:  [phone]    Name: Katie [name] MRN: [id number] Date of Birth:  [birth year]  PHYSICAL THERAPY DISCHARGE SUMMARY  Visits from Start of Care: 8  Current functional level related to goals / functional outcomes: See above . Significant improvement in Rt thigh symptoms now intermittant   Remaining deficits: See above   Education / Equipment: HEP  Plan: Patient agrees to discharge.  Patient goals were met. Patient is being discharged due to meeting the stated rehab goals.  ?????    Pearson Forster  Pt  07/19/17

## 2017-07-30 LAB — HM DIABETES EYE EXAM

## 2017-07-31 ENCOUNTER — Encounter: Payer: Self-pay | Admitting: Internal Medicine

## 2017-07-31 ENCOUNTER — Ambulatory Visit: Payer: Medicare Other | Admitting: Internal Medicine

## 2017-07-31 ENCOUNTER — Ambulatory Visit (INDEPENDENT_AMBULATORY_CARE_PROVIDER_SITE_OTHER)
Admission: RE | Admit: 2017-07-31 | Discharge: 2017-07-31 | Disposition: A | Discharge status: Home / Self Care | Payer: Medicare Other | Source: Ambulatory Visit | Attending: Internal Medicine | Admitting: Internal Medicine

## 2017-07-31 VITALS — BP 150/90 | HR 63 | Temp 97.2°F | Resp 16 | Ht 66.0 in | Wt 185.1 lb

## 2017-07-31 DIAGNOSIS — M79651 Pain in right thigh: Secondary | ICD-10-CM

## 2017-07-31 DIAGNOSIS — G8929 Other chronic pain: Secondary | ICD-10-CM | POA: Diagnosis not present

## 2017-07-31 DIAGNOSIS — M25551 Pain in right hip: Secondary | ICD-10-CM

## 2017-07-31 DIAGNOSIS — M1611 Unilateral primary osteoarthritis, right hip: Secondary | ICD-10-CM | POA: Insufficient documentation

## 2017-07-31 NOTE — Patient Instructions (Signed)
Hip Bursitis Hip bursitis is inflammation of a fluid-filled sac (bursa) in the hip joint. The bursa protects the bones in the hip joint from rubbing against each other. Hip bursitis can cause mild to moderate pain, and symptoms often come and go over time. What are the causes? This condition may be caused by:  Injury to the hip.  Overuse of the muscles that surround the hip joint.  Arthritis or gout.  Diabetes.  Thyroid disease.  Cold weather.  Infection.  In some cases, the cause may not be known. What are the signs or symptoms? Symptoms of this condition may include:  Mild or moderate pain in the hip area. Pain may get worse with movement.  Tenderness and swelling of the hip, especially on the outer side of the hip.  Symptoms may come and go. If the bursa becomes infected, you may have the following symptoms:  Fever.  Red skin and a feeling of warmth in the hip area.  How is this diagnosed? This condition may be diagnosed based on:  A physical exam.  Your medical history.  X-rays.  Removal of fluid from your inflamed bursa for testing (biopsy).  You may be sent to a health care provider who specializes in bone diseases (orthopedist) or a provider who specializes in joint inflammation (rheumatologist). How is this treated? This condition is treated by resting, raising (elevating), and applying pressure(compression) to the injured area. In some cases, this may be enough to make your symptoms go away. Treatment may also include:  Crutches.  Antibiotic medicine.  Draining fluid out of the bursa to help relieve swelling.  Injecting medicine that helps to reduce inflammation (cortisone).  Follow these instructions at home: Medicines  Take over-the-counter and prescription medicines only as told by your health care provider.  Do not drive or operate heavy machinery while taking prescription pain medicine, or as told by your health care provider.  If you were  prescribed an antibiotic, take it as told by your health care provider. Do not stop taking the antibiotic even if you start to feel better. Activity  Return to your normal activities as told by your health care provider. Ask your health care provider what activities are safe for you.  Rest and protect your hip as much as possible until your pain and swelling get better. General instructions  Wear compression wraps only as told by your health care provider.  Elevate your hip above the level of your heart as much as you can without pain. To do this, try putting a pillow under your hips while you lie down.  Do not use your hip to support your body weight until your health care provider says that you can. Use crutches as told by your health care provider.  Gently massage and stretch your injured area as often as is comfortable.  Keep all follow-up visits as told by your health care provider. This is important. How is this prevented?  Exercise regularly, as told by your health care provider.  Warm up and stretch before being active.  Cool down and stretch after being active.  If an activity irritates your hip or causes pain, avoid the activity as much as possible.  Avoid sitting down for long periods at a time. Contact a health care provider if:  You have a fever.  You develop new symptoms.  You have difficulty walking or doing everyday activities.  You have pain that gets worse or does not get better with medicine.  You   develop red skin or a feeling of warmth in your hip area. Get help right away if:  You cannot move your hip.  You have severe pain. This information is not intended to replace advice given to you by your health care provider. Make sure you discuss any questions you have with your health care provider. Document Released: 10/21/2001 Document Revised: 10/07/2015 Document Reviewed: 12/01/2014 Elsevier Interactive Patient Education  2018 Elsevier Inc.  

## 2017-07-31 NOTE — Progress Notes (Signed)
Subjective:  Patient ID: Katie [name], female    DOB: 11-Aug-1945  Age: 72 y.o. MRN: [id number]  CC: Osteoarthritis   HPI Katie [name] presents for ongoing concerns about right hip and right thigh pain.  She recently completed 8 sessions of physical therapy for the treatment of meralgia paresthetica.  She said that that helped some but now she tells me the pain is returning.  She describes an aching discomfort in her right hip and right thigh that occurs with weightbearing activity.  She denies numbness, weakness, tingling in her lower extremities.  She is taking over-the-counter medications for symptom relief.  Outpatient Medications Prior to Visit  Medication Sig Dispense Refill  . ACCU-CHEK AVIVA PLUS test strip     . ALPRAZolam (XANAX) 0.5 MG tablet TAKE 1 TABLET BY MOUTH 3  TIMES DAILY AS NEEDED FOR  ANXIETY OR SLEEP 270 tablet 0  . aspirin 81 MG EC tablet Take 81 mg by mouth daily.      . Blood Glucose Calibration (ACCU-CHEK GUIDE CONTROL) LIQD     . Blood Glucose Monitoring Suppl (ACCU-CHEK AVIVA PLUS) W/DEVICE KIT     . cyanocobalamin (,VITAMIN B-12,) 1000 MCG/ML injection 1000 mcg subq once a month 30 mL 1  . ergocalciferol (VITAMIN D2) 50000 UNITS capsule Take 50,000 Units by mouth once a week.      . furosemide (LASIX) 20 MG tablet TAKE 1 TABLET BY MOUTH ONCE DAILY 90 tablet 1  . gabapentin (NEURONTIN) 300 MG capsule Take 1 capsule (300 mg total) by mouth at bedtime. 90 capsule 1  . [employer] (SIMPLE DIAGNOSTICS LANCING DEV) MISC     . levothyroxine (SYNTHROID, LEVOTHROID) 88 MCG tablet Take 88 mcg by mouth daily.      Marland Kitchen linaclotide (LINZESS) 72 MCG capsule Take 1 capsule (72 mcg total) by mouth daily before breakfast. 90 capsule 1  . lisinopril (PRINIVIL,ZESTRIL) 10 MG tablet Take 1 tablet (10 mg total) by mouth daily. 90 tablet 1  . TRULICITY 1.5 EL/3.8BO SOPN     . vitamin E 100 UNIT capsule Take 400 Units by mouth daily.     No facility-administered medications prior to  visit.     ROS Review of Systems  Constitutional: Negative.  Negative for chills, diaphoresis, fatigue and fever.  HENT: Negative.   Eyes: Negative for visual disturbance.  Respiratory: Negative for cough, chest tightness, shortness of breath and wheezing.   Cardiovascular: Negative for chest pain, palpitations and leg swelling.  Gastrointestinal: Negative for abdominal pain, diarrhea, nausea and vomiting.  Endocrine: Negative.   Genitourinary: Negative.  Negative for difficulty urinating, dysuria and hematuria.  Musculoskeletal: Positive for arthralgias. Negative for back pain, myalgias and neck pain.  Skin: Negative.  Negative for color change and pallor.  Neurological: Negative.  Negative for dizziness, weakness, light-headedness and numbness.  Hematological: Negative for adenopathy. Does not bruise/bleed easily.  Psychiatric/Behavioral: Negative.     Objective:  BP (!) 150/90 (BP Location: Left Arm, Patient Position: Sitting, Cuff Size: Large)   Pulse 63   Temp (!) 97.2 F (36.2 C) (Oral)   Resp 16   Ht 5' 6"  (1.676 m)   Wt 185 lb 1.9 oz (84 kg)   SpO2 94%   BMI 29.88 kg/m   BP Readings from Last 3 Encounters:  07/31/17 (!) 150/90  06/18/17 140/90  05/21/17 128/80    Wt Readings from Last 3 Encounters:  07/31/17 185 lb 1.9 oz (84 kg)  06/18/17 187 lb 4 oz (84.9 kg)  05/21/17 184 lb 1.3 oz (83.5 kg)    Physical Exam  HENT:  Mouth/Throat: Oropharynx is clear and moist. No oropharyngeal exudate.  Eyes: Conjunctivae are normal. Left eye exhibits no discharge. No scleral icterus.  Neck: Neck supple. No JVD present. No thyromegaly present.  Cardiovascular: Normal rate, regular rhythm and normal heart sounds. Exam reveals no gallop.  No murmur heard. Pulmonary/Chest: Effort normal and breath sounds normal. No respiratory distress. She has no wheezes. She has no rales.  Abdominal: Soft. Bowel sounds are normal. She exhibits no distension and no mass. There is no  tenderness. There is no guarding.  Musculoskeletal: Normal range of motion. She exhibits no edema or tenderness.       Right hip: Normal. She exhibits normal range of motion, normal strength, no tenderness, no bony tenderness, no swelling, no crepitus and no deformity.       Lumbar back: Normal. She exhibits normal range of motion, no tenderness and no bony tenderness.       Right upper leg: Normal. She exhibits no tenderness, no bony tenderness, no swelling, no edema and no deformity.  Lymphadenopathy:    She has no cervical adenopathy.  Skin: She is not diaphoretic.  Vitals reviewed.   Lab Results  Component Value Date   WBC 4.6 12/13/2015   HGB 13.3 12/13/2015   HCT 38.9 12/13/2015   PLT 218.0 12/13/2015   GLUCOSE 100 (H) 12/13/2015   CHOL 127 05/18/2017   TRIG 77 05/18/2017   HDL 65 05/18/2017   LDLDIRECT 105.9 07/04/2007   LDLCALC 27 05/18/2017   ALT 16 11/13/2016   AST 30 11/13/2016   NA 140 05/18/2017   K 4.4 05/18/2017   CL 104 12/13/2015   CREATININE 1.2 (A) 05/18/2017   BUN 16 05/18/2017   CO2 33 (H) 12/13/2015   TSH 0.84 05/18/2017   HGBA1C 5.6 05/18/2017   MICROALBUR 0.0 05/18/2017    Dg Hip Unilat With Pelvis 2-3 Views Right  Result Date: 07/31/2017 CLINICAL DATA:  Right leg pain with weight-bearing, initial encounter EXAM: DG HIP (WITH OR WITHOUT PELVIS) 2V RIGHT COMPARISON:  None. FINDINGS: Degenerative changes of the right hip joint are noted. Some overhanging osteophytes are noted from the femoral head. Pelvic ring as visualized is intact. No soft tissue changes are seen. Degenerative change in the lower lumbar spine is noted. IMPRESSION: Chronic changes without acute abnormality. Electronically Signed   By: Inez Catalina M.D.   On: 07/31/2017 11:34   Dg Femur, Min 2 Views Right  Result Date: 07/31/2017 CLINICAL DATA:  Right leg pain with weight-bearing, initial encounter EXAM: RIGHT FEMUR 2 VIEWS COMPARISON:  None. FINDINGS: Mild degenerative changes are  noted in the hip and knee joints. No definitive fracture or dislocation is seen. No other focal abnormality is noted. IMPRESSION: Degenerative change without acute abnormality. Electronically Signed   By: Inez Catalina M.D.   On: 07/31/2017 11:37    Assessment & Plan:   Nury was seen today for osteoarthritis.  Diagnoses and all orders for this visit:  Chronic right hip pain- Her symptoms, exam, and plain films are consistent with osteoarthritis with spurring at the femoral head. -     DG HIP UNILAT WITH PELVIS 2-3 VIEWS RIGHT; Future  Pain in right thigh- As above -     DG FEMUR, MIN 2 VIEWS RIGHT; Future  Primary osteoarthritis of right hip- I have asked her to see orthopedics to see if she would benefit from a surgical intervention such  as total hip replacement. -     Ambulatory referral to Orthopedic Surgery   I am having Derl Barrow maintain her aspirin, levothyroxine, ergocalciferol, vitamin E, ACCU-CHEK AVIVA PLUS, ACCU-CHEK AVIVA PLUS, SIMPLE DIAGNOSTICS LANCING DEV, gabapentin, linaclotide, furosemide, ACCU-CHEK GUIDE CONTROL, TRULICITY, ALPRAZolam, lisinopril, and cyanocobalamin.  No orders of the defined types were placed in this encounter.    Follow-up: Return if symptoms worsen or fail to improve.  Scarlette Calico, MD

## 2017-07-31 NOTE — Progress Notes (Deleted)
 Subjective:  Patient ID: Katie [name], female    DOB: 07/15/1945  Age: 72 y.o. MRN: [id number]  CC: Osteoarthritis   HPI Katie [name] presents for ***  Outpatient Medications Prior to Visit  Medication Sig Dispense Refill  . ACCU-CHEK AVIVA PLUS test strip     . ALPRAZolam (XANAX) 0.5 MG tablet TAKE 1 TABLET BY MOUTH 3  TIMES DAILY AS NEEDED FOR  ANXIETY OR SLEEP 270 tablet 0  . aspirin 81 MG EC tablet Take 81 mg by mouth daily.      . Blood Glucose Calibration (ACCU-CHEK GUIDE CONTROL) LIQD     . Blood Glucose Monitoring Suppl (ACCU-CHEK AVIVA PLUS) W/DEVICE KIT     . cyanocobalamin (,VITAMIN B-12,) 1000 MCG/ML injection 1000 mcg subq once a month 30 mL 1  . ergocalciferol (VITAMIN D2) 50000 UNITS capsule Take 50,000 Units by mouth once a week.      . furosemide (LASIX) 20 MG tablet TAKE 1 TABLET BY MOUTH ONCE DAILY 90 tablet 1  . gabapentin (NEURONTIN) 300 MG capsule Take 1 capsule (300 mg total) by mouth at bedtime. 90 capsule 1  . Lancet Devices (SIMPLE DIAGNOSTICS LANCING DEV) MISC     . levothyroxine (SYNTHROID, LEVOTHROID) 88 MCG tablet Take 88 mcg by mouth daily.      . linaclotide (LINZESS) 72 MCG capsule Take 1 capsule (72 mcg total) by mouth daily before breakfast. 90 capsule 1  . lisinopril (PRINIVIL,ZESTRIL) 10 MG tablet Take 1 tablet (10 mg total) by mouth daily. 90 tablet 1  . TRULICITY 1.5 MG/0.5ML SOPN     . vitamin E 100 UNIT capsule Take 400 Units by mouth daily.     No facility-administered medications prior to visit.     ROS Review of Systems  Objective:  BP (!) 150/90 (BP Location: Left Arm, Patient Position: Sitting, Cuff Size: Large)   Pulse 63   Temp (!) 97.2 F (36.2 C) (Oral)   Ht 5' 6" (1.676 m)   Wt 185 lb 1.9 oz (84 kg)   SpO2 94%   BMI 29.88 kg/m   BP Readings from Last 3 Encounters:  07/31/17 (!) 150/90  06/18/17 140/90  05/21/17 128/80    Wt Readings from Last 3 Encounters:  07/31/17 185 lb 1.9 oz (84 kg)  06/18/17 187 lb 4 oz (84.9  kg)  05/21/17 184 lb 1.3 oz (83.5 kg)    Physical Exam  Lab Results  Component Value Date   WBC 4.6 12/13/2015   HGB 13.3 12/13/2015   HCT 38.9 12/13/2015   PLT 218.0 12/13/2015   GLUCOSE 100 (H) 12/13/2015   CHOL 127 05/18/2017   TRIG 77 05/18/2017   HDL 65 05/18/2017   LDLDIRECT 105.9 07/04/2007   LDLCALC 27 05/18/2017   ALT 16 11/13/2016   AST 30 11/13/2016   NA 140 05/18/2017   K 4.4 05/18/2017   CL 104 12/13/2015   CREATININE 1.2 (A) 05/18/2017   BUN 16 05/18/2017   CO2 33 (H) 12/13/2015   TSH 0.84 05/18/2017   HGBA1C 5.6 05/18/2017   MICROALBUR 0.0 05/18/2017    No results found.  Assessment & Plan:   Katie [name] was seen today for osteoarthritis.  Diagnoses and all orders for this visit:  Chronic right hip pain -     DG HIP UNILAT WITH PELVIS 2-3 VIEWS RIGHT; Future  Pain in right thigh -     DG FEMUR, MIN 2 VIEWS RIGHT; Future  Primary osteoarthritis of right hip -       Ambulatory referral to Orthopedic Surgery   I am having Derl Barrow maintain her aspirin, levothyroxine, ergocalciferol, vitamin E, ACCU-CHEK AVIVA PLUS, ACCU-CHEK AVIVA PLUS, SIMPLE DIAGNOSTICS LANCING DEV, gabapentin, linaclotide, furosemide, ACCU-CHEK GUIDE CONTROL, TRULICITY, ALPRAZolam, lisinopril, and cyanocobalamin.  No orders of the defined types were placed in this encounter.    Follow-up: Return if symptoms worsen or fail to improve.  Scarlette Calico, MD

## 2017-08-02 ENCOUNTER — Encounter: Payer: Self-pay | Admitting: Internal Medicine

## 2017-08-14 ENCOUNTER — Telehealth: Payer: Self-pay | Admitting: Internal Medicine

## 2017-08-14 NOTE — Telephone Encounter (Signed)
Pt.notified

## 2017-08-14 NOTE — Telephone Encounter (Unsigned)
Copied from [place] [id number]. Topic: Quick Communication - Rx Refill/Question >> Aug 14, 2017  2:45 PM Neva Seat wrote:  rosuvastatin 10 mg  - pt is needing to know if she needs to continue to taking the medication. She was thinking Dr. Ronnald Ramp wanted her to stop and she is currently out. Please call pt back asap.

## 2017-09-12 LAB — LIPID PANEL
Cholesterol: 125 (ref 0–200)
HDL: 55 (ref 35–70)
LDL Cholesterol: 125
Triglycerides: 78 (ref 40–160)

## 2017-09-12 LAB — BASIC METABOLIC PANEL
BUN: 27 — AB (ref 4–21)
Creatinine: 1.4 — AB (ref 0.5–1.1)
Glucose: 114
Potassium: 4.7 (ref 3.4–5.3)
Sodium: 138 (ref 137–147)

## 2017-09-12 LAB — TSH: TSH: 1.33 (ref 0.41–5.90)

## 2017-09-12 LAB — HEMOGLOBIN A1C: Hemoglobin A1C: 6

## 2017-09-25 ENCOUNTER — Other Ambulatory Visit: Payer: Self-pay | Admitting: Sports Medicine

## 2017-09-25 DIAGNOSIS — M545 Low back pain: Secondary | ICD-10-CM

## 2017-09-28 ENCOUNTER — Other Ambulatory Visit: Payer: Self-pay | Admitting: Internal Medicine

## 2017-09-28 DIAGNOSIS — F411 Generalized anxiety disorder: Secondary | ICD-10-CM

## 2017-09-28 NOTE — Telephone Encounter (Signed)
Rx refill request: Xanax 0.5 mg   Last filled: 06/27/17  # [id number]  LOV: 07/31/17  PCP: [place]: Claria Dice

## 2017-09-28 NOTE — Telephone Encounter (Signed)
Copied from [place] [id number]. Topic: Quick Communication - Rx Refill/Question >> Sep 28, 2017  2:21 PM Cleaster Corin, [place] wrote: Medication:ALPRAZolam Duanne Moron) 0.5 MG tablet [[id number]]    Has the patient contacted their pharmacy? yes (Agent: If no, request that the patient contact the pharmacy for the refill.) (Agent: If yes, when and what did the pharmacy advise?)  Preferred Pharmacy (with phone number or street name): [facility] [place] [facility] [address] [place] [address] [place] [address] Phone: [phone] Fax: [phone]    Agent: Please be advised that RX refills may take up to 3 business days. We ask that you follow-up with your pharmacy.

## 2017-09-29 MED ORDER — ALPRAZOLAM 0.5 MG PO TABS: ORAL_TABLET | ORAL | 1 refills | Status: DC

## 2017-10-04 ENCOUNTER — Ambulatory Visit
Admission: RE | Admit: 2017-10-04 | Discharge: 2017-10-04 | Disposition: A | Discharge status: Home / Self Care | Payer: Medicare Other | Source: Ambulatory Visit | Attending: Sports Medicine | Admitting: Sports Medicine

## 2017-10-04 DIAGNOSIS — M545 Low back pain: Secondary | ICD-10-CM

## 2017-10-20 ENCOUNTER — Other Ambulatory Visit: Payer: Self-pay | Admitting: Internal Medicine

## 2017-10-20 DIAGNOSIS — I10 Essential (primary) hypertension: Secondary | ICD-10-CM

## 2017-12-16 ENCOUNTER — Other Ambulatory Visit: Payer: Self-pay | Admitting: Internal Medicine

## 2017-12-16 DIAGNOSIS — F411 Generalized anxiety disorder: Secondary | ICD-10-CM

## 2017-12-17 ENCOUNTER — Ambulatory Visit: Payer: Medicare Other | Admitting: Internal Medicine

## 2017-12-24 ENCOUNTER — Ambulatory Visit: Payer: Medicare Other | Admitting: Internal Medicine

## 2018-01-15 LAB — HEPATIC FUNCTION PANEL
ALT: 12 (ref 7–35)
AST: 22 (ref 13–35)
Alkaline Phosphatase: 51 (ref 25–125)
Bilirubin, Total: 0.9

## 2018-01-15 LAB — BASIC METABOLIC PANEL
BUN: 12 (ref 4–21)
Creatinine: 1.3 — AB (ref 0.5–1.1)
Glucose: 103
Potassium: 4.7 (ref 3.4–5.3)
Sodium: 141 (ref 137–147)

## 2018-01-15 LAB — LIPID PANEL
Cholesterol: 125 (ref 0–200)
HDL: 59 (ref 35–70)
LDL Cholesterol: 30
Triglycerides: 72 (ref 40–160)

## 2018-01-15 LAB — TSH: TSH: 1.36 (ref 0.41–5.90)

## 2018-01-15 LAB — HEMOGLOBIN A1C: Hemoglobin A1C: 5.2

## 2018-01-22 ENCOUNTER — Other Ambulatory Visit: Payer: Self-pay | Admitting: Internal Medicine

## 2018-01-22 DIAGNOSIS — I10 Essential (primary) hypertension: Secondary | ICD-10-CM

## 2018-01-24 ENCOUNTER — Ambulatory Visit (INDEPENDENT_AMBULATORY_CARE_PROVIDER_SITE_OTHER)
Admission: RE | Admit: 2018-01-24 | Discharge: 2018-01-24 | Disposition: A | Discharge status: Home / Self Care | Payer: Medicare Other | Source: Ambulatory Visit | Attending: Internal Medicine | Admitting: Internal Medicine

## 2018-01-24 ENCOUNTER — Ambulatory Visit: Payer: Medicare Other | Admitting: Internal Medicine

## 2018-01-24 ENCOUNTER — Encounter: Payer: Self-pay | Admitting: Internal Medicine

## 2018-01-24 VITALS — BP 124/80 | HR 83 | Temp 98.2°F | Resp 16 | Ht 66.0 in | Wt 175.0 lb

## 2018-01-24 DIAGNOSIS — R6881 Early satiety: Secondary | ICD-10-CM

## 2018-01-24 DIAGNOSIS — R739 Hyperglycemia, unspecified: Secondary | ICD-10-CM

## 2018-01-24 DIAGNOSIS — R0789 Other chest pain: Secondary | ICD-10-CM | POA: Diagnosis not present

## 2018-01-24 DIAGNOSIS — E039 Hypothyroidism, unspecified: Secondary | ICD-10-CM | POA: Diagnosis not present

## 2018-01-24 DIAGNOSIS — E785 Hyperlipidemia, unspecified: Secondary | ICD-10-CM | POA: Diagnosis not present

## 2018-01-24 DIAGNOSIS — G8929 Other chronic pain: Secondary | ICD-10-CM

## 2018-01-24 DIAGNOSIS — N183 Chronic kidney disease, stage 3 unspecified: Secondary | ICD-10-CM | POA: Insufficient documentation

## 2018-01-24 DIAGNOSIS — I10 Essential (primary) hypertension: Secondary | ICD-10-CM | POA: Diagnosis not present

## 2018-01-24 DIAGNOSIS — K581 Irritable bowel syndrome with constipation: Secondary | ICD-10-CM

## 2018-01-24 DIAGNOSIS — R1011 Right upper quadrant pain: Secondary | ICD-10-CM

## 2018-01-24 MED ORDER — LINACLOTIDE 290 MCG PO CAPS: 290.0000 ug | ORAL_CAPSULE | Freq: Every day | ORAL | 1 refills | Status: DC

## 2018-01-24 NOTE — Patient Instructions (Signed)

## 2018-01-24 NOTE — Progress Notes (Signed)
Subjective:  Patient ID: Katie [name], female    DOB: 1945-07-12  Age: 72 y.o. MRN: [id number]  CC: Constipation; Abdominal Pain; and Chest Pain   HPI Tarshia Kot presents for f/up - She complains of a one-month history of right upper, anterior chest wall pain.  The chest pain is worsened by palpation and movement.  She struggles with chronic laryngitis but she denies any episodes of heartburn, odynophagia, or dysphagia.  She complains of early satiety, weight loss, and chronic constipation.  She is using magnesium citrate for the constipation.  She does not experience diarrhea and has not noticed blood in her stools.  She also complains of abdominal bloating.  She experiences right upper quadrant abdominal pain after eating.  Outpatient Medications Prior to Visit  Medication Sig Dispense Refill  . ACCU-CHEK AVIVA PLUS test strip     . ALPRAZolam (XANAX) 0.5 MG tablet TAKE 1 TABLET BY MOUTH 3  TIMES DAILY AS NEEDED FOR  ANXIETY OR SLEEP 270 tablet 0  . Blood Glucose Calibration (ACCU-CHEK GUIDE CONTROL) LIQD     . Blood Glucose Monitoring Suppl (ACCU-CHEK AVIVA PLUS) W/DEVICE KIT     . cyanocobalamin (,VITAMIN B-12,) 1000 MCG/ML injection 1000 mcg subq once a month 30 mL 1  . ergocalciferol (VITAMIN D2) 50000 UNITS capsule Take 50,000 Units by mouth once a week.      . furosemide (LASIX) 20 MG tablet TAKE 1 TABLET BY MOUTH ONCE DAILY 90 tablet 1  . gabapentin (NEURONTIN) 300 MG capsule Take 1 capsule (300 mg total) by mouth at bedtime. 90 capsule 1  . Lancet Devices (SIMPLE DIAGNOSTICS LANCING DEV) MISC     . levothyroxine (SYNTHROID, LEVOTHROID) 88 MCG tablet Take 88 mcg by mouth daily.      Marland Kitchen lisinopril (PRINIVIL,ZESTRIL) 10 MG tablet TAKE 1 TABLET BY MOUTH ONCE DAILY 90 tablet 1  . vitamin E 100 UNIT capsule Take 400 Units by mouth daily.    Marland Kitchen ALPRAZolam (XANAX) 0.5 MG tablet TAKE 1 TABLET BY MOUTH 3  TIMES DAILY AS NEEDED FOR  ANXIETY OR SLEEP 270 tablet 1  . aspirin 81 MG EC tablet Take  81 mg by mouth daily.      Marland Kitchen linaclotide (LINZESS) 72 MCG capsule Take 1 capsule (72 mcg total) by mouth daily before breakfast. 90 capsule 1  . TRULICITY 1.5 AY/0.4HT SOPN      No facility-administered medications prior to visit.     ROS Review of Systems  Constitutional: Positive for unexpected weight change. Negative for chills, diaphoresis and fatigue.  HENT: Positive for voice change. Negative for sinus pressure and trouble swallowing.   Eyes: Negative.   Respiratory: Negative for cough, chest tightness, shortness of breath and wheezing.   Cardiovascular: Positive for chest pain.  Gastrointestinal: Positive for abdominal pain and constipation. Negative for abdominal distention, diarrhea, nausea and vomiting.  Endocrine: Negative.  Negative for polydipsia, polyphagia and polyuria.  Genitourinary: Negative.  Negative for decreased urine volume, difficulty urinating, hematuria and urgency.  Musculoskeletal: Negative.  Negative for arthralgias and myalgias.  Skin: Negative.   Allergic/Immunologic: Negative.   Neurological: Negative.  Negative for dizziness, weakness, light-headedness and headaches.  Hematological: Negative for adenopathy. Does not bruise/bleed easily.  Psychiatric/Behavioral: Negative.     Objective:  BP 124/80 (BP Location: Left Arm, Patient Position: Sitting, Cuff Size: Normal)   Pulse 83   Temp 98.2 F (36.8 C) (Oral)   Resp 16   Ht 5' 6"  (1.676 m)   Wt 175  lb (79.4 kg)   SpO2 98%   BMI 28.25 kg/m   BP Readings from Last 3 Encounters:  01/24/18 124/80  07/31/17 (!) 150/90  06/18/17 140/90    Wt Readings from Last 3 Encounters:  01/24/18 175 lb (79.4 kg)  07/31/17 185 lb 1.9 oz (84 kg)  06/18/17 187 lb 4 oz (84.9 kg)    Physical Exam  Constitutional: She is oriented to person, place, and time. She appears well-developed and well-nourished.  Non-toxic appearance. She does not appear ill. No distress.  HENT:  Mouth/Throat: No oropharyngeal  exudate.  Eyes: No scleral icterus.  Cardiovascular: Normal rate, regular rhythm, normal heart sounds and intact distal pulses. Exam reveals no friction rub.  Pulmonary/Chest: Effort normal and breath sounds normal. She has no wheezes. She has no rhonchi. She has no rales. She exhibits tenderness. She exhibits no mass, no crepitus, no edema, no deformity, no swelling and no retraction. Right breast exhibits tenderness. Right breast exhibits no inverted nipple, no mass, no nipple discharge and no skin change. Left breast exhibits no inverted nipple, no mass, no nipple discharge, no skin change and no tenderness. No breast swelling. Breasts are symmetrical.    Abdominal: Soft. Normal appearance and bowel sounds are normal. There is no hepatosplenomegaly. There is tenderness in the right upper quadrant. There is no rigidity, no guarding and no tenderness at McBurney's point. No hernia.  Musculoskeletal: Normal range of motion. She exhibits no edema, tenderness or deformity.  Neurological: She is alert and oriented to person, place, and time.  Skin: Skin is warm and dry. No pallor.  Psychiatric: She has a normal mood and affect. Her behavior is normal. Judgment and thought content normal.  Vitals reviewed.   Lab Results  Component Value Date   WBC 4.6 12/13/2015   HGB 13.3 12/13/2015   HCT 38.9 12/13/2015   PLT 218.0 12/13/2015   GLUCOSE 100 (H) 12/13/2015   CHOL 125 01/15/2018   TRIG 72 01/15/2018   HDL 59 01/15/2018   LDLDIRECT 105.9 07/04/2007   LDLCALC 30 01/15/2018   ALT 12 01/15/2018   AST 22 01/15/2018   NA 141 01/15/2018   K 4.7 01/15/2018   CL 104 12/13/2015   CREATININE 1.3 (A) 01/15/2018   BUN 12 01/15/2018   CO2 33 (H) 12/13/2015   TSH 1.36 01/15/2018   HGBA1C 5.2 01/15/2018   MICROALBUR 0.0 05/18/2017    Mr Lumbar Spine Wo Contrast  Result Date: 10/04/2017 CLINICAL DATA:  Chronic low back pain. Right lower extremity tingling for 7 months. EXAM: MRI LUMBAR SPINE  WITHOUT CONTRAST TECHNIQUE: Multiplanar, multisequence MR imaging of the lumbar spine was performed. No intravenous contrast was administered. COMPARISON:  03/15/2013 FINDINGS: Segmentation:  Standard. Alignment: 5 mm anterolisthesis of L4 on L5 and 2 mm anterolisthesis of L5 on S1, not significantly changed. Vertebrae: Chronic L2 vertebral body fracture. No new fracture, suspicious osseous lesion, or significant marrow edema. Mild degenerative endplate marrow changes at L4-5 and L5-S1. Conus medullaris and cauda equina: Conus extends to the L1 level. Conus and cauda equina appear normal. Paraspinal and other soft tissues: Unremarkable. Disc levels: Disc desiccation throughout the lumbar spine. Unchanged moderate disc space narrowing at L1-2, mild narrowing at L4-5, and severe narrowing at L5-S1. T11-12: Only imaged sagittally. Minimal disc bulging and mild facet arthrosis without significant stenosis, unchanged. T12-L1: Negative. L1-2: Mild disc bulging and mild facet and ligamentum flavum hypertrophy without significant stenosis, unchanged. L2-3: Mild disc bulging and mild facet and ligamentum flavum  hypertrophy result in minimal bilateral neural foraminal narrowing without spinal stenosis, unchanged. L3-4: Mild disc bulging and mild facet and ligamentum flavum hypertrophy result in at most minimal lateral recess and neural foraminal narrowing bilaterally, unchanged. No spinal stenosis pre L4-5: Anterolisthesis, bulging uncovered disc, central pseudo disc protrusion, mild ligamentum flavum thickening, and severe facet arthrosis result in mild spinal stenosis, mild bilateral lateral recess stenosis, and moderate bilateral neural foraminal stenosis, unchanged. The L4 nerve roots may be affected bilaterally. Facet joint effusions on the prior study have resolved. L5-S1: Anterolisthesis with disc uncovering, disc space height loss, and moderate to severe right and mild left facet arthrosis result in mild bilateral  lateral recess stenosis and mild-to-moderate bilateral neural foraminal stenosis, unchanged. No spinal stenosis. IMPRESSION: 1. Multilevel lumbar disc and facet degeneration without significant interval change. 2. Mild spinal and moderate bilateral neural foraminal stenosis at L4-5. 3. Mild lateral recess and mild-to-moderate neural foraminal stenosis at L5-S1. Electronically Signed   By: Logan Bores M.D.   On: 10/04/2017 14:27    Assessment & Plan:   Danella was seen today for constipation, abdominal pain and chest pain.  Diagnoses and all orders for this visit:  Essential hypertension- Her blood pressure is well controlled.  Acquired hypothyroidism- Her recent TSH was in the normal range.  Hyperglycemia- She recently saw her endocrinologist and it was found that her A1c was down to 5.2%.  I have asked her to stop using the GLP-1 agonist.  Hyperlipidemia with target LDL less than 100- She has achieved her LDL goal and is doing well on the statin.  Chronic renal disease, stage 3, moderately decreased glomerular filtration rate (GFR) between 30-59 mL/min/1.73 square meter ([place])- Her renal function is stable.  She will avoid nephrotoxic agents.  Irritable bowel syndrome with constipation- Will treat this with Linzess. -     linaclotide (LINZESS) 290 MCG CAPS capsule; Take 1 capsule (290 mcg total) by mouth daily before breakfast.  Abdominal pain, chronic, right upper quadrant- Recent labs done by her endocrinologist showed a normal CBC and normal liver enzymes.  I have asked her to undergo an ultrasound to see if she has gallstones. -     Cancel: US Abdomen Complete; Future -     US Abdomen Complete; Future  Right-sided chest wall pain- Her symptoms, exam, and normal chest x-ray are consistent with benign chest wall pain.  I offered her reassurance. -     DG Chest 2 View; Future  Early satiety- She has unexplained weight loss and early satiety.  I have asked her to see GI to see if she  needs to undergo an upper endoscopy. -     Ambulatory referral to Gastroenterology   I have discontinued Aryannah Urich's aspirin, linaclotide, and TRULICITY. I am also having her start on linaclotide. Additionally, I am having her maintain her levothyroxine, ergocalciferol, vitamin E, ACCU-CHEK AVIVA PLUS, ACCU-CHEK AVIVA PLUS, SIMPLE DIAGNOSTICS LANCING DEV, gabapentin, ACCU-CHEK GUIDE CONTROL, cyanocobalamin, furosemide, ALPRAZolam, and lisinopril.  Meds ordered this encounter  Medications  . linaclotide (LINZESS) 290 MCG CAPS capsule    Sig: Take 1 capsule (290 mcg total) by mouth daily before breakfast.    Dispense:  90 capsule    Refill:  1     Follow-up: Return in about 4 weeks (around 02/21/2018).  Scarlette Calico, MD

## 2018-01-27 DIAGNOSIS — R6881 Early satiety: Secondary | ICD-10-CM | POA: Insufficient documentation

## 2018-02-06 ENCOUNTER — Ambulatory Visit
Admission: RE | Admit: 2018-02-06 | Discharge: 2018-02-06 | Disposition: A | Discharge status: Home / Self Care | Payer: Medicare Other | Source: Ambulatory Visit | Attending: Internal Medicine | Admitting: Internal Medicine

## 2018-02-06 ENCOUNTER — Other Ambulatory Visit: Payer: Self-pay | Admitting: Internal Medicine

## 2018-02-06 DIAGNOSIS — R1011 Right upper quadrant pain: Principal | ICD-10-CM

## 2018-02-06 DIAGNOSIS — G8929 Other chronic pain: Secondary | ICD-10-CM

## 2018-02-06 DIAGNOSIS — K8010 Calculus of gallbladder with chronic cholecystitis without obstruction: Secondary | ICD-10-CM

## 2018-02-12 ENCOUNTER — Telehealth: Payer: Self-pay | Admitting: Internal Medicine

## 2018-02-12 NOTE — Telephone Encounter (Signed)
Patient called in stating that she had an ultrasound done on 9/25.  States the ultrasound shown multiple gall stones.  States she is currently waiting on a call from the surgeon.

## 2018-02-13 NOTE — Telephone Encounter (Signed)
Referral has been sent to [facility] and pt is aware

## 2018-02-18 ENCOUNTER — Encounter: Payer: Self-pay | Admitting: Gastroenterology

## 2018-02-18 ENCOUNTER — Ambulatory Visit: Payer: Medicare Other | Admitting: Gastroenterology

## 2018-02-18 VITALS — BP 120/80 | HR 68 | Ht 64.25 in | Wt 176.1 lb

## 2018-02-18 DIAGNOSIS — K5900 Constipation, unspecified: Secondary | ICD-10-CM

## 2018-02-18 DIAGNOSIS — R634 Abnormal weight loss: Secondary | ICD-10-CM | POA: Diagnosis not present

## 2018-02-18 DIAGNOSIS — R6881 Early satiety: Secondary | ICD-10-CM

## 2018-02-18 NOTE — Patient Instructions (Addendum)
You have been given samples of Linzess 290 mcg.   You have been scheduled for an endoscopy. Please follow written instructions given to you at your visit today. If you use inhalers (even only as needed), please bring them with you on the day of your procedure. Your physician has requested that you go to www.startemmi.com and enter the access code given to you at your visit today. This web site gives a general overview about your procedure. However, you should still follow specific instructions given to you by our office regarding your preparation for the procedure.                _ _ OTHER NON-INSULIN INJECTABLE MEDICATIONS         (Tanzeum, Trulicity, Byetta, Victoza, Bydureon, SymlinPen)  Hold the am of the procedure      Normal BMI (Body Mass Index- based on height and weight) is between 23 and 30. Your BMI today is Body mass index is 30 kg/m. Marland Kitchen Please consider follow up  regarding your BMI with your Primary Care Provider.  Thank you for choosing me and [facility].  Pricilla Riffle. Dagoberto Ligas., MD., Marval Regal

## 2018-02-18 NOTE — Progress Notes (Signed)
History of Present Illness: This is a 72 year old female referred by Janith Lima, MD for the evaluation of early satiety, weight loss, constipation, epigastric pain, abdominal bloating, cholelithiasis.  She relates several months of early satiety, postprandial bloating, epigastric discomfort, and about a 10 pound weight loss.  She began Linzess 290 mcg daily about 1 month ago and her constipation and bloating have improved but have not resolved.  Recent abdominal ultrasound below.  Recent LFTs and TSH were normal. Denies diarrhea, change in stool caliber, melena, hematochezia, nausea, vomiting, dysphagia, reflux symptoms, chest pain.   Abd [name] 01/2018: Cholelithiasis with upper limits normal wall thickening. Fatty infiltration of the liver.   Allergies  Allergen Reactions  . Metoprolol Succinate     REACTION: ? rash from Toprol ?  Marland Kitchen Penicillins     REACTION: allergic to penicillin  . Sulfamethoxazole-Trimethoprim     Other reaction(s): Other (See Comments) Reaction not listed  . Metformin Nausea Only   Outpatient Medications Prior to Visit  Medication Sig Dispense Refill  . ACCU-CHEK AVIVA PLUS test strip     . ALPRAZolam (XANAX) 0.5 MG tablet TAKE 1 TABLET BY MOUTH 3  TIMES DAILY AS NEEDED FOR  ANXIETY OR SLEEP 270 tablet 0  . Blood Glucose Calibration (ACCU-CHEK GUIDE CONTROL) LIQD     . Blood Glucose Monitoring Suppl (ACCU-CHEK AVIVA PLUS) W/DEVICE KIT     . cyanocobalamin (,VITAMIN B-12,) 1000 MCG/ML injection 1000 mcg subq once a month 30 mL 1  . ergocalciferol (VITAMIN D2) 50000 UNITS capsule Take 50,000 Units by mouth once a week.      . furosemide (LASIX) 20 MG tablet TAKE 1 TABLET BY MOUTH ONCE DAILY 90 tablet 1  . Lancet Devices (SIMPLE DIAGNOSTICS LANCING DEV) MISC     . levothyroxine (SYNTHROID, LEVOTHROID) 88 MCG tablet Take 88 mcg by mouth daily.      Marland Kitchen linaclotide (LINZESS) 290 MCG CAPS capsule Take 1 capsule (290 mcg total) by mouth daily before breakfast. 90  capsule 1  . lisinopril (PRINIVIL,ZESTRIL) 10 MG tablet TAKE 1 TABLET BY MOUTH ONCE DAILY 90 tablet 1  . vitamin E 100 UNIT capsule Take 400 Units by mouth daily.    Marland Kitchen gabapentin (NEURONTIN) 300 MG capsule Take 1 capsule (300 mg total) by mouth at bedtime. 90 capsule 1   No facility-administered medications prior to visit.    Past Medical History:  Diagnosis Date  . Acute venous embolism and thrombosis of unspecified deep vessels of lower extremity 2001   RLE DVT and PE, coumadin x 63mo now ASSA  . Anxiety state, unspecified   . Chronic idiopathic constipation 11/02/2016  . Degeneration of cervical intervertebral disc   . Gallstones   . History of DVT (deep vein thrombosis)   . Iron deficiency anemia secondary to blood loss (chronic)   . Irritable bowel syndrome   . Lumbago   . Other B-complex deficiencies   . Personal history of colonic polyps   . Pure hypercholesterolemia   . Reflux esophagitis   . Tubular adenoma of colon 09/2002  . Type II or unspecified type diabetes mellitus without mention of complication, not stated as uncontrolled   . Unspecified essential hypertension   . Unspecified hypothyroidism   . Unspecified iridocyclitis   . Unspecified venous (peripheral) insufficiency   . Unspecified vitamin D deficiency    Past Surgical History:  Procedure Laterality Date  . CARPAL TUNNEL RELEASE    . FEMORAL HERNIA REPAIR    .  OOPHORECTOMY    . VESICOVAGINAL FISTULA CLOSURE W/ TAH     Social History   Socioeconomic History  . Marital status: Married    Spouse name: Myrissa Chipley  . Number of children: 0  . Years of education: masters  . Highest education level: Not on file  Occupational History  . Occupation: record specialist    Employer: [employer]  Social Needs  . Financial resource strain: Not on file  . Food insecurity:    Worry: Not on file    Inability: Not on file  . Transportation needs:    Medical: Not on file    Non-medical: Not on file  Tobacco  Use  . Smoking status: Never Smoker  . Smokeless tobacco: Never Used  Substance and Sexual Activity  . Alcohol use: Yes    Alcohol/week: 0.0 standard drinks    Comment: occasional  . Drug use: No  . Sexual activity: Not on file  Lifestyle  . Physical activity:    Days per week: Not on file    Minutes per session: Not on file  . Stress: Not on file  Relationships  . Social connections:    Talks on phone: Not on file    Gets together: Not on file    Attends religious service: Not on file    Active member of club or organization: Not on file    Attends meetings of clubs or organizations: Not on file    Relationship status: Not on file  Other Topics Concern  . Not on file  Social History Narrative   Lives with husband in a 2 story home with a basement.  Has no children.     Retired from the police department.     Education: masters (guidance and counseling).    Family History  Problem Relation Age of Onset  . Prostate cancer Father   . Diabetes Mother        retinopathy/blind  . Deep vein thrombosis Mother   . Hypertension Mother   . Healthy Sister        Review of Systems: Pertinent positive and negative review of systems were noted in the above HPI section. All other review of systems were otherwise negative.    Physical Exam: General: Well developed, well nourished, no acute distress Head: Normocephalic and atraumatic Eyes:  sclerae anicteric, EOMI Ears: Normal auditory acuity Mouth: No deformity or lesions Neck: Supple, no masses or thyromegaly Lungs: Clear throughout to auscultation Heart: Regular rate and rhythm; no murmurs, rubs or bruits Abdomen: Soft, mild epigastric tenderness and non distended. No masses, hepatosplenomegaly or hernias noted. Normal Bowel sounds Rectal: Not done Musculoskeletal: Symmetrical with no gross deformities  Skin: No lesions on visible extremities Pulses:  Normal pulses noted Extremities: No clubbing, cyanosis, edema or  deformities noted Neurological: Alert oriented x 4, grossly nonfocal Cervical Nodes:  No significant cervical adenopathy Inguinal Nodes: No significant inguinal adenopathy Psychological:  Alert and cooperative. Normal mood and affect   Assessment and Recommendations:  1. Early satiety, weight loss, constipation, epigastric pain, abdominal bloating, cholelithiasis.  Rule out GERD, ulcer, symptomatic cholelithiasis, constipation related symptoms.  Continue Linzess 290 mcg daily.  Add MiraLAX once daily.  Schedule EGD. The risks (including bleeding, perforation, infection, missed lesions, medication reactions and possible hospitalization or surgery if complications occur), benefits, and alternatives to endoscopy with possible biopsy and possible dilation were discussed with the patient and they consent to proceed.  Proceed with surgical referral if EGD is not diagnostic  and symptoms do not respond to improved management of constipation.    cc: Janith Lima, MD [address] [place], [place] [address]

## 2018-02-21 ENCOUNTER — Encounter: Payer: Self-pay | Admitting: Gastroenterology

## 2018-02-25 ENCOUNTER — Ambulatory Visit: Payer: Medicare Other | Admitting: Internal Medicine

## 2018-02-25 ENCOUNTER — Encounter: Payer: Self-pay | Admitting: Internal Medicine

## 2018-02-25 VITALS — BP 140/80 | HR 63 | Temp 98.3°F | Resp 16 | Ht 64.25 in | Wt 178.2 lb

## 2018-02-25 DIAGNOSIS — E0841 Diabetes mellitus due to underlying condition with diabetic mononeuropathy: Secondary | ICD-10-CM | POA: Diagnosis not present

## 2018-02-25 DIAGNOSIS — I10 Essential (primary) hypertension: Secondary | ICD-10-CM

## 2018-02-25 MED ORDER — PREGABALIN 75 MG PO CAPS: 75.0000 mg | ORAL_CAPSULE | Freq: Every day | ORAL | 1 refills | Status: DC

## 2018-02-25 NOTE — Progress Notes (Signed)
Subjective:  Patient ID: Katie [name], female    DOB: February 20, 1946  Age: 72 y.o. MRN: [id number]  CC: Hypertension   HPI Katie [name] presents for f/up - She complains of uncomfortable sensations in her right foot that keep her awake at night.  She has mild discomfort with numbness on the plantar side of her right foot.  She also says that during the night she has an alternating sensation of the foot feeling hot and cold.  He tells me the foot feels better if she hangs at outside of the sheets while she sleeping.  She has no symptoms during the day and denies any lower extremity pain with activity such as claudication.  She had a normal NCS/EMG done about a year and a half ago.  Outpatient Medications Prior to Visit  Medication Sig Dispense Refill  . ACCU-CHEK AVIVA PLUS test strip     . ALPRAZolam (XANAX) 0.5 MG tablet TAKE 1 TABLET BY MOUTH 3  TIMES DAILY AS NEEDED FOR  ANXIETY OR SLEEP 270 tablet 0  . Blood Glucose Calibration (ACCU-CHEK GUIDE CONTROL) LIQD     . Blood Glucose Monitoring Suppl (ACCU-CHEK AVIVA PLUS) W/DEVICE KIT     . cyanocobalamin (,VITAMIN B-12,) 1000 MCG/ML injection 1000 mcg subq once a month 30 mL 1  . Dulaglutide (TRULICITY) 1.5 TW/4.4QK SOPN Inject into the skin.    Marland Kitchen ergocalciferol (VITAMIN D2) 50000 UNITS capsule Take 50,000 Units by mouth once a week.      . furosemide (LASIX) 20 MG tablet TAKE 1 TABLET BY MOUTH ONCE DAILY 90 tablet 1  . [employer] (SIMPLE DIAGNOSTICS LANCING DEV) MISC     . levothyroxine (SYNTHROID, LEVOTHROID) 88 MCG tablet Take 88 mcg by mouth daily.      Marland Kitchen linaclotide (LINZESS) 290 MCG CAPS capsule Take 1 capsule (290 mcg total) by mouth daily before breakfast. 90 capsule 1  . lisinopril (PRINIVIL,ZESTRIL) 10 MG tablet TAKE 1 TABLET BY MOUTH ONCE DAILY 90 tablet 1  . vitamin E 100 UNIT capsule Take 400 Units by mouth daily.     No facility-administered medications prior to visit.     ROS Review of Systems  Constitutional: Negative for  chills, diaphoresis, fatigue and fever.  HENT: Negative.   Eyes: Negative.   Respiratory: Negative for cough, chest tightness, shortness of breath and wheezing.   Cardiovascular: Negative for chest pain, palpitations and leg swelling.  Gastrointestinal: Negative for abdominal pain, constipation, diarrhea, nausea and vomiting.  Genitourinary: Negative.  Negative for difficulty urinating.  Musculoskeletal: Negative.  Negative for arthralgias and myalgias.  Skin: Negative.   Neurological: Positive for numbness. Negative for dizziness, weakness and light-headedness.  Hematological: Negative for adenopathy. Does not bruise/bleed easily.  Psychiatric/Behavioral: Negative.     Objective:  BP 140/80 (BP Location: Left Arm, Patient Position: Sitting, Cuff Size: Normal)   Pulse 63   Temp 98.3 F (36.8 C) (Oral)   Resp 16   Ht 5' 4.25" (1.632 m)   Wt 178 lb 4 oz (80.9 kg)   SpO2 99%   BMI 30.36 kg/m   BP Readings from Last 3 Encounters:  02/25/18 140/80  02/18/18 120/80  01/24/18 124/80    Wt Readings from Last 3 Encounters:  02/25/18 178 lb 4 oz (80.9 kg)  02/18/18 176 lb 2 oz (79.9 kg)  01/24/18 175 lb (79.4 kg)    Physical Exam  Constitutional: She is oriented to person, place, and time. No distress.  HENT:  Mouth/Throat: Oropharynx is clear  and moist. No oropharyngeal exudate.  Eyes: Conjunctivae are normal. No scleral icterus.  Neck: Normal range of motion. Neck supple. No JVD present. No thyromegaly present.  Cardiovascular: Normal rate, regular rhythm and normal heart sounds. Exam reveals no gallop.  No murmur heard. Pulmonary/Chest: Effort normal and breath sounds normal. No respiratory distress. She has no wheezes. She has no rhonchi. She has no rales.  Abdominal: Soft. Bowel sounds are normal. She exhibits no mass. There is no hepatosplenomegaly. There is no tenderness.  Musculoskeletal: Normal range of motion. She exhibits no edema, tenderness or deformity.    Lymphadenopathy:    She has no cervical adenopathy.  Neurological: She is alert and oriented to person, place, and time. She displays normal reflexes.  Reflex Scores:      Tricep reflexes are 1+ on the right side and 1+ on the left side.      Bicep reflexes are 1+ on the right side and 1+ on the left side.      Brachioradialis reflexes are 1+ on the right side and 1+ on the left side.      Patellar reflexes are 2+ on the right side and 2+ on the left side.      Achilles reflexes are 1+ on the right side and 1+ on the left side. Neg SLR in BLE  Skin: Skin is warm and dry. No rash noted. She is not diaphoretic. No erythema. No pallor.  Vitals reviewed.   Lab Results  Component Value Date   WBC 4.6 12/13/2015   HGB 13.3 12/13/2015   HCT 38.9 12/13/2015   PLT 218.0 12/13/2015   GLUCOSE 100 (H) 12/13/2015   CHOL 125 01/15/2018   TRIG 72 01/15/2018   HDL 59 01/15/2018   LDLDIRECT 105.9 07/04/2007   LDLCALC 30 01/15/2018   ALT 12 01/15/2018   AST 22 01/15/2018   NA 141 01/15/2018   K 4.7 01/15/2018   CL 104 12/13/2015   CREATININE 1.3 (A) 01/15/2018   BUN 12 01/15/2018   CO2 33 (H) 12/13/2015   TSH 1.36 01/15/2018   HGBA1C 5.2 01/15/2018   MICROALBUR 0.0 05/18/2017    US Abdomen Complete  Result Date: 02/06/2018 CLINICAL DATA:  Right upper quadrant pain for several months EXAM: ABDOMEN ULTRASOUND COMPLETE COMPARISON:  None. FINDINGS: Gallbladder: Gallbladder is well distended with multiple gallstones. Mild wall thickening is noted to the upper limits of normal at 3 mm. No pericholecystic fluid is noted. Negative sonographic Percell Miller sign is elicited. Common bile duct: Diameter: 6.5 mm. Within normal limits for the patient's age. Liver: Slight increased echogenicity is noted which may be related to fatty infiltration. No focal mass is noted. Portal vein is patent on color Doppler imaging with normal direction of blood flow towards the liver. IVC: No abnormality visualized. Pancreas:  Visualized portion unremarkable. Spleen: Size and appearance within normal limits. Right Kidney: Length: 8.7 cm. Echogenicity within normal limits. No mass or hydronephrosis visualized. Left Kidney: Length: 8.6 cm. Echogenicity within normal limits. No mass or hydronephrosis visualized. Abdominal aorta: No aneurysm visualized. Other findings: None. IMPRESSION: Cholelithiasis with upper limits normal wall thickening. Fatty infiltration of the liver. Electronically Signed   By: Inez Catalina M.D.   On: 02/06/2018 15:22    Assessment & Plan:   Imo was seen today for hypertension.  Diagnoses and all orders for this visit:  Diabetic mononeuropathy associated with diabetes mellitus due to underlying condition ([place])- I have asked her to start treating this with pregabalin.  She  will start by taking 75 mg at bedtime.  Will increase or lower the dose based on her response. -     pregabalin (LYRICA) 75 MG capsule; Take 1 capsule (75 mg total) by mouth at bedtime.  Essential hypertension- Her blood pressure is adequately well controlled.   I am having Derl Barrow start on pregabalin. I am also having her maintain her levothyroxine, ergocalciferol, vitamin E, ACCU-CHEK AVIVA PLUS, ACCU-CHEK AVIVA PLUS, SIMPLE DIAGNOSTICS LANCING DEV, ACCU-CHEK GUIDE CONTROL, cyanocobalamin, furosemide, ALPRAZolam, lisinopril, linaclotide, and Dulaglutide.  Meds ordered this encounter  Medications  . pregabalin (LYRICA) 75 MG capsule    Sig: Take 1 capsule (75 mg total) by mouth at bedtime.    Dispense:  90 capsule    Refill:  1     Follow-up: Return in about 3 months (around 05/28/2018).  Scarlette Calico, MD

## 2018-02-25 NOTE — Patient Instructions (Signed)
Diabetic Neuropathy Diabetic neuropathy is a nerve disease or nerve damage that is caused by diabetes mellitus. About half of all people with diabetes mellitus have some form of nerve damage. Nerve damage is more common in those who have had diabetes mellitus for many years and who generally have not had good control of their blood sugar (glucose) level. Diabetic neuropathy is a common complication of diabetes mellitus. There are three common types of diabetic neuropathy and a fourth type that is less common and less understood:  Peripheral neuropathy-This is the most common type of diabetic neuropathy. It causes damage to the nerves of the feet and legs first and then eventually the hands and arms. The damage affects the ability to sense touch.  Autonomic neuropathy-This type causes damage to the autonomic nervous system, which controls the following functions: ? Heartbeat. ? Body temperature. ? Blood pressure. ? Urination. ? Digestion. ? Sweating. ? Sexual function.  Focal neuropathy-Focal neuropathy can be painful and unpredictable and occurs most often in older adults with diabetes mellitus. It involves a specific nerve or one area and often comes on suddenly. It usually does not cause long-term problems.  Radiculoplexus neuropathy- Sometimes called lumbosacral radiculoplexus neuropathy, radiculoplexus neuropathy affects the nerves of the thighs, hips, buttocks, or legs. It is more common in people with type 2 diabetes mellitus and in older men. It is characterized by debilitating pain, weakness, and atrophy, usually in the thigh muscles.  What are the causes? The cause of peripheral, autonomic, and focal neuropathies is diabetes mellitus that is uncontrolled and high glucose levels. The cause of radiculoplexus neuropathy is unknown. However, it is thought to be caused by inflammation related to uncontrolled glucose levels. What are the signs or symptoms? Peripheral Neuropathy Peripheral  neuropathy develops slowly over time. When the nerves of the feet and legs no longer work there may be:  Burning, stabbing, or aching pain in the legs or feet.  Inability to feel pressure or pain in your feet. This can lead to: ? Thick calluses over pressure areas. ? Pressure sores. ? Ulcers.  Foot deformities.  Reduced ability to feel temperature changes.  Muscle weakness.  Autonomic Neuropathy The symptoms of autonomic neuropathy vary depending on which nerves are affected. Symptoms may include:  Problems with digestion, such as: ? Feeling sick to your stomach (nausea). ? Vomiting. ? Bloating. ? Constipation. ? Diarrhea. ? Abdominal pain.  Difficulty with urination. This occurs if you lose your ability to sense when your bladder is full. Problems include: ? Urine leakage (incontinence). ? Inability to empty your bladder completely (retention).  Rapid or irregular heartbeat (palpitations).  Blood pressure drops when you stand up (orthostatic hypotension). When you stand up you may feel: ? Dizzy. ? Weak. ? Faint.  In men, inability to attain and maintain an erection.  In women, vaginal dryness and problems with decreased sexual desire and arousal.  Problems with body temperature regulation.  Increased or decreased sweating.  Focal Neuropathy  Abnormal eye movements or abnormal alignment of both eyes.  Weakness in the wrist.  Foot drop. This results in an inability to lift the foot properly and abnormal walking or foot movement.  Paralysis on one side of your face (Bell palsy).  Chest or abdominal pain. Radiculoplexus Neuropathy  Sudden, severe pain in your hip, thigh, or buttocks.  Weakness and wasting of thigh muscles.  Difficulty rising from a seated position.  Abdominal swelling.  Unexplained weight loss (usually more than 10 lb [4.5 kg]). How is   this diagnosed? Peripheral Neuropathy Your senses may be tested. Sensory function testing can be  done with:  A light touch using a monofilament.  A vibration with tuning fork.  A sharp sensation with a pin prick.  Other tests that can help diagnose neuropathy are:  Nerve conduction velocity. This test checks the transmission of an electrical current through a nerve.  Electromyography. This shows how muscles respond to electrical signals transmitted by nearby nerves.  Quantitative sensory testing. This is used to assess how your nerves respond to vibrations and changes in temperature.  Autonomic Neuropathy Diagnosis is often based on reported symptoms. Tell your health care provider if you experience:  Dizziness.  Constipation.  Diarrhea.  Inappropriate urination or inability to urinate.  Inability to get or maintain an erection.  Tests that may be done include:  Electrocardiography or Holter monitor. These are tests that can help show problems with the heart rate or heart rhythm.  An X-ray exam may be done.  Focal Neuropathy Diagnosis is made based on your symptoms and what your health care provider finds during your exam. Other tests may be done. They may include:  Nerve conduction velocities. This checks the transmission of electrical current through a nerve.  Electromyography. This shows how muscles respond to electrical signals transmitted by nearby nerves.  Quantitative sensory testing. This test is used to assess how your nerves respond to vibration and changes in temperature.  Radiculoplexus Neuropathy  Often the first thing is to eliminate any other issue or problems that might be the cause, as there is no standard test for diagnosis.  X-ray exam of your spine and lumbar region.  Spinal tap to rule out cancer.  MRI to rule out other lesions. How is this treated? Once nerve damage occurs, it cannot be reversed. The goal of treatment is to keep the disease or nerve damage from getting worse and affecting more nerve fibers. Controlling your blood  glucose level is the key. Most people with radiculoplexus neuropathy see at least a partial improvement over time. You will need to keep your blood glucose and HbA1c levels in the target range determined by your health care provider. Things that help control blood glucose levels include:  Blood glucose monitoring.  Meal planning.  Physical activity.  Diabetes medicine.  Over time, maintaining lower blood glucose levels helps lessen symptoms. Sometimes, prescription pain medicine is needed. Follow these instructions at home:  Do not smoke.  Keep your blood glucose level in the range that you and your health care provider have determined acceptable for you.  Keep your blood pressure level in the range that you and your health care provider have determined acceptable for you.  Eat a well-balanced diet.  Be physically active every day. Include strength training and balance exercises.  Protect your feet. ? Check your feet every day for sores, cuts, blisters, or signs of infection. ? Wear padded socks and supportive shoes. Use orthotic inserts, if necessary. ? Regularly check the insides of your shoes for worn spots. Make sure there are no rocks or other items inside your shoes before you put them on. Contact a health care provider if:  You have burning, stabbing, or aching pain in the legs or feet.  You are unable to feel pressure or pain in your feet.  You develop problems with digestion such as: ? Nausea. ? Vomiting. ? Bloating. ? Constipation. ? Diarrhea. ? Abdominal pain.  You have difficulty with urination, such as: ? Incontinence. ? Retention.    You have palpitations.  You develop orthostatic hypotension. When you stand up you may feel: ? Dizzy. ? Weak. ? Faint.  You cannot attain and maintain an erection (in men).  You have vaginal dryness and problems with decreased sexual desire and arousal (in women).  You have severe pain in your thighs, legs, or  buttocks.  You have unexplained weight loss. This information is not intended to replace advice given to you by your health care provider. Make sure you discuss any questions you have with your health care provider. Document Released: 07/10/2001 Document Revised: 10/07/2015 Document Reviewed: 10/10/2012 Elsevier Interactive Patient Education  2017 [employer]  

## 2018-03-07 ENCOUNTER — Ambulatory Visit (AMBULATORY_SURGERY_CENTER): Payer: Medicare Other | Admitting: Gastroenterology

## 2018-03-07 ENCOUNTER — Encounter: Payer: Self-pay | Admitting: Gastroenterology

## 2018-03-07 VITALS — BP 130/67 | HR 79 | Temp 98.9°F | Resp 40 | Ht 64.25 in | Wt 176.0 lb

## 2018-03-07 DIAGNOSIS — K2970 Gastritis, unspecified, without bleeding: Secondary | ICD-10-CM | POA: Diagnosis not present

## 2018-03-07 DIAGNOSIS — K3189 Other diseases of stomach and duodenum: Secondary | ICD-10-CM

## 2018-03-07 DIAGNOSIS — K2270 Barrett's esophagus without dysplasia: Secondary | ICD-10-CM

## 2018-03-07 DIAGNOSIS — D3A092 Benign carcinoid tumor of the stomach: Secondary | ICD-10-CM

## 2018-03-07 DIAGNOSIS — R6881 Early satiety: Secondary | ICD-10-CM

## 2018-03-07 DIAGNOSIS — R1013 Epigastric pain: Secondary | ICD-10-CM

## 2018-03-07 DIAGNOSIS — K317 Polyp of stomach and duodenum: Secondary | ICD-10-CM

## 2018-03-07 DIAGNOSIS — R634 Abnormal weight loss: Secondary | ICD-10-CM

## 2018-03-07 DIAGNOSIS — K2950 Unspecified chronic gastritis without bleeding: Secondary | ICD-10-CM | POA: Diagnosis not present

## 2018-03-07 MED ORDER — SODIUM CHLORIDE 0.9 % IV SOLN: 500.0000 mL | Freq: Once | INTRAVENOUS | Status: DC

## 2018-03-07 MED ORDER — PANTOPRAZOLE SODIUM 40 MG PO TBEC: 40.0000 mg | DELAYED_RELEASE_TABLET | Freq: Every day | ORAL | 0 refills | Status: DC

## 2018-03-07 NOTE — Progress Notes (Signed)
Called to room to assist during endoscopic procedure.  Patient ID and intended procedure confirmed with present staff. Received instructions for my participation in the procedure from the performing physician.  

## 2018-03-07 NOTE — Patient Instructions (Signed)
YOU HAD AN ENDOSCOPIC PROCEDURE TODAY AT [facility]:   Refer to the procedure report that was given to you for any specific questions about what was found during the examination.  If the procedure report does not answer your questions, please call your gastroenterologist to clarify.  If you requested that your care partner not be given the details of your procedure findings, then the procedure report has been included in a sealed envelope for you to review at your convenience later.  YOU SHOULD EXPECT: Some feelings of bloating in the abdomen. Passage of more gas than usual.  Walking can help get rid of the air that was put into your GI tract during the procedure and reduce the bloating.   Please Note:  You might notice some irritation and congestion in your nose or some drainage.  This is from the oxygen used during your procedure.  There is no need for concern and it should clear up in a day or so.  SYMPTOMS TO REPORT IMMEDIATELY:    Following upper endoscopy (EGD)  Vomiting of blood or coffee ground material  New chest pain or pain under the shoulder blades  Painful or persistently difficult swallowing  New shortness of breath  Fever of 100F or higher  Black, tarry-looking stools  For urgent or emergent issues, a gastroenterologist can be reached at any hour by calling [phone].   DIET:  We do recommend a small meal at first, but then you may proceed to your regular diet.  Drink plenty of fluids but you should avoid alcoholic beverages for 24 hours.  ACTIVITY:  You should plan to take it easy for the rest of today and you should NOT DRIVE or use heavy machinery until tomorrow (because of the sedation medicines used during the test).    FOLLOW UP: Our staff will call the number listed on your records the next business day following your procedure to check on you and address any questions or concerns that you may have regarding the information given to you  following your procedure. If we do not reach you, we will leave a message.  However, if you are feeling well and you are not experiencing any problems, there is no need to return our call.  We will assume that you have returned to your regular daily activities without incident.  If any biopsies were taken you will be contacted by phone or by letter within the next 1-3 weeks.  Please call us at [phone] if you have not heard about the biopsies in 3 weeks.    SIGNATURES/CONFIDENTIALITY: You and/or your care partner have signed paperwork which will be entered into your electronic medical record.  These signatures attest to the fact that that the information above on your After Visit Summary has been reviewed and is understood.  Full responsibility of the confidentiality of this discharge information lies with you and/or your care-partner.  Read all handouts given to you by your recovery room nurse.

## 2018-03-07 NOTE — Progress Notes (Signed)
Report given to PACU, vss 

## 2018-03-07 NOTE — Progress Notes (Signed)
Patient's blood sugar 83, asymptomatic.  Dr. Fuller Plan and Lafe Garin, CRNA are both aware and no further orders.

## 2018-03-07 NOTE — Op Note (Signed)
[facility] Patient Name: Noha Milberger Procedure Date: 03/07/2018 2:46 PM MRN: [id number] Endoscopist: Ladene Artist , MD Age: 72 Referring MD:  Date of Birth: Dec 19, 1945 Gender: Female Account #: [id number] Procedure:                Upper GI endoscopy Indications:              Epigastric abdominal pain, Early satiety, Weight                            loss Medicines:                Monitored Anesthesia Care Procedure:                Pre-Anesthesia Assessment:                           - Prior to the procedure, a History and Physical                            was performed, and patient medications and                            allergies were reviewed. The patient's tolerance of                            previous anesthesia was also reviewed. The risks                            and benefits of the procedure and the sedation                            options and risks were discussed with the patient.                            All questions were answered, and informed consent                            was obtained. Prior Anticoagulants: The patient has                            taken no previous anticoagulant or antiplatelet                            agents. ASA Grade Assessment: III - A patient with                            severe systemic disease. After reviewing the risks                            and benefits, the patient was deemed in                            satisfactory condition to undergo the procedure.  After obtaining informed consent, the endoscope was                            passed under direct vision. Throughout the                            procedure, the patient's blood pressure, pulse, and                            oxygen saturations were monitored continuously. The                            Model GIF-[id number]) scope was introduced                            through the mouth, and advanced to the second  part                            of duodenum. The upper GI endoscopy was                            accomplished without difficulty. The patient                            tolerated the procedure well. Scope In: Scope Out: Findings:                 Localized mild erythema was found in the proximal                            esophagus from 19 - 23 cm. Biopsies were taken with                            a cold forceps for histology. R/O inlet patch.                           The exam of the esophagus was otherwise normal.                           A single 6 mm sessile polyp with no bleeding and no                            stigmata of recent bleeding was found in the                            gastric body. The polyp was removed with a cold                            snare. Resection and retrieval were complete.                           Diffuse atrophic mucosa was found in the gastric  fundus and in the gastric body. Biopsies were taken                            with a cold forceps for histology.                           A single 6 mm non bleeding angiodysplastic lesion                            was found in the gastric body.                           The exam of the stomach was otherwise normal.                           The duodenal bulb and second portion of the                            duodenum were normal. Complications:            No immediate complications. Estimated Blood Loss:     Estimated blood loss was minimal. Impression:               - Erythema in the proximal esophagus. Biopsied.                           - A single gastric polyp. Resected and retrieved.                           - Gastric mucosal atrophy. Biopsied.                           - Single gastric angiodysplasia.                           - Normal duodenal bulb and second portion of the                            duodenum. Recommendation:           - Patient has a contact number  available for                            emergencies. The signs and symptoms of potential                            delayed complications were discussed with the                            patient. Return to normal activities tomorrow.                            Written discharge instructions were provided to the                            patient.                           -  Resume previous diet.                           - Continue present medications.                           - Await pathology results.                           - Protonix (pantoprazole) 40 mg PO daily for 2                            months.                           - Return to GI office in 2-3 weeks with me or if I                            do not have an appointment then with an APP. Ladene Artist, MD 03/07/2018 3:15:41 PM This report has been signed electronically.

## 2018-03-08 ENCOUNTER — Telehealth: Payer: Self-pay

## 2018-03-08 NOTE — Telephone Encounter (Signed)
  Follow up Call-  Call back number 03/07/2018  Post procedure Call Back phone  # [phone]  Permission to leave phone message Yes  Some recent data might be hidden     Patient questions:  Do you have a fever, pain , or abdominal swelling? Yes.   Pain Score  1 * Tickle in throat Advised to use warm saltwater gargle  Have you tolerated food without any problems? Yes.    Have you been able to return to your normal activities? Yes.    Do you have any questions about your discharge instructions: Diet   No. Medications  No. Follow up visit  No.  Do you have questions or concerns about your Care? No.  Actions: * If pain score is 4 or above: No action needed, pain <4.

## 2018-03-12 ENCOUNTER — Other Ambulatory Visit: Payer: Self-pay | Admitting: Internal Medicine

## 2018-03-12 DIAGNOSIS — F411 Generalized anxiety disorder: Secondary | ICD-10-CM

## 2018-03-27 ENCOUNTER — Ambulatory Visit: Payer: Medicare Other | Admitting: Physician Assistant

## 2018-03-27 ENCOUNTER — Other Ambulatory Visit: Payer: Self-pay

## 2018-03-27 ENCOUNTER — Encounter: Payer: Self-pay | Admitting: Physician Assistant

## 2018-03-27 VITALS — BP 130/80 | HR 88 | Ht 64.25 in | Wt 181.6 lb

## 2018-03-27 DIAGNOSIS — K5909 Other constipation: Secondary | ICD-10-CM

## 2018-03-27 DIAGNOSIS — K2940 Chronic atrophic gastritis without bleeding: Secondary | ICD-10-CM

## 2018-03-27 DIAGNOSIS — R1013 Epigastric pain: Secondary | ICD-10-CM

## 2018-03-27 DIAGNOSIS — R140 Abdominal distension (gaseous): Secondary | ICD-10-CM

## 2018-03-27 DIAGNOSIS — D3A8 Other benign neuroendocrine tumors: Secondary | ICD-10-CM

## 2018-03-27 DIAGNOSIS — K2270 Barrett's esophagus without dysplasia: Secondary | ICD-10-CM

## 2018-03-27 NOTE — Patient Instructions (Addendum)
We are supposed to be getting samples of Amitiza 24 mcg today. Oumar Marcott will call you when we get them and you can pick them up here, or you can someone pick them up for you.  They will be at the front desk.  Continue Linzess 290 mcg until you get the samples of Amitiza mcg.   Stat Miralax 17 grams in 8 oz of water every day.  Continue Protonix 40 mg by mouth daily.  Keep  Your appointment with Dr. Lucio Edward on 04-16-2018 at 10:34 am.  Normal BMI (Body Mass Index- based on height and weight) is between 23 and 30. Your BMI today is Body mass index is 30.93 kg/m. Marland Kitchen Please consider follow up  regarding your BMI with your Primary Care Provider.

## 2018-03-28 ENCOUNTER — Ambulatory Visit (INDEPENDENT_AMBULATORY_CARE_PROVIDER_SITE_OTHER): Payer: Medicare Other | Admitting: *Deleted

## 2018-03-28 ENCOUNTER — Encounter: Payer: Self-pay | Admitting: Physician Assistant

## 2018-03-28 VITALS — BP 136/82 | HR 76 | Resp 18 | Ht 64.0 in | Wt 182.0 lb

## 2018-03-28 DIAGNOSIS — Z0000 Encounter for general adult medical examination without abnormal findings: Secondary | ICD-10-CM

## 2018-03-28 NOTE — Progress Notes (Signed)
Subjective:    Patient ID: Katie [name], female    DOB: [birth year], 72 y.o.   MRN: [id number]  HPI [place] is a pleasant 72 year old African-American female known to Dr. Fuller Plan.  She was recently seen in the office by Dr. Fuller Plan on 02/18/2018 with complaints of epigastric pain, early satiety constipation and was found on imaging to have gallstones.  She had been started on a trial of Linzess 290 mcg daily and MiraLAX 17 g in 8 ounces water daily She underwent EGD on 03/06/2018 which showed proximal gastritis, normal esophagus, there is a 6 mm sessile polyp in the gastric body and diffuse atrophic mucosa of the gastric fundus.  Also had a 6 mm AVM in the gastric body .  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   Biopsies of the esophagus were consistent with Barrett's with no dysplasia. She was to be treated with twice daily Protonix for 2 months.  She had also been offered referral to surgery.     She comes in today for follow-up stating that she did see the surgeon, cholecystectomy has not been scheduled, and she is to return there in a month. She says she feels a little bit better since being on the Protonix that she has no complaints of nausea or vomiting no real complaints of abdominal pain but continues to complain of a bloated sensation in the epigastrium and a feeling that she is filling up quickly.  Her weight has been stable actually up 5 pounds from what was documented on 02/18/2018. She remains constipated despite Linzess and says she has bowel movement every 2 days or so.  She has not started MiraLAX.  She has traditionally used mag citrate every couple of weeks as needed to "flush herself out". Last colonoscopy 2015 with a 6 mm sessile polyp in the sigmoid colon which was a tubular adenoma otherwise negative exam with exception of internal hemorrhoids and she is indicated for 5-year follow-up.  Review of Systems;Pertinent positive and negative  review of systems were noted in the above HPI section.  All other review of systems was otherwise negative.  Outpatient Encounter Medications as of 03/27/2018  Medication Sig  . ACCU-CHEK AVIVA PLUS test strip   . ALPRAZolam (XANAX) 0.5 MG tablet TAKE 1 TABLET BY MOUTH 3  TIMES DAILY AS NEEDED FOR  ANXIETY OR SLEEP  . Blood Glucose Calibration (ACCU-CHEK GUIDE CONTROL) LIQD   . Blood Glucose Monitoring Suppl (ACCU-CHEK AVIVA PLUS) W/DEVICE KIT   . cyanocobalamin (,VITAMIN B-12,) 1000 MCG/ML injection 1000 mcg subq once a month  . Dulaglutide (TRULICITY) 1.5 CX/4.4YJ SOPN Inject into the skin.  Marland Kitchen ergocalciferol (VITAMIN D2) 50000 UNITS capsule Take 50,000 Units by mouth once a week.    . furosemide (LASIX) 20 MG tablet TAKE 1 TABLET BY MOUTH ONCE DAILY  . Lancet Devices (SIMPLE DIAGNOSTICS LANCING DEV) MISC   . levothyroxine (SYNTHROID, LEVOTHROID) 88 MCG tablet Take 88 mcg by mouth daily.    Marland Kitchen lisinopril (PRINIVIL,ZESTRIL) 10 MG tablet TAKE 1 TABLET BY MOUTH ONCE DAILY  . pantoprazole (PROTONIX) 40 MG tablet Take 1 tablet (40 mg total) by mouth daily.  . pregabalin (LYRICA) 75 MG capsule Take 1 capsule (75 mg total) by mouth at bedtime.  . vitamin E 100 UNIT capsule Take 400 Units by mouth daily.  . [DISCONTINUED] linaclotide (LINZESS) 290 MCG CAPS capsule Take 1 capsule (290 mcg total) by mouth daily before breakfast. (Patient not taking: Reported  on 03/28/2018)   No facility-administered encounter medications on file as of 03/27/2018.    Allergies  Allergen Reactions  . Metoprolol Succinate     REACTION: ? rash from Toprol ?  Marland Kitchen Penicillins     REACTION: allergic to penicillin  . Sulfamethoxazole-Trimethoprim     Other reaction(s): Other (See Comments) Reaction not listed  . Metformin Nausea Only   Patient Active Problem List   Diagnosis Date Noted  . Calculus of gallbladder with chronic cholecystitis without obstruction 02/06/2018  . Early satiety 01/27/2018  . Chronic renal  disease, stage 3, moderately decreased glomerular filtration rate (GFR) between 30-59 mL/min/1.73 square meter (HCC) 01/24/2018  . Chronic right hip pain 07/31/2017  . Primary osteoarthritis of right hip 07/31/2017  . Obesity, Class I, BMI 30.0-34.9 (see actual BMI) 09/20/2016  . Spinal stenosis, lumbar region with neurogenic claudication 06/28/2016  . Meralgia paresthetica, right 05/30/2016  . Hyperglycemia 11/27/2012  . Vitamin D deficiency 03/05/2008  . Hyperlipidemia with target LDL less than 100 11/16/2007  . Hypothyroidism 04/03/2007  . B12 deficiency 04/03/2007  . GAD (generalized anxiety disorder) 04/03/2007  . PSEUDOTUMOR CEREBRI 04/03/2007  . IRITIS 04/03/2007  . Essential hypertension 04/03/2007  . History of DVT of lower extremity 04/03/2007  . Venous (peripheral) insufficiency 04/03/2007  . REFLUX ESOPHAGITIS 04/03/2007  . Irritable bowel syndrome with constipation 04/03/2007  . DEGENERATIVE DISC DISEASE, CERVICAL SPINE 04/03/2007   Social History   Socioeconomic History  . Marital status: Married    Spouse name: Kamoni Depree  . Number of children: 0  . Years of education: masters  . Highest education level: Not on file  Occupational History  . Occupation: record specialist    Employer: [employer]  Social Needs  . Financial resource strain: Not hard at all  . Food insecurity:    Worry: Never true    Inability: Never true  . Transportation needs:    Medical: No    Non-medical: No  Tobacco Use  . Smoking status: Never Smoker  . Smokeless tobacco: Never Used  Substance and Sexual Activity  . Alcohol use: Yes    Alcohol/week: 0.0 standard drinks    Comment: occasional  . Drug use: No  . Sexual activity: Not Currently  Lifestyle  . Physical activity:    Days per week: 3 days    Minutes per session: 50 min  . Stress: Not at all  Relationships  . Social connections:    Talks on phone: More than three times a week    Gets together: More than three times  a week    Attends religious service: More than 4 times per year    Active member of club or organization: Yes    Attends meetings of clubs or organizations: More than 4 times per year    Relationship status: Married  . Intimate partner violence:    Fear of current or ex partner: No    Emotionally abused: No    Physically abused: No    Forced sexual activity: No  Other Topics Concern  . Not on file  Social History Narrative   Lives with husband in a 2 story home with a basement.  Has no children.     Retired from the police department.     Education: masters (guidance and counseling).     Ms. Lizardi family history includes Deep vein thrombosis in her mother; Diabetes in her mother; Healthy in her sister; Hypertension in her mother; Prostate cancer in her father.  Objective:    Vitals:   03/27/18 0904  BP: 130/80  Pulse: 88    Physical Exam Well-developed older African-American female in no acute distress, pleasant blood pressure 130/80 pulse 88, BMI 30.9.  HEENT ;nontraumatic normocephalic EOMI PERRLA sclera anicteric oral mucosa moist, Cardiovascular; regular rate and rhythm with S1-S2 no murmur rub or gallop, Pulmonary ;clear bilaterally, Abdomen; soft, he is basically nontender there is no palpable mass or hepatosplenomegaly bowel sounds are present, Rectal ;exam not done, Extremities; no clubbing cyanosis or edema skin warm and dry, Neuro psych; alert and oriented, grossly nonfocal mood and affect appropriate       Assessment & Plan:   #107 72 year old African-American female who had presented with epigastric discomfort and early satiety, minimal weight loss constipation.  EGD pertinent for gastritis and atrophic gastritis in the gastric fundus.    She has been on twice daily Protonix and has had some mild improvement in symptoms she now describes primarily as bloating and a sensation of early satiety.  Weight is stable, actually up 5 pounds since last office  visit.  #2 cholelithiasis-question symptomatic.  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  #4 chronic constipation-no real improvement with Linzess #5 history of adenomatous colon polyps colonoscopy 2015 will be due for follow-up 2020  # 6 new finding Barrett's esophagus-no dysplasia ,will need repeat EGD in 3 years  Plan; Continue Protonix 40 mg p.o. twice daily Start trial of Amitiza 24 mcg p.o. twice daily in place of Linzess.  Patient is asked to call back if Amitiza is beneficial and we will send prescription. Patient is encouraged to start MiraLAX 17 g in 8 ounces of water every day.  Regarding the finding of gastric neuroendocrine tumor, I will discuss further with Dr. Fuller Plan, as this was less than a centimeter in size and removed at the time of EGD, she probably just needs follow-up imaging, and repeat EGD in 3 to 4 months.  I discussed the finding of the neuroendocrine tremor with the patient, and she knows we will contact her with further recommendations.  Lewanna Petrak Genia Harold PA-C 03/28/2018   Cc: Janith Lima, MD

## 2018-03-28 NOTE — Patient Instructions (Addendum)
Continue doing brain stimulating activities (puzzles, reading, adult coloring books, staying active) to keep memory sharp.   Continue to eat heart healthy diet (full of fruits, vegetables, whole grains, lean protein, water--limit salt, fat, and sugar intake) and increase physical activity as tolerated.  Katie [name] , Thank you for taking time to come for your Medicare Wellness Visit. I appreciate your ongoing commitment to your health goals. Please review the following plan we discussed and let me know if I can assist you in the future.   These are the goals we discussed: Goals    . Have 3 meals a day     Will try 3 balanced meals per day; or balancing diet Will refer to the [facility] Monday night 6pm; Diabetes Type 2 class;     . Patient Stated     I want to travel and see places I have not seen before.       This is a list of the screening recommended for you and due dates:  Health Maintenance  Topic Date Due  . Hemoglobin A1C  07/16/2018  . Eye exam for diabetics  07/31/2018  . Colon Cancer Screening  09/03/2018  . Complete foot exam   02/26/2019  . Mammogram  04/19/2019  . Tetanus Vaccine  03/15/2025  . Flu Shot  Completed  . DEXA scan (bone density measurement)  Completed  .  Hepatitis C: One time screening is recommended by Center for Disease Control  (CDC) for  adults born from [birth year] through 1966.   Completed  . Pneumonia vaccines  Completed     High-Fiber Diet Fiber, also called dietary fiber, is a type of carbohydrate found in fruits, vegetables, whole grains, and beans. A high-fiber diet can have many health benefits. Your health care provider may recommend a high-fiber diet to help:  Prevent constipation. Fiber can make your bowel movements more regular.  Lower your cholesterol.  Relieve hemorrhoids, uncomplicated diverticulosis, or irritable bowel syndrome.  Prevent overeating as part of a weight-loss plan.  Prevent heart disease, type 2  diabetes, and certain cancers.  What is my plan? The recommended daily intake of fiber includes:  38 grams for men under age 62.  20 grams for men over age 61.  72 grams for women under age 42.  56 grams for women over age 40.  You can get the recommended daily intake of dietary fiber by eating a variety of fruits, vegetables, grains, and beans. Your health care provider may also recommend a fiber supplement if it is not possible to get enough fiber through your diet. What do I need to know about a high-fiber diet?  Fiber supplements have not been widely studied for their effectiveness, so it is better to get fiber through food sources.  Always check the fiber content on thenutrition facts label of any prepackaged food. Look for foods that contain at least 5 grams of fiber per serving.  Ask your dietitian if you have questions about specific foods that are related to your condition, especially if those foods are not listed in the following section.  Increase your daily fiber consumption gradually. Increasing your intake of dietary fiber too quickly may cause bloating, cramping, or gas.  Drink plenty of water. Water helps you to digest fiber. What foods can I eat? Grains Whole-grain breads. Multigrain cereal. Oats and oatmeal. Brown rice. Barley. Bulgur wheat. Clever. Bran muffins. Popcorn. Rye wafer crackers. Vegetables Sweet potatoes. Spinach. Kale. Artichokes. Cabbage. Broccoli. Green peas.  Carrots. Squash. Fruits Berries. Pears. Apples. Oranges. Avocados. Prunes and raisins. Dried figs. Meats and Other Protein Sources Navy, kidney, pinto, and soy beans. Split peas. Lentils. Nuts and seeds. Dairy Fiber-fortified yogurt. Beverages Fiber-fortified soy milk. Fiber-fortified orange juice. Other Fiber bars. The items listed above may not be a complete list of recommended foods or beverages. Contact your dietitian for more options. What foods are not recommended? Grains White  bread. Pasta made with refined flour. White rice. Vegetables Fried potatoes. Canned vegetables. Well-cooked vegetables. Fruits Fruit juice. Cooked, strained fruit. Meats and Other Protein Sources Fatty cuts of meat. Fried Sales executive or fried fish. Dairy Milk. Yogurt. Cream cheese. Sour cream. Beverages Soft drinks. Other Cakes and pastries. Butter and oils. The items listed above may not be a complete list of foods and beverages to avoid. Contact your dietitian for more information. What are some tips for including high-fiber foods in my diet?  Eat a wide variety of high-fiber foods.  Make sure that half of all grains consumed each day are whole grains.  Replace breads and cereals made from refined flour or white flour with whole-grain breads and cereals.  Replace white rice with brown rice, bulgur wheat, or millet.  Start the day with a breakfast that is high in fiber, such as a cereal that contains at least 5 grams of fiber per serving.  Use beans in place of meat in soups, salads, or pasta.  Eat high-fiber snacks, such as berries, raw vegetables, nuts, or popcorn. This information is not intended to replace advice given to you by your health care provider. Make sure you discuss any questions you have with your health care provider. Document Released: 05/01/2005 Document Revised: 10/07/2015 Document Reviewed: 10/14/2013 Elsevier Interactive Patient Education  2018 [employer].  Cholelithiasis Cholelithiasis is also called "gallstones." It is a kind of gallbladder disease. The gallbladder is an organ that stores a liquid (bile) that helps you digest fat. Gallstones may not cause symptoms (may be silent gallstones) until they cause a blockage, and then they can cause pain (gallbladder attack). Follow these instructions at home:  Take over-the-counter and prescription medicines only as told by your doctor.  Stay at a healthy weight.  Eat healthy foods. This includes: ? Eating  fewer fatty foods, like fried foods. ? Eating fewer refined carbs (refined carbohydrates). Refined carbs are breads and grains that are highly processed, like white bread and white rice. Instead, choose whole grains like whole-wheat bread and brown rice. ? Eating more fiber. Almonds, fresh fruit, and beans are healthy sources of fiber.  Keep all follow-up visits as told by your doctor. This is important. Contact a doctor if:  You have sudden pain in the upper right side of your belly (abdomen). Pain might spread to your right shoulder or your chest. This may be a sign of a gallbladder attack.  You feel sick to your stomach (are nauseous).  You throw up (vomit).  You have been diagnosed with gallstones that have no symptoms and you get: ? Belly pain. ? Discomfort, burning, or fullness in the upper part of your belly (indigestion). Get help right away if:  You have sudden pain in the upper right side of your belly, and it lasts for more than 2 hours.  You have belly pain that lasts for more than 5 hours.  You have a fever or chills.  You keep feeling sick to your stomach or you keep throwing up.  Your skin or the whites of your eyes  turn yellow (jaundice).  You have dark-colored pee (urine).  You have light-colored poop (stool). Summary  Cholelithiasis is also called "gallstones."  The gallbladder is an organ that stores a liquid (bile) that helps you digest fat.  Silent gallstones are gallstones that do not cause symptoms.  A gallbladder attack may cause sudden pain in the upper right side of your belly. Pain might spread to your right shoulder or your chest. If this happens, contact your doctor.  If you have sudden pain in the upper right side of your belly that lasts for more than 2 hours, get help right away. This information is not intended to replace advice given to you by your health care provider. Make sure you discuss any questions you have with your health care  provider. Document Released: 10/18/2007 Document Revised: 01/16/2016 Document Reviewed: 01/16/2016 Elsevier Interactive Patient Education  2017 [employer].  This support group for Type 2 diabetics and their family members addresses a wide range of topics related to diabetes. Registration Details Call [phone] for more information. Fees & Payment This program is free. Marland Kitchen Description . Schedule & Location . Related Events This is a free support group for individuals with Type 2 Diabetes and their family members. The group meets from 6 to 7 p.m. on the second Monday of each month. Check-in is at 5:45 p.m. The meetings are held at [place] [facility] [place] located at [facility] at [employer], [place], fourth floor conference room.

## 2018-03-28 NOTE — Progress Notes (Addendum)
Subjective:   Katie [name] is a 72 y.o. female who presents for Medicare Annual (Subsequent) preventive examination.  Review of Systems:  No ROS.  Medicare Wellness Visit. Additional risk factors are reflected in the social history.  Cardiac Risk Factors include: advanced age (>46mn, >>55women);diabetes mellitus;dyslipidemia;hypertension Sleep patterns: gets up 1 times nightly to void and sleeps 7 hours nightly.    Home Safety/Smoke Alarms: Feels safe in home. Smoke alarms in place.  Living environment; residence and Firearm Safety: 1-story house/ trailer, no firearms Lives with husband, no needs for DME, good support system Seat Belt Safety/Bike Helmet: Wears seat belt.     Objective:     Vitals: BP 136/82   Pulse 76   Resp 18   Ht 5' 4"  (1.626 m)   Wt 182 lb (82.6 kg)   SpO2 99%   BMI 31.24 kg/m   Body mass index is 31.24 kg/m.  Advanced Directives 03/28/2018 06/21/2017 03/26/2017 03/14/2016 02/16/2016 08/10/2014  Does Patient Have a Medical Advance Directive? No Yes No No Yes Yes  Type of Advance Directive - Living will - - - Living will  Does patient want to make changes to medical advance directive? - - - - - No - Patient declined  Copy of [place]? - - - - No - copy requested -  Would patient like information on creating a medical advance directive? Yes (ED - Information included in AVS) - Yes (ED - Information included in AVS) Yes - Educational materials given - -    Tobacco Social History   Tobacco Use  Smoking Status Never Smoker  Smokeless Tobacco Never Used     Counseling given: Not Answered  Past Medical History:  Diagnosis Date  . Acute venous embolism and thrombosis of unspecified deep vessels of lower extremity 2001   RLE DVT and PE, coumadin x 132monow ASSA  . Anxiety state, unspecified   . Chronic idiopathic constipation 11/02/2016  . Degeneration of cervical intervertebral disc   . Gallstones   . History of DVT (deep  vein thrombosis)   . Iron deficiency anemia secondary to blood loss (chronic)   . Irritable bowel syndrome   . Lumbago   . Other B-complex deficiencies   . Personal history of colonic polyps   . Pure hypercholesterolemia   . Reflux esophagitis   . Tubular adenoma of colon 09/2002  . Type II or unspecified type diabetes mellitus without mention of complication, not stated as uncontrolled   . Unspecified essential hypertension   . Unspecified hypothyroidism   . Unspecified iridocyclitis   . Unspecified venous (peripheral) insufficiency   . Unspecified vitamin D deficiency    Past Surgical History:  Procedure Laterality Date  . CARPAL TUNNEL RELEASE    . FEMORAL HERNIA REPAIR    . OOPHORECTOMY    . VESICOVAGINAL FISTULA CLOSURE W/ TAH     Family History  Problem Relation Age of Onset  . Prostate cancer Father   . Diabetes Mother        retinopathy/blind  . Deep vein thrombosis Mother   . Hypertension Mother   . Healthy Sister    Social History   Socioeconomic History  . Marital status: Married    Spouse name: RiArlinda [place]. Number of children: 0  . Years of education: masters  . Highest education level: Not on file  Occupational History  . Occupation: record specialist    Employer: [employer]  Social Needs  .  Financial resource strain: Not hard at all  . Food insecurity:    Worry: Never true    Inability: Never true  . Transportation needs:    Medical: No    Non-medical: No  Tobacco Use  . Smoking status: Never Smoker  . Smokeless tobacco: Never Used  Substance and Sexual Activity  . Alcohol use: Yes    Alcohol/week: 0.0 standard drinks    Comment: occasional  . Drug use: No  . Sexual activity: Not Currently  Lifestyle  . Physical activity:    Days per week: 3 days    Minutes per session: 50 min  . Stress: Not at all  Relationships  . Social connections:    Talks on phone: More than three times a week    Gets together: More than three times a week     Attends religious service: More than 4 times per year    Active member of club or organization: Yes    Attends meetings of clubs or organizations: More than 4 times per year    Relationship status: Married  Other Topics Concern  . Not on file  Social History Narrative   Lives with husband in a 2 story home with a basement.  Has no children.     Retired from the police department.     Education: masters (guidance and counseling).     Outpatient Encounter Medications as of 03/28/2018  Medication Sig  . ACCU-CHEK AVIVA PLUS test strip   . ALPRAZolam (XANAX) 0.5 MG tablet TAKE 1 TABLET BY MOUTH 3  TIMES DAILY AS NEEDED FOR  ANXIETY OR SLEEP  . Blood Glucose Calibration (ACCU-CHEK GUIDE CONTROL) LIQD   . Blood Glucose Monitoring Suppl (ACCU-CHEK AVIVA PLUS) W/DEVICE KIT   . cyanocobalamin (,VITAMIN B-12,) 1000 MCG/ML injection 1000 mcg subq once a month  . Dulaglutide (TRULICITY) 1.5 [place]/7.0WC SOPN Inject into the skin.  Marland Kitchen ergocalciferol (VITAMIN D2) 50000 UNITS capsule Take 50,000 Units by mouth once a week.    . furosemide (LASIX) 20 MG tablet TAKE 1 TABLET BY MOUTH ONCE DAILY  . Lancet Devices (SIMPLE DIAGNOSTICS LANCING DEV) MISC   . levothyroxine (SYNTHROID, LEVOTHROID) 88 MCG tablet Take 88 mcg by mouth daily.    Marland Kitchen linaclotide (LINZESS) 290 MCG CAPS capsule Take 290 mcg by mouth daily before breakfast.  . lisinopril (PRINIVIL,ZESTRIL) 10 MG tablet TAKE 1 TABLET BY MOUTH ONCE DAILY  . pantoprazole (PROTONIX) 40 MG tablet Take 1 tablet (40 mg total) by mouth daily.  . pregabalin (LYRICA) 75 MG capsule Take 1 capsule (75 mg total) by mouth at bedtime.  . vitamin E 100 UNIT capsule Take 400 Units by mouth daily.  . [DISCONTINUED] linaclotide (LINZESS) 290 MCG CAPS capsule Take 1 capsule (290 mcg total) by mouth daily before breakfast. (Patient not taking: Reported on 03/28/2018)   No facility-administered encounter medications on file as of 03/28/2018.     Activities of Daily  Living In your present state of health, do you have any difficulty performing the following activities: 03/28/2018  Hearing? N  Vision? N  Difficulty concentrating or making decisions? N  Walking or climbing stairs? N  Dressing or bathing? N  Doing errands, shopping? N  Preparing Food and eating ? N  Using the Toilet? N  In the past six months, have you accidently leaked urine? N  Do you have problems with loss of bowel control? N  Managing your Medications? N  Managing your Finances? N  Housekeeping or managing your  Housekeeping? N  Some recent data might be hidden    Patient Care Team: Janith Lima, MD as PCP - General (Internal Medicine) [place], Legrand Como, MD (Endocrinology) Ladene Artist, MD (Gastroenterology) Rosemary Holms, [place] (Podiatry) Rutherford Guys, MD (Ophthalmology) Delila Pereyra, MD (Gynecology)    Assessment:   This is a routine wellness examination for [place]. Physical assessment deferred to PCP.   Exercise Activities and Dietary recommendations Current Exercise Habits: Structured exercise class, Type of exercise: walking;strength training/weights, Time (Minutes): 50, Frequency (Times/Week): 3, Weekly Exercise (Minutes/Week): 150, Intensity: Mild, Exercise limited by: orthopedic condition(s)  Diet (meal preparation, eat out, water intake, caffeinated beverages, dairy products, fruits and vegetables): in general, a "healthy" diet  . Reports poor appetite.   Discussed supplementing with glucerna, samples and coupons provided. Encouraged patient to increase daily water and healthy fluid intake. Reviewed heart healthy and diabetic diet Resources provided for [facility] diabetes class.  Goals    . Have 3 meals a day     Will try 3 balanced meals per day; or balancing diet Will refer to the diabetes and nutrition center Monday night 6pm; Diabetes Type 2 class;     . Patient Stated     I want to travel and see places I have not seen before.       Fall  Risk Fall Risk  03/28/2018 03/26/2017 12/18/2016 03/14/2016 02/16/2016  Falls in the past year? 0 No No No No    Depression Screen PHQ 2/9 Scores 03/28/2018 03/26/2017 03/14/2016 02/16/2016  PHQ - 2 Score 0 1 0 0  PHQ- 9 Score 2 2 - -     Cognitive Function MMSE - Mini Mental State Exam 03/26/2017 02/16/2016  Not completed: - (No Data)  Orientation to time 5 -  Orientation to Place 5 -  Registration 3 -  Attention/ Calculation 4 -  Recall 2 -  Language- name 2 objects 2 -  Language- repeat 1 -  Language- follow 3 step command 3 -  Language- read & follow direction 1 -  Write a sentence 1 -  Copy design 1 -  Total score 28 -       Ad8 score reviewed for issues:  Issues making decisions: no  Less interest in hobbies / activities: no  Repeats questions, stories (family complaining): no  Trouble using ordinary gadgets (microwave, computer, phone):no  Forgets the month or year: no  Mismanaging finances: no  Remembering appts: no  Daily problems with thinking and/or memory: no Ad8 score is= 0  Immunization History  Administered Date(s) Administered  . Influenza Split 02/28/2012, 02/15/2013, 01/27/2014  . Influenza Whole 02/13/2008, 04/07/2009  . Influenza, High Dose Seasonal PF 01/29/2018  . Influenza-Unspecified 02/09/2015, 02/04/2016, 03/01/2017  . Pneumococcal Conjugate-13 08/11/2014  . Pneumococcal Polysaccharide-23 05/31/2011, 06/18/2017  . Tdap 03/16/2015  . Zoster Recombinat (Shingrix) 05/09/2017   Screening Tests Health Maintenance  Topic Date Due  . HEMOGLOBIN A1C  07/16/2018  . OPHTHALMOLOGY EXAM  07/31/2018  . COLONOSCOPY  09/03/2018  . FOOT EXAM  02/26/2019  . MAMMOGRAM  04/19/2019  . TETANUS/TDAP  03/15/2025  . INFLUENZA VACCINE  Completed  . DEXA SCAN  Completed  . Hepatitis C Screening  Completed  . PNA vac Low Risk Adult  Completed      Plan:   Continue doing brain stimulating activities (puzzles, reading, adult coloring books, staying  active) to keep memory sharp.   Continue to eat heart healthy diet (full of fruits, vegetables, whole grains, lean protein, water--limit  salt, fat, and sugar intake) and increase physical activity as tolerated.   I have personally reviewed and noted the following in the patient's chart:   . Medical and social history . Use of alcohol, tobacco or illicit drugs  . Current medications and supplements . Functional ability and status . Nutritional status . Physical activity . Advanced directives . List of other physicians . Vitals . Screenings to include cognitive, depression, and falls . Referrals and appointments  In addition, I have reviewed and discussed with patient certain preventive protocols, quality metrics, and best practice recommendations. A written personalized care plan for preventive services as well as general preventive health recommendations were provided to patient.     Michiel Cowboy, RN  03/28/2018  Medical screening examination/treatment/procedure(s) were performed by non-physician practitioner and as supervising physician I was immediately available for consultation/collaboration. I agree with above. Scarlette Calico, MD

## 2018-03-31 NOTE — Progress Notes (Signed)
Reviewed and agree with initial management plan. 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 Rush Landmark. Repeat EGD and EUS by Dr. Rush Landmark is planned to determine if the gastric NET was completely removed and for gastric mapping.   Pricilla Riffle. Fuller Plan, MD [facility]

## 2018-04-03 ENCOUNTER — Telehealth: Payer: Self-pay | Admitting: Gastroenterology

## 2018-04-03 ENCOUNTER — Other Ambulatory Visit: Payer: Medicare Other

## 2018-04-03 DIAGNOSIS — K2940 Chronic atrophic gastritis without bleeding: Secondary | ICD-10-CM

## 2018-04-03 NOTE — Telephone Encounter (Signed)
All questions about upcoming procedure answered.  She will call back for any additional questions or concerns.

## 2018-04-05 LAB — GASTRIN: Gastrin: 129 pg/mL — ABNORMAL HIGH (ref <=100)

## 2018-04-08 ENCOUNTER — Other Ambulatory Visit: Payer: Self-pay

## 2018-04-08 DIAGNOSIS — K2270 Barrett's esophagus without dysplasia: Secondary | ICD-10-CM

## 2018-04-08 MED ORDER — FAMOTIDINE 40 MG PO TABS: 40.0000 mg | ORAL_TABLET | Freq: Every day | ORAL | 3 refills | Status: DC

## 2018-04-16 ENCOUNTER — Ambulatory Visit: Payer: Medicare Other | Admitting: Gastroenterology

## 2018-04-24 ENCOUNTER — Encounter (HOSPITAL_COMMUNITY)
Admission: RE | Disposition: A | Discharge status: Home / Self Care | Payer: Self-pay | Source: Ambulatory Visit | Attending: Gastroenterology

## 2018-04-24 ENCOUNTER — Ambulatory Visit (HOSPITAL_COMMUNITY)
Admission: RE | Admit: 2018-04-24 | Discharge: 2018-04-24 | Disposition: A | Discharge status: Home / Self Care | Payer: Medicare Other | Source: Ambulatory Visit | Attending: Gastroenterology | Admitting: Gastroenterology

## 2018-04-24 ENCOUNTER — Ambulatory Visit (HOSPITAL_COMMUNITY): Payer: Medicare Other | Admitting: Anesthesiology

## 2018-04-24 ENCOUNTER — Other Ambulatory Visit: Payer: Self-pay

## 2018-04-24 ENCOUNTER — Encounter (HOSPITAL_COMMUNITY): Payer: Self-pay | Admitting: Anesthesiology

## 2018-04-24 DIAGNOSIS — I899 Noninfective disorder of lymphatic vessels and lymph nodes, unspecified: Secondary | ICD-10-CM

## 2018-04-24 DIAGNOSIS — E669 Obesity, unspecified: Secondary | ICD-10-CM | POA: Insufficient documentation

## 2018-04-24 DIAGNOSIS — K3189 Other diseases of stomach and duodenum: Secondary | ICD-10-CM | POA: Diagnosis not present

## 2018-04-24 DIAGNOSIS — K228 Other specified diseases of esophagus: Secondary | ICD-10-CM | POA: Diagnosis not present

## 2018-04-24 DIAGNOSIS — Z7984 Long term (current) use of oral hypoglycemic drugs: Secondary | ICD-10-CM | POA: Diagnosis not present

## 2018-04-24 DIAGNOSIS — E119 Type 2 diabetes mellitus without complications: Secondary | ICD-10-CM | POA: Diagnosis not present

## 2018-04-24 DIAGNOSIS — Z6830 Body mass index (BMI) 30.0-30.9, adult: Secondary | ICD-10-CM | POA: Insufficient documentation

## 2018-04-24 DIAGNOSIS — I10 Essential (primary) hypertension: Secondary | ICD-10-CM | POA: Insufficient documentation

## 2018-04-24 DIAGNOSIS — R1900 Intra-abdominal and pelvic swelling, mass and lump, unspecified site: Secondary | ICD-10-CM | POA: Diagnosis present

## 2018-04-24 DIAGNOSIS — K317 Polyp of stomach and duodenum: Secondary | ICD-10-CM

## 2018-04-24 DIAGNOSIS — Z86718 Personal history of other venous thrombosis and embolism: Secondary | ICD-10-CM | POA: Diagnosis not present

## 2018-04-24 DIAGNOSIS — K2950 Unspecified chronic gastritis without bleeding: Secondary | ICD-10-CM | POA: Diagnosis not present

## 2018-04-24 DIAGNOSIS — K31819 Angiodysplasia of stomach and duodenum without bleeding: Secondary | ICD-10-CM | POA: Diagnosis not present

## 2018-04-24 DIAGNOSIS — D3A8 Other benign neuroendocrine tumors: Secondary | ICD-10-CM

## 2018-04-24 HISTORY — PX: POLYPECTOMY: SHX5525

## 2018-04-24 HISTORY — PX: ESOPHAGOGASTRODUODENOSCOPY (EGD) WITH PROPOFOL: SHX5813

## 2018-04-24 HISTORY — PX: BIOPSY: SHX5522

## 2018-04-24 HISTORY — PX: UPPER ESOPHAGEAL ENDOSCOPIC ULTRASOUND (EUS): SHX6562

## 2018-04-24 LAB — GLUCOSE, CAPILLARY
Glucose-Capillary: 102 mg/dL — ABNORMAL HIGH (ref 70–99)
Glucose-Capillary: 98 mg/dL (ref 70–99)

## 2018-04-24 SURGERY — ESOPHAGOGASTRODUODENOSCOPY (EGD) WITH PROPOFOL
Anesthesia: Monitor Anesthesia Care

## 2018-04-24 MED ORDER — PROPOFOL 10 MG/ML IV BOLUS
INTRAVENOUS | Status: AC
  Filled 2018-04-24: qty 40

## 2018-04-24 MED ORDER — ONDANSETRON HCL 4 MG/2ML IJ SOLN
INTRAMUSCULAR | Status: DC | PRN
  Administered 2018-04-24: 4 mg via INTRAVENOUS

## 2018-04-24 MED ORDER — LACTATED RINGERS IV SOLN
INTRAVENOUS | Status: DC
  Administered 2018-04-24: 1000 mL via INTRAVENOUS

## 2018-04-24 MED ORDER — PROPOFOL 10 MG/ML IV BOLUS
INTRAVENOUS | Status: AC
  Filled 2018-04-24: qty 20

## 2018-04-24 MED ORDER — SODIUM CHLORIDE 0.9 % IV SOLN: INTRAVENOUS | Status: DC

## 2018-04-24 MED ORDER — PROPOFOL 10 MG/ML IV BOLUS
INTRAVENOUS | Status: DC | PRN
  Administered 2018-04-24: 30 mg via INTRAVENOUS

## 2018-04-24 MED ORDER — PROPOFOL 500 MG/50ML IV EMUL
INTRAVENOUS | Status: DC | PRN
  Administered 2018-04-24: 100 ug/kg/min via INTRAVENOUS

## 2018-04-24 SURGICAL SUPPLY — 14 items

## 2018-04-24 NOTE — Discharge Instructions (Signed)

## 2018-04-24 NOTE — Anesthesia Postprocedure Evaluation (Signed)
Anesthesia Post Note  Patient: Mekaila Tarnow  Procedure(s) Performed: ESOPHAGOGASTRODUODENOSCOPY (EGD) WITH PROPOFOL (N/A ) ENDOSCOPIC MUCOSAL RESECTION (N/A ) UPPER ESOPHAGEAL ENDOSCOPIC ULTRASOUND (EUS) SUBMUCOSAL LIFTING INJECTION BIOPSY HEMOSTASIS CLIP PLACEMENT POLYPECTOMY     Patient location during evaluation: PACU Anesthesia Type: MAC Level of consciousness: awake and alert and oriented Pain management: pain level controlled Vital Signs Assessment: post-procedure vital signs reviewed and stable Respiratory status: spontaneous breathing, nonlabored ventilation and respiratory function stable Cardiovascular status: stable and blood pressure returned to baseline Postop Assessment: no apparent nausea or vomiting Anesthetic complications: no    Last Vitals:  Vitals:   04/24/18 1015 04/24/18 1150  BP: (!) 143/71 122/64  Pulse: 79 76  Resp: 18 13  Temp: 36.9 C 36.4 C  SpO2: 100% 100%    Last Pain:  Vitals:   04/24/18 1150  TempSrc: Oral                 Mia Winthrop A.

## 2018-04-24 NOTE — Transfer of Care (Addendum)
Immediate Anesthesia Transfer of Care Note  Patient: Katie [name]  Procedure(s) Performed: Procedure(s) with comments: ESOPHAGOGASTRODUODENOSCOPY (EGD) WITH PROPOFOL (N/A) - with EMR ENDOSCOPIC MUCOSAL RESECTION (N/A) UPPER ESOPHAGEAL ENDOSCOPIC ULTRASOUND (EUS) SUBMUCOSAL LIFTING INJECTION BIOPSY HEMOSTASIS CLIP PLACEMENT POLYPECTOMY  Patient Location: PACU  Anesthesia Type:MAC  Level of Consciousness:  sedated, patient cooperative and responds to stimulation  Airway & Oxygen Therapy:Patient Spontanous Breathing and Patient connected to face mask oxgen  Post-op Assessment:  Report given to PACU RN and Post -op Vital signs reviewed and stable  Post vital signs:  Reviewed and stable  Last Vitals:  Vitals:   04/24/18 1220 04/24/18 1230  BP: (!) 138/97 (!) 149/78  Pulse: 72 66  Resp: 15 14  Temp:    SpO2: 097% 35%    Complications: No apparent anesthesia complications

## 2018-04-24 NOTE — Anesthesia Preprocedure Evaluation (Addendum)
Anesthesia Evaluation  Patient identified by MRN, date of birth, ID band Patient awake    Reviewed: Allergy & Precautions, NPO status , Patient's Chart, lab work & pertinent test results  Airway Mallampati: II  TM Distance: >3 FB Neck ROM: Full    Dental no notable dental hx. (+) Teeth Intact   Pulmonary neg pulmonary ROS,    Pulmonary exam normal breath sounds clear to auscultation       Cardiovascular hypertension, Pt. on medications + DVT  Normal cardiovascular exam Rate:Normal     Neuro/Psych PSYCHIATRIC DISORDERS Anxiety Hx/o right meralgia paresthetica  Neuromuscular disease    GI/Hepatic Neg liver ROS, Gastric neuroendocrine tumor   Endo/Other  diabetes, Well Controlled, Type 2, Oral Hypoglycemic AgentsHypothyroidism Pure hypercholesterolemia Obesity  Renal/GU Renal InsufficiencyRenal disease  negative genitourinary   Musculoskeletal  (+) Arthritis , Osteoarthritis,    Abdominal (+) + obese,   Peds  Hematology  (+) anemia ,   Anesthesia Other Findings   Reproductive/Obstetrics                            Anesthesia Physical Anesthesia Plan  ASA: III  Anesthesia Plan: MAC   Post-op Pain Management:    Induction: Intravenous  PONV Risk Score and Plan: 2 and Ondansetron, Propofol infusion and Treatment may vary due to age or medical condition  Airway Management Planned: Natural Airway and Nasal Cannula  Additional Equipment:   Intra-op Plan:   Post-operative Plan:   Informed Consent: I have reviewed the patients History and Physical, chart, labs and discussed the procedure including the risks, benefits and alternatives for the proposed anesthesia with the patient or authorized representative who has indicated his/her understanding and acceptance.   Dental advisory given  Plan Discussed with: CRNA and Surgeon  Anesthesia Plan Comments:         Anesthesia Quick  Evaluation

## 2018-04-24 NOTE — Progress Notes (Signed)
Dr. Rush Landmark discussed left eye situation with patient,no drainage or irritation noted.

## 2018-04-24 NOTE — Op Note (Signed)
[facility] Patient Name: Katie [name] Procedure Date: 04/24/2018 MRN: [id number] Attending MD: Justice Britain , MD Date of Birth: 1946/04/15 CSN: [id number] Age: 72 Admit Type: Outpatient Procedure:                Upper EUS Indications:              Gastric mucosal mass/polyp found on endoscopy,                            Intestinal metaplasia, Gastric NET (s/p prior                            resection) for further tissue resection Providers:                Justice Britain, MD, Carlyn Reichert, RN, Charolette Child, Technician, Anne Fu CRNA, CRNA Referring MD:             Pricilla Riffle. Fuller Plan, MD, Amy Esterwood PA, PA Medicines:                Monitored Anesthesia Care Complications:            No immediate complications. Estimated Blood Loss:     Estimated blood loss was minimal. Procedure:                Pre-Anesthesia Assessment:                           - Prior to the procedure, a History and Physical                            was performed, and patient medications and                            allergies were reviewed. The patient's tolerance of                            previous anesthesia was also reviewed. The risks                            and benefits of the procedure and the sedation                            options and risks were discussed with the patient.                            All questions were answered, and informed consent                            was obtained. Prior Anticoagulants: The patient has                            taken aspirin. ASA Grade Assessment: III - A  patient with severe systemic disease. After                            reviewing the risks and benefits, the patient was                            deemed in satisfactory condition to undergo the                            procedure.                           After obtaining informed consent, the endoscope was                             passed under direct vision. Throughout the                            procedure, the patient's blood pressure, pulse, and                            oxygen saturations were monitored continuously. The                            [id number] ([id number]) Olympus EGD Therapeutic was                            introduced through the mouth, and advanced to the                            second part of duodenum. The [id number] ([id number])                            Olympus Radial EUS was introduced through the                            mouth, and advanced to the stomach for ultrasound                            examination. After obtaining informed consent, the                            endoscope was passed under direct vision.                            Throughout the procedure, the patient's blood                            pressure, pulse, and oxygen saturations were                            monitored continuously.The upper EUS was                            accomplished  without difficulty. The patient                            tolerated the procedure. Scope In: Scope Out: Findings:      ENDOSCOPIC FINDING: :      There were esophageal mucosal changes consistent with short-segment       Barrett's esophagus present in the proximal esophagus. Previously       biopsied.      No gross lesions were noted in the entire esophagus otherwise.      A single 4 mm no bleeding angiodysplastic lesion was found on the       anterior wall of the lesser curvature of the gastric body.      Two 2 to 3 mm sessile polyps with no stigmata of recent bleeding were       found on the anterior wall of the lesser curvature of the gastric body,       near the angiodysplasia. The polyps were removed with a cold snare       technique. Resection and retrieval were complete. Not clear if these are       adenomatous or potentially other NETs      A 4 mm post polypectomy scar was found on the  greater curvature of the       gastric body. There was no evidence of the previous polyp/NET that had       been removed previously. Preparations were made for mucosal resection of       the scar site to ensure negative margins of previous resection site.       Saline was injected to attempt to raise the lesion, however this was       scarred down and did not raise. Decision was made to proceed with Duette       XL cap assistance snare mucosal resection was performed. Resection and       retrieval were complete. To close the defect after mucosal resection,       three hemostatic clips were successfully placed (MR conditional). There       was no bleeding during, or at the end, of the procedure from this region.      No other gross lesions were noted in the entire examined stomach. Due to       intestinal metaplasia found on previous EGD, need for gastric mapping       biopsies was performed. Biopsies were taken with a cold forceps for       histology from the fundus. Biopsies were taken with a cold forceps for       histology from the cardia. Biopsies were taken with a cold forceps for       histology from the greater curve. Biopsies were taken with a cold       forceps for histology from the lesser curve. Biopsies were taken with a       cold forceps for histology from the incisura. Biopsies were taken with a       cold forceps for histology from the antrum.      No gross lesions were noted in the duodenal bulb, in the first portion       of the duodenum and in the second portion of the duodenum.      ENDOSONOGRAPHIC FINDING: :      Endosonographic imaging in the entire stomach showed no intramural       (  subepithelial) lesion, mass or wall thickening. Area in region of       previous scar site does not show any evidence of significant change.      No malignant-appearing lymph nodes were visualized in the left gastric       region (level 17), gastrohepatic ligament (level 18) and celiac  region       (level 20).      Endosonographic imaging in the visualized portion of the liver showed no       mass-lesion.      The celiac region was visualized. Impression:               - Esophageal mucosal changes consistent with                            short-segment Barrett's esophagus. Previously                            biopsied. No other gross lesions in esophagus.                           - A single non-bleeding angiodysplastic lesion in                            the stomach as previously noted.                           - Two gastric polyps vs NETs. Resected and                            retrieved.                           - Scar in the gastric body (greater curvature).                            Mucosal resection performed of the scar site to                            ensure negative margins. Clips (MR conditional)                            were placed to close the defect.                           - No other gross lesions in the stomach. Gastric                            mapping biopsies performed..                           - No gross lesions in the duodenal bulb, in the                            first portion of the duodenum and in the second  portion of the duodenum.                           EUS Impression:                           - No malignant-appearing lymph nodes were                            visualized in the left gastric region (level 17),                            gastrohepatic ligament (level 18) and celiac region                            (level 20).                           - No evidence of intramural mass-lesions noted on                            EUS within the stomach. Moderate Sedation:      Not Applicable - Patient had care per Anesthesia. Recommendation:           - The patient will be observed post-procedure,                            until all discharge criteria are met.                           -  Discharge patient to home.                           - Observe patient's clinical course.                           - Continue present medications (Famotidine twice                            daily).                           - No ibuprofen, naproxen, or other non-steroidal                            anti-inflammatory drugs for 1 week.                           - Await path results for determination of next                            steps in evaluation/therapy.                           - Patient needs Chromogranin to be obtained off PPI                            (  previously to be obtained by referring provider).                           - Return to GI clinic.                           - The findings and recommendations were discussed                            with the patient.                           - The findings and recommendations were discussed                            with the patient's family. Procedure Code(s):        --- Professional ---                           [phone], Esophagogastroduodenoscopy, flexible,                            transoral; with endoscopic mucosal resection                           43251, 61, Esophagogastroduodenoscopy, flexible,                            transoral; with removal of tumor(s), polyp(s), or                            other lesion(s) by snare technique                           43237, Esophagogastroduodenoscopy, flexible,                            transoral; with endoscopic ultrasound examination                            limited to the esophagus, stomach or duodenum, and                            adjacent structures                           43239, 59, Esophagogastroduodenoscopy, flexible,                            transoral; with biopsy, single or multiple Diagnosis Code(s):        --- Professional ---                           K22.8, Other specified diseases of esophagus                           K31.819, Angiodysplasia of  stomach and duodenum  without bleeding                           K31.7, Polyp of stomach and duodenum                           K31.89, Other diseases of stomach and duodenum                           I89.9, Noninfective disorder of lymphatic vessels                            and lymph nodes, unspecified CPT copyright 2018 American Medical Association. All rights reserved. The codes documented in this report are preliminary and upon coder review may  be revised to meet current compliance requirements. Justice Britain, MD 04/24/2018 12:05:15 PM Number of Addenda: 0

## 2018-04-24 NOTE — H&P (Signed)
GASTROENTEROLOGY OUTPATIENT PROCEDURE H&P NOTE   Primary Care Physician: Janith Lima, MD  HPI: Katie [name] is a 72 y.o. female who presents for EGD/EUS with possible EMR.  Past Medical History:  Diagnosis Date  . Acute venous embolism and thrombosis of unspecified deep vessels of lower extremity 2001   RLE DVT and PE, coumadin x 37mo, now ASSA  . Anxiety state, unspecified   . Chronic idiopathic constipation 11/02/2016  . Degeneration of cervical intervertebral disc   . Gallstones   . History of DVT (deep vein thrombosis)   . Iron deficiency anemia secondary to blood loss (chronic)   . Irritable bowel syndrome   . Lumbago   . Other B-complex deficiencies   . Personal history of colonic polyps   . Pure hypercholesterolemia   . Reflux esophagitis   . Tubular adenoma of colon 09/2002  . Type II or unspecified type diabetes mellitus without mention of complication, not stated as uncontrolled   . Unspecified essential hypertension   . Unspecified hypothyroidism   . Unspecified iridocyclitis   . Unspecified venous (peripheral) insufficiency   . Unspecified vitamin D deficiency    Past Surgical History:  Procedure Laterality Date  . CARPAL TUNNEL RELEASE    . FEMORAL HERNIA REPAIR    . OOPHORECTOMY    . VESICOVAGINAL FISTULA CLOSURE W/ TAH     Current Facility-Administered Medications  Medication Dose Route Frequency Provider Last Rate Last Dose  . 0.9 %  sodium chloride infusion   Intravenous Continuous Mansouraty, Telford Nab., MD      . lactated ringers infusion   Intravenous Continuous Mansouraty, Telford Nab., MD 125 mL/hr at 04/24/18 1016 1,000 mL at 04/24/18 1016   Allergies  Allergen Reactions  . Metoprolol Succinate     Unknown reaction  . Penicillins Swelling    Has patient had a PCN reaction causing immediate rash, facial/tongue/throat swelling, SOB or lightheadedness with hypotension: Yes Has patient had a PCN reaction causing severe rash involving mucus  membranes or skin necrosis: No Has patient had a PCN reaction that required hospitalization: No Has patient had a PCN reaction occurring within the last 10 years: No If all of the above answers are "NO", then may proceed with Cephalosporin use.   . Sulfamethoxazole-Trimethoprim     Unknown reaction  . Metformin Nausea Only   Family History  Problem Relation Age of Onset  . Prostate cancer Father   . Diabetes Mother        retinopathy/blind  . Deep vein thrombosis Mother   . Hypertension Mother   . Healthy Sister    Social History   Socioeconomic History  . Marital status: Married    Spouse name: Zarra Geffert  . Number of children: 0  . Years of education: masters  . Highest education level: Not on file  Occupational History  . Occupation: record specialist    Employer: [employer]  Social Needs  . Financial resource strain: Not hard at all  . Food insecurity:    Worry: Never true    Inability: Never true  . Transportation needs:    Medical: No    Non-medical: No  Tobacco Use  . Smoking status: Never Smoker  . Smokeless tobacco: Never Used  Substance and Sexual Activity  . Alcohol use: Yes    Alcohol/week: 0.0 standard drinks    Comment: occasional  . Drug use: No  . Sexual activity: Not Currently  Lifestyle  . Physical activity:    Days  per week: 3 days    Minutes per session: 50 min  . Stress: Not at all  Relationships  . Social connections:    Talks on phone: More than three times a week    Gets together: More than three times a week    Attends religious service: More than 4 times per year    Active member of club or organization: Yes    Attends meetings of clubs or organizations: More than 4 times per year    Relationship status: Married  . Intimate partner violence:    Fear of current or ex partner: No    Emotionally abused: No    Physically abused: No    Forced sexual activity: No  Other Topics Concern  . Not on file  Social History Narrative    Lives with husband in a 2 story home with a basement.  Has no children.     Retired from the police department.     Education: masters (guidance and counseling).     Physical Exam: Vital signs in last 24 hours: Temp:  [98.5 F (36.9 C)] 98.5 F (36.9 C) (12/11 1015) Pulse Rate:  [79] 79 (12/11 1015) Resp:  [18] 18 (12/11 1015) BP: (143)/(71) 143/71 (12/11 1015) SpO2:  [100 %] 100 % (12/11 1015) Weight:  [82.6 kg] 82.6 kg (12/11 1015)   GEN: NAD EYE: Sclerae anicteric ENT: MMM CV: RR without R/Gs  RESP: CTAB posteriorly GI: Soft, NT/ND NEURO:  Alert & Oriented x 3  Lab Results: No results for input(s): WBC, HGB, HCT, PLT in the last 72 hours. BMET No results for input(s): NA, K, CL, CO2, GLUCOSE, BUN, CREATININE, CALCIUM in the last 72 hours. LFT No results for input(s): PROT, ALBUMIN, AST, ALT, ALKPHOS, BILITOT, BILIDIR, IBILI in the last 72 hours. PT/INR No results for input(s): LABPROT, INR in the last 72 hours.   Impression / Plan: This is a 72 y.o.female who presents for EGD/EUS with possible EMR.  The risks and benefits of endoscopic evaluation were discussed with the patient; these include but are not limited to the risk of perforation, infection, bleeding, missed lesions, lack of diagnosis, severe illness requiring hospitalization, as well as anesthesia and sedation related illnesses.  The patient is agreeable to proceed.    Justice Britain, MD [facility] Office # [phone]

## 2018-04-24 NOTE — Anesthesia Postprocedure Evaluation (Signed)
Anesthesia Post Note  Patient: Katie [name]  Procedure(s) Performed: ESOPHAGOGASTRODUODENOSCOPY (EGD) WITH PROPOFOL (N/A ) ENDOSCOPIC MUCOSAL RESECTION (N/A ) UPPER ESOPHAGEAL ENDOSCOPIC ULTRASOUND (EUS) SUBMUCOSAL LIFTING INJECTION BIOPSY HEMOSTASIS CLIP PLACEMENT POLYPECTOMY     Patient location during evaluation: PACU Anesthesia Type: MAC Level of consciousness: awake and alert and oriented Pain management: pain level controlled Vital Signs Assessment: post-procedure vital signs reviewed and stable Respiratory status: spontaneous breathing, nonlabored ventilation and respiratory function stable Cardiovascular status: stable and blood pressure returned to baseline Postop Assessment: no apparent nausea or vomiting Anesthetic complications: no    Last Vitals:  Vitals:   04/24/18 1220 04/24/18 1230  BP: (!) 138/97 (!) 149/78  Pulse: 72 66  Resp: 15 14  Temp:    SpO2: 100% 92%    Last Pain:  Vitals:   04/24/18 1150  TempSrc: Oral                 [name] A.

## 2018-04-25 ENCOUNTER — Encounter: Payer: Self-pay | Admitting: Gastroenterology

## 2018-04-26 ENCOUNTER — Encounter (HOSPITAL_COMMUNITY): Payer: Self-pay | Admitting: Gastroenterology

## 2018-04-29 LAB — HM MAMMOGRAPHY

## 2018-05-05 ENCOUNTER — Other Ambulatory Visit: Payer: Self-pay | Admitting: Internal Medicine

## 2018-05-05 DIAGNOSIS — I10 Essential (primary) hypertension: Secondary | ICD-10-CM

## 2018-05-10 ENCOUNTER — Other Ambulatory Visit: Payer: Self-pay | Admitting: Internal Medicine

## 2018-05-10 DIAGNOSIS — I10 Essential (primary) hypertension: Secondary | ICD-10-CM

## 2018-05-13 ENCOUNTER — Telehealth: Payer: Self-pay | Admitting: Gastroenterology

## 2018-05-13 DIAGNOSIS — K2270 Barrett's esophagus without dysplasia: Secondary | ICD-10-CM

## 2018-05-13 MED ORDER — FAMOTIDINE 40 MG PO TABS: 40.0000 mg | ORAL_TABLET | Freq: Every day | ORAL | 3 refills | Status: DC

## 2018-05-13 MED ORDER — LUBIPROSTONE 24 MCG PO CAPS: 24.0000 ug | ORAL_CAPSULE | Freq: Two times a day (BID) | ORAL | 0 refills | Status: DC

## 2018-05-13 NOTE — Telephone Encounter (Signed)
Pt is requesting rf for famotidine sent to [facility] on [address]. Her regular pharmacy [facility] no longer carries that medicine. She also needs a call, she stated that she has given samples of  amitiza 51mcg but does not remember how to take them.

## 2018-05-13 NOTE — Telephone Encounter (Signed)
Informed patient I sent the prescription of famotidine to [facility]. Also informed patient to take Amitiza 24 mcg twice daily until finished and call our office back if the medication works. Patient verbalized understanding.

## 2018-06-05 ENCOUNTER — Encounter: Payer: Self-pay | Admitting: Internal Medicine

## 2018-06-05 NOTE — Progress Notes (Signed)
Abstracted and sent to scan  

## 2018-06-23 ENCOUNTER — Other Ambulatory Visit: Payer: Self-pay | Admitting: Internal Medicine

## 2018-06-23 DIAGNOSIS — F411 Generalized anxiety disorder: Secondary | ICD-10-CM

## 2018-07-01 ENCOUNTER — Encounter: Payer: Self-pay | Admitting: Internal Medicine

## 2018-07-01 ENCOUNTER — Ambulatory Visit: Payer: Medicare Other | Admitting: Internal Medicine

## 2018-07-01 VITALS — BP 136/86 | HR 93 | Temp 98.2°F | Resp 16 | Ht 65.0 in | Wt 180.0 lb

## 2018-07-01 DIAGNOSIS — R739 Hyperglycemia, unspecified: Secondary | ICD-10-CM

## 2018-07-01 DIAGNOSIS — G5711 Meralgia paresthetica, right lower limb: Secondary | ICD-10-CM | POA: Diagnosis not present

## 2018-07-01 DIAGNOSIS — I10 Essential (primary) hypertension: Secondary | ICD-10-CM

## 2018-07-01 DIAGNOSIS — E039 Hypothyroidism, unspecified: Secondary | ICD-10-CM

## 2018-07-01 NOTE — Patient Instructions (Signed)

## 2018-07-01 NOTE — Progress Notes (Signed)
Subjective:  Patient ID: Katie [name], female    DOB: [birth year]  Age: 73 y.o. MRN: [id number]  CC: Hypertension and Hypothyroidism   HPI Katie [name] presents for f/up - She continues to complain of intermittent right thigh pain.  She tells me she is getting symptom relief with therapy that is provided by a chiropractor.  She has decided not to take Lyrica for this discomfort.  She tells me she saw her endocrinologist about a month ago and her blood sugar and thyroid levels were normal.  She offers no new complaints today.  Outpatient Medications Prior to Visit  Medication Sig Dispense Refill  . ACCU-CHEK AVIVA PLUS test strip     . ALPRAZolam (XANAX) 0.5 MG tablet Take 1 tablet (0.5 mg total) by mouth 3 (three) times daily as needed for anxiety or sleep. 270 tablet 1  . aspirin EC 81 MG tablet Take 81 mg by mouth daily.    . Biotin (BIOTIN 5000) 5 MG CAPS Take 5 mg by mouth daily.    . cyanocobalamin (,VITAMIN B-12,) 1000 MCG/ML injection 1000 mcg subq once a month 30 mL 1  . ergocalciferol (VITAMIN D2) 50000 UNITS capsule Take 50,000 Units by mouth every Saturday.     . famotidine (PEPCID) 40 MG tablet Take 1 tablet (40 mg total) by mouth daily. 30 tablet 3  . furosemide (LASIX) 20 MG tablet TAKE 1 TABLET BY MOUTH ONCE DAILY 90 tablet 0  . [employer] (SIMPLE DIAGNOSTICS LANCING DEV) MISC     . levothyroxine (SYNTHROID, LEVOTHROID) 88 MCG tablet Take 88 mcg by mouth daily.      Marland Kitchen lisinopril (PRINIVIL,ZESTRIL) 10 MG tablet TAKE 1 TABLET BY MOUTH ONCE DAILY 90 tablet 1  . rosuvastatin (CRESTOR) 10 MG tablet Take 10 mg by mouth daily.    . Dulaglutide (TRULICITY) 1.5 PP/5.0DT SOPN Inject 1.5 mg into the skin every 14 (fourteen) days.     Vladimir Faster Glycol-Propyl Glycol (SYSTANE OP) Place 1 drop into both eyes 2 (two) times daily.    . vitamin E 400 UNIT capsule Take 400 Units by mouth daily.    Marland Kitchen lubiprostone (AMITIZA) 24 MCG capsule Take 1 capsule (24 mcg total) by mouth 2 (two) times  daily with a meal. 20 capsule 0  . pregabalin (LYRICA) 75 MG capsule Take 1 capsule (75 mg total) by mouth at bedtime. (Patient not taking: Reported on 07/01/2018) 90 capsule 1   No facility-administered medications prior to visit.     ROS Review of Systems  Constitutional: Negative.  Negative for diaphoresis, fatigue and unexpected weight change.  HENT: Negative.   Eyes: Negative for visual disturbance.  Respiratory: Negative for cough, chest tightness and wheezing.   Cardiovascular: Negative for chest pain, palpitations and leg swelling.  Gastrointestinal: Negative for abdominal pain, constipation, diarrhea, nausea and vomiting.  Endocrine: Negative for cold intolerance and heat intolerance.  Genitourinary: Negative.  Negative for difficulty urinating.  Musculoskeletal: Positive for arthralgias and myalgias. Negative for back pain and neck pain.       Diffuse right thigh pain  Skin: Negative.  Negative for color change, pallor and rash.  Neurological: Negative.  Negative for dizziness, tremors, light-headedness, numbness and headaches.  Hematological: Negative for adenopathy. Does not bruise/bleed easily.  Psychiatric/Behavioral: Negative.     Objective:  BP 136/86 (BP Location: Left Arm, Patient Position: Sitting, Cuff Size: Normal)   Pulse 93   Temp 98.2 F (36.8 C) (Oral)   Resp 16   Ht 5'  5" (1.651 m)   Wt 180 lb (81.6 kg)   SpO2 95%   BMI 29.95 kg/m   BP Readings from Last 3 Encounters:  07/01/18 136/86  04/24/18 (!) 160/78  03/28/18 136/82    Wt Readings from Last 3 Encounters:  07/01/18 180 lb (81.6 kg)  04/24/18 182 lb (82.6 kg)  03/28/18 182 lb (82.6 kg)    Physical Exam Vitals signs reviewed.  Constitutional:      Appearance: She is not diaphoretic.  HENT:     Nose: Nose normal. No congestion.     Mouth/Throat:     Mouth: Mucous membranes are moist.     Pharynx: Oropharynx is clear. No oropharyngeal exudate or posterior oropharyngeal erythema.    Eyes:     General: No scleral icterus.    Conjunctiva/sclera: Conjunctivae normal.  Neck:     Musculoskeletal: Normal range of motion and neck supple. No muscular tenderness.  Cardiovascular:     Rate and Rhythm: Normal rate and regular rhythm.     Pulses: Normal pulses.     Heart sounds: No murmur. No gallop.   Pulmonary:     Effort: Pulmonary effort is normal. No respiratory distress.     Breath sounds: Normal breath sounds. No stridor. No wheezing, rhonchi or rales.  Abdominal:     General: Bowel sounds are normal.     Palpations: There is no mass.     Tenderness: There is no abdominal tenderness. There is no guarding.  Musculoskeletal: Normal range of motion.        General: No swelling.     Right lower leg: No edema.     Left lower leg: No edema.  Lymphadenopathy:     Cervical: No cervical adenopathy.  Skin:    General: Skin is warm and dry.     Coloration: Skin is not pale.  Neurological:     General: No focal deficit present.     Mental Status: She is oriented to person, place, and time. Mental status is at baseline.     Lab Results  Component Value Date   WBC 4.6 12/13/2015   HGB 13.3 12/13/2015   HCT 38.9 12/13/2015   PLT 218.0 12/13/2015   GLUCOSE 100 (H) 12/13/2015   CHOL 125 01/15/2018   TRIG 72 01/15/2018   HDL 59 01/15/2018   LDLDIRECT 105.9 07/04/2007   LDLCALC 30 01/15/2018   ALT 12 01/15/2018   AST 22 01/15/2018   NA 141 01/15/2018   K 4.7 01/15/2018   CL 104 12/13/2015   CREATININE 1.3 (A) 01/15/2018   BUN 12 01/15/2018   CO2 33 (H) 12/13/2015   TSH 1.36 01/15/2018   HGBA1C 5.2 01/15/2018   MICROALBUR 0.0 05/18/2017    No results found.  Assessment & Plan:   Katie [name] was seen today for hypertension and hypothyroidism.  Diagnoses and all orders for this visit:  Essential hypertension- Her blood pressure is adequately well controlled.  Acquired hypothyroidism- Her recent TSH was in the normal range.  She will remain on the current dose  of levothyroxine.  Hyperglycemia- Her A1c is down to 5.2%.  Her blood sugars are over controlled.  I have advised her that I do not think she needs to use Trulicity and I have asked her to stop taking it.  Meralgia paresthetica, right- She has decided to treat this with the services of a chiropractor.   I have discontinued Eboney Vences's Dulaglutide, pregabalin, vitamin E, Polyethyl Glycol-Propyl Glycol (SYSTANE OP), and lubiprostone.  I am also having her maintain her levothyroxine, ergocalciferol, ACCU-CHEK AVIVA PLUS, SIMPLE DIAGNOSTICS LANCING DEV, cyanocobalamin, lisinopril, Biotin, rosuvastatin, aspirin EC, furosemide, famotidine, and ALPRAZolam.  No orders of the defined types were placed in this encounter.    Follow-up: Return in about 6 months (around 12/30/2018).  Scarlette Calico, MD

## 2018-07-21 ENCOUNTER — Other Ambulatory Visit: Payer: Self-pay | Admitting: Internal Medicine

## 2018-07-21 DIAGNOSIS — I10 Essential (primary) hypertension: Secondary | ICD-10-CM

## 2018-07-22 ENCOUNTER — Ambulatory Visit: Payer: Self-pay | Admitting: *Deleted

## 2018-07-22 NOTE — Telephone Encounter (Signed)
.    Reason for Disposition . [1] MILD pain (e.g., does not interfere with normal activities) AND [2] present > 7 days  Answer Assessment - Initial Assessment Questions 1. ONSET: "When did the pain start?"      *No Answer* 2. LOCATION: "Where is the pain located?"      *No Answer* 3. PAIN: "How bad is the pain?"    (Scale 1-10; or mild, moderate, severe)   -  MILD (1-3): doesn't interfere with normal activities    -  MODERATE (4-7): interferes with normal activities (e.g., work or school) or awakens from sleep, limping    -  SEVERE (8-10): excruciating pain, unable to do any normal activities, unable to walk     *No Answer* 4. WORK OR EXERCISE: "Has there been any recent work or exercise that involved this part of the body?"      *No Answer* 5. CAUSE: "What do you think is causing the leg pain?"     *No Answer* 6. OTHER SYMPTOMS: "Do you have any other symptoms?" (e.g., chest pain, back pain, breathing difficulty, swelling, rash, fever, numbness, weakness)     *No Answer* 7. PREGNANCY: "Is there any chance you are pregnant?" "When was your last menstrual period?"     *No Answer*  Protocols used: LEG PAIN-A-AH

## 2018-07-22 NOTE — Telephone Encounter (Signed)
Pt reports localizes right thigh pain x 2.5 months. States "Side of thigh by hip." Denies redness, warmth, swelling.States "Burning, tingling sensation at times." Reports 10/10 tenderness "Only when pressing on it, if I hit the right spot." States "I think it's coming from my back." Reports using Biofreeze" which is effective. Later states "It may be a muscle." Pt reports H/O DVT "Years ago".  States "This is nothing like that."  Also reports increased discomfort when laying on hip. States had something similar years ago in right hip area and was given cortisone injection. Appt made with Mindi Slicker for tomorrow. Instructed to go to ED if symptoms worsen, redness, warmth, swelling occurs. Reason for Disposition . [1] MILD pain (e.g., does not interfere with normal activities) AND [2] present > 7 days  Answer Assessment - Initial Assessment Questions 1. ONSET: "When did the pain start?"      January 2. LOCATION: "Where is the pain located?"      Right thigh "On side, near hip" 3. PAIN: "How bad is the pain?"    (Scale 1-10; or mild, moderate, severe)   -  MILD (1-3): doesn't interfere with normal activities    -  MODERATE (4-7): interferes with normal activities (e.g., work or school) or awakens from sleep, limping    -  SEVERE (8-10): excruciating pain, unable to do any normal activities, unable to walk     10/10 with palpation only 4. WORK OR EXERCISE: "Has there been any recent work or exercise that involved this part of the body?"      no 5. CAUSE: "What do you think is causing the leg pain?"     "Maybe my sciatica" 6. OTHER SYMPTOMS: "Do you have any other symptoms?" (e.g., chest pain, back pain, breathing difficulty, swelling, rash, fever, numbness, weakness)     no  Protocols used: LEG PAIN-A-AH

## 2018-07-23 ENCOUNTER — Ambulatory Visit: Payer: Medicare Other | Admitting: Family

## 2018-07-23 ENCOUNTER — Encounter: Payer: Self-pay | Admitting: Family

## 2018-07-23 VITALS — BP 128/72 | HR 90 | Temp 97.9°F | Ht 65.0 in | Wt 179.1 lb

## 2018-07-23 DIAGNOSIS — G5711 Meralgia paresthetica, right lower limb: Secondary | ICD-10-CM

## 2018-07-23 NOTE — Progress Notes (Signed)
Katie [name] is a 73 y.o. female with the following history as recorded in [employer]:  Patient Active Problem List   Diagnosis Date Noted  . Calculus of gallbladder with chronic cholecystitis without obstruction 02/06/2018  . Chronic renal disease, stage 3, moderately decreased glomerular filtration rate (GFR) between 30-59 mL/min/1.73 square meter (HCC) 01/24/2018  . Primary osteoarthritis of right hip 07/31/2017  . Obesity, Class I, BMI 30.0-34.9 (see actual BMI) 09/20/2016  . Spinal stenosis, lumbar region with neurogenic claudication 06/28/2016  . Meralgia paresthetica, right 05/30/2016  . Hyperglycemia 11/27/2012  . Vitamin D deficiency 03/05/2008  . Hyperlipidemia with target LDL less than 100 11/16/2007  . Hypothyroidism 04/03/2007  . B12 deficiency 04/03/2007  . GAD (generalized anxiety disorder) 04/03/2007  . PSEUDOTUMOR CEREBRI 04/03/2007  . IRITIS 04/03/2007  . Essential hypertension 04/03/2007  . History of DVT of lower extremity 04/03/2007  . Venous (peripheral) insufficiency 04/03/2007  . REFLUX ESOPHAGITIS 04/03/2007  . Irritable bowel syndrome with constipation 04/03/2007  . DEGENERATIVE DISC DISEASE, CERVICAL SPINE 04/03/2007    Current Outpatient Medications  Medication Sig Dispense Refill  . ACCU-CHEK AVIVA PLUS test strip     . ALPRAZolam (XANAX) 0.5 MG tablet Take 1 tablet (0.5 mg total) by mouth 3 (three) times daily as needed for anxiety or sleep. 270 tablet 1  . aspirin EC 81 MG tablet Take 81 mg by mouth daily.    . Biotin (BIOTIN 5000) 5 MG CAPS Take 5 mg by mouth daily.    . cyanocobalamin (,VITAMIN B-12,) 1000 MCG/ML injection 1000 mcg subq once a month 30 mL 1  . ergocalciferol (VITAMIN D2) 50000 UNITS capsule Take 50,000 Units by mouth every Saturday.     . famotidine (PEPCID) 40 MG tablet Take 1 tablet (40 mg total) by mouth daily. 30 tablet 3  . furosemide (LASIX) 20 MG tablet TAKE 1 TABLET BY MOUTH ONCE DAILY 90 tablet 0  . Lancet Devices (SIMPLE  DIAGNOSTICS LANCING DEV) MISC     . levothyroxine (SYNTHROID, LEVOTHROID) 88 MCG tablet Take 88 mcg by mouth daily.      Marland Kitchen lisinopril (PRINIVIL,ZESTRIL) 10 MG tablet Take 1 tablet by mouth once daily 90 tablet 1  . rosuvastatin (CRESTOR) 10 MG tablet Take 10 mg by mouth daily.     No current facility-administered medications for this visit.     Allergies: Metoprolol succinate; Penicillins; Sulfamethoxazole-trimethoprim; and Metformin  Past Medical History:  Diagnosis Date  . Acute venous embolism and thrombosis of unspecified deep vessels of lower extremity 2001   RLE DVT and PE, coumadin x 80mo, now ASSA  . Anxiety state, unspecified   . Chronic idiopathic constipation 11/02/2016  . Degeneration of cervical intervertebral disc   . Gallstones   . History of DVT (deep vein thrombosis)   . Iron deficiency anemia secondary to blood loss (chronic)   . Irritable bowel syndrome   . Lumbago   . Other B-complex deficiencies   . Personal history of colonic polyps   . Pure hypercholesterolemia   . Reflux esophagitis   . Tubular adenoma of colon 09/2002  . Type II or unspecified type diabetes mellitus without mention of complication, not stated as uncontrolled   . Unspecified essential hypertension   . Unspecified hypothyroidism   . Unspecified iridocyclitis   . Unspecified venous (peripheral) insufficiency   . Unspecified vitamin D deficiency     Past Surgical History:  Procedure Laterality Date  . BIOPSY  04/24/2018   Procedure: BIOPSY;  Surgeon: Justice Britain  Brooke Bonito., MD;  Location: [facility];  Service: Gastroenterology;;  . Wilmon Pali RELEASE    . ESOPHAGOGASTRODUODENOSCOPY (EGD) WITH PROPOFOL N/A 04/24/2018   Procedure: ESOPHAGOGASTRODUODENOSCOPY (EGD) WITH PROPOFOL;  Surgeon: Irving Copas., MD;  Location: [facility];  Service: Gastroenterology;  Laterality: N/A;  with EMR  . FEMORAL HERNIA REPAIR    . OOPHORECTOMY    . POLYPECTOMY  04/24/2018   Procedure:  POLYPECTOMY;  Surgeon: Mansouraty, Telford Nab., MD;  Location: [facility];  Service: Gastroenterology;;  . UPPER ESOPHAGEAL ENDOSCOPIC ULTRASOUND (EUS)  04/24/2018   Procedure: UPPER ESOPHAGEAL ENDOSCOPIC ULTRASOUND (EUS);  Surgeon: Rush Landmark Telford Nab., MD;  Location: [facility];  Service: Gastroenterology;;  . VESICOVAGINAL FISTULA CLOSURE W/ TAH      Family History  Problem Relation Age of Onset  . Prostate cancer Father   . Diabetes Mother        retinopathy/blind  . Deep vein thrombosis Mother   . Hypertension Mother   . Healthy Sister     Social History   Tobacco Use  . Smoking status: Never Smoker  . Smokeless tobacco: Never Used  Substance Use Topics  . Alcohol use: Yes    Alcohol/week: 0.0 standard drinks    Comment: occasional    Subjective:  Patient presents with concerns for persisting low back pain/ radiates into her right thigh; not a new problems for this patient- has seen orthopedics in the past year for the same symptoms; got relief from steroid injection in the past; talked to her PCP about these symptoms last month- told him she was getting relief from her chiropractor/ opted against using Lyrica.      Objective:  Vitals:   07/23/18 0948  BP: 128/72  Pulse: 90  Temp: 97.9 F (36.6 C)  TempSrc: Oral  SpO2: 98%  Weight: 179 lb 1.9 oz (81.2 kg)  Height: 5\' 5"  (1.651 m)    General: Well developed, well nourished, in no acute distress  Skin : Warm and dry.  Head: Normocephalic and atraumatic  Lungs: Respirations unlabored;  Musculoskeletal: No deformities; no active joint inflammation  Extremities: No edema, cyanosis, clubbing  Vessels: Symmetric bilaterally  Neurologic: Alert and oriented; speech intact; face symmetrical; moves all extremities well; CNII-XII intact without focal deficit   Assessment:  1. Meralgia paresthetica, right     Plan:  Not a new issue for this patient- symptoms documented x at least 2 years; had MRI in 08/2017;  encouraged to follow-up with specialist at [facility] and she agrees.   No follow-ups on file.  No orders of the defined types were placed in this encounter.   Requested Prescriptions    No prescriptions requested or ordered in this encounter

## 2018-07-23 NOTE — Patient Instructions (Signed)
Please schedule a follow-up with your orthopedist at [facility]

## 2018-08-03 ENCOUNTER — Other Ambulatory Visit: Payer: Self-pay | Admitting: Internal Medicine

## 2018-08-03 DIAGNOSIS — I10 Essential (primary) hypertension: Secondary | ICD-10-CM

## 2018-08-05 ENCOUNTER — Telehealth: Payer: Self-pay | Admitting: Gastroenterology

## 2018-08-05 NOTE — Telephone Encounter (Signed)
Pls call pt, she does not know how to take Amitiza, she was given samples last December.

## 2018-08-06 MED ORDER — LUBIPROSTONE 24 MCG PO CAPS: 24.0000 ug | ORAL_CAPSULE | Freq: Two times a day (BID) | ORAL | 3 refills | Status: DC

## 2018-08-06 NOTE — Telephone Encounter (Signed)
Patient states she never took Amitiza samples that were given to her back in November/2019 when she saw Nicoletta Ba, [place]. She states now she wants to try them and does not remember how to take them. Informed patient to them twice a day with meals. Also patient would like a prescription sent to the pharmacy in case the medication works. Informed patient I will send them to her pharmacy.

## 2018-08-11 IMAGING — DX DG LUMBAR SPINE COMPLETE 4+V
5 series · 5 of 5 positions shown · non-contrast
Comparison: Lumbar spine MRI March 15, 2013 and coronal and
sagittal CT images through the lumbar spine April 05, 2012

CLINICAL DATA: Right low back pain and right leg pain and numbness
extending to the foot intermittently for the past 4 months. No known
injury

EXAM:
LUMBAR SPINE - COMPLETE 4+ VIEW

[l-spine ap]
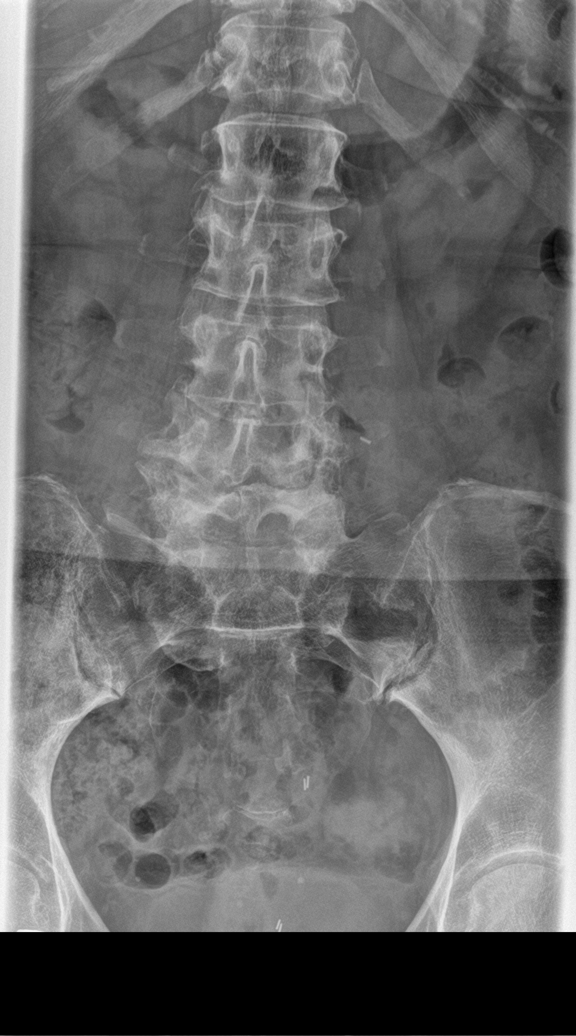

[l-spine obl (1 of 2)]
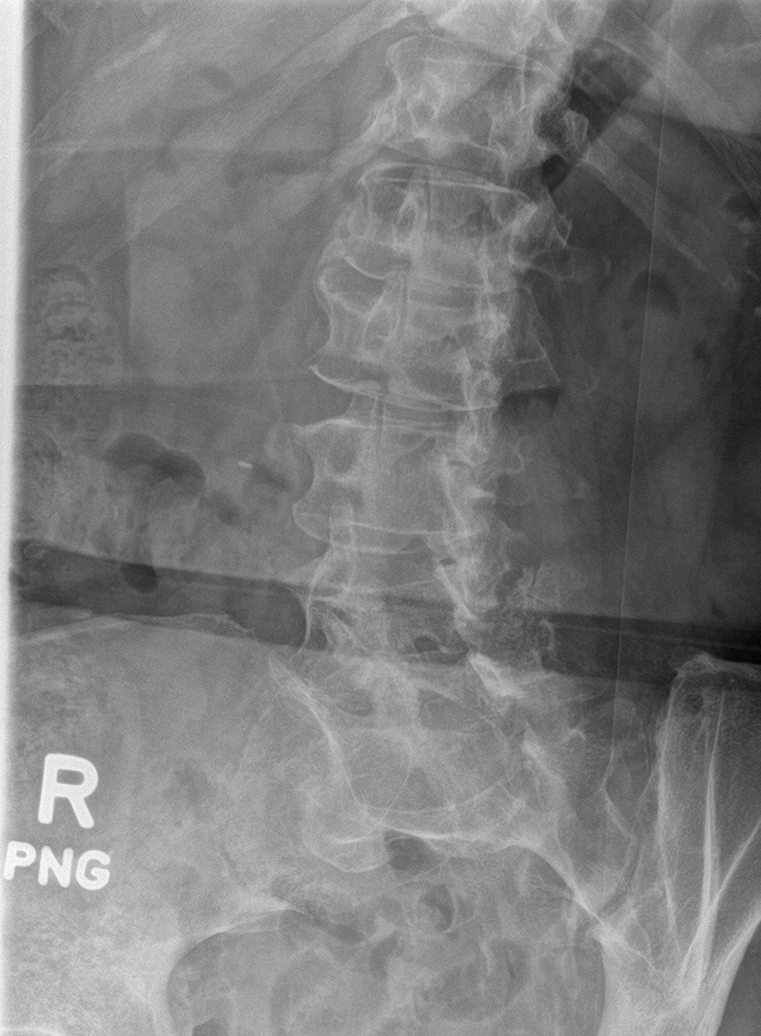

[l-spine obl (2 of 2)]
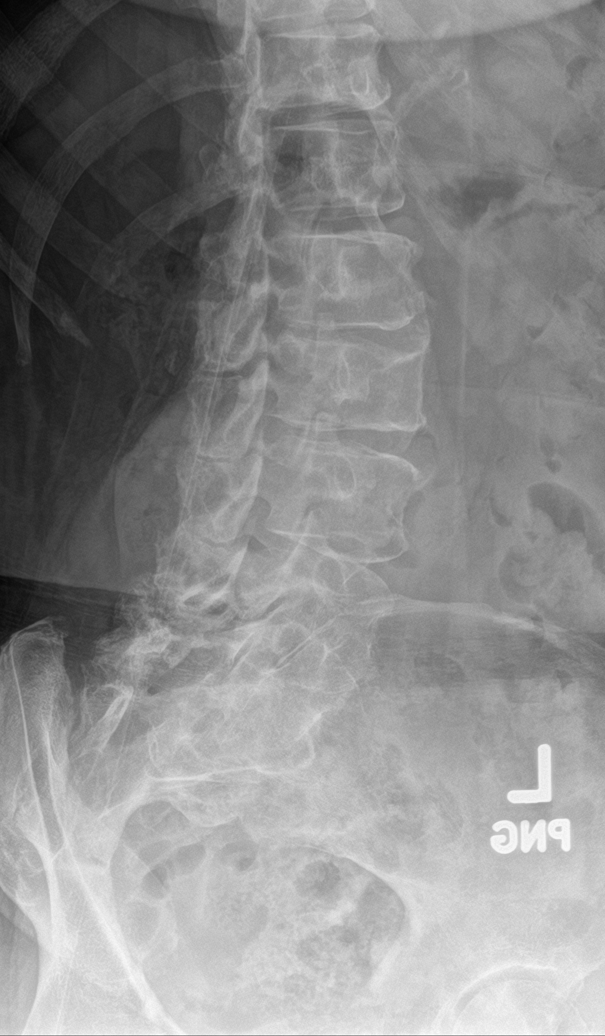

[l-spine lat]
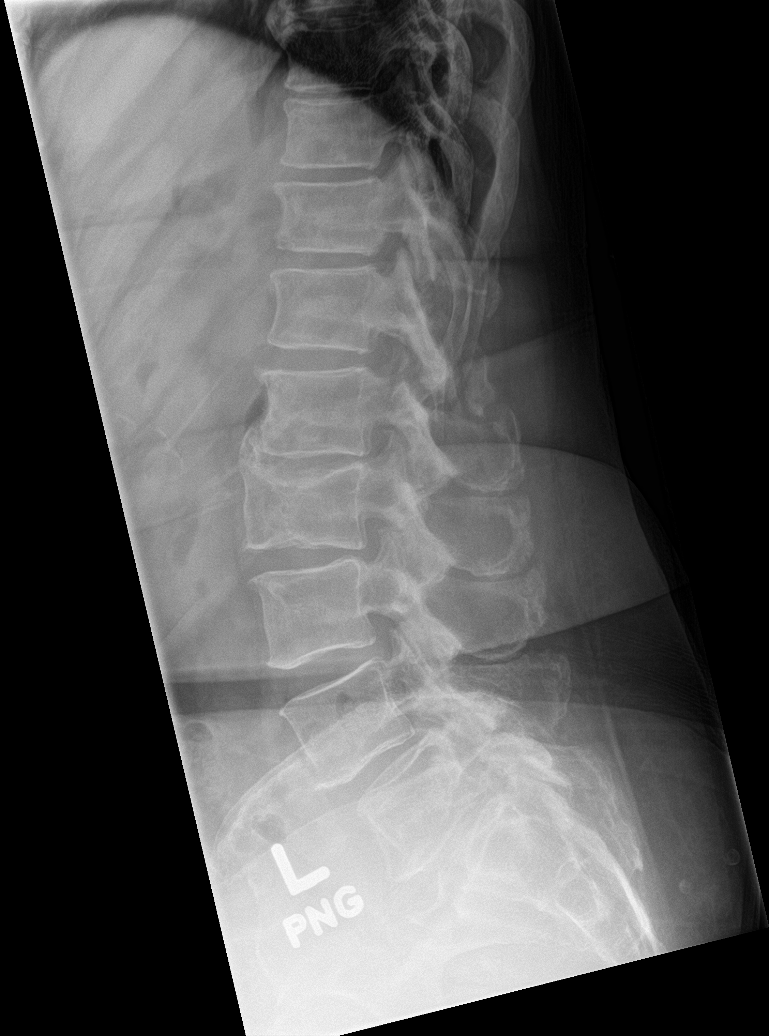

[l-spine spot]
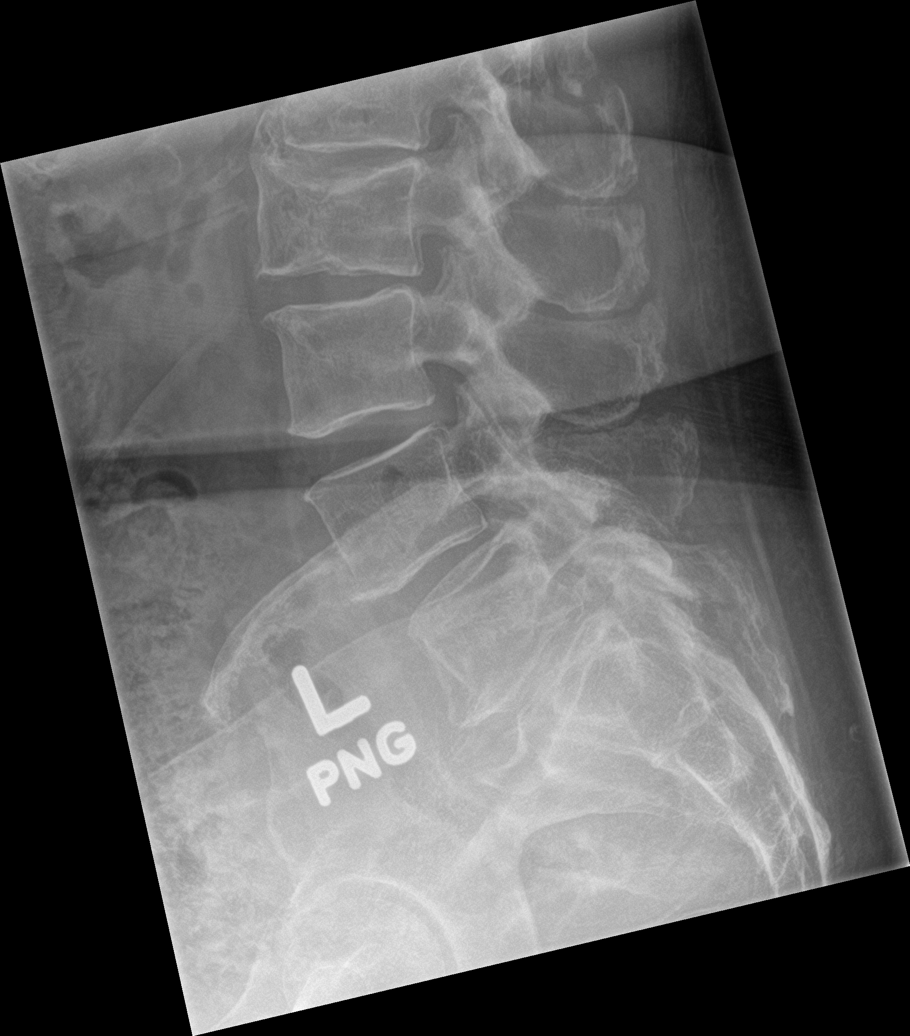

[5 of 5 positions shown; findings below may reference images not displayed]

FINDINGS: The bones are subjectively adequately mineralized. There is chronic
compression deformity of L2 which appears stable. There is an
anterior bridging osteophyte at L1-2. There is mild disc space
narrowing at L1-2 and L2-3 and L4-5. There is grade 1
anterolisthesis of L4 with respect L5 which is more conspicuous than
on the previous study. There is facet joint hypertrophy at L4-5 and
at L5-S1. No pars defects are observed.
IMPRESSION: Slight interval increase in the anterolisthesis of L4 with respect
to L5. Stable partial compression of L2. Degenerative disc disease
at L1-2, L2-3, and L4-5 which appears stable.

If the patient's symptoms warrant further evaluation, repeat lumbar
spine MRI would be useful and allow direct comparison to the
March 2013 study.

## 2018-08-31 ENCOUNTER — Other Ambulatory Visit: Payer: Self-pay | Admitting: Gastroenterology

## 2018-08-31 DIAGNOSIS — K2270 Barrett's esophagus without dysplasia: Secondary | ICD-10-CM

## 2018-10-18 ENCOUNTER — Other Ambulatory Visit: Payer: Self-pay | Admitting: Internal Medicine

## 2018-10-18 DIAGNOSIS — E538 Deficiency of other specified B group vitamins: Secondary | ICD-10-CM

## 2018-11-23 ENCOUNTER — Other Ambulatory Visit: Payer: Self-pay | Admitting: Internal Medicine

## 2018-11-23 DIAGNOSIS — I10 Essential (primary) hypertension: Secondary | ICD-10-CM

## 2018-12-03 LAB — HM DIABETES EYE EXAM

## 2018-12-06 ENCOUNTER — Other Ambulatory Visit: Payer: Self-pay | Admitting: Gastroenterology

## 2018-12-16 ENCOUNTER — Encounter: Payer: Self-pay | Admitting: Gastroenterology

## 2018-12-19 ENCOUNTER — Other Ambulatory Visit: Payer: Self-pay | Admitting: Internal Medicine

## 2018-12-19 DIAGNOSIS — F411 Generalized anxiety disorder: Secondary | ICD-10-CM

## 2018-12-30 ENCOUNTER — Ambulatory Visit (INDEPENDENT_AMBULATORY_CARE_PROVIDER_SITE_OTHER): Payer: Medicare Other | Admitting: Internal Medicine

## 2018-12-30 ENCOUNTER — Encounter: Payer: Self-pay | Admitting: Internal Medicine

## 2018-12-30 ENCOUNTER — Ambulatory Visit (INDEPENDENT_AMBULATORY_CARE_PROVIDER_SITE_OTHER)
Admission: RE | Admit: 2018-12-30 | Discharge: 2018-12-30 | Disposition: A | Discharge status: Home / Self Care | Payer: Medicare Other | Source: Ambulatory Visit | Attending: Internal Medicine | Admitting: Internal Medicine

## 2018-12-30 ENCOUNTER — Other Ambulatory Visit: Payer: Self-pay | Admitting: Internal Medicine

## 2018-12-30 ENCOUNTER — Other Ambulatory Visit (INDEPENDENT_AMBULATORY_CARE_PROVIDER_SITE_OTHER): Payer: Medicare Other

## 2018-12-30 ENCOUNTER — Other Ambulatory Visit: Payer: Self-pay

## 2018-12-30 VITALS — BP 122/70 | HR 96 | Temp 97.4°F | Ht 65.0 in | Wt 174.2 lb

## 2018-12-30 DIAGNOSIS — E559 Vitamin D deficiency, unspecified: Secondary | ICD-10-CM

## 2018-12-30 DIAGNOSIS — E785 Hyperlipidemia, unspecified: Secondary | ICD-10-CM

## 2018-12-30 DIAGNOSIS — E039 Hypothyroidism, unspecified: Secondary | ICD-10-CM | POA: Diagnosis not present

## 2018-12-30 DIAGNOSIS — E538 Deficiency of other specified B group vitamins: Secondary | ICD-10-CM

## 2018-12-30 DIAGNOSIS — Z0000 Encounter for general adult medical examination without abnormal findings: Secondary | ICD-10-CM

## 2018-12-30 DIAGNOSIS — R739 Hyperglycemia, unspecified: Secondary | ICD-10-CM | POA: Diagnosis not present

## 2018-12-30 DIAGNOSIS — I739 Peripheral vascular disease, unspecified: Secondary | ICD-10-CM | POA: Insufficient documentation

## 2018-12-30 DIAGNOSIS — R0789 Other chest pain: Secondary | ICD-10-CM

## 2018-12-30 DIAGNOSIS — N183 Chronic kidney disease, stage 3 unspecified: Secondary | ICD-10-CM

## 2018-12-30 DIAGNOSIS — I10 Essential (primary) hypertension: Secondary | ICD-10-CM

## 2018-12-30 LAB — BASIC METABOLIC PANEL
BUN: 11 mg/dL (ref 6–23)
CO2: 29 mEq/L (ref 19–32)
Calcium: 9.6 mg/dL (ref 8.4–10.5)
Chloride: 104 mEq/L (ref 96–112)
Creatinine, Ser: 1.3 mg/dL — ABNORMAL HIGH (ref 0.40–1.20)
GFR: 48.45 mL/min — ABNORMAL LOW (ref >60.00)
Glucose, Bld: 107 mg/dL — ABNORMAL HIGH (ref 70–99)
Potassium: 4 mEq/L (ref 3.5–5.1)
Sodium: 140 mEq/L (ref 135–145)

## 2018-12-30 LAB — HEPATIC FUNCTION PANEL
ALT: 19 U/L (ref 0–35)
AST: 25 U/L (ref 0–37)
Albumin: 4.1 g/dL (ref 3.5–5.2)
Alkaline Phosphatase: 53 U/L (ref 39–117)
Bilirubin, Direct: 0.2 mg/dL (ref 0.0–0.3)
Total Bilirubin: 0.8 mg/dL (ref 0.2–1.2)
Total Protein: 7.4 g/dL (ref 6.0–8.3)

## 2018-12-30 LAB — LIPID PANEL
Cholesterol: 113 mg/dL (ref 0–200)
HDL: 64.5 mg/dL (ref >39.00)
LDL Cholesterol: 34 mg/dL (ref 0–99)
NonHDL: 48.11
Total CHOL/HDL Ratio: 2
Triglycerides: 71 mg/dL (ref 0.0–149.0)
VLDL: 14.2 mg/dL (ref 0.0–40.0)

## 2018-12-30 LAB — CBC WITH DIFFERENTIAL/PLATELET
Basophils Absolute: 0.1 10*3/uL (ref 0.0–0.1)
Basophils Relative: 1.2 % (ref 0.0–3.0)
Eosinophils Absolute: 0.4 10*3/uL (ref 0.0–0.7)
Eosinophils Relative: 7.7 % — ABNORMAL HIGH (ref 0.0–5.0)
HCT: 37.4 % (ref 36.0–46.0)
Hemoglobin: 12.8 g/dL (ref 12.0–15.0)
Lymphocytes Relative: 40.2 % (ref 12.0–46.0)
Lymphs Abs: 2.1 10*3/uL (ref 0.7–4.0)
MCHC: 34.3 g/dL (ref 30.0–36.0)
MCV: 79.2 fl (ref 78.0–100.0)
Monocytes Absolute: 0.6 10*3/uL (ref 0.1–1.0)
Monocytes Relative: 11.9 % (ref 3.0–12.0)
Neutro Abs: 2.1 10*3/uL (ref 1.4–7.7)
Neutrophils Relative %: 39 % — ABNORMAL LOW (ref 43.0–77.0)
Platelets: 200 10*3/uL (ref 150.0–400.0)
RBC: 4.72 Mil/uL (ref 3.87–5.11)
RDW: 14.2 % (ref 11.5–15.5)
WBC: 5.3 10*3/uL (ref 4.0–10.5)

## 2018-12-30 LAB — URINALYSIS, ROUTINE W REFLEX MICROSCOPIC
Bilirubin Urine: NEGATIVE
Ketones, ur: NEGATIVE
Nitrite: NEGATIVE
Specific Gravity, Urine: 1.02 (ref 1.000–1.030)
Total Protein, Urine: NEGATIVE
Urine Glucose: NEGATIVE
Urobilinogen, UA: 0.2 (ref 0.0–1.0)
pH: 5 (ref 5.0–8.0)

## 2018-12-30 LAB — VITAMIN D 25 HYDROXY (VIT D DEFICIENCY, FRACTURES): VITD: 92.49 ng/mL (ref 30.00–100.00)

## 2018-12-30 LAB — HEMOGLOBIN A1C: Hgb A1c MFr Bld: 5.6 % (ref 4.6–6.5)

## 2018-12-30 LAB — MICROALBUMIN / CREATININE URINE RATIO
Creatinine,U: 58.4 mg/dL
Microalb Creat Ratio: 1.2 mg/g (ref 0.0–30.0)
Microalb, Ur: <0.7 mg/dL (ref 0.0–1.9)

## 2018-12-30 LAB — FOLATE: Folate: 9.5 ng/mL (ref >5.9)

## 2018-12-30 LAB — TSH: TSH: 0.51 u[IU]/mL (ref 0.35–4.50)

## 2018-12-30 MED ORDER — CYANOCOBALAMIN 1000 MCG/ML IJ SOLN: INTRAMUSCULAR | 1 refills | Status: DC

## 2018-12-30 MED ORDER — ASPIRIN EC 81 MG PO TBEC: 81.0000 mg | DELAYED_RELEASE_TABLET | Freq: Every day | ORAL | 1 refills | Status: DC

## 2018-12-30 NOTE — Progress Notes (Signed)
Subjective:  Patient ID: Katie [name], female    DOB: 1945-10-06  Age: 73 y.o. MRN: [id number]  CC: Annual Exam, Diabetes, Hyperlipidemia, and Hypothyroidism   HPI Katie [name] presents for a CPX.  She complains that her feet feel cold.  She continues to complain of a strange sensation in her right lower extremity that has been thoroughly evaluated neurologically.  She also has a Morton's neuroma in her right foot that continues to bother her.  She has been seeing podiatry about this. She complains of a several week hx of right posterior chest pain.  Outpatient Medications Prior to Visit  Medication Sig Dispense Refill  . ACCU-CHEK AVIVA PLUS test strip     . ALPRAZolam (XANAX) 0.5 MG tablet TAKE 1 TABLET BY MOUTH 3  TIMES DAILY AS NEEDED FOR  ANXIETY OR SLEEP 270 tablet 0  . famotidine (PEPCID) 20 MG tablet TAKE 2 TABLETS BY MOUTH EVERY DAY 180 tablet 0  . furosemide (LASIX) 20 MG tablet Take 1 tablet by mouth once daily 90 tablet 0  . Lancet Devices (SIMPLE DIAGNOSTICS LANCING DEV) MISC     . levothyroxine (SYNTHROID, LEVOTHROID) 88 MCG tablet Take 88 mcg by mouth daily.      Marland Kitchen lisinopril (PRINIVIL,ZESTRIL) 10 MG tablet Take 1 tablet by mouth once daily 90 tablet 1  . lubiprostone (AMITIZA) 24 MCG capsule Take 1 capsule (24 mcg total) by mouth 2 (two) times daily with a meal. 60 capsule 3  . rosuvastatin (CRESTOR) 10 MG tablet Take 10 mg by mouth daily.    Marland Kitchen aspirin EC 81 MG tablet Take 81 mg by mouth daily.    . Biotin (BIOTIN 5000) 5 MG CAPS Take 5 mg by mouth daily.    . cyanocobalamin (,VITAMIN B-12,) 1000 MCG/ML injection INJECT 1000 MCG INTRAMUSCULARLY ONCE EVERY MONTH 3 mL 0  . ergocalciferol (VITAMIN D2) 50000 UNITS capsule Take 50,000 Units by mouth every Saturday.     . famotidine (PEPCID) 40 MG tablet TAKE 1 TABLET(40 MG) BY MOUTH DAILY 30 tablet 3   No facility-administered medications prior to visit.     ROS Review of Systems  Constitutional: Negative.  Negative for  diaphoresis, fatigue and unexpected weight change.  HENT: Negative.  Negative for sore throat and trouble swallowing.   Eyes: Negative.   Respiratory: Negative for cough, chest tightness, shortness of breath and wheezing.   Cardiovascular: Positive for chest pain. Negative for palpitations and leg swelling.  Gastrointestinal: Positive for constipation. Negative for abdominal pain, blood in stool, diarrhea, nausea and vomiting.  Endocrine: Negative.  Negative for cold intolerance and heat intolerance.  Genitourinary: Negative.   Musculoskeletal: Negative.  Negative for arthralgias and myalgias.  Skin: Negative.  Negative for color change and pallor.  Neurological: Negative.  Negative for dizziness, weakness, light-headedness and headaches.  Hematological: Negative for adenopathy. Does not bruise/bleed easily.  Psychiatric/Behavioral: Positive for sleep disturbance. Negative for confusion, decreased concentration, dysphoric mood and suicidal ideas. The patient is nervous/anxious.     Objective:  BP 122/70 (BP Location: Left Arm, Patient Position: Sitting, Cuff Size: Normal)   Pulse 96   Temp (!) 97.4 F (36.3 C) (Oral)   Ht 5\' 5"  (1.651 m)   Wt 174 lb 4 oz (79 kg)   SpO2 99%   BMI 29.00 kg/m   BP Readings from Last 3 Encounters:  12/30/18 122/70  07/23/18 128/72  07/01/18 136/86    Wt Readings from Last 3 Encounters:  12/30/18 174 lb 4 oz (  79 kg)  07/23/18 179 lb 1.9 oz (81.2 kg)  07/01/18 180 lb (81.6 kg)    Physical Exam Vitals signs reviewed.  Constitutional:      General: She is not in acute distress.    Appearance: She is not ill-appearing, toxic-appearing or diaphoretic.  HENT:     Nose: Nose normal.     Mouth/Throat:     Mouth: Mucous membranes are moist.     Pharynx: No posterior oropharyngeal erythema.  Eyes:     General: No scleral icterus.    Conjunctiva/sclera: Conjunctivae normal.  Neck:     Musculoskeletal: Normal range of motion. No neck rigidity or  muscular tenderness.  Cardiovascular:     Rate and Rhythm: Normal rate and regular rhythm.     Heart sounds: No murmur.  Pulmonary:     Effort: Pulmonary effort is normal. No respiratory distress.     Breath sounds: Normal breath sounds. No stridor. No wheezing, rhonchi or rales.  Chest:     Chest wall: No mass, deformity, swelling, tenderness or edema.  Abdominal:     General: Abdomen is flat. There is no distension.     Palpations: There is no hepatomegaly, splenomegaly or mass.     Tenderness: There is no abdominal tenderness.  Genitourinary:    Comments: Breast, GU, rectal exams were deferred at her request. Musculoskeletal: Normal range of motion.        General: No swelling.     Right lower leg: No edema.     Left lower leg: No edema.  Lymphadenopathy:     Cervical: No cervical adenopathy.  Skin:    General: Skin is warm and dry.     Coloration: Skin is not pale.  Neurological:     General: No focal deficit present.     Mental Status: She is alert and oriented to person, place, and time. Mental status is at baseline.  Psychiatric:        Mood and Affect: Mood normal.        Behavior: Behavior normal.        Thought Content: Thought content normal.        Judgment: Judgment normal.     Lab Results  Component Value Date   WBC 5.3 12/30/2018   HGB 12.8 12/30/2018   HCT 37.4 12/30/2018   PLT 200.0 12/30/2018   GLUCOSE 107 (H) 12/30/2018   CHOL 113 12/30/2018   TRIG 71.0 12/30/2018   HDL 64.50 12/30/2018   LDLDIRECT 105.9 07/04/2007   LDLCALC 34 12/30/2018   ALT 19 12/30/2018   AST 25 12/30/2018   NA 140 12/30/2018   K 4.0 12/30/2018   CL 104 12/30/2018   CREATININE 1.30 (H) 12/30/2018   BUN 11 12/30/2018   CO2 29 12/30/2018   TSH 0.51 12/30/2018   HGBA1C 5.6 12/30/2018   MICROALBUR <0.7 12/30/2018    No results found.  Assessment & Plan:   Katie [name] was seen today for annual exam, diabetes, hyperlipidemia and hypothyroidism.  Diagnoses and all orders  for this visit:  Essential hypertension- Her blood pressure is adequately well controlled. -     CBC with Differential/Platelet; Future -     Urinalysis, Routine w reflex microscopic; Future  Acquired hypothyroidism- Her TSH is in the normal range.  She remain on the current dose of levothyroxine. -     TSH; Future  Chronic renal disease, stage 3, moderately decreased glomerular filtration rate (GFR) between 30-59 mL/min/1.73 square meter ([place])- Her renal function  is stable.  She will avoid nephrotoxic agents. -     CBC with Differential/Platelet; Future -     Basic metabolic panel; Future -     Urinalysis, Routine w reflex microscopic; Future -     Microalbumin / creatinine urine ratio; Future  B12 deficiency -     Folate; Future -     cyanocobalamin (,VITAMIN B-12,) 1000 MCG/ML injection; INJECT 1 ML (CC) ONCE EVERY MONTH  Hyperglycemia- Her A1c is at 5.6%.  Medical therapy is not indicated. -     Hemoglobin A1c; Future  Hyperlipidemia with target LDL less than 100- She has achieved her LDL goal and is doing well on the statin. -     Lipid panel; Future -     Hepatic function panel; Future  Vitamin D deficiency -     VITAMIN D 25 Hydroxy (Vit-D Deficiency, Fractures); Future  PAD (peripheral artery disease) ([place])- I am concerned the cold sensation in her feet may be a sign of PAD.  I have asked her to undergo ABIs to confirm the presence or absence of PAD. -     VAS [name] ABI WITH/WO TBI; Future -     aspirin EC 81 MG tablet; Take 1 tablet (81 mg total) by mouth daily.  Right-sided chest wall pain- She has discomfort but no other symptoms.  Exam is normal, plain films are normal.  This is likely musculoskeletal chest wall pain.  I offered her reassurance. -     DG Chest 2 View; Future  Routine general medical examination at a health care facility- Exam completed, labs reviewed, vaccines reviewed, Pap is not indicated, mammogram is up-to-date, colon cancer screening is up-to-date,  patient education was given.   I have discontinued Katie [name]'s ergocalciferol and Biotin. I have also changed her aspirin EC. Additionally, I am having her maintain her levothyroxine, Accu-Chek Aviva Plus, [employer] [place] Dev, rosuvastatin, lisinopril, lubiprostone, furosemide, famotidine, ALPRAZolam, and cyanocobalamin.  Meds ordered this encounter  Medications  . aspirin EC 81 MG tablet    Sig: Take 1 tablet (81 mg total) by mouth daily.    Dispense:  90 tablet    Refill:  1  . cyanocobalamin (,VITAMIN B-12,) 1000 MCG/ML injection    Sig: INJECT 1 ML (CC) ONCE EVERY MONTH    Dispense:  3 mL    Refill:  1     Follow-up: Return in about 4 months (around 05/01/2019).  Scarlette Calico, MD

## 2018-12-30 NOTE — Patient Instructions (Signed)
Preventive Care 38 Years and Older, Female Preventive care refers to lifestyle choices and visits with your health care provider that can promote health and wellness. This includes:  A yearly physical exam. This is also called an annual well check.  Regular dental and eye exams.  Immunizations.  Screening for certain conditions.  Healthy lifestyle choices, such as diet and exercise. What can I expect for my preventive care visit? Physical exam Your health care provider will check:  Height and weight. These may be used to calculate body mass index (BMI), which is a measurement that tells if you are at a healthy weight.  Heart rate and blood pressure.  Your skin for abnormal spots. Counseling Your health care provider may ask you questions about:  Alcohol, tobacco, and drug use.  Emotional well-being.  Home and relationship well-being.  Sexual activity.  Eating habits.  History of falls.  Memory and ability to understand (cognition).  Work and work Statistician.  Pregnancy and menstrual history. What immunizations do I need?  Influenza (flu) vaccine  This is recommended every year. Tetanus, diphtheria, and pertussis (Tdap) vaccine  You may need a Td booster every 10 years. Varicella (chickenpox) vaccine  You may need this vaccine if you have not already been vaccinated. Zoster (shingles) vaccine  You may need this after age 33. Pneumococcal conjugate (PCV13) vaccine  One dose is recommended after age 33. Pneumococcal polysaccharide (PPSV23) vaccine  One dose is recommended after age 72. Measles, mumps, and rubella (MMR) vaccine  You may need at least one dose of MMR if you were born in 1957 or later. You may also need a second dose. Meningococcal conjugate (MenACWY) vaccine  You may need this if you have certain conditions. Hepatitis A vaccine  You may need this if you have certain conditions or if you travel or work in places where you may be exposed  to hepatitis A. Hepatitis B vaccine  You may need this if you have certain conditions or if you travel or work in places where you may be exposed to hepatitis B. Haemophilus influenzae type b (Hib) vaccine  You may need this if you have certain conditions. You may receive vaccines as individual doses or as more than one vaccine together in one shot (combination vaccines). Talk with your health care provider about the risks and benefits of combination vaccines. What tests do I need? Blood tests  Lipid and cholesterol levels. These may be checked every 5 years, or more frequently depending on your overall health.  Hepatitis C test.  Hepatitis B test. Screening  Lung cancer screening. You may have this screening every year starting at age 39 if you have a 30-pack-year history of smoking and currently smoke or have quit within the past 15 years.  Colorectal cancer screening. All adults should have this screening starting at age 36 and continuing until age 15. Your health care provider may recommend screening at age 23 if you are at increased risk. You will have tests every 1-10 years, depending on your results and the type of screening test.  Diabetes screening. This is done by checking your blood sugar (glucose) after you have not eaten for a while (fasting). You may have this done every 1-3 years.  Mammogram. This may be done every 1-2 years. Talk with your health care provider about how often you should have regular mammograms.  BRCA-related cancer screening. This may be done if you have a family history of breast, ovarian, tubal, or peritoneal cancers.  Other tests  Sexually transmitted disease (STD) testing.  Bone density scan. This is done to screen for osteoporosis. You may have this done starting at age 76. Follow these instructions at home: Eating and drinking  Eat a diet that includes fresh fruits and vegetables, whole grains, lean protein, and low-fat dairy products. Limit  your intake of foods with high amounts of sugar, saturated fats, and salt.  Take vitamin and mineral supplements as recommended by your health care provider.  Do not drink alcohol if your health care provider tells you not to drink.  If you drink alcohol: ? Limit how much you have to 0-1 drink a day. ? Be aware of how much alcohol is in your drink. In the [place] one drink equals one 12 oz bottle of beer (355 mL), one 5 oz glass of wine (148 mL), or one 1 oz glass of hard liquor (44 mL). Lifestyle  Take daily care of your teeth and gums.  Stay active. Exercise for at least 30 minutes on 5 or more days each week.  Do not use any products that contain nicotine or tobacco, such as cigarettes, e-cigarettes, and chewing tobacco. If you need help quitting, ask your health care provider.  If you are sexually active, practice safe sex. Use a condom or other form of protection in order to prevent STIs (sexually transmitted infections).  Talk with your health care provider about taking a low-dose aspirin or statin. What's next?  Go to your health care provider once a year for a well check visit.  Ask your health care provider how often you should have your eyes and teeth checked.  Stay up to date on all vaccines. This information is not intended to replace advice given to you by your health care provider. Make sure you discuss any questions you have with your health care provider. Document Released: 05/28/2015 Document Revised: 04/25/2018 Document Reviewed: 04/25/2018 Elsevier Patient Education  2020 [employer].

## 2018-12-31 ENCOUNTER — Encounter: Payer: Self-pay | Admitting: Internal Medicine

## 2018-12-31 DIAGNOSIS — Z0000 Encounter for general adult medical examination without abnormal findings: Secondary | ICD-10-CM | POA: Insufficient documentation

## 2019-01-01 ENCOUNTER — Other Ambulatory Visit: Payer: Self-pay | Admitting: Internal Medicine

## 2019-01-01 ENCOUNTER — Other Ambulatory Visit: Payer: Self-pay

## 2019-01-01 ENCOUNTER — Ambulatory Visit (HOSPITAL_COMMUNITY)
Admission: RE | Admit: 2019-01-01 | Discharge: 2019-01-01 | Disposition: A | Discharge status: Home / Self Care | Payer: Medicare Other | Source: Ambulatory Visit | Attending: Internal Medicine | Admitting: Internal Medicine

## 2019-01-01 DIAGNOSIS — I739 Peripheral vascular disease, unspecified: Secondary | ICD-10-CM | POA: Diagnosis not present

## 2019-01-01 DIAGNOSIS — I10 Essential (primary) hypertension: Secondary | ICD-10-CM

## 2019-01-08 ENCOUNTER — Other Ambulatory Visit: Payer: Self-pay | Admitting: Internal Medicine

## 2019-01-08 DIAGNOSIS — I10 Essential (primary) hypertension: Secondary | ICD-10-CM

## 2019-01-15 ENCOUNTER — Ambulatory Visit (AMBULATORY_SURGERY_CENTER): Payer: Self-pay | Admitting: *Deleted

## 2019-01-15 ENCOUNTER — Other Ambulatory Visit: Payer: Self-pay

## 2019-01-15 VITALS — Temp 96.9°F | Ht 65.0 in | Wt 178.8 lb

## 2019-01-15 DIAGNOSIS — Z86010 Personal history of colonic polyps: Secondary | ICD-10-CM

## 2019-01-15 MED ORDER — NA SULFATE-K SULFATE-MG SULF 17.5-3.13-1.6 GM/177ML PO SOLN: ORAL | 0 refills | Status: DC

## 2019-01-15 NOTE — Progress Notes (Signed)
Patient is here in-person for PV. Patient denies any allergies to eggs or soy. Patient denies any problems with anesthesia/sedation. Patient denies any oxygen use at home. Patient denies taking any diet/weight loss medications or blood thinners. EMMI education assisgned to patient on colonoscopy, this was explained and instructions given to patient.Pt is aware that care partner will wait in the car during procedure; if they feel like they will be too hot to wait in the car; they may wait in the lobby.  We want them to wear a mask (we do not have any that we can provide them), practice social distancing, and we will check their temperatures when they get here.  I did remind patient that their care partner needs to stay in the parking lot the entire time. Pt will wear mask into building.

## 2019-01-16 ENCOUNTER — Encounter: Payer: Self-pay | Admitting: Gastroenterology

## 2019-01-28 ENCOUNTER — Telehealth: Payer: Self-pay

## 2019-01-28 NOTE — Telephone Encounter (Signed)
Returned call and answered "NO" to all screening questions.

## 2019-01-28 NOTE — Telephone Encounter (Signed)
Covid-19 screening questions   Do you now or have you had a fever in the last 14 days?  Do you have any respiratory symptoms of shortness of breath or cough now or in the last 14 days?  Do you have any family members or close contacts with diagnosed or suspected Covid-19 in the past 14 days?  Have you been tested for Covid-19 and found to be positive?       

## 2019-01-29 ENCOUNTER — Encounter: Payer: Self-pay | Admitting: Gastroenterology

## 2019-01-29 ENCOUNTER — Other Ambulatory Visit: Payer: Self-pay | Admitting: Gastroenterology

## 2019-01-29 ENCOUNTER — Ambulatory Visit (AMBULATORY_SURGERY_CENTER): Payer: Medicare Other | Admitting: Gastroenterology

## 2019-01-29 ENCOUNTER — Other Ambulatory Visit: Payer: Self-pay

## 2019-01-29 VITALS — BP 143/78 | HR 78 | Temp 98.0°F | Resp 14

## 2019-01-29 DIAGNOSIS — D123 Benign neoplasm of transverse colon: Secondary | ICD-10-CM

## 2019-01-29 DIAGNOSIS — Z86010 Personal history of colonic polyps: Secondary | ICD-10-CM

## 2019-01-29 LAB — HM COLONOSCOPY

## 2019-01-29 MED ORDER — SODIUM CHLORIDE 0.9 % IV SOLN: 500.0000 mL | Freq: Once | INTRAVENOUS | Status: DC

## 2019-01-29 NOTE — Progress Notes (Signed)
Called to room to assist during endoscopic procedure.  Patient ID and intended procedure confirmed with present staff. Received instructions for my participation in the procedure from the performing physician.  

## 2019-01-29 NOTE — Patient Instructions (Signed)
HANDOUTS given for polyps and hemorrhoids.  YOU HAD AN ENDOSCOPIC PROCEDURE TODAY AT [facility]:   Refer to the procedure report that was given to you for any specific questions about what was found during the examination.  If the procedure report does not answer your questions, please call your gastroenterologist to clarify.  If you requested that your care partner not be given the details of your procedure findings, then the procedure report has been included in a sealed envelope for you to review at your convenience later.  YOU SHOULD EXPECT: Some feelings of bloating in the abdomen. Passage of more gas than usual.  Walking can help get rid of the air that was put into your GI tract during the procedure and reduce the bloating. If you had a lower endoscopy (such as a colonoscopy or flexible sigmoidoscopy) you may notice spotting of blood in your stool or on the toilet paper. If you underwent a bowel prep for your procedure, you may not have a normal bowel movement for a few days.  Please Note:  You might notice some irritation and congestion in your nose or some drainage.  This is from the oxygen used during your procedure.  There is no need for concern and it should clear up in a day or so.  SYMPTOMS TO REPORT IMMEDIATELY:   Following lower endoscopy (colonoscopy or flexible sigmoidoscopy):  Excessive amounts of blood in the stool  Significant tenderness or worsening of abdominal pains  Swelling of the abdomen that is new, acute  Fever of 100F or higher  For urgent or emergent issues, a gastroenterologist can be reached at any hour by calling [phone].   DIET:  We do recommend a small meal at first, but then you may proceed to your regular diet.  Drink plenty of fluids but you should avoid alcoholic beverages for 24 hours.  ACTIVITY:  You should plan to take it easy for the rest of today and you should NOT DRIVE or use heavy machinery until tomorrow (because of the  sedation medicines used during the test).    FOLLOW UP: Our staff will call the number listed on your records 48-72 hours following your procedure to check on you and address any questions or concerns that you may have regarding the information given to you following your procedure. If we do not reach you, we will leave a message.  We will attempt to reach you two times.  During this call, we will ask if you have developed any symptoms of COVID 19. If you develop any symptoms (ie: fever, flu-like symptoms, shortness of breath, cough etc.) before then, please call [phone].  If you test positive for Covid 19 in the 2 weeks post procedure, please call and report this information to [name].    If any biopsies were taken you will be contacted by phone or by letter within the next 1-3 weeks.  Please call us at [phone] if you have not heard about the biopsies in 3 weeks.    SIGNATURES/CONFIDENTIALITY: You and/or your care partner have signed paperwork which will be entered into your electronic medical record.  These signatures attest to the fact that that the information above on your After Visit Summary has been reviewed and is understood.  Full responsibility of the confidentiality of this discharge information lies with you and/or your care-partner.

## 2019-01-29 NOTE — Op Note (Signed)
[facility] Patient Name: Katie [name] Procedure Date: 01/29/2019 9:25 AM MRN: [id number] Endoscopist: Ladene Artist , MD Age: 73 Referring MD:  Date of Birth: Dec 27, 1945 Gender: Female Account #: [id number] Procedure:                Colonoscopy Indications:              Surveillance: Personal history of adenomatous                            polyps on last colonoscopy 5 years ago Medicines:                Monitored Anesthesia Care Procedure:                Pre-Anesthesia Assessment:                           - Prior to the procedure, a History and Physical                            was performed, and patient medications and                            allergies were reviewed. The patient's tolerance of                            previous anesthesia was also reviewed. The risks                            and benefits of the procedure and the sedation                            options and risks were discussed with the patient.                            All questions were answered, and informed consent                            was obtained. Prior Anticoagulants: The patient has                            taken no previous anticoagulant or antiplatelet                            agents. ASA Grade Assessment: II - A patient with                            mild systemic disease. After reviewing the risks                            and benefits, the patient was deemed in                            satisfactory condition to undergo the procedure.  After obtaining informed consent, the colonoscope                            was passed under direct vision. Throughout the                            procedure, the patient's blood pressure, pulse, and                            oxygen saturations were monitored continuously. The                            Colonoscope was introduced through the anus and                            advanced to the the cecum,  identified by                            appendiceal orifice and ileocecal valve. The                            ileocecal valve, appendiceal orifice, and rectum                            were photographed. The quality of the bowel                            preparation was good. The colonoscopy was performed                            without difficulty. The patient tolerated the                            procedure well. Scope In: 9:37:51 AM Scope Out: 9:51:43 AM Scope Withdrawal Time: 0 hours 9 minutes 3 seconds  Total Procedure Duration: 0 hours 13 minutes 52 seconds  Findings:                 The perianal and digital rectal examinations were                            normal.                           A 5 mm polyp was found in the transverse colon. The                            polyp was sessile. The polyp was removed with a                            cold snare. Resection and retrieval were complete.                           Internal hemorrhoids were found during  retroflexion. The hemorrhoids were moderate and                            Grade I (internal hemorrhoids that do not prolapse).                           The exam was otherwise without abnormality on                            direct and retroflexion views. Complications:            No immediate complications. Estimated blood loss:                            None. Estimated Blood Loss:     Estimated blood loss: none. Impression:               - One 5 mm polyp in the transverse colon, removed                            with a cold snare. Resected and retrieved.                           - Internal hemorrhoids.                           - The examination was otherwise normal on direct                            and retroflexion views. Recommendation:           - Repeat colonoscopy in 7 years for surveillance.                           - Patient has a contact number available for                             emergencies. The signs and symptoms of potential                            delayed complications were discussed with the                            patient. Return to normal activities tomorrow.                            Written discharge instructions were provided to the                            patient.                           - Resume previous diet.                           - Continue present medications.                           -  Await pathology results. Ladene Artist, MD 01/29/2019 9:54:32 AM This report has been signed electronically.

## 2019-01-29 NOTE — Progress Notes (Signed)
Pt's states no medical or surgical changes since previsit or office visit. 

## 2019-01-29 NOTE — Progress Notes (Signed)
Report to PACU, RN, vss, BBS= Clear.  

## 2019-01-30 LAB — HM COLONOSCOPY

## 2019-01-31 ENCOUNTER — Telehealth: Payer: Self-pay

## 2019-01-31 ENCOUNTER — Telehealth: Payer: Self-pay | Admitting: *Deleted

## 2019-01-31 NOTE — Telephone Encounter (Signed)
Attempted to reach patient for post-procedure f/u call. No answer. Left message that we will make another attempt to reach her again later today and for her to please not hesitate to call us if she has any questions/concerns.

## 2019-01-31 NOTE — Telephone Encounter (Signed)
  Follow up Call-  Call back number 01/29/2019 03/07/2018  Post procedure Call Back phone  # [phone]  Permission to leave phone message Yes Yes  Some recent data might be hidden     Patient questions:  Do you have a fever, pain , or abdominal swelling? No. Pain Score  0 *  Have you tolerated food without any problems? Yes.    Have you been able to return to your normal activities? Yes.    Do you have any questions about your discharge instructions: Diet   No. Medications  No. Follow up visit  No.  Do you have questions or concerns about your Care? No.  Actions: * If pain score is 4 or above: No action needed, pain <4.  1. Have you developed a fever since your procedure? no  2.   Have you had an respiratory symptoms (SOB or cough) since your procedure? no  3.   Have you tested positive for COVID 19 since your procedure no  4.   Have you had any family members/close contacts diagnosed with the COVID 19 since your procedure?  no   If yes to any of these questions please route to Joylene John, RN and Alphonsa Gin, Therapist, sports.

## 2019-02-03 ENCOUNTER — Encounter: Payer: Self-pay | Admitting: Gastroenterology

## 2019-03-22 ENCOUNTER — Other Ambulatory Visit: Payer: Self-pay | Admitting: Internal Medicine

## 2019-03-22 DIAGNOSIS — F411 Generalized anxiety disorder: Secondary | ICD-10-CM

## 2019-03-23 ENCOUNTER — Other Ambulatory Visit: Payer: Self-pay | Admitting: Gastroenterology

## 2019-03-24 ENCOUNTER — Other Ambulatory Visit: Payer: Self-pay | Admitting: Internal Medicine

## 2019-03-24 DIAGNOSIS — I10 Essential (primary) hypertension: Secondary | ICD-10-CM

## 2019-03-24 MED ORDER — FUROSEMIDE 20 MG PO TABS: 20.0000 mg | ORAL_TABLET | Freq: Every day | ORAL | 1 refills | Status: DC

## 2019-03-31 ENCOUNTER — Other Ambulatory Visit: Payer: Self-pay | Admitting: Ophthalmology

## 2019-04-01 ENCOUNTER — Ambulatory Visit: Payer: Medicare Other

## 2019-05-01 ENCOUNTER — Encounter: Payer: Self-pay | Admitting: Internal Medicine

## 2019-05-01 ENCOUNTER — Other Ambulatory Visit: Payer: Self-pay

## 2019-05-01 ENCOUNTER — Other Ambulatory Visit (INDEPENDENT_AMBULATORY_CARE_PROVIDER_SITE_OTHER): Payer: Medicare Other

## 2019-05-01 ENCOUNTER — Ambulatory Visit (INDEPENDENT_AMBULATORY_CARE_PROVIDER_SITE_OTHER): Payer: Medicare Other | Admitting: Internal Medicine

## 2019-05-01 VITALS — BP 122/78 | HR 72 | Temp 98.0°F | Resp 16 | Ht 65.0 in | Wt 177.0 lb

## 2019-05-01 DIAGNOSIS — E538 Deficiency of other specified B group vitamins: Secondary | ICD-10-CM

## 2019-05-01 DIAGNOSIS — I10 Essential (primary) hypertension: Secondary | ICD-10-CM

## 2019-05-01 DIAGNOSIS — R739 Hyperglycemia, unspecified: Secondary | ICD-10-CM

## 2019-05-01 DIAGNOSIS — M48062 Spinal stenosis, lumbar region with neurogenic claudication: Secondary | ICD-10-CM

## 2019-05-01 DIAGNOSIS — G5711 Meralgia paresthetica, right lower limb: Secondary | ICD-10-CM

## 2019-05-01 DIAGNOSIS — N1832 Chronic kidney disease, stage 3b: Secondary | ICD-10-CM

## 2019-05-01 DIAGNOSIS — E039 Hypothyroidism, unspecified: Secondary | ICD-10-CM

## 2019-05-01 LAB — HEMOGLOBIN A1C: Hgb A1c MFr Bld: 5.9 % (ref 4.6–6.5)

## 2019-05-01 LAB — CBC WITH DIFFERENTIAL/PLATELET
Basophils Absolute: 0.1 10*3/uL (ref 0.0–0.1)
Basophils Relative: 1.8 % (ref 0.0–3.0)
Eosinophils Absolute: 0.3 10*3/uL (ref 0.0–0.7)
Eosinophils Relative: 6.3 % — ABNORMAL HIGH (ref 0.0–5.0)
HCT: 38.1 % (ref 36.0–46.0)
Hemoglobin: 12.6 g/dL (ref 12.0–15.0)
Lymphocytes Relative: 42.7 % (ref 12.0–46.0)
Lymphs Abs: 2 10*3/uL (ref 0.7–4.0)
MCHC: 33.2 g/dL (ref 30.0–36.0)
MCV: 75.8 fl — ABNORMAL LOW (ref 78.0–100.0)
Monocytes Absolute: 0.5 10*3/uL (ref 0.1–1.0)
Monocytes Relative: 10.3 % (ref 3.0–12.0)
Neutro Abs: 1.9 10*3/uL (ref 1.4–7.7)
Neutrophils Relative %: 38.9 % — ABNORMAL LOW (ref 43.0–77.0)
Platelets: 233 10*3/uL (ref 150.0–400.0)
RBC: 5.03 Mil/uL (ref 3.87–5.11)
RDW: 15.7 % — ABNORMAL HIGH (ref 11.5–15.5)
WBC: 4.8 10*3/uL (ref 4.0–10.5)

## 2019-05-01 LAB — BASIC METABOLIC PANEL
BUN: 14 mg/dL (ref 6–23)
CO2: 30 mEq/L (ref 19–32)
Calcium: 9.6 mg/dL (ref 8.4–10.5)
Chloride: 104 mEq/L (ref 96–112)
Creatinine, Ser: 1.36 mg/dL — ABNORMAL HIGH (ref 0.40–1.20)
GFR: 45.95 mL/min — ABNORMAL LOW (ref >60.00)
Glucose, Bld: 114 mg/dL — ABNORMAL HIGH (ref 70–99)
Potassium: 4.6 mEq/L (ref 3.5–5.1)
Sodium: 140 mEq/L (ref 135–145)

## 2019-05-01 LAB — TSH: TSH: 1.55 u[IU]/mL (ref 0.35–4.50)

## 2019-05-01 MED ORDER — GABAPENTIN 100 MG PO CAPS: 100.0000 mg | ORAL_CAPSULE | Freq: Three times a day (TID) | ORAL | 5 refills | Status: DC

## 2019-05-01 NOTE — Patient Instructions (Signed)

## 2019-05-01 NOTE — Progress Notes (Signed)
Subjective:  Patient ID: Katie [name], female    DOB: 12-16-1945  Age: 73 y.o. MRN: [id number]  CC: Hypertension and Hypothyroidism  This visit occurred during the SARS-CoV-2 public health emergency.  Safety protocols were in place, including screening questions prior to the visit, additional usage of staff PPE, and extensive cleaning of exam room while observing appropriate contact time as indicated for disinfecting solutions.    HPI Sharlet Notaro presents for f/up - She continues to complain of pain in her right thigh.  She has had this intermittently for a few years.  She previously did physical therapy to treat meralgia paresthetica but she has not been doing the exercises recently.  She was also previously, successfully treated with gabapentin which she is no longer taking.  She otherwise feels well and offers no other complaints.  Outpatient Medications Prior to Visit  Medication Sig Dispense Refill  . ACCU-CHEK AVIVA PLUS test strip     . ALPRAZolam (XANAX) 0.5 MG tablet TAKE 1 TABLET BY MOUTH 3  TIMES DAILY AS NEEDED FOR  ANXIETY OR SLEEP. 270 tablet 0  . aspirin EC 81 MG tablet Take 1 tablet (81 mg total) by mouth daily. 90 tablet 1  . BIOTIN 5000 PO Take by mouth.    . cyanocobalamin (,VITAMIN B-12,) 1000 MCG/ML injection INJECT 1 ML (CC) ONCE EVERY MONTH 3 mL 1  . Dulaglutide (TRULICITY) 1.5 WU/9.8JX SOPN Inject into the skin every 14 (fourteen) days.    . Ergocalciferol (VITAMIN D2 PO) Take by mouth.    . famotidine (PEPCID) 20 MG tablet TAKE 2 TABLETS BY MOUTH EVERY DAY 180 tablet 1  . furosemide (LASIX) 20 MG tablet Take 1 tablet (20 mg total) by mouth daily. 90 tablet 1  . [employer] Devices (SIMPLE DIAGNOSTICS LANCING DEV) MISC     . levothyroxine (SYNTHROID, LEVOTHROID) 88 MCG tablet Take 88 mcg by mouth daily.      . rosuvastatin (CRESTOR) 10 MG tablet Take 10 mg by mouth daily.    Marland Kitchen lisinopril (ZESTRIL) 10 MG tablet Take 1 tablet by mouth once daily 90 tablet 1  . VITAMIN E PO  Take by mouth.     No facility-administered medications prior to visit.    ROS Review of Systems  Constitutional: Negative for diaphoresis, fatigue and unexpected weight change.  HENT: Negative.   Eyes: Negative for visual disturbance.  Respiratory: Negative for cough, chest tightness, shortness of breath and wheezing.   Cardiovascular: Negative for palpitations and leg swelling.  Gastrointestinal: Negative for abdominal pain, constipation, diarrhea, nausea and vomiting.  Endocrine: Negative.  Negative for cold intolerance and heat intolerance.  Genitourinary: Negative.  Negative for difficulty urinating.  Musculoskeletal: Positive for arthralgias, back pain and myalgias.       Right thigh pain  Skin: Negative.  Negative for color change and rash.  Neurological: Negative.  Negative for dizziness, weakness and numbness.  Hematological: Negative for adenopathy. Does not bruise/bleed easily.  Psychiatric/Behavioral: Negative.     Objective:  BP 122/78 (BP Location: Left Arm, Patient Position: Sitting, Cuff Size: Large)   Pulse 72   Temp 98 F (36.7 C) (Oral)   Resp 16   Ht 5\' 5"  (1.651 m)   Wt 177 lb (80.3 kg)   SpO2 97%   BMI 29.45 kg/m   BP Readings from Last 3 Encounters:  05/01/19 122/78  01/29/19 (!) 143/78  12/30/18 122/70    Wt Readings from Last 3 Encounters:  05/01/19 177 lb (80.3 kg)  01/15/19 178 lb 12.8 oz (81.1 kg)  12/30/18 174 lb 4 oz (79 kg)    Physical Exam Vitals reviewed.  HENT:     Nose: Nose normal.     Mouth/Throat:     Mouth: Mucous membranes are moist.  Eyes:     General: No scleral icterus.    Conjunctiva/sclera: Conjunctivae normal.  Cardiovascular:     Rate and Rhythm: Normal rate and regular rhythm.     Heart sounds: No murmur.  Pulmonary:     Effort: Pulmonary effort is normal.     Breath sounds: No stridor. No wheezing, rhonchi or rales.  Abdominal:     General: Abdomen is flat. Bowel sounds are normal. There is no distension.       Palpations: Abdomen is soft. There is no hepatomegaly, splenomegaly or mass.     Tenderness: There is no abdominal tenderness.  Musculoskeletal:        General: Normal range of motion.     Cervical back: Neck supple.     Lumbar back: Normal. No tenderness. Negative right straight leg raise test and negative left straight leg raise test.     Right hip: Normal. No deformity.     Right upper leg: Normal. No swelling, edema, deformity or tenderness.     Right lower leg: No edema.     Left lower leg: No edema.  Lymphadenopathy:     Cervical: No cervical adenopathy.  Skin:    General: Skin is warm and dry.     Coloration: Skin is not pale.  Neurological:     General: No focal deficit present.     Lab Results  Component Value Date   WBC 4.8 05/01/2019   HGB 12.6 05/01/2019   HCT 38.1 05/01/2019   PLT 233.0 05/01/2019   GLUCOSE 114 (H) 05/01/2019   CHOL 113 12/30/2018   TRIG 71.0 12/30/2018   HDL 64.50 12/30/2018   LDLDIRECT 105.9 07/04/2007   LDLCALC 34 12/30/2018   ALT 19 12/30/2018   AST 25 12/30/2018   NA 140 05/01/2019   K 4.6 05/01/2019   CL 104 05/01/2019   CREATININE 1.36 (H) 05/01/2019   BUN 14 05/01/2019   CO2 30 05/01/2019   TSH 1.55 05/01/2019   HGBA1C 5.9 05/01/2019   MICROALBUR <0.7 12/30/2018    VAS [name] ABI WITH/WO TBI  Result Date: 01/01/2019 LOWER EXTREMITY DOPPLER STUDY Indications: Peripheral artery disease. High Risk Factors: Hypertension, hyperlipidemia, Diabetes, no history of                    smoking.  Performing Technologist: Burley Saver RVT  Examination Guidelines: A complete evaluation includes at minimum, Doppler waveform signals and systolic blood pressure reading at the level of bilateral brachial, anterior tibial, and posterior tibial arteries, when vessel segments are accessible. Bilateral testing is considered an integral part of a complete examination. Photoelectric Plethysmograph (PPG) waveforms and toe systolic pressure readings are  included as required and additional duplex testing as needed. Limited examinations for reoccurring indications may be performed as noted.  ABI Findings: +---------+------------------+-----+---------+--------+ Right    Rt Pressure (mmHg)IndexWaveform Comment  +---------+------------------+-----+---------+--------+ Brachial 131                                      +---------+------------------+-----+---------+--------+ PTA      149  1.14 triphasic         +---------+------------------+-----+---------+--------+ DP       160               1.22 biphasic          +---------+------------------+-----+---------+--------+ Great Toe78                0.60 Abnormal          +---------+------------------+-----+---------+--------+ +---------+------------------+-----+---------+-------+ Left     Lt Pressure (mmHg)IndexWaveform Comment +---------+------------------+-----+---------+-------+ Brachial 131                                     +---------+------------------+-----+---------+-------+ PTA      152               1.16 triphasic        +---------+------------------+-----+---------+-------+ DP       160               1.22 triphasic        +---------+------------------+-----+---------+-------+ Great Toe66                0.50 Abnormal         +---------+------------------+-----+---------+-------+ +-------+-----------+-----------+------------+------------+ ABI/TBIToday's ABIToday's TBIPrevious ABIPrevious TBI +-------+-----------+-----------+------------+------------+ Right  1.22       0.60                                +-------+-----------+-----------+------------+------------+ Left   1.22       0.50                                +-------+-----------+-----------+------------+------------+ No previous study done at this location available for comparison.  Summary: Right: Resting right ankle-brachial index is within normal range. No evidence  of significant right lower extremity arterial disease. The right toe-brachial index is abnormal. RT great toe pressure = 78 mmHg. PPG tracings appear dampened. Left: Resting left ankle-brachial index is within normal range. No evidence of significant left lower extremity arterial disease. The left toe-brachial index is abnormal. LT Great toe pressure = 66 mmHg. PPG tracings appear dampened.  *See table(s) above for measurements and observations.  Electronically signed by Deitra Mayo MD on 01/01/2019 at 2:40:21 PM.    Final     Assessment & Plan:   Geisha was seen today for hypertension and hypothyroidism.  Diagnoses and all orders for this visit:  Essential hypertension- Her blood pressure is well controlled.  Electrolytes and renal function are normal. -     Basic metabolic panel; Future  Stage 3b chronic kidney disease- her renal function is stable.  She agrees to avoid nephrotoxic agents. -     CBC with Differential; Future -     Basic metabolic panel; Future  Acquired hypothyroidism- Her TSH is in the normal range.  She will stay on the current dose of levothyroxine. -     TSH; Future  B12 deficiency -     CBC with Differential; Future  Hyperglycemia -     HM Diabetes Foot Exam -     Hemoglobin A1c; Future  Spinal stenosis, lumbar region with neurogenic claudication -     gabapentin (NEURONTIN) 100 MG capsule; Take 1 capsule (100 mg total) by mouth 3 (three) times daily.  Meralgia paresthetica, right- I have asked her to restart gabapentin to  reduce the discomfort and to restart her on home exercises. -     gabapentin (NEURONTIN) 100 MG capsule; Take 1 capsule (100 mg total) by mouth 3 (three) times daily.   I have discontinued Venora Bonadonna's lisinopril and VITAMIN E PO. I am also having her start on gabapentin. Additionally, I am having her maintain her levothyroxine, Accu-Chek Aviva Plus, [employer] Lancing Dev, rosuvastatin, aspirin EC, cyanocobalamin, Trulicity,  BIOTIN 5638 PO, Ergocalciferol (VITAMIN D2 PO), ALPRAZolam, famotidine, and furosemide.  Meds ordered this encounter  Medications  . gabapentin (NEURONTIN) 100 MG capsule    Sig: Take 1 capsule (100 mg total) by mouth 3 (three) times daily.    Dispense:  90 capsule    Refill:  5     Follow-up: Return in about 6 months (around 10/30/2019).  Scarlette Calico, MD

## 2019-06-13 ENCOUNTER — Ambulatory Visit: Payer: Medicare Other

## 2019-06-21 ENCOUNTER — Ambulatory Visit: Payer: Medicare Other | Attending: Internal Medicine

## 2019-06-21 DIAGNOSIS — Z23 Encounter for immunization: Secondary | ICD-10-CM | POA: Insufficient documentation

## 2019-06-21 NOTE — Progress Notes (Signed)
   Covid-19 Vaccination Clinic  Name:  Katie [name]    MRN: [id number] DOB: [birth year]  06/21/2019  [name] was observed post Covid-19 immunization for 15 minutes without incidence. She was provided with Vaccine Information Sheet and instruction to access the V-Safe system.   [name] was instructed to call 911 with any severe reactions post vaccine: Marland Kitchen Difficulty breathing  . Swelling of your face and throat  . A fast heartbeat  . A bad rash all over your body  . Dizziness and weakness    Immunizations Administered    Name Date Dose VIS Date Route   Pfizer COVID-19 Vaccine 06/21/2019  4:57 PM 0.3 mL 04/25/2019 Intramuscular   Manufacturer: [place]   Lot: [id number]   [place]: [id number]

## 2019-06-26 ENCOUNTER — Other Ambulatory Visit: Payer: Self-pay | Admitting: Internal Medicine

## 2019-06-26 DIAGNOSIS — F411 Generalized anxiety disorder: Secondary | ICD-10-CM

## 2019-06-30 ENCOUNTER — Telehealth: Payer: Self-pay | Admitting: Gastroenterology

## 2019-06-30 NOTE — Telephone Encounter (Signed)
Patient reports bloating after her meals. She will come in and see Alonza Bogus, PA on 07/08/19 1:30.  She is asked to contact her PCP about her "off balance" issues.

## 2019-06-30 NOTE — Telephone Encounter (Signed)
Patient is called states she has been experiencing a lot of bloating and the medications are not helping also states that she feels off balance sometimes when she gets up.

## 2019-07-02 ENCOUNTER — Other Ambulatory Visit: Payer: Self-pay | Admitting: Internal Medicine

## 2019-07-02 DIAGNOSIS — E538 Deficiency of other specified B group vitamins: Secondary | ICD-10-CM

## 2019-07-08 ENCOUNTER — Other Ambulatory Visit (INDEPENDENT_AMBULATORY_CARE_PROVIDER_SITE_OTHER): Payer: Medicare Other

## 2019-07-08 ENCOUNTER — Ambulatory Visit: Payer: Medicare Other | Admitting: Gastroenterology

## 2019-07-08 VITALS — BP 128/68 | HR 100 | Temp 97.9°F | Ht 65.0 in | Wt 178.2 lb

## 2019-07-08 DIAGNOSIS — R140 Abdominal distension (gaseous): Secondary | ICD-10-CM | POA: Diagnosis not present

## 2019-07-08 DIAGNOSIS — R109 Unspecified abdominal pain: Secondary | ICD-10-CM | POA: Diagnosis not present

## 2019-07-08 DIAGNOSIS — K2270 Barrett's esophagus without dysplasia: Secondary | ICD-10-CM

## 2019-07-08 DIAGNOSIS — R1013 Epigastric pain: Secondary | ICD-10-CM

## 2019-07-08 DIAGNOSIS — K5900 Constipation, unspecified: Secondary | ICD-10-CM | POA: Diagnosis not present

## 2019-07-08 DIAGNOSIS — D3A8 Other benign neuroendocrine tumors: Secondary | ICD-10-CM

## 2019-07-08 LAB — BASIC METABOLIC PANEL
BUN: 21 mg/dL (ref 6–23)
CO2: 30 mEq/L (ref 19–32)
Calcium: 9.9 mg/dL (ref 8.4–10.5)
Chloride: 103 mEq/L (ref 96–112)
Creatinine, Ser: 1.61 mg/dL — ABNORMAL HIGH (ref 0.40–1.20)
GFR: 37.8 mL/min — ABNORMAL LOW (ref >60.00)
Glucose, Bld: 121 mg/dL — ABNORMAL HIGH (ref 70–99)
Potassium: 4.5 mEq/L (ref 3.5–5.1)
Sodium: 138 mEq/L (ref 135–145)

## 2019-07-08 MED ORDER — LINACLOTIDE 290 MCG PO CAPS: 290.0000 ug | ORAL_CAPSULE | Freq: Every day | ORAL | 1 refills | Status: DC

## 2019-07-08 NOTE — Patient Instructions (Addendum)
You have been scheduled for a CT scan of the abdomen and pelvis at [facility] ([address]---this is in the same building as [employer]).   You are scheduled on 07/17/2019  at 9:30am. You should arrive 15 minutes prior to your appointment time for registration. Please follow the written instructions below on the day of your exam:  WARNING: IF YOU ARE ALLERGIC TO IODINE/X-RAY DYE, PLEASE NOTIFY RADIOLOGY IMMEDIATELY AT [phone]! YOU WILL BE GIVEN A 13 HOUR PREMEDICATION PREP.  1) Do not eat or drink anything after 5:30am (4 hours prior to your test) 2) You have been given 2 bottles of oral contrast to drink. The solution may taste better if refrigerated, but do NOT add ice or any other liquid to this solution. Shake well before drinking.    Drink 1 bottle of contrast @ 7:30am (2 hours prior to your exam)  Drink 1 bottle of contrast @ 8:30am (1 hour prior to your exam)  You may take any medications as prescribed with a small amount of water, if necessary. If you take any of the following medications: METFORMIN, GLUCOPHAGE, GLUCOVANCE, AVANDAMET, RIOMET, FORTAMET, [place] MET, JANUMET, GLUMETZA or METAGLIP, you MAY be asked to HOLD this medication 48 hours AFTER the exam.  The purpose of you drinking the oral contrast is to aid in the visualization of your intestinal tract. The contrast solution may cause some diarrhea. Depending on your individual set of symptoms, you may also receive an intravenous injection of x-ray contrast/dye. Plan on being at [facility] for 30 minutes or longer, depending on the type of exam you are having performed.  This test typically takes 30-45 minutes to complete.  If you have any questions regarding your exam or if you need to reschedule, you may call the CT department at [phone] between the hours of 8:00 am and 5:00 pm, Monday-Friday.  ________________________________________________________________________ Go to the  basement for labs  We have sent Linzess to your pharmacy  If you are age 81 or older, your body mass index should be between 23-30. Your Body mass index is 29.65 kg/m. If this is out of the aforementioned range listed, please consider follow up with your Primary Care Provider.  If you are age 64 or younger, your body mass index should be between 19-25. Your Body mass index is 29.65 kg/m. If this is out of the aformentioned range listed, please consider follow up with your Primary Care Provider.

## 2019-07-08 NOTE — Progress Notes (Signed)
07/08/2019 Katie [name] [id number] Oct 29, 1945   HISTORY OF PRESENT ILLNESS: This is a pleasant 74 year old female who is known to Dr. Fuller Plan.  She has had ongoing complaints of epigastric abdominal pain, early satiety, and constipation and presents here today with the same complaints.  In regards to the constipation she tells me she has tried MiraLAX in the past.  Brown caused her to feel dizzy.  From previous notes she reported that Linzess 290 mcg daily did not seem to help and MiraLAX was added, but she tells me today that she had received samples more recently of the Linzess 290 mcg from her PCP, Dr. Ronnald Ramp, and it maybe seemed to help.  She continues to say that she does not eat a lot, which is usual for her, but even when she eats small amount she gets bloating and discomfort in her upper abdomen, like a pressure sensation.  Previous evaluation:  She underwent EGD on 03/06/2018 which showed proximal gastritis, normal esophagus, there is a 6 mm sessile polyp in the gastric body and diffuse atrophic mucosa of the gastric fundus.  Also had a 6 mm AVM in the gastric body .  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   Biopsies of the esophagus were consistent with Barrett's with no dysplasia.  Endoscopic ultrasound in December 2019 by Dr. Rush Landmark, which showed the following:  - Esophageal mucosal changes consistent with short-segment Barrett's esophagus.  Previously biopsied. No other gross lesions in esophagus. - A single non-bleeding angiodysplastic lesion in the stomach as previously noted. - Two gastric polyps vs NETs. Resected and retrieved. - Scar in the gastric body (greater curvature). Mucosal resection performed of the scar site to ensure negative margins. Clips (MR conditional) were placed to close the defect. - No other gross lesions in the stomach. Gastric mapping biopsies performed. - No gross lesions in the duodenal  bulb, in the first portion of the duodenum and in the second portion of the duodenum. EUS Impression: - No malignant-appearing lymph nodes were visualized in the left gastric region (level 17), gastrohepatic ligament (level 18) and celiac region (level 20). - No evidence of intramural mass-lesions noted on EUS within the stomach.  1. Stomach, polyp(s), lesser curve - HYPERPLASTIC POLYPS WITH FOCAL INTESTINAL METAPLASIA. - NO DYSPLASIA OR MALIGNANCY. 2. Stomach, biopsy, EMR  - CHRONIC GASTRITIS WITH INTESTINAL METAPLASIA. - NO DYSPLASIA OR MALIGNANCY. 3. Stomach, biopsy, antrum - CHRONIC GASTRITIS WITH FOCAL INTESTINAL METAPLASIA AND REACTIVE CHANGES. - NO DYSPLASIA OR MALIGNANCY. 4. Stomach, biopsy, incisura - CHRONIC GASTRITIS. - NO INTESTINAL METAPLASIA, DYSPLASIA, OR MALIGNANCY. 5. Stomach, biopsy, greater curve - CHRONIC GASTRITIS WITH INTESTINAL METAPLASIA. - NO DYSPLASIA OR MALIGNANCY. 6. Stomach, biopsy, lesser curve - CHRONIC GASTRITIS WITH INTESTINAL METAPLASIA. - NO DYSPLASIA OR MALIGNANCY. 7. Stomach, biopsy, fundus - CHRONIC GASTRITIS WITH INTESTINAL METAPLASIA. - NO DYSPLASIA OR MALIGNANCY. 8. Stomach, biopsy, cardia - CHRONIC GASTRITIS WITH INTESTINAL METAPLASIA. - NO DYSPLASIA OR MALIGNANCY.   Colonoscopy September 2020 by Dr. Fuller Plan showed one 5 mm polyp and internal hemorrhoids.  Polyp was a tubular adenoma.  Recall 7 years.    Past Medical History:  Diagnosis Date  . Acute venous embolism and thrombosis of unspecified deep vessels of lower extremity 2001   RLE DVT and PE, coumadin x 87mo, now ASSA  . Anxiety state, unspecified   . Chronic idiopathic constipation 11/02/2016  . Degeneration of cervical intervertebral disc   . Gallstones   .  History of DVT (deep vein thrombosis)   . Iron deficiency anemia secondary to blood loss (chronic)   . Irritable bowel syndrome   . Lumbago   . Other B-complex deficiencies   . Personal history of colonic polyps     adenomatous  . Pure hypercholesterolemia   . Reflux esophagitis   . Tubular adenoma of colon 09/2002  . Type II or unspecified type diabetes mellitus without mention of complication, not stated as uncontrolled   . Unspecified essential hypertension   . Unspecified hypothyroidism   . Unspecified iridocyclitis   . Unspecified venous (peripheral) insufficiency   . Unspecified vitamin D deficiency    Past Surgical History:  Procedure Laterality Date  . BIOPSY  04/24/2018   Procedure: BIOPSY;  Surgeon: Rush Landmark Telford Nab., MD;  Location: [facility];  Service: Gastroenterology;;  . CARPAL TUNNEL RELEASE    . CATARACT EXTRACTION Bilateral 08/29/2017,09/19/17  . ESOPHAGOGASTRODUODENOSCOPY (EGD) WITH PROPOFOL N/A 04/24/2018   Procedure: ESOPHAGOGASTRODUODENOSCOPY (EGD) WITH PROPOFOL;  Surgeon: Rush Landmark Telford Nab., MD;  Location: [facility];  Service: Gastroenterology;  Laterality: N/A;  with EMR  . FEMORAL HERNIA REPAIR    . OOPHORECTOMY    . POLYPECTOMY  04/24/2018   Procedure: POLYPECTOMY;  Surgeon: Mansouraty, Telford Nab., MD;  Location: [facility];  Service: Gastroenterology;;  . UPPER ESOPHAGEAL ENDOSCOPIC ULTRASOUND (EUS)  04/24/2018   Procedure: UPPER ESOPHAGEAL ENDOSCOPIC ULTRASOUND (EUS);  Surgeon: Rush Landmark Telford Nab., MD;  Location: [facility];  Service: Gastroenterology;;  . VESICOVAGINAL FISTULA CLOSURE W/ TAH      reports that she has never smoked. She has never used smokeless tobacco. She reports current alcohol use. She reports that she does not use drugs. family history includes Deep vein thrombosis in her mother; Diabetes in her mother; Healthy in her sister; Hypertension in her mother; Prostate cancer in her father. Allergies  Allergen Reactions  . Lisinopril Other (See Comments)    Cough and throat clearing  . Metoprolol Succinate     Unknown reaction  . Penicillins Swelling    Has patient had a PCN reaction causing immediate rash, facial/tongue/throat  swelling, SOB or lightheadedness with hypotension: Yes Has patient had a PCN reaction causing severe rash involving mucus membranes or skin necrosis: No Has patient had a PCN reaction that required hospitalization: No Has patient had a PCN reaction occurring within the last 10 years: No If all of the above answers are "NO", then may proceed with Cephalosporin use.   . Sulfamethoxazole   . Sulfamethoxazole-Trimethoprim     Unknown reaction  . Trimethoprim   . Metformin Nausea Only      Outpatient Encounter Medications as of 07/08/2019  Medication Sig  . ACCU-CHEK AVIVA PLUS test strip   . ALPRAZolam (XANAX) 0.5 MG tablet TAKE 1 TABLET BY MOUTH 3  TIMES DAILY AS NEEDED FOR  ANXIETY OR SLEEP  . aspirin EC 81 MG tablet Take 1 tablet (81 mg total) by mouth daily.  Marland Kitchen BIOTIN 5000 PO Take by mouth.  . cyanocobalamin (,VITAMIN B-12,) 1000 MCG/ML injection INJECT 1 MILLILITERS INTRAMUSCULARLY  ONCE EVERY MONTH  . Dulaglutide (TRULICITY) 1.5 KK/9.3GH SOPN Inject into the skin every 14 (fourteen) days.  . Ergocalciferol (VITAMIN D2 PO) Take by mouth.  . famotidine (PEPCID) 20 MG tablet TAKE 2 TABLETS BY MOUTH EVERY DAY  . furosemide (LASIX) 20 MG tablet Take 1 tablet (20 mg total) by mouth daily.  Marland Kitchen gabapentin (NEURONTIN) 100 MG capsule Take 1 capsule (100 mg total) by mouth 3 (three)  times daily.  [place] Devices (SIMPLE DIAGNOSTICS LANCING DEV) MISC   . levothyroxine (SYNTHROID, LEVOTHROID) 88 MCG tablet Take 88 mcg by mouth daily.    Marland Kitchen lisinopril (ZESTRIL) 10 MG tablet Take 10 mg by mouth daily.  . rosuvastatin (CRESTOR) 10 MG tablet Take 10 mg by mouth daily.   No facility-administered encounter medications on file as of 07/08/2019.     REVIEW OF SYSTEMS  : All other systems reviewed and negative except where noted in the History of Present Illness.   PHYSICAL EXAM: BP 128/68   Pulse 100   Temp 97.9 F (36.6 C)   Ht 5\' 5"  (1.651 m)   Wt 178 lb 3.2 oz (80.8 kg)   BMI 29.65 kg/m    General: Well developed AA female in no acute distress Head: Normocephalic and atraumatic Eyes:  Sclerae anicteric, conjunctiva pink. Ears: Normal auditory acuity Lungs: Clear throughout to auscultation; no increased WOB. Heart: Regular rate and rhythm; no M/R/G. Abdomen: Soft, non-distended.  BS present.  Minimal diffuse TTP. Musculoskeletal: Symmetrical with no gross deformities  Skin: No lesions on visible extremities Extremities: No edema  Neurological: Alert oriented x 4, grossly non-focal Psychological:  Alert and cooperative. Normal mood and affect  ASSESSMENT AND PLAN: *Epigastric abdominal pain, generalized abdominal bloating and pressure:  Occurs after eating even just very small amounts of food. *Constipation:  Linzess 290 mcg daily.  Did well with this previously.  Says that amitiza made her dizzy.  Prescription sent to her pharmacy.  She will give [name] an update as to how it seems to be working for her once again. 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  Follow-up EUS 04/2018 confirmed no further evidence.  Repeat EGD in 2-3 years. *Barrett's esophagus-no dysplasia, will need repeat EGD in 2-3 years.  On Pepcid 20 mg BID.  Continue.  -Will plan for CT scan of the abdomen and pelvis with contrast as she has not had any cross-sectional imaging.  Check BMP today.  CC:  Janith Lima, MD

## 2019-07-10 ENCOUNTER — Other Ambulatory Visit: Payer: Self-pay

## 2019-07-10 DIAGNOSIS — R109 Unspecified abdominal pain: Secondary | ICD-10-CM

## 2019-07-10 DIAGNOSIS — R140 Abdominal distension (gaseous): Secondary | ICD-10-CM

## 2019-07-10 DIAGNOSIS — R1013 Epigastric pain: Secondary | ICD-10-CM

## 2019-07-10 DIAGNOSIS — K5900 Constipation, unspecified: Secondary | ICD-10-CM

## 2019-07-16 ENCOUNTER — Ambulatory Visit: Payer: Medicare Other | Attending: Internal Medicine

## 2019-07-16 DIAGNOSIS — Z23 Encounter for immunization: Secondary | ICD-10-CM | POA: Insufficient documentation

## 2019-07-16 NOTE — Progress Notes (Signed)
   Covid-19 Vaccination Clinic  Name:  Katie [name]    MRN: [id number] DOB: 24-Aug-1945  07/16/2019  [name] was observed post Covid-19 immunization for 15 minutes without incident. She was provided with Vaccine Information Sheet and instruction to access the V-Safe system.   [name] was instructed to call 911 with any severe reactions post vaccine: Marland Kitchen Difficulty breathing  . Swelling of face and throat  . A fast heartbeat  . A bad rash all over body  . Dizziness and weakness   Immunizations Administered    Name Date Dose VIS Date Route   Pfizer COVID-19 Vaccine 07/16/2019  1:38 PM 0.3 mL 04/25/2019 Intramuscular   Manufacturer: [place]   Lot: [id number]   [place]: [id number]

## 2019-07-16 NOTE — Progress Notes (Signed)
   Covid-19 Vaccination Clinic  Name:  Katie [name]    MRN: [id number] DOB: Jan 01, 1946  07/16/2019  [name] was observed post Covid-19 immunization for 15 minutes without incident. She was provided with Vaccine Information Sheet and instruction to access the V-Safe system.   [name] was instructed to call 911 with any severe reactions post vaccine: Marland Kitchen Difficulty breathing  . Swelling of face and throat  . A fast heartbeat  . A bad rash all over body  . Dizziness and weakness   Immunizations Administered    Name Date Dose VIS Date Route   Pfizer COVID-19 Vaccine 07/16/2019  1:38 PM 0.3 mL 04/25/2019 Intramuscular   Manufacturer: [place]   Lot: [id number]   [place]: [id number]

## 2019-07-17 ENCOUNTER — Inpatient Hospital Stay: Admission: RE | Admit: 2019-07-17 | Payer: Medicare Other | Source: Ambulatory Visit

## 2019-07-20 ENCOUNTER — Other Ambulatory Visit: Payer: Self-pay | Admitting: Internal Medicine

## 2019-07-23 ENCOUNTER — Ambulatory Visit (INDEPENDENT_AMBULATORY_CARE_PROVIDER_SITE_OTHER)
Admission: RE | Admit: 2019-07-23 | Discharge: 2019-07-23 | Disposition: A | Discharge status: Home / Self Care | Payer: Medicare Other | Source: Ambulatory Visit | Attending: Gastroenterology | Admitting: Gastroenterology

## 2019-07-23 ENCOUNTER — Inpatient Hospital Stay: Admission: RE | Admit: 2019-07-23 | Payer: Medicare Other | Source: Ambulatory Visit

## 2019-07-23 ENCOUNTER — Other Ambulatory Visit: Payer: Medicare Other

## 2019-07-23 ENCOUNTER — Other Ambulatory Visit: Payer: Self-pay

## 2019-07-23 DIAGNOSIS — K5900 Constipation, unspecified: Secondary | ICD-10-CM | POA: Diagnosis not present

## 2019-07-23 DIAGNOSIS — R109 Unspecified abdominal pain: Secondary | ICD-10-CM

## 2019-07-23 DIAGNOSIS — R1013 Epigastric pain: Secondary | ICD-10-CM | POA: Diagnosis not present

## 2019-07-23 DIAGNOSIS — R140 Abdominal distension (gaseous): Secondary | ICD-10-CM

## 2019-07-23 MED ORDER — IOHEXOL 300 MG/ML  SOLN
80.0000 mL | Freq: Once | INTRAMUSCULAR | Status: AC | PRN
  Administered 2019-07-23: 80 mL via INTRAVENOUS

## 2019-07-26 ENCOUNTER — Encounter: Payer: Self-pay | Admitting: Gastroenterology

## 2019-07-26 DIAGNOSIS — R140 Abdominal distension (gaseous): Secondary | ICD-10-CM | POA: Insufficient documentation

## 2019-07-26 DIAGNOSIS — R109 Unspecified abdominal pain: Secondary | ICD-10-CM | POA: Insufficient documentation

## 2019-07-26 DIAGNOSIS — R1013 Epigastric pain: Secondary | ICD-10-CM | POA: Insufficient documentation

## 2019-07-26 DIAGNOSIS — D3A8 Other benign neuroendocrine tumors: Secondary | ICD-10-CM | POA: Insufficient documentation

## 2019-07-28 NOTE — Progress Notes (Signed)
Reviewed and agree with management plan.  Bernadetta Roell T. Aeson Sawyers, MD FACG [facility]  

## 2019-08-05 ENCOUNTER — Encounter: Payer: Self-pay | Admitting: Internal Medicine

## 2019-08-05 ENCOUNTER — Ambulatory Visit: Payer: Medicare Other | Admitting: Internal Medicine

## 2019-08-05 ENCOUNTER — Other Ambulatory Visit: Payer: Self-pay

## 2019-08-05 VITALS — BP 134/74 | HR 90 | Temp 98.8°F | Resp 16 | Ht 65.0 in | Wt 178.0 lb

## 2019-08-05 DIAGNOSIS — L940 Localized scleroderma [morphea]: Secondary | ICD-10-CM | POA: Insufficient documentation

## 2019-08-05 NOTE — Patient Instructions (Signed)
Scleroderma Scleroderma is a rare and long-term (chronic) disease of the immune system. The immune system protects the body by attacking germs that cause illness. If you have scleroderma, your immune system mistakenly attacks your skin and other parts of your body instead. This is called an autoimmune disease. Scleroderma means hardening of the skin. If you have a mild form of this condition, it may affect only your skin (localized scleroderma). If you have a severe form, it may affect your skin and also your blood vessels, lungs, kidneys, heart, and digestive system (systemic scleroderma). What are the causes? The cause of this condition is not known. What increases the risk? You are more likely to develop this condition if:  You have a family history of scleroderma.  You are female.  You are 54-6 years old. What are the signs or symptoms? Symptoms of this condition depend on the type of scleroderma you have. They also vary from person to person. Symptoms of localized scleroderma may include:  Discolored patches of skin (morphea). These may be thick and waxy.  Bands of thick, hard skin on your arms, legs, or face (linear scleroderma).  Tightening of the skin that limits how well you can move your joints.  Open skin sores. Symptoms of systemic scleroderma may include:  Discoloration of the fingers and sometimes the toes (Raynaud's phenomenon). Your fingers or toes may turn blue, white, or red. You may also have tingling or numbness. Exposure to cold often triggers this symptom.  Tightening of the skin of the fingers, hands, arms, neck, and face.  Enlarged blood vessels of the hands, face, and nail beds (telangiectasias).  Calcium deposits under your skin (calcinosis).  Joint pain.  Heartburn.  High blood pressure.  Constipation.  Trouble swallowing (dysphagia).  Trouble breathing. How is this diagnosed? This condition may be diagnosed based on:  Your symptoms and  medical history.  A physical exam.  Tests, such as: ? Imaging studies. This may include X-rays and CT scan. ? Blood tests. ? Lung (pulmonary) function tests. Scleroderma can be hard to diagnose because other diseases have many of the same symptoms. You may need to see specialists as directed by your health care provider. How is this treated? There is no cure for this condition, but treatment can relieve symptoms and prevent complications. Mild symptoms may not need treatment. Treatment may include taking medicines to:  Improve blood flow.  Block production of stomach acid to treat heartburn.  Treat high blood pressure caused by kidney disease.  Relieve joint pain and inflammation.  Treat lung symptoms. Follow these instructions at home:  Medicines  Take over-the-counter and prescription medicines only as told by your health care provider.  Do not drive or use heavy machinery while taking prescription pain medicine. Eating and drinking  Eat a healthy diet.  Drink enough fluid to keep your urine pale yellow.  If you have heartburn: ? Eat smaller meals often. ? Avoid spicy and fatty foods. ? Do not eat meals late in the evening. ? Avoid lying down right after you eat. Lifestyle   If you have Raynaud's phenomenon, cold can trigger your symptoms. To prevent symptoms, you can: ? Wear mittens, a hat, a scarf, and warm footwear. ? Dress in layers during cold weather. ? If possible, stay indoors during cold weather.  Wear comfortable shoes that are well cushioned.  Protect your skin with sunscreen and moisturizers.  Stretch and exercise regularly.  Maintain a healthy weight. General instructions  Learn as much  as you can about scleroderma, and work closely with your team of health care providers.  Do not use any products that contain nicotine or tobacco, such as cigarettes and e-cigarettes. If you need help quitting, ask your health care provider.  Ask your health  care provider how to check your blood pressure at home. Check it as directed by your health care provider.  Make sure you have a good support system at home.  Use skin moisturizer to keep your skin moist.  Keep all follow-up visits as told by your health care provider. This is important. Contact a health care provider if:  Your scleroderma symptoms change or become worse.  You have concerns about your mental health. Get help right away if:  A finger or toe becomes painful or numb.  A finger or toe turns black or a very dark color.  You have: ? Swelling, pain, or tenderness in an arm or leg. ? Trouble swallowing. ? Trouble breathing. ? Chest pain.  You cough up blood. Summary  Scleroderma is a rare and long-term (chronic) disease of the immune system. If you have scleroderma, your immune system mistakenly attacks your skin and other parts of your body.  Symptoms of this condition depend on the type of scleroderma you have. They also vary from person to person.  There is no cure for this condition, but treatment can relieve symptoms and prevent complications. This information is not intended to replace advice given to you by your health care provider. Make sure you discuss any questions you have with your health care provider. Document Revised: 06/08/2017 Document Reviewed: 06/08/2017 Elsevier Patient Education  2020 [employer].

## 2019-08-05 NOTE — Progress Notes (Signed)
Subjective:  Patient ID: Katie [name], female    DOB: 1946/01/23  Age: 74 y.o. MRN: [id number]  CC: Rash   HPI [name] presents for concerns about a rash.  She has had an asymptomatic rash around her bellybutton and on her back for the last year.  She has not treated it.  Outpatient Medications Prior to Visit  Medication Sig Dispense Refill  . ACCU-CHEK AVIVA PLUS test strip     . ALPRAZolam (XANAX) 0.5 MG tablet TAKE 1 TABLET BY MOUTH 3  TIMES DAILY AS NEEDED FOR  ANXIETY OR SLEEP 270 tablet 1  . aspirin EC 81 MG tablet Take 1 tablet (81 mg total) by mouth daily. 90 tablet 1  . BIOTIN 5000 PO Take by mouth.    . cyanocobalamin (,VITAMIN B-12,) 1000 MCG/ML injection INJECT 1 MILLILITERS INTRAMUSCULARLY  ONCE EVERY MONTH 3 mL 0  . Dulaglutide (TRULICITY) 1.5 FX/9.0WI SOPN Inject into the skin every 14 (fourteen) days.    . Ergocalciferol (VITAMIN D2 PO) Take by mouth.    . famotidine (PEPCID) 20 MG tablet TAKE 2 TABLETS BY MOUTH EVERY DAY 180 tablet 1  . furosemide (LASIX) 20 MG tablet Take 1 tablet (20 mg total) by mouth daily. 90 tablet 1  . gabapentin (NEURONTIN) 100 MG capsule Take 1 capsule (100 mg total) by mouth 3 (three) times daily. 90 capsule 5  . [employer] (SIMPLE DIAGNOSTICS LANCING DEV) MISC     . levothyroxine (SYNTHROID, LEVOTHROID) 88 MCG tablet Take 88 mcg by mouth daily.      Marland Kitchen linaclotide (LINZESS) 290 MCG CAPS capsule Take 1 capsule (290 mcg total) by mouth daily before breakfast. 30 capsule 1  . lisinopril (ZESTRIL) 10 MG tablet Take 1 tablet by mouth once daily 90 tablet 0  . rosuvastatin (CRESTOR) 10 MG tablet Take 10 mg by mouth daily.     No facility-administered medications prior to visit.    ROS Review of Systems  Constitutional: Negative for diaphoresis and fatigue.  HENT: Negative.   Eyes: Negative for visual disturbance.  Respiratory: Negative for cough, shortness of breath and wheezing.   Cardiovascular: Negative for palpitations and  leg swelling.  Gastrointestinal: Negative for abdominal pain, constipation, diarrhea, nausea and vomiting.  Endocrine: Negative.   Genitourinary: Negative.  Negative for difficulty urinating.  Musculoskeletal: Negative.  Negative for arthralgias.  Skin: Positive for color change and rash.  Neurological: Negative for dizziness, weakness and light-headedness.  Hematological: Negative for adenopathy. Does not bruise/bleed easily.    Objective:  BP 134/74 (BP Location: Left Arm, Patient Position: Sitting, Cuff Size: Large)   Pulse 90   Temp 98.8 F (37.1 C) (Oral)   Resp 16   Ht 5\' 5"  (1.651 m)   Wt 178 lb (80.7 kg)   SpO2 97%   BMI 29.62 kg/m   BP Readings from Last 3 Encounters:  08/05/19 134/74  07/08/19 128/68  05/01/19 122/78    Wt Readings from Last 3 Encounters:  08/05/19 178 lb (80.7 kg)  07/08/19 178 lb 3.2 oz (80.8 kg)  05/01/19 177 lb (80.3 kg)    Physical Exam Vitals reviewed.  Constitutional:      Appearance: Normal appearance.  HENT:     Nose: Nose normal.     Mouth/Throat:     Mouth: Mucous membranes are moist.  Eyes:     General: No scleral icterus.    Conjunctiva/sclera: Conjunctivae normal.  Cardiovascular:     Rate and Rhythm:  Normal rate and regular rhythm.     Heart sounds: No murmur.  Pulmonary:     Effort: Pulmonary effort is normal.     Breath sounds: No stridor. No wheezing, rhonchi or rales.  Abdominal:     General: Abdomen is flat.     Palpations: There is no mass.     Tenderness: There is no abdominal tenderness. There is no guarding.  Musculoskeletal:     Cervical back: Neck supple.  Lymphadenopathy:     Cervical: No cervical adenopathy.  Skin:    General: Skin is warm.     Findings: Rash present.     Comments: In the midline the posteriorly and on the umbilicus there are patches of hypopigmented and hyperpigmented epidermis.  The one on the back has a slightly dark ring.  There are no vesicles, scales, or excoriations. See  photos.  Neurological:     General: No focal deficit present.     Mental Status: She is alert.     Lab Results  Component Value Date   WBC 4.8 05/01/2019   HGB 12.6 05/01/2019   HCT 38.1 05/01/2019   PLT 233.0 05/01/2019   GLUCOSE 121 (H) 07/08/2019   CHOL 113 12/30/2018   TRIG 71.0 12/30/2018   HDL 64.50 12/30/2018   LDLDIRECT 105.9 07/04/2007   LDLCALC 34 12/30/2018   ALT 19 12/30/2018   AST 25 12/30/2018   NA 138 07/08/2019   K 4.5 07/08/2019   CL 103 07/08/2019   CREATININE 1.61 (H) 07/08/2019   BUN 21 07/08/2019   CO2 30 07/08/2019   TSH 1.55 05/01/2019   HGBA1C 5.9 05/01/2019   MICROALBUR <0.7 12/30/2018    CT Abdomen Pelvis W Contrast  Result Date: 07/23/2019 CLINICAL DATA:  Worsening abdominal pain. Early satiety. Chronic constipation. EXAM: CT ABDOMEN AND PELVIS WITH CONTRAST TECHNIQUE: Multidetector CT imaging of the abdomen and pelvis was performed using the standard protocol following bolus administration of intravenous contrast. CONTRAST:  9mL OMNIPAQUE IOHEXOL 300 MG/ML  SOLN COMPARISON:  CT abdomen 06/06/2011 FINDINGS: Lower chest: Lung bases are clear. Hepatobiliary: No focal hepatic lesion. Multiple gallstones noted. No gallbladder inflammation. Pancreas: Pancreas is normal. No ductal dilatation. No pancreatic inflammation. Spleen: Normal spleen Adrenals/urinary tract: Adrenal glands and kidneys are normal. The ureters and bladder normal. Stomach/Bowel: Stomach, small bowel, appendix, and cecum are normal. The colon and rectosigmoid colon are normal. Vascular/Lymphatic: Abdominal aorta is normal caliber. No periportal or retroperitoneal adenopathy. No pelvic adenopathy. Reproductive: Post hysterectomy Other: No free fluid. Musculoskeletal: No aggressive osseous lesion. Chronic endplate compression deformity at L2. IMPRESSION: 1. No acute abdominopelvic findings. 2. No abdominal wall hernia. 3. Post hysterectomy Electronically Signed   By: Suzy Bouchard M.D.    On: 07/23/2019 12:22    Assessment & Plan:   Jadan was seen today for rash.  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  Since it is not bothering her I did not recommend a topical steroid.  I have asked her to see dermatology to get a biopsy confirmation and to consider treatment options. -     Ambulatory referral to Dermatology   I am having Derl Barrow maintain her levothyroxine, Accu-Chek Aviva Plus, [employer] Lancing Dev, rosuvastatin, aspirin EC, Trulicity, BIOTIN 6568 PO, Ergocalciferol (VITAMIN D2 PO), famotidine, furosemide, gabapentin, ALPRAZolam, cyanocobalamin, linaclotide, and lisinopril.  No orders of the defined types were placed in this encounter.    Follow-up: Return in about 3 months (  around 11/05/2019).  Scarlette Calico, MD

## 2019-08-11 ENCOUNTER — Ambulatory Visit: Payer: Medicare Other | Admitting: Internal Medicine

## 2019-08-25 ENCOUNTER — Telehealth: Payer: Self-pay | Admitting: Physician Assistant

## 2019-08-25 NOTE — Telephone Encounter (Signed)
The pt called complaining of bloating and early satiety.  She is taking famotidine as prescribed.  She saw Janett Billow on  2/23 for constipation and had CT on 3/10 that showed gallstones Janett Billow does not think that they are causing her symptoms).  She is having somewhat normal BM.  She said she would like to have a bowel movement every day but usually goes every other day.  She preferred to have an appt with Dr Fuller Plan to discuss.  Appt made for 5/4.

## 2019-09-16 ENCOUNTER — Ambulatory Visit: Payer: Medicare Other | Admitting: Gastroenterology

## 2019-09-16 ENCOUNTER — Encounter: Payer: Self-pay | Admitting: Gastroenterology

## 2019-09-16 VITALS — BP 138/90 | HR 72 | Temp 97.8°F | Ht 65.0 in | Wt 179.0 lb

## 2019-09-16 DIAGNOSIS — K5904 Chronic idiopathic constipation: Secondary | ICD-10-CM | POA: Diagnosis not present

## 2019-09-16 DIAGNOSIS — K3189 Other diseases of stomach and duodenum: Secondary | ICD-10-CM

## 2019-09-16 DIAGNOSIS — K219 Gastro-esophageal reflux disease without esophagitis: Secondary | ICD-10-CM

## 2019-09-16 DIAGNOSIS — K31A0 Gastric intestinal metaplasia, unspecified: Secondary | ICD-10-CM | POA: Insufficient documentation

## 2019-09-16 NOTE — Progress Notes (Signed)
    History of Present Illness: This is a 74 year old female with constipation, bloating, early satiety.  She has undergone EGD, colonoscopy, CT AP within the past 2 years.  She has a BM qod on Linzess.  Often the bowel movement is small.  She has the feeling of incomplete fecal evacuation.  She notes ongoing difficulties with bloating and early satiety.  She occasionally takes a half bottle of magnesium citrate which is very effective in relieving her constipation and she notes for a day or 2 following her mag citrate dose her bloating and early satiety are not present.  Current Medications, Allergies, Past Medical History, Past Surgical History, Family History and Social History were reviewed in [employer] record.   Physical Exam: General: Well developed, well nourished, no acute distress Head: Normocephalic and atraumatic Eyes:  sclerae anicteric, EOMI Ears: Normal auditory acuity Mouth: Not examined, mask on during Covid-19 pandemic Lungs: Clear throughout to auscultation Heart: Regular rate and rhythm; no murmurs, rubs or bruits Abdomen: Soft, non tender and non distended. No masses, hepatosplenomegaly or hernias noted. Normal Bowel sounds Rectal: Not done Musculoskeletal: Symmetrical with no gross deformities  Pulses:  Normal pulses noted Extremities: No clubbing, cyanosis, edema or deformities noted Neurological: Alert oriented x 4, grossly nonfocal Psychological:  Alert and cooperative. Normal mood and affect   Assessment and Recommendations:  1. CIC.  Abdominal bloating and early satiety appear to be secondary to her constipation.  Continue Linzess 290 mcg daily.  Add MiraLAX once daily for 1 week and if not having a complete bowel movement daily increase to MiraLAX twice daily.  If this regimen is not effective after 3 weeks she is advised to call for additional advice. REV in 6 weeks.   2.  GERD.   Esophageal inlet patch on October 2019 EGD.  October  2019 pathology report and pathology review erroneously listed Barrett's esophagus as a diagnosis.  Follow standard antireflux measures.  Continue famotidine 20 mg po bid.   3.  Cholelithiasis noted on recent CT AP.  Currently appears to be asymptomatic however if early satiety symptoms persist despite adequate treatment of her constipation consider cholelithiasis as a potential cause of symptoms.  4.  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  Surveillance endoscopy,  gastric mapping in November 2022.

## 2019-09-16 NOTE — Patient Instructions (Signed)
Stay on Linzess 290 mcg daily.   Start taking Miralax mixing 17 grams in 8 oz of water/juice daily in addition to [place]. After one week if you do not see improvement in your symptoms, increase your Miralax to twice daily.   Call back in 3 weeks if you so no improvement in your symptoms.   Thank you for choosing me and [facility].  Pricilla Riffle. Dagoberto Ligas., MD., Mar[facility] Regal

## 2019-09-21 ENCOUNTER — Other Ambulatory Visit: Payer: Self-pay | Admitting: Internal Medicine

## 2019-09-21 DIAGNOSIS — I10 Essential (primary) hypertension: Secondary | ICD-10-CM

## 2019-09-24 ENCOUNTER — Other Ambulatory Visit: Payer: Self-pay | Admitting: Internal Medicine

## 2019-09-24 DIAGNOSIS — F411 Generalized anxiety disorder: Secondary | ICD-10-CM

## 2019-09-25 NOTE — Telephone Encounter (Signed)
No need to send early refill requests to me as they will be denied  Xanax too soon - has one refill

## 2019-09-29 ENCOUNTER — Other Ambulatory Visit: Payer: Self-pay | Admitting: Internal Medicine

## 2019-09-29 DIAGNOSIS — E538 Deficiency of other specified B group vitamins: Secondary | ICD-10-CM

## 2019-10-06 ENCOUNTER — Encounter: Payer: Self-pay | Admitting: Dermatology

## 2019-10-06 ENCOUNTER — Ambulatory Visit: Payer: Medicare Other | Admitting: Dermatology

## 2019-10-06 ENCOUNTER — Other Ambulatory Visit: Payer: Self-pay

## 2019-10-06 DIAGNOSIS — L815 Leukoderma, not elsewhere classified: Secondary | ICD-10-CM

## 2019-10-06 DIAGNOSIS — L818 Other specified disorders of pigmentation: Secondary | ICD-10-CM | POA: Diagnosis not present

## 2019-10-06 NOTE — Progress Notes (Signed)
   New Patient   Subjective  Katie [name] is a 74 y.o. female who presents for the following: Skin Problem (abdomen and lower back color change no itch x months otc lotion).  Loss of pigment Location: Torso and right upper leg Duration: Few years Quality:  Associated Signs/Symptoms: Modifying Factors:  Severity:  Timing: Context: This causes the patient some anxiety. Of note is a reference in her past medical history of a diagnosis of localized morphea; there is no evidence of this on today's examination.   The following portions of the chart were reviewed this encounter and updated as appropriate:     Objective  Well appearing patient in no apparent distress; mood and affect are within normal limits.  A focused examination included the head, face, ears, neck, arms, back, legs, abdomen.   Assessment & Plan  Guttate hypomelanosis (3) Left Suprapubic Area; Left Malar Cheek; Right Malar Cheek  Post inflammatory hypopigmentation (2) Left Abdomen (side) - Lower; Mid Back  Reassure patient that none of these changes are related to a lack of immunity and likely most of them will be stable and not increased in size.  Photograph of the umbilicus taken; we will recheck this area in 1 year New patient visit for Katie [name].  Her chief complaint related to changes in pigment, most notably on the lower back, around the bellybutton, right upper leg, and (noted after receiving her Covid vaccination) smaller spots on her abdomen.  She was concerned that the spots may indicate a lack of a good immune system and was anxious that these could result in widespread loss of pigmentation.  Clinically the spots on the lower back and right upper leg are irregular hypopigmented (not deep pigmented) 5 cm patches which may be related to the effects of injections in these areas (she has had several cortisone injections into her lower spine).  The light spots on her abdomen are 3 to 4 mm hypopigmented macules  which would fit guttate hypomelanosis; when discussing this, [name] added that some of them seem to have some crusting before the loss of pigment, so there is a possibility of postinflammatory hypopigmentation, perhaps related to something like PLEVA.  There is a hypopigmented macule below the left nostril which the patient feels quite certain is related to irritation from having her upper lip waxed several years ago.  Finally there is a mixture of hyperpigmentation and hypopigmentation above the umbilicus which the patient feels is related to repair of an umbilical hernia years ago by Dr. Richardson Landry for, although she did notice the pigmentary change until months or years after this procedure.  This also appears to be postinflammatory.  At the conclusion of the visit, [name] also asked about options for the hyperpigmentation underneath her eyes.  She strongly relates this to her former work with the [employer] which involved long periods with chronic sleep deprivation.  I told her that such hyperpigmentation is often multifactorial and not the focus of my practice.  We will try to contact Dr. Lois Huxley to see if she is still seeing patients in [facility] and whether this would be an appropriate referral.  Photographs of the periumbilical area taken and we will plan on recheck in 1 year unless there is earlier clinical change.

## 2019-10-07 ENCOUNTER — Telehealth: Payer: Self-pay | Admitting: Dermatology

## 2019-10-07 ENCOUNTER — Encounter: Payer: Self-pay | Admitting: Dermatology

## 2019-10-07 DIAGNOSIS — L815 Leukoderma, not elsewhere classified: Secondary | ICD-10-CM

## 2019-10-07 DIAGNOSIS — L818 Other specified disorders of pigmentation: Secondary | ICD-10-CM

## 2019-10-07 NOTE — Telephone Encounter (Signed)
Wants to talk to nurse: didn't under what was going on with spots on stomach + back.

## 2019-10-08 NOTE — Telephone Encounter (Signed)
Phone call to patient returning her call to answer her questions.  Patient states that she wanted to know if Dr. Denna Haggard had recommended her to try anything for the spots on her abdomen and back.  I informed patient that per Dr. Onalee Hua note there's nothing documented for her to try anything just return in one year.  Patient aware.

## 2019-10-08 NOTE — Telephone Encounter (Signed)
Phone call to patient with her referral appointment for Dr. Lois Huxley.  Patient aware of appointment.

## 2019-10-08 NOTE — Telephone Encounter (Signed)
Phone call to Dr. Warren Lacy McMichael's office per Dr. Onalee Hua request to get patient referred over for evaluation.  Appointment made for patient with Dr. Lois Huxley on 07/29/2020 @ 1pm patient is to arrive 15 minutes early with a list of medication and insurance card.

## 2019-10-12 ENCOUNTER — Other Ambulatory Visit: Payer: Self-pay | Admitting: Internal Medicine

## 2019-10-18 ENCOUNTER — Other Ambulatory Visit: Payer: Self-pay | Admitting: Gastroenterology

## 2019-10-30 ENCOUNTER — Other Ambulatory Visit: Payer: Self-pay

## 2019-10-30 ENCOUNTER — Ambulatory Visit: Payer: Medicare Other | Admitting: Gastroenterology

## 2019-10-30 ENCOUNTER — Ambulatory Visit (INDEPENDENT_AMBULATORY_CARE_PROVIDER_SITE_OTHER): Payer: Medicare Other

## 2019-10-30 ENCOUNTER — Telehealth: Payer: Self-pay | Admitting: Gastroenterology

## 2019-10-30 ENCOUNTER — Ambulatory Visit: Payer: Medicare Other | Admitting: Internal Medicine

## 2019-10-30 ENCOUNTER — Encounter: Payer: Self-pay | Admitting: Internal Medicine

## 2019-10-30 VITALS — BP 152/82 | HR 62 | Temp 98.0°F | Ht 65.0 in | Wt 172.0 lb

## 2019-10-30 DIAGNOSIS — M542 Cervicalgia: Secondary | ICD-10-CM

## 2019-10-30 DIAGNOSIS — M5030 Other cervical disc degeneration, unspecified cervical region: Secondary | ICD-10-CM

## 2019-10-30 DIAGNOSIS — E039 Hypothyroidism, unspecified: Secondary | ICD-10-CM

## 2019-10-30 DIAGNOSIS — Z0000 Encounter for general adult medical examination without abnormal findings: Secondary | ICD-10-CM | POA: Diagnosis not present

## 2019-10-30 DIAGNOSIS — R739 Hyperglycemia, unspecified: Secondary | ICD-10-CM | POA: Diagnosis not present

## 2019-10-30 DIAGNOSIS — I10 Essential (primary) hypertension: Secondary | ICD-10-CM | POA: Diagnosis not present

## 2019-10-30 DIAGNOSIS — N1832 Chronic kidney disease, stage 3b: Secondary | ICD-10-CM | POA: Diagnosis not present

## 2019-10-30 LAB — BASIC METABOLIC PANEL
BUN: 17 mg/dL (ref 6–23)
CO2: 30 mEq/L (ref 19–32)
Calcium: 10.2 mg/dL (ref 8.4–10.5)
Chloride: 101 mEq/L (ref 96–112)
Creatinine, Ser: 1.37 mg/dL — ABNORMAL HIGH (ref 0.40–1.20)
GFR: 45.5 mL/min — ABNORMAL LOW (ref >60.00)
Glucose, Bld: 100 mg/dL — ABNORMAL HIGH (ref 70–99)
Potassium: 3.9 mEq/L (ref 3.5–5.1)
Sodium: 138 mEq/L (ref 135–145)

## 2019-10-30 LAB — TSH: TSH: 1.48 u[IU]/mL (ref 0.35–4.50)

## 2019-10-30 LAB — HEMOGLOBIN A1C: Hgb A1c MFr Bld: 5.9 % (ref 4.6–6.5)

## 2019-10-30 MED ORDER — INDAPAMIDE 1.25 MG PO TABS: 1.2500 mg | ORAL_TABLET | Freq: Every day | ORAL | 0 refills | Status: DC

## 2019-10-30 MED ORDER — FAMOTIDINE 20 MG PO TABS: 40.0000 mg | ORAL_TABLET | Freq: Every day | ORAL | 1 refills | Status: DC

## 2019-10-30 NOTE — Patient Instructions (Signed)

## 2019-10-30 NOTE — Patient Instructions (Signed)
Katie [name] , Thank you for taking time to come for your Medicare Wellness Visit. I appreciate your ongoing commitment to your health goals. Please review the following plan we discussed and let me know if I can assist you in the future.   Screening recommendations/referrals: Colonoscopy: 01/30/2019; due every 7 years Mammogram: 05/01/2019; due every year Bone Density: 11/13/2016; due every 2-3 years Recommended yearly ophthalmology/optometry visit for glaucoma screening and checkup Recommended yearly dental visit for hygiene and checkup  Vaccinations: Influenza vaccine: 02/02/2019 Pneumococcal vaccine: completed Tdap vaccine: 03/16/2015; due every 10 years Shingles vaccine: completed   Covid-19: completed  Advanced directives: Please bring a copy of your health care power of attorney and living will to the office at your convenience.  Conditions/risks identified: Please continue to do your personal lifestyle choices by: daily care of teeth and gums, regular physical activity (goal should be 5 days a week for 30 minutes), eat a healthy diet, avoid tobacco and drug use, limiting any alcohol intake, taking a low-dose aspirin (if not allergic or have been advised by your provider otherwise) and taking vitamins and minerals as recommended by your provider.  Next appointment: Please schedule your next Medicare Wellness Visit with your Nurse Health Advisor in 1 year.   Preventive Care 40 Years and Older, Female Preventive care refers to lifestyle choices and visits with your health care provider that can promote health and wellness. What does preventive care include?  A yearly physical exam. This is also called an annual well check.  Dental exams once or twice a year.  Routine eye exams. Ask your health care provider how often you should have your eyes checked.  Personal lifestyle choices, including:  Daily care of your teeth and gums.  Regular physical activity.  Eating a healthy  diet.  Avoiding tobacco and drug use.  Limiting alcohol use.  Practicing safe sex.  Taking low-dose aspirin every day.  Taking vitamin and mineral supplements as recommended by your health care provider. What happens during an annual well check? The services and screenings done by your health care provider during your annual well check will depend on your age, overall health, lifestyle risk factors, and family history of disease. Counseling  Your health care provider may ask you questions about your:  Alcohol use.  Tobacco use.  Drug use.  Emotional well-being.  Home and relationship well-being.  Sexual activity.  Eating habits.  History of falls.  Memory and ability to understand (cognition).  Work and work Statistician.  Reproductive health. Screening  You may have the following tests or measurements:  Height, weight, and BMI.  Blood pressure.  Lipid and cholesterol levels. These may be checked every 5 years, or more frequently if you are over 82 years old.  Skin check.  Lung cancer screening. You may have this screening every year starting at age 27 if you have a 30-pack-year history of smoking and currently smoke or have quit within the past 15 years.  Fecal occult blood test (FOBT) of the stool. You may have this test every year starting at age 55.  Flexible sigmoidoscopy or colonoscopy. You may have a sigmoidoscopy every 5 years or a colonoscopy every 10 years starting at age 35.  Hepatitis C blood test.  Hepatitis B blood test.  Sexually transmitted disease (STD) testing.  Diabetes screening. This is done by checking your blood sugar (glucose) after you have not eaten for a while (fasting). You may have this done every 1-3 years.  Bone density  scan. This is done to screen for osteoporosis. You may have this done starting at age 17.  Mammogram. This may be done every 1-2 years. Talk to your health care provider about how often you should have  regular mammograms. Talk with your health care provider about your test results, treatment options, and if necessary, the need for more tests. Vaccines  Your health care provider may recommend certain vaccines, such as:  Influenza vaccine. This is recommended every year.  Tetanus, diphtheria, and acellular pertussis (Tdap, Td) vaccine. You may need a Td booster every 10 years.  Zoster vaccine. You may need this after age 76.  Pneumococcal 13-valent conjugate (PCV13) vaccine. One dose is recommended after age 89.  Pneumococcal polysaccharide (PPSV23) vaccine. One dose is recommended after age 5. Talk to your health care provider about which screenings and vaccines you need and how often you need them. This information is not intended to replace advice given to you by your health care provider. Make sure you discuss any questions you have with your health care provider. Document Released: 05/28/2015 Document Revised: 01/19/2016 Document Reviewed: 03/02/2015 Elsevier Interactive Patient Education  2017 [place] Prevention in the Home Falls can cause injuries. They can happen to people of all ages. There are many things you can do to make your home safe and to help prevent falls. What can I do on the outside of my home?  Regularly fix the edges of walkways and driveways and fix any cracks.  Remove anything that might make you trip as you walk through a door, such as a raised step or threshold.  Trim any bushes or trees on the path to your home.  Use bright outdoor lighting.  Clear any walking paths of anything that might make someone trip, such as rocks or tools.  Regularly check to see if handrails are loose or broken. Make sure that both sides of any steps have handrails.  Any raised decks and porches should have guardrails on the edges.  Have any leaves, snow, or ice cleared regularly.  Use sand or salt on walking paths during winter.  Clean up any spills in your  garage right away. This includes oil or grease spills. What can I do in the bathroom?  Use night lights.  Install grab bars by the toilet and in the tub and shower. Do not use towel bars as grab bars.  Use non-skid mats or decals in the tub or shower.  If you need to sit down in the shower, use a plastic, non-slip stool.  Keep the floor dry. Clean up any water that spills on the floor as soon as it happens.  Remove soap buildup in the tub or shower regularly.  Attach bath mats securely with double-sided non-slip rug tape.  Do not have throw rugs and other things on the floor that can make you trip. What can I do in the bedroom?  Use night lights.  Make sure that you have a light by your bed that is easy to reach.  Do not use any sheets or blankets that are too big for your bed. They should not hang down onto the floor.  Have a firm chair that has side arms. You can use this for support while you get dressed.  Do not have throw rugs and other things on the floor that can make you trip. What can I do in the kitchen?  Clean up any spills right away.  Avoid walking on wet  floors.  Keep items that you use a lot in easy-to-reach places.  If you need to reach something above you, use a strong step stool that has a grab bar.  Keep electrical cords out of the way.  Do not use floor polish or wax that makes floors slippery. If you must use wax, use non-skid floor wax.  Do not have throw rugs and other things on the floor that can make you trip. What can I do with my stairs?  Do not leave any items on the stairs.  Make sure that there are handrails on both sides of the stairs and use them. Fix handrails that are broken or loose. Make sure that handrails are as long as the stairways.  Check any carpeting to make sure that it is firmly attached to the stairs. Fix any carpet that is loose or worn.  Avoid having throw rugs at the top or bottom of the stairs. If you do have throw  rugs, attach them to the floor with carpet tape.  Make sure that you have a light switch at the top of the stairs and the bottom of the stairs. If you do not have them, ask someone to add them for you. What else can I do to help prevent falls?  Wear shoes that:  Do not have high heels.  Have rubber bottoms.  Are comfortable and fit you well.  Are closed at the toe. Do not wear sandals.  If you use a stepladder:  Make sure that it is fully opened. Do not climb a closed stepladder.  Make sure that both sides of the stepladder are locked into place.  Ask someone to hold it for you, if possible.  Clearly mark and make sure that you can see:  Any grab bars or handrails.  First and last steps.  Where the edge of each step is.  Use tools that help you move around (mobility aids) if they are needed. These include:  Canes.  Walkers.  Scooters.  Crutches.  Turn on the lights when you go into a dark area. Replace any light bulbs as soon as they burn out.  Set up your furniture so you have a clear path. Avoid moving your furniture around.  If any of your floors are uneven, fix them.  If there are any pets around you, be aware of where they are.  Review your medicines with your doctor. Some medicines can make you feel dizzy. This can increase your chance of falling. Ask your doctor what other things that you can do to help prevent falls. This information is not intended to replace advice given to you by your health care provider. Make sure you discuss any questions you have with your health care provider. Document Released: 02/25/2009 Document Revised: 10/07/2015 Document Reviewed: 06/05/2014 Elsevier Interactive Patient Education  2017 [employer].

## 2019-10-30 NOTE — Telephone Encounter (Signed)
Patient requesting refill on Pepcid

## 2019-10-30 NOTE — Progress Notes (Signed)
Subjective:   Katie [name] is a 74 y.o. female who presents for Medicare Annual (Subsequent) preventive examination.  Review of Systems:  No ROS. Medicare Wellness Visit Cardiac Risk Factors include: advanced age (>7men, >44 women);family history of premature cardiovascular disease;hypertension     Objective:     Vitals: BP (!) 152/82 (BP Location: Left Arm, Patient Position: Sitting, Cuff Size: Normal)   Pulse 62   Temp 98 F (36.7 C)   Ht 5\' 5"  (1.651 m)   Wt 172 lb (78 kg)   SpO2 97%   BMI 28.62 kg/m   Body mass index is 28.62 kg/m.  Advanced Directives 10/30/2019 04/24/2018 03/28/2018 06/21/2017 03/26/2017 03/14/2016 02/16/2016  Does Patient Have a Medical Advance Directive? Yes Yes No Yes No No Yes  Type of Advance Directive Living will Living will - Living will - - -  Does patient want to make changes to medical advance directive? No - Patient declined - - - - - -  Copy of Press photographer in Chart? - - - - - - No - copy requested  Would patient like information on creating a medical advance directive? - - Yes (ED - Information included in AVS) - Yes (ED - Information included in AVS) Yes - Educational materials given -    Tobacco Social History   Tobacco Use  Smoking Status Never Smoker  Smokeless Tobacco Never Used     Counseling given: No   Clinical Intake:  Pre-visit preparation completed: Yes  Pain : No/denies pain Pain Score: 0-No pain     BMI - recorded: 28.62 Nutritional Status: BMI 25 -29 Overweight Nutritional Risks: None Diabetes: No  How often do you need to have someone help you when you read instructions, pamphlets, or other written materials from your doctor or pharmacy?: 1 - Never What is the last grade level you completed in school?: Master's Degree  Interpreter Needed?: No  Information entered by :: Keith Felten N. Lowell Guitar, LPN  Past Medical History:  Diagnosis Date  . Acute venous embolism and thrombosis of unspecified  deep vessels of lower extremity 2001   RLE DVT and PE, coumadin x 30mo, now ASSA  . Anxiety state, unspecified   . Chronic idiopathic constipation 11/02/2016  . Degeneration of cervical intervertebral disc   . Gallstones   . History of DVT (deep vein thrombosis)   . Iron deficiency anemia secondary to blood loss (chronic)   . Irritable bowel syndrome   . Lumbago   . Other B-complex deficiencies   . Personal history of colonic polyps    adenomatous  . Pure hypercholesterolemia   . Reflux esophagitis   . Tubular adenoma of colon 09/2002  . Type II or unspecified type diabetes mellitus without mention of complication, not stated as uncontrolled   . Unspecified essential hypertension   . Unspecified hypothyroidism   . Unspecified iridocyclitis   . Unspecified venous (peripheral) insufficiency   . Unspecified vitamin D deficiency    Past Surgical History:  Procedure Laterality Date  . BIOPSY  04/24/2018   Procedure: BIOPSY;  Surgeon: Rush Landmark Telford Nab., MD;  Location: [facility];  Service: Gastroenterology;;  . CARPAL TUNNEL RELEASE    . CATARACT EXTRACTION Bilateral 08/29/2017,09/19/17  . ESOPHAGOGASTRODUODENOSCOPY (EGD) WITH PROPOFOL N/A 04/24/2018   Procedure: ESOPHAGOGASTRODUODENOSCOPY (EGD) WITH PROPOFOL;  Surgeon: Rush Landmark Telford Nab., MD;  Location: [facility];  Service: Gastroenterology;  Laterality: N/A;  with EMR  . FEMORAL HERNIA REPAIR    . OOPHORECTOMY    .  POLYPECTOMY  04/24/2018   Procedure: POLYPECTOMY;  Surgeon: Mansouraty, Telford Nab., MD;  Location: [facility];  Service: Gastroenterology;;  . UPPER ESOPHAGEAL ENDOSCOPIC ULTRASOUND (EUS)  04/24/2018   Procedure: UPPER ESOPHAGEAL ENDOSCOPIC ULTRASOUND (EUS);  Surgeon: Rush Landmark Telford Nab., MD;  Location: [facility];  Service: Gastroenterology;;  . VESICOVAGINAL FISTULA CLOSURE W/ TAH     Family History  Problem Relation Age of Onset  . Prostate cancer Father   . Diabetes Mother         retinopathy/blind  . Deep vein thrombosis Mother   . Hypertension Mother   . Healthy Sister   . Colon cancer Neg Hx   . Esophageal cancer Neg Hx   . Stomach cancer Neg Hx   . Rectal cancer Neg Hx    Social History   Socioeconomic History  . Marital status: Married    Spouse name: Maddison Kilner  . Number of children: 0  . Years of education: masters  . Highest education level: Not on file  Occupational History  . Occupation: record Firefighter: [employer]  Tobacco Use  . Smoking status: Never Smoker  . Smokeless tobacco: Never Used  Vaping Use  . Vaping Use: Never used  Substance and Sexual Activity  . Alcohol use: Yes    Alcohol/week: 0.0 standard drinks    Comment: occasional-wine   . Drug use: No  . Sexual activity: Not Currently  Other Topics Concern  . Not on file  Social History Narrative   Lives with husband in a 2 story home with a basement.  Has no children.     Retired from the police department.     Education: masters (guidance and counseling).    Social Determinants of Health   Financial Resource Strain:   . Difficulty of Paying Living Expenses:   Food Insecurity:   . Worried About Charity fundraiser in the Last Year:   . Arboriculturist in the Last Year:   Transportation Needs:   . Film/video editor (Medical):   Marland Kitchen Lack of Transportation (Non-Medical):   Physical Activity:   . Days of Exercise per Week:   . Minutes of Exercise per Session:   Stress:   . Feeling of Stress :   Social Connections:   . Frequency of Communication with Friends and Family:   . Frequency of Social Gatherings with Friends and Family:   . Attends Religious Services:   . Active Member of Clubs or Organizations:   . Attends Archivist Meetings:   Marland Kitchen Marital Status:     Outpatient Encounter Medications as of 10/30/2019  Medication Sig  . ACCU-CHEK AVIVA PLUS test strip   . ALPRAZolam (XANAX) 0.5 MG tablet TAKE 1 TABLET BY MOUTH 3  TIMES DAILY AS  NEEDED FOR  ANXIETY OR SLEEP  . aspirin EC 81 MG tablet Take 1 tablet (81 mg total) by mouth daily.  Marland Kitchen BIOTIN 5000 PO Take by mouth.  . cyanocobalamin (,VITAMIN B-12,) 1000 MCG/ML injection INJECT 1 ML INTRAMUSCULARLY  ONCE EVERY MONTH  . Digestive Enzymes (DIGESTIVE ENZYME PO) Take 1 capsule by mouth daily.  . Dulaglutide (TRULICITY) 1.5 YS/0.6TK SOPN Inject into the skin every 14 (fourteen) days.  . Ergocalciferol (VITAMIN D2 PO) Take by mouth.  . famotidine (PEPCID) 20 MG tablet TAKE 2 TABLETS BY MOUTH EVERY DAY  . gabapentin (NEURONTIN) 100 MG capsule Take 1 capsule (100 mg total) by mouth 3 (three) times daily. (Patient taking differently:  Take 100 mg by mouth 3 (three) times daily as needed. )  . indapamide (LOZOL) 1.25 MG tablet Take 1 tablet (1.25 mg total) by mouth daily.  Elmore Guise Devices (SIMPLE DIAGNOSTICS LANCING DEV) MISC   . levothyroxine (SYNTHROID, LEVOTHROID) 88 MCG tablet Take 88 mcg by mouth daily.    Marland Kitchen LINZESS 290 MCG CAPS capsule TAKE 1 CAPSULE BY MOUTH ONCE DAILY BEFORE BREAKFAST  . lisinopril (ZESTRIL) 10 MG tablet Take 1 tablet by mouth once daily  . rosuvastatin (CRESTOR) 10 MG tablet Take 10 mg by mouth daily.   No facility-administered encounter medications on file as of 10/30/2019.    Activities of Daily Living In your present state of health, do you have any difficulty performing the following activities: 10/30/2019  Hearing? N  Vision? N  Difficulty concentrating or making decisions? N  Walking or climbing stairs? N  Dressing or bathing? N  Doing errands, shopping? N  Preparing Food and eating ? N  Using the Toilet? N  In the past six months, have you accidently leaked urine? N  Do you have problems with loss of bowel control? N  Managing your Medications? N  Managing your Finances? N  Housekeeping or managing your Housekeeping? N  Some recent data might be hidden    Patient Care Team: Janith Lima, MD as PCP - General (Internal  Medicine) Altheimer, Legrand Como, MD (Endocrinology) Ladene Artist, MD (Gastroenterology) Rosemary Holms, [place] (Podiatry) Rutherford Guys, MD (Ophthalmology) Delila Pereyra, MD (Gynecology) Lavonna Monarch, MD as Consulting Physician (Dermatology)    Assessment:   This is a routine wellness examination for Clary.  Exercise Activities and Dietary recommendations Current Exercise Habits: Structured exercise class, Type of exercise: strength training/weights;stretching;treadmill;walking, Time (Minutes): 60, Frequency (Times/Week): 5, Weekly Exercise (Minutes/Week): 300, Intensity: Moderate, Exercise limited by: orthopedic condition(s)  Goals    .  Have 3 meals a day      Will try 3 balanced meals per day; or balancing diet Will refer to the diabetes and nutrition center Monday night 6pm; Diabetes Type 2 class;     .  Patient Stated      I want to travel and see places I have not seen before.    .  Patient Stated (pt-stated)      Would like to lose 20 pounds and continue to stay active by going to the gym and working out.       Fall Risk Fall Risk  10/30/2019 12/30/2018 03/28/2018 03/26/2017 12/18/2016  Falls in the past year? 0 0 0 No No  Number falls in past yr: 0 0 - - -  Injury with Fall? 0 0 - - -  Risk for fall due to : Orthopedic patient - - - -  Follow up Falls evaluation completed Falls evaluation completed - - -   Is the patient's home free of loose throw rugs in walkways, pet beds, electrical cords, etc?   yes      Grab bars in the bathroom? yes      Handrails on the stairs?   yes      Adequate lighting?   yes  Timed Get Up and Go performed: not indicated  Depression Screen PHQ 2/9 Scores 10/30/2019 12/30/2018 03/28/2018 03/26/2017  PHQ - 2 Score 0 0 0 1  PHQ- 9 Score - - 2 2     Cognitive Function MMSE - Mini Mental State Exam 03/26/2017 02/16/2016  Not completed: - (No Data)  Orientation to time 5 -  Orientation to Place 5 -  Registration 3 -  Attention/  Calculation 4 -  Recall 2 -  Language- name 2 objects 2 -  Language- repeat 1 -  Language- follow 3 step command 3 -  Language- read & follow direction 1 -  Write a sentence 1 -  Copy design 1 -  Total score 28 -     6CIT Screen 10/30/2019  What Year? 0 points  What month? 0 points  What time? 0 points  Count back from 20 0 points  Months in reverse 0 points  Repeat phrase 0 points  Total Score 0    Immunization History  Administered Date(s) Administered  . Influenza Split 02/28/2012, 02/15/2013, 01/27/2014  . Influenza Whole 02/13/2008, 04/07/2009  . Influenza, High Dose Seasonal PF 01/29/2018, 02/02/2019  . Influenza-Unspecified 02/09/2015, 02/04/2016, 03/01/2017  . PFIZER SARS-COV-2 Vaccination 06/21/2019, 07/16/2019  . Pneumococcal Conjugate-13 08/11/2014  . Pneumococcal Polysaccharide-23 05/31/2011, 06/18/2017  . Tdap 03/16/2015  . Zoster Recombinat (Shingrix) 05/09/2017    Qualifies for Shingles Vaccine? completed  Screening Tests Health Maintenance  Topic Date Due  . HEMOGLOBIN A1C  10/30/2019  . OPHTHALMOLOGY EXAM  12/03/2019  . INFLUENZA VACCINE  12/14/2019  . MAMMOGRAM  04/29/2020  . FOOT EXAM  04/30/2020  . TETANUS/TDAP  03/15/2025  . COLONOSCOPY  01/29/2026  . DEXA SCAN  Completed  . COVID-19 Vaccine  Completed  . Hepatitis C Screening  Completed  . PNA vac Low Risk Adult  Completed    Cancer Screenings: Lung: Low Dose CT Chest recommended if Age 8-80 years, 30 pack-year currently smoking OR have quit w/in 15years. Patient does not qualify. Breast:  Up to date on Mammogram? Yes   Up to date of Bone Density/Dexa? Yes Colorectal: Yes  Additional Screenings: Hepatitis C Screening: completed     Plan:     Reviewed health maintenance screenings with patient today and relevant education, vaccines, and/or referrals were provided.    Continue doing brain stimulating activities (puzzles, reading, adult coloring books, staying active) to keep memory  sharp.    Continue to eat heart healthy diet (full of fruits, vegetables, whole grains, lean protein, water--limit salt, fat, and sugar intake) and increase physical activity as tolerated.   I have personally reviewed and noted the following in the patient's chart:   . Medical and social history . Use of alcohol, tobacco or illicit drugs  . Current medications and supplements . Functional ability and status . Nutritional status . Physical activity . Advanced directives . List of other physicians . Hospitalizations, surgeries, and ER visits in previous 12 months . Vitals . Screenings to include cognitive, depression, and falls . Referrals and appointments  In addition, I have reviewed and discussed with patient certain preventive protocols, quality metrics, and best practice recommendations. A written personalized care plan for preventive services as well as general preventive health recommendations were provided to patient.     Sheral Flow, LPN  [id number]  Nurse Health Advisor

## 2019-10-30 NOTE — Telephone Encounter (Signed)
Prescription for pepcid sent to patient's pharmacy.

## 2019-10-30 NOTE — Progress Notes (Signed)
Subjective:  Patient ID: Katie [name], female    DOB: March 22, 1946  Age: 74 y.o. MRN: [id number]  CC: Hypertension  This visit occurred during the SARS-CoV-2 public health emergency.  Safety protocols were in place, including screening questions prior to the visit, additional usage of staff PPE, and extensive cleaning of exam room while observing appropriate contact time as indicated for disinfecting solutions.    HPI Katie [name] presents for f/up - She complains of a 1 week history of nontraumatic left-sided neck pain.  The pain does not radiate into her extremities.  She has chronic numbness in both hands over the fingers.  She continues to complain of anxiety and insomnia.  Outpatient Medications Prior to Visit  Medication Sig Dispense Refill  . ACCU-CHEK AVIVA PLUS test strip     . ALPRAZolam (XANAX) 0.5 MG tablet TAKE 1 TABLET BY MOUTH 3  TIMES DAILY AS NEEDED FOR  ANXIETY OR SLEEP 270 tablet 1  . aspirin EC 81 MG tablet Take 1 tablet (81 mg total) by mouth daily. 90 tablet 1  . BIOTIN 5000 PO Take by mouth.    . cyanocobalamin (,VITAMIN B-12,) 1000 MCG/ML injection INJECT 1 ML INTRAMUSCULARLY  ONCE EVERY MONTH 3 mL 0  . Digestive Enzymes (DIGESTIVE ENZYME PO) Take 1 capsule by mouth daily.    . Dulaglutide (TRULICITY) 1.5 MV/7.8IO SOPN Inject into the skin every 14 (fourteen) days.    . Ergocalciferol (VITAMIN D2 PO) Take by mouth.    . gabapentin (NEURONTIN) 100 MG capsule Take 1 capsule (100 mg total) by mouth 3 (three) times daily. (Patient taking differently: Take 100 mg by mouth 3 (three) times daily as needed. ) 90 capsule 5  . Lancet Devices (SIMPLE DIAGNOSTICS LANCING DEV) MISC     . levothyroxine (SYNTHROID, LEVOTHROID) 88 MCG tablet Take 88 mcg by mouth daily.      Marland Kitchen LINZESS 290 MCG CAPS capsule TAKE 1 CAPSULE BY MOUTH ONCE DAILY BEFORE BREAKFAST 30 capsule 0  . lisinopril (ZESTRIL) 10 MG tablet Take 1 tablet by mouth once daily 90 tablet 0  . rosuvastatin (CRESTOR) 10 MG  tablet Take 10 mg by mouth daily.    . famotidine (PEPCID) 20 MG tablet TAKE 2 TABLETS BY MOUTH EVERY DAY 180 tablet 1  . furosemide (LASIX) 20 MG tablet Take 1 tablet by mouth once daily 90 tablet 0   No facility-administered medications prior to visit.    ROS Review of Systems  Constitutional: Negative.  Negative for chills, diaphoresis, fatigue and fever.  HENT: Negative.   Eyes: Negative.   Respiratory: Negative for cough, chest tightness, shortness of breath and wheezing.   Cardiovascular: Negative for chest pain, palpitations and leg swelling.  Gastrointestinal: Negative for abdominal pain, blood in stool, constipation, diarrhea, nausea and vomiting.  Endocrine: Negative.  Negative for cold intolerance, heat intolerance, polydipsia, polyphagia and polyuria.  Genitourinary: Negative.  Negative for difficulty urinating and dysuria.  Musculoskeletal: Positive for neck pain. Negative for arthralgias and gait problem.  Skin: Negative.  Negative for color change, pallor and rash.  Neurological: Negative for dizziness, weakness and light-headedness.  Hematological: Negative for adenopathy. Does not bruise/bleed easily.  Psychiatric/Behavioral: Positive for sleep disturbance. Negative for decreased concentration and dysphoric mood. The patient is nervous/anxious.     Objective:  BP (!) 152/82 (BP Location: Left Arm, Patient Position: Sitting, Cuff Size: Normal)   Pulse 62   Temp 98 F (36.7 C) (Oral)   Ht 5\' 5"  (1.651 m)  Wt 172 lb (78 kg)   SpO2 97%   BMI 28.62 kg/m   BP Readings from Last 3 Encounters:  10/30/19 (!) 152/82  10/30/19 (!) 152/82  09/16/19 138/90    Wt Readings from Last 3 Encounters:  10/30/19 172 lb (78 kg)  10/30/19 172 lb (78 kg)  09/16/19 179 lb (81.2 kg)    Physical Exam Vitals reviewed.  Constitutional:      Appearance: Normal appearance.  HENT:     Nose: Nose normal.     Mouth/Throat:     Mouth: Mucous membranes are moist.  Eyes:      General: No scleral icterus.    Conjunctiva/sclera: Conjunctivae normal.  Cardiovascular:     Rate and Rhythm: Normal rate and regular rhythm.     Heart sounds: No murmur heard.   Pulmonary:     Effort: Pulmonary effort is normal.     Breath sounds: No stridor. No wheezing, rhonchi or rales.  Abdominal:     General: Abdomen is flat.     Palpations: There is no mass.     Tenderness: There is no abdominal tenderness. There is no guarding or rebound.     Hernia: No hernia is present.  Musculoskeletal:        General: Normal range of motion.     Cervical back: Neck supple.     Right lower leg: No edema.     Left lower leg: No edema.  Lymphadenopathy:     Cervical: No cervical adenopathy.  Skin:    General: Skin is warm and dry.     Coloration: Skin is not pale.  Neurological:     General: No focal deficit present.     Mental Status: She is alert and oriented to person, place, and time.     Lab Results  Component Value Date   WBC 4.8 05/01/2019   HGB 12.6 05/01/2019   HCT 38.1 05/01/2019   PLT 233.0 05/01/2019   GLUCOSE 100 (H) 10/30/2019   CHOL 113 12/30/2018   TRIG 71.0 12/30/2018   HDL 64.50 12/30/2018   LDLDIRECT 105.9 07/04/2007   LDLCALC 34 12/30/2018   ALT 19 12/30/2018   AST 25 12/30/2018   NA 138 10/30/2019   K 3.9 10/30/2019   CL 101 10/30/2019   CREATININE 1.37 (H) 10/30/2019   BUN 17 10/30/2019   CO2 30 10/30/2019   TSH 1.48 10/30/2019   HGBA1C 5.9 10/30/2019   MICROALBUR <0.7 12/30/2018    CT Abdomen Pelvis W Contrast  Result Date: 07/23/2019 CLINICAL DATA:  Worsening abdominal pain. Early satiety. Chronic constipation. EXAM: CT ABDOMEN AND PELVIS WITH CONTRAST TECHNIQUE: Multidetector CT imaging of the abdomen and pelvis was performed using the standard protocol following bolus administration of intravenous contrast. CONTRAST:  61mL OMNIPAQUE IOHEXOL 300 MG/ML  SOLN COMPARISON:  CT abdomen 06/06/2011 FINDINGS: Lower chest: Lung bases are clear.  Hepatobiliary: No focal hepatic lesion. Multiple gallstones noted. No gallbladder inflammation. Pancreas: Pancreas is normal. No ductal dilatation. No pancreatic inflammation. Spleen: Normal spleen Adrenals/urinary tract: Adrenal glands and kidneys are normal. The ureters and bladder normal. Stomach/Bowel: Stomach, small bowel, appendix, and cecum are normal. The colon and rectosigmoid colon are normal. Vascular/Lymphatic: Abdominal aorta is normal caliber. No periportal or retroperitoneal adenopathy. No pelvic adenopathy. Reproductive: Post hysterectomy Other: No free fluid. Musculoskeletal: No aggressive osseous lesion. Chronic endplate compression deformity at L2. IMPRESSION: 1. No acute abdominopelvic findings. 2. No abdominal wall hernia. 3. Post hysterectomy Electronically Signed   By: Nicole Kindred  Leonia Reeves M.D.   On: 07/23/2019 12:22   DG Cervical Spine Complete  Result Date: 10/30/2019 CLINICAL DATA:  Left-sided neck pain 2 weeks.  No injury EXAM: CERVICAL SPINE - COMPLETE 4+ VIEW COMPARISON:  None. FINDINGS: Vertebral body alignment and heights are normal. There is moderate spondylosis throughout the cervical spine. There is uncovertebral joint spurring and moderate facet arthropathy. Minimal disc space narrowing at the C4-5 level. Prevertebral soft tissues are normal. No significant neural foraminal narrowing. Atlantoaxial articulation is normal. IMPRESSION: Moderate spondylosis throughout the cervical spine with minimal disc space narrowing at the C4-5 level. Electronically Signed   By: Marin Olp M.D.   On: 10/30/2019 15:29     Assessment & Plan:   Katie [name] was seen today for hypertension.  Diagnoses and all orders for this visit:  Neck pain, acute- Plain films show moderate spondylosis in the cervical spine.  If her symptoms do not improve soon then we will discuss her undergoing an MRI to see if there is nerve impingement or spinal stenosis. -     DG Cervical Spine Complete;  Future  Essential hypertension- Her blood pressure is not adequately well controlled.  We will continue the ACE inhibitor.  I have asked her to change from a loop diuretic to a thiazide diuretic for better control of the blood pressure. -     indapamide (LOZOL) 1.25 MG tablet; Take 1 tablet (1.25 mg total) by mouth daily. -     Basic metabolic panel; Future -     Basic metabolic panel  Acquired hypothyroidism- Her TSH is in the normal range.  She remain on the current dose of levothyroxine. -     TSH; Future -     TSH  DEGENERATIVE DISC DISEASE, CERVICAL SPINE- See above. -     DG Cervical Spine Complete; Future  Stage 3b chronic kidney disease- Her renal function is stable.  She agrees to avoid nephrotoxic agents.  Will try to get better control of the blood pressure. -     Basic metabolic panel; Future -     Basic metabolic panel  Hyperglycemia -     Basic metabolic panel; Future -     Hemoglobin A1c; Future -     Hemoglobin A1c -     Basic metabolic panel   I have discontinued Deneene Moga's furosemide. I am also having her start on indapamide. Additionally, I am having her maintain her levothyroxine, Accu-Chek Aviva Plus, Simple Diagnostics Lancing Dev, rosuvastatin, aspirin EC, Trulicity, BIOTIN 5573 PO, Ergocalciferol (VITAMIN D2 PO), gabapentin, ALPRAZolam, Digestive Enzymes (DIGESTIVE ENZYME PO), cyanocobalamin, lisinopril, and Linzess.  Meds ordered this encounter  Medications  . indapamide (LOZOL) 1.25 MG tablet    Sig: Take 1 tablet (1.25 mg total) by mouth daily.    Dispense:  90 tablet    Refill:  0   I spent 50 minutes in preparing to see the patient by review of recent labs, imaging and procedures, obtaining and reviewing separately obtained history, communicating with the patient and family or caregiver, ordering medications, tests or procedures, and documenting clinical information in the EHR including the differential Dx, treatment, and any further evaluation and  other management of 1. Neck pain, acute 2. Essential hypertension 3. Acquired hypothyroidism 4. DEGENERATIVE DISC DISEASE, CERVICAL SPINE 5. Stage 3b chronic kidney disease 6. Hyperglycemia     Follow-up: Return in about 3 months (around 01/30/2020).  Scarlette Calico, MD

## 2019-11-07 ENCOUNTER — Telehealth: Payer: Self-pay | Admitting: Internal Medicine

## 2019-11-07 NOTE — Telephone Encounter (Signed)
Tried to call pt. Please inform pt to check her my chart.

## 2019-11-07 NOTE — Telephone Encounter (Signed)
New message:    Pt is returning a call for the CMA. Please advise.

## 2019-12-16 ENCOUNTER — Ambulatory Visit: Payer: Medicare Other | Admitting: Gastroenterology

## 2019-12-16 ENCOUNTER — Encounter: Payer: Self-pay | Admitting: Gastroenterology

## 2019-12-16 VITALS — BP 110/80 | HR 88 | Ht 64.25 in | Wt 175.1 lb

## 2019-12-16 DIAGNOSIS — K5904 Chronic idiopathic constipation: Secondary | ICD-10-CM | POA: Diagnosis not present

## 2019-12-16 DIAGNOSIS — K219 Gastro-esophageal reflux disease without esophagitis: Secondary | ICD-10-CM | POA: Diagnosis not present

## 2019-12-16 NOTE — Progress Notes (Signed)
    History of Present Illness: This is a 74 year old female with constipation and abdominal bloating.  See 09/16/2019 office note.  She states her symptoms have improved taking Linzess and MiraLAX regularly.  She decreased Linzess to every other day for cost reasons however this is not as effective as daily Linzess.  She has to use small amounts of magnesium citrate intermittently when her constipation and bloating is not well controlled.  She notes frequent upper intestinal gas but has difficulty belching.  Current Medications, Allergies, Past Medical History, Past Surgical History, Family History and Social History were reviewed in [employer] record.   Physical Exam: General: Well developed, well nourished, no acute distress Head: Normocephalic and atraumatic Eyes:  sclerae anicteric, EOMI Ears: Normal auditory acuity Mouth: Not examined, mask on during Covid-19 pandemic Lungs: Clear throughout to auscultation Heart: Regular rate and rhythm; no murmurs, rubs or bruits Abdomen: Soft, non tender and non distended. No masses, hepatosplenomegaly or hernias noted. Normal Bowel sounds Rectal: Not done  Musculoskeletal: Symmetrical with no gross deformities  Pulses:  Normal pulses noted Extremities: No clubbing, cyanosis, edema or deformities noted Neurological: Alert oriented x 4, grossly nonfocal Psychological:  Alert and cooperative. Normal mood and affect   Assessment and Recommendations:  1.  CIC.  Advised to remain on Linzess 290 mcg daily along with MiraLAX daily.  For cost reasons she states she will probably continue to take it every other day.  Advised her to increase MiraLAX to twice daily on the days when she does not take Linzess.  Minimize use of magnesium citrate but may use occasionally for more severe episodes of constipation. REV in 1 year.  2. GERD, gas.  Continue famotidine 40 mg daily.  Follow antireflux measures.  Gas-X or similar product 4 times  daily as needed gas. REV in 1 year.

## 2019-12-16 NOTE — Patient Instructions (Signed)
You can take over the counter Gas-x four times a day as needed.   You have been given a low gas diet to follow.   Patient advised to avoid spicy, acidic, citrus, chocolate, mints, fruit and fruit juices.  Limit the intake of caffeine, alcohol and Soda.  Don't exercise too soon after eating.  Don't lie down within 3-4 hours of eating.  Elevate the head of your bed.  Thank you for choosing me and [facility].  Pricilla Riffle. Dagoberto Ligas., MD., Marval Regal

## 2019-12-18 ENCOUNTER — Other Ambulatory Visit: Payer: Self-pay | Admitting: Internal Medicine

## 2019-12-18 DIAGNOSIS — F411 Generalized anxiety disorder: Secondary | ICD-10-CM

## 2019-12-19 ENCOUNTER — Telehealth: Payer: Self-pay | Admitting: Internal Medicine

## 2019-12-19 NOTE — Telephone Encounter (Signed)
   Patient calling to report "she feels something is wrong", dizziness, fatigue, BP 129/61  Call Transferred to [facility]

## 2019-12-20 ENCOUNTER — Ambulatory Visit: Payer: Medicare Other | Admitting: Family Medicine

## 2019-12-22 NOTE — Telephone Encounter (Signed)
Pt has not gone to the ED as of this afternoon.

## 2019-12-22 NOTE — Telephone Encounter (Signed)
Per [facility] note, Caller states has dizziness and fatigue and feels like something is wrong. She feels hot all the sudden as well. She states her energy has just depleted all the sudden. She states her heart is beating very fast and she is sitting down not doing anything.BP was 129/61 and pulse was 61. .   Advised to go to ED now.

## 2020-01-07 ENCOUNTER — Other Ambulatory Visit: Payer: Self-pay | Admitting: Gastroenterology

## 2020-01-07 ENCOUNTER — Other Ambulatory Visit: Payer: Self-pay | Admitting: Internal Medicine

## 2020-01-07 DIAGNOSIS — E538 Deficiency of other specified B group vitamins: Secondary | ICD-10-CM

## 2020-01-17 ENCOUNTER — Other Ambulatory Visit: Payer: Self-pay | Admitting: Internal Medicine

## 2020-01-29 ENCOUNTER — Ambulatory Visit: Payer: Medicare Other | Admitting: Internal Medicine

## 2020-01-29 ENCOUNTER — Ambulatory Visit (INDEPENDENT_AMBULATORY_CARE_PROVIDER_SITE_OTHER): Payer: Medicare Other

## 2020-01-29 ENCOUNTER — Encounter: Payer: Self-pay | Admitting: Internal Medicine

## 2020-01-29 ENCOUNTER — Other Ambulatory Visit: Payer: Self-pay

## 2020-01-29 VITALS — BP 126/84 | HR 85 | Temp 98.6°F | Ht 64.25 in | Wt 174.0 lb

## 2020-01-29 DIAGNOSIS — E538 Deficiency of other specified B group vitamins: Secondary | ICD-10-CM

## 2020-01-29 DIAGNOSIS — E785 Hyperlipidemia, unspecified: Secondary | ICD-10-CM

## 2020-01-29 DIAGNOSIS — M546 Pain in thoracic spine: Secondary | ICD-10-CM | POA: Diagnosis not present

## 2020-01-29 DIAGNOSIS — R7303 Prediabetes: Secondary | ICD-10-CM | POA: Diagnosis not present

## 2020-01-29 DIAGNOSIS — G8929 Other chronic pain: Secondary | ICD-10-CM

## 2020-01-29 DIAGNOSIS — M48062 Spinal stenosis, lumbar region with neurogenic claudication: Secondary | ICD-10-CM

## 2020-01-29 DIAGNOSIS — I10 Essential (primary) hypertension: Secondary | ICD-10-CM

## 2020-01-29 DIAGNOSIS — Z0000 Encounter for general adult medical examination without abnormal findings: Secondary | ICD-10-CM

## 2020-01-29 DIAGNOSIS — N1832 Chronic kidney disease, stage 3b: Secondary | ICD-10-CM | POA: Diagnosis not present

## 2020-01-29 DIAGNOSIS — M5030 Other cervical disc degeneration, unspecified cervical region: Secondary | ICD-10-CM

## 2020-01-29 DIAGNOSIS — E039 Hypothyroidism, unspecified: Secondary | ICD-10-CM | POA: Diagnosis not present

## 2020-01-29 DIAGNOSIS — E559 Vitamin D deficiency, unspecified: Secondary | ICD-10-CM

## 2020-01-29 DIAGNOSIS — D3A8 Other benign neuroendocrine tumors: Secondary | ICD-10-CM

## 2020-01-29 MED ORDER — TRAMADOL HCL 50 MG PO TABS: 50.0000 mg | ORAL_TABLET | Freq: Four times a day (QID) | ORAL | 1 refills | Status: AC | PRN

## 2020-01-29 NOTE — Progress Notes (Signed)
Subjective:  Patient ID: Katie [name], female    DOB: 1945/12/14  Age: 74 y.o. MRN: [id number]  CC: Annual Exam, Back Pain, Hyperlipidemia, Hypothyroidism, and Hypertension  This visit occurred during the SARS-CoV-2 public health emergency.  Safety protocols were in place, including screening questions prior to the visit, additional usage of staff PPE, and extensive cleaning of exam room while observing appropriate contact time as indicated for disinfecting solutions.    HPI Katie [name] presents for a CPX.   She complains of a several week history of right-sided midthoracic pain.  She denies any trauma or injury.  She has not taken anything to control the pain.  She feels the urge to burp and has trouble swallowing.  She denies cough, hemoptysis, nausea, vomiting, abdominal pain, loss of appetite, or weight loss.  She said she saw her endocrinologist 2 days prior to this visit and had extensive lab work done today.  I do not have any copies of those labs.  She request that no additional lab work be done today.  Outpatient Medications Prior to Visit  Medication Sig Dispense Refill  . ACCU-CHEK AVIVA PLUS test strip     . ALPRAZolam (XANAX) 0.5 MG tablet TAKE 1 TABLET BY MOUTH 3  TIMES DAILY AS NEEDED FOR  ANXIETY OR SLEEP 270 tablet 0  . aspirin EC 81 MG tablet Take 1 tablet (81 mg total) by mouth daily. 90 tablet 1  . BIOTIN 5000 PO Take by mouth.    . cyanocobalamin (,VITAMIN B-12,) 1000 MCG/ML injection INJECT 1 ML INTRAMUSCULARLY  ONCE EVERY MONTH 3 mL 0  . Digestive Enzymes (DIGESTIVE ENZYME PO) Take 1 capsule by mouth daily.    . Dulaglutide (TRULICITY) 1.5 PY/0.9XI SOPN Inject into the skin every 14 (fourteen) days.    . Ergocalciferol (VITAMIN D2 PO) Take by mouth.    . famotidine (PEPCID) 20 MG tablet Take 2 tablets (40 mg total) by mouth daily. 180 tablet 1  . gabapentin (NEURONTIN) 100 MG capsule Take 1 capsule (100 mg total) by mouth 3 (three) times daily. (Patient taking  differently: Take 100 mg by mouth 3 (three) times daily as needed. ) 90 capsule 5  . Lancet Devices (SIMPLE DIAGNOSTICS LANCING DEV) MISC     . levothyroxine (SYNTHROID, LEVOTHROID) 88 MCG tablet Take 88 mcg by mouth daily.      Marland Kitchen LINZESS 290 MCG CAPS capsule TAKE 1 CAPSULE BY MOUTH ONCE DAILY BEFORE BREAKFAST 30 capsule 5  . lisinopril (ZESTRIL) 10 MG tablet Take 1 tablet by mouth once daily 90 tablet 0  . rosuvastatin (CRESTOR) 10 MG tablet Take 10 mg by mouth daily.    . indapamide (LOZOL) 1.25 MG tablet Take 1 tablet (1.25 mg total) by mouth daily. 90 tablet 0   No facility-administered medications prior to visit.    ROS Review of Systems  Constitutional: Negative for appetite change, diaphoresis, fatigue and unexpected weight change.  HENT: Positive for trouble swallowing. Negative for sore throat and voice change.   Eyes: Negative for visual disturbance.  Respiratory: Negative for cough, chest tightness, shortness of breath and wheezing.   Cardiovascular: Negative for chest pain, palpitations and leg swelling.  Gastrointestinal: Negative for abdominal pain, blood in stool, constipation, diarrhea, nausea and vomiting.  Endocrine: Negative.   Genitourinary: Negative.  Negative for difficulty urinating.  Musculoskeletal: Positive for back pain and neck pain. Negative for arthralgias and myalgias.  Skin: Negative.  Negative for color change and pallor.  Neurological: Negative.  Negative  for dizziness, weakness, light-headedness and numbness.  Hematological: Negative for adenopathy. Does not bruise/bleed easily.  Psychiatric/Behavioral: Positive for confusion and decreased concentration. Negative for sleep disturbance. The patient is not nervous/anxious.     Objective:  BP 126/84   Pulse 85   Temp 98.6 F (37 C) (Oral)   Ht 5' 4.25" (1.632 m)   Wt 174 lb (78.9 kg)   SpO2 97%   BMI 29.64 kg/m   BP Readings from Last 3 Encounters:  01/29/20 126/84  12/16/19 110/80  10/30/19  (!) 152/82    Wt Readings from Last 3 Encounters:  01/29/20 174 lb (78.9 kg)  12/16/19 175 lb 2 oz (79.4 kg)  10/30/19 172 lb (78 kg)    Physical Exam Vitals reviewed.  Constitutional:      Appearance: Normal appearance. She is not ill-appearing.  HENT:     Nose: Nose normal.     Mouth/Throat:     Mouth: Mucous membranes are moist.  Eyes:     General: No scleral icterus.    Conjunctiva/sclera: Conjunctivae normal.  Cardiovascular:     Rate and Rhythm: Normal rate and regular rhythm.     Heart sounds: No murmur heard.   Pulmonary:     Effort: Pulmonary effort is normal.     Breath sounds: No stridor. No wheezing, rhonchi or rales.  Abdominal:     General: Abdomen is flat.     Palpations: There is no mass.     Tenderness: There is no abdominal tenderness. There is no guarding.  Musculoskeletal:     Cervical back: Normal and neck supple.     Thoracic back: Bony tenderness present. No edema, deformity, signs of trauma or tenderness. Normal range of motion.     Lumbar back: Normal. Normal range of motion.  Lymphadenopathy:     Cervical: No cervical adenopathy.  Neurological:     Mental Status: She is alert.     Lab Results  Component Value Date   WBC 4.8 05/01/2019   HGB 12.6 05/01/2019   HCT 38.1 05/01/2019   PLT 233.0 05/01/2019   GLUCOSE 100 (H) 10/30/2019   CHOL 113 12/30/2018   TRIG 71.0 12/30/2018   HDL 64.50 12/30/2018   LDLDIRECT 105.9 07/04/2007   LDLCALC 34 12/30/2018   ALT 19 12/30/2018   AST 25 12/30/2018   NA 138 10/30/2019   K 3.9 10/30/2019   CL 101 10/30/2019   CREATININE 1.37 (H) 10/30/2019   BUN 17 10/30/2019   CO2 30 10/30/2019   TSH 1.48 10/30/2019   HGBA1C 5.9 10/30/2019   MICROALBUR <0.7 12/30/2018    CT Abdomen Pelvis W Contrast  Result Date: 07/23/2019 CLINICAL DATA:  Worsening abdominal pain. Early satiety. Chronic constipation. EXAM: CT ABDOMEN AND PELVIS WITH CONTRAST TECHNIQUE: Multidetector CT imaging of the abdomen and  pelvis was performed using the standard protocol following bolus administration of intravenous contrast. CONTRAST:  83mL OMNIPAQUE IOHEXOL 300 MG/ML  SOLN COMPARISON:  CT abdomen 06/06/2011 FINDINGS: Lower chest: Lung bases are clear. Hepatobiliary: No focal hepatic lesion. Multiple gallstones noted. No gallbladder inflammation. Pancreas: Pancreas is normal. No ductal dilatation. No pancreatic inflammation. Spleen: Normal spleen Adrenals/urinary tract: Adrenal glands and kidneys are normal. The ureters and bladder normal. Stomach/Bowel: Stomach, small bowel, appendix, and cecum are normal. The colon and rectosigmoid colon are normal. Vascular/Lymphatic: Abdominal aorta is normal caliber. No periportal or retroperitoneal adenopathy. No pelvic adenopathy. Reproductive: Post hysterectomy Other: No free fluid. Musculoskeletal: No aggressive osseous lesion. Chronic endplate compression  deformity at L2. IMPRESSION: 1. No acute abdominopelvic findings. 2. No abdominal wall hernia. 3. Post hysterectomy Electronically Signed   By: Suzy Bouchard M.D.   On: 07/23/2019 12:22    DG Thoracic Spine W/Swimmers  Result Date: 01/29/2020 CLINICAL DATA:  Midline back pain EXAM: THORACIC SPINE - 3 VIEWS COMPARISON:  March 22, 2010 FINDINGS: There is no evidence of acute thoracic spine fracture. Unchanged minimal wedging of the inferior thoracic spine. There is dextrocurvature of the thoracic spine centered in the midthoracic spine. No spondylolisthesis. Limited assessment of the cervicothoracic junction secondary to overlying soft tissues. Multilevel endplate proliferative changes and disc space height loss most pronounced at the thoracolumbar junction and lower cervical spine. Chronic L2 deformity. Surgical clips project over the LEFT upper quadrant. IMPRESSION: 1. No acute osseous abnormality. 2. Dextrocurvature of the thoracic spine. Electronically Signed   By: Valentino Saxon MD   On: 01/29/2020 11:00    Assessment &  Plan:   Katie [name] was seen today for annual exam, back pain, hyperlipidemia, hypothyroidism and hypertension.  Diagnoses and all orders for this visit:  Essential hypertension- Her BP is well controlled. I have requested her recent labs from ENDO.  Acquired hypothyroidism- She appears euthyroid.  Stage 3b chronic kidney disease- I await her recent labs. She will avoid nsaids.  Prediabetes- Per ENDO  Routine general medical examination at a health care facility- Exam completed, labs requested from ENDO, cancer screenings are UTD, she deferred on a flu vaccine.  Vitamin D deficiency  Hyperlipidemia with target LDL less than 100- Will review her labs when available.  B12 deficiency  Chronic thoracic spine pain- Will treat with tramadol. Will avoid nsaids. -     DG Thoracic Spine W/Swimmers; Future -     traMADol (ULTRAM) 50 MG tablet; Take 1 tablet (50 mg total) by mouth every 6 (six) hours as needed for up to 5 days.  Spinal stenosis, lumbar region with neurogenic claudication -     traMADol (ULTRAM) 50 MG tablet; Take 1 tablet (50 mg total) by mouth every 6 (six) hours as needed for up to 5 days.  Degeneration of cervical intervertebral disc -     traMADol (ULTRAM) 50 MG tablet; Take 1 tablet (50 mg total) by mouth every 6 (six) hours as needed for up to 5 days.  Neuroendocrine neoplasm of gastrointestinal tract -     Ambulatory referral to Gastroenterology   I am having Katie [name] start on traMADol. I am also having her maintain her levothyroxine, Accu-Chek Aviva Plus, Simple Diagnostics Lancing Dev, rosuvastatin, aspirin EC, Trulicity, BIOTIN 1610 PO, Ergocalciferol (VITAMIN D2 PO), gabapentin, Digestive Enzymes (DIGESTIVE ENZYME PO), famotidine, ALPRAZolam, Linzess, cyanocobalamin, and lisinopril.  Meds ordered this encounter  Medications  . traMADol (ULTRAM) 50 MG tablet    Sig: Take 1 tablet (50 mg total) by mouth every 6 (six) hours as needed for up to 5 days.    Dispense:   270 tablet    Refill:  1   In addition to time spent on CPE, I spent 50 minutes in preparing to see the patient by review of recent labs, imaging and procedures, obtaining and reviewing separately obtained history, communicating with the patient and family or caregiver, ordering medications, tests or procedures, and documenting clinical information in the EHR including the differential Dx, treatment, and any further evaluation and other management of 1. Essential hypertension 2. Acquired hypothyroidism 3. Stage 3b chronic kidney disease 4. Prediabetes 5. Vitamin D deficiency 6. Hyperlipidemia with  target LDL less than 100 7. B12 deficiency 8. Chronic thoracic spine pain 9. Spinal stenosis, lumbar region with neurogenic claudication 10. Degeneration of cervical intervertebral disc 11. Neuroendocrine neoplasm of gastrointestinal tract     Follow-up: Return in about 3 months (around 04/29/2020).  Scarlette Calico, MD

## 2020-01-29 NOTE — Patient Instructions (Signed)
Health Maintenance, Female Adopting a healthy lifestyle and getting preventive care are important in promoting health and wellness. Ask your health care provider about:  The right schedule for you to have regular tests and exams.  Things you can do on your own to prevent diseases and keep yourself healthy. What should I know about diet, weight, and exercise? Eat a healthy diet   Eat a diet that includes plenty of vegetables, fruits, low-fat dairy products, and lean protein.  Do not eat a lot of foods that are high in solid fats, added sugars, or sodium. Maintain a healthy weight Body mass index (BMI) is used to identify weight problems. It estimates body fat based on height and weight. Your health care provider can help determine your BMI and help you achieve or maintain a healthy weight. Get regular exercise Get regular exercise. This is one of the most important things you can do for your health. Most adults should:  Exercise for at least 150 minutes each week. The exercise should increase your heart rate and make you sweat (moderate-intensity exercise).  Do strengthening exercises at least twice a week. This is in addition to the moderate-intensity exercise.  Spend less time sitting. Even light physical activity can be beneficial. Watch cholesterol and blood lipids Have your blood tested for lipids and cholesterol at 75 years of age, then have this test every 5 years. Have your cholesterol levels checked more often if:  Your lipid or cholesterol levels are high.  You are older than 75 years of age.  You are at high risk for heart disease. What should I know about cancer screening? Depending on your health history and family history, you may need to have cancer screening at various ages. This may include screening for:  Breast cancer.  Cervical cancer.  Colorectal cancer.  Skin cancer.  Lung cancer. What should I know about heart disease, diabetes, and high blood  pressure? Blood pressure and heart disease  High blood pressure causes heart disease and increases the risk of stroke. This is more likely to develop in people who have high blood pressure readings, are of African descent, or are overweight.  Have your blood pressure checked: ? Every 3-5 years if you are 18-39 years of age. ? Every year if you are 40 years old or older. Diabetes Have regular diabetes screenings. This checks your fasting blood sugar level. Have the screening done:  Once every three years after age 40 if you are at a normal weight and have a low risk for diabetes.  More often and at a younger age if you are overweight or have a high risk for diabetes. What should I know about preventing infection? Hepatitis B If you have a higher risk for hepatitis B, you should be screened for this virus. Talk with your health care provider to find out if you are at risk for hepatitis B infection. Hepatitis C Testing is recommended for:  Everyone born from 1945 through 1965.  Anyone with known risk factors for hepatitis C. Sexually transmitted infections (STIs)  Get screened for STIs, including gonorrhea and chlamydia, if: ? You are sexually active and are younger than 75 years of age. ? You are older than 75 years of age and your health care provider tells you that you are at risk for this type of infection. ? Your sexual activity has changed since you were last screened, and you are at increased risk for chlamydia or gonorrhea. Ask your health care provider if   you are at risk.  Ask your health care provider about whether you are at high risk for HIV. Your health care provider may recommend a prescription medicine to help prevent HIV infection. If you choose to take medicine to prevent HIV, you should first get tested for HIV. You should then be tested every 3 months for as long as you are taking the medicine. Pregnancy  If you are about to stop having your period (premenopausal) and  you may become pregnant, seek counseling before you get pregnant.  Take 400 to 800 micrograms (mcg) of folic acid every day if you become pregnant.  Ask for birth control (contraception) if you want to prevent pregnancy. Osteoporosis and menopause Osteoporosis is a disease in which the bones lose minerals and strength with aging. This can result in bone fractures. If you are 65 years old or older, or if you are at risk for osteoporosis and fractures, ask your health care provider if you should:  Be screened for bone loss.  Take a calcium or vitamin D supplement to lower your risk of fractures.  Be given hormone replacement therapy (HRT) to treat symptoms of menopause. Follow these instructions at home: Lifestyle  Do not use any products that contain nicotine or tobacco, such as cigarettes, e-cigarettes, and chewing tobacco. If you need help quitting, ask your health care provider.  Do not use street drugs.  Do not share needles.  Ask your health care provider for help if you need support or information about quitting drugs. Alcohol use  Do not drink alcohol if: ? Your health care provider tells you not to drink. ? You are pregnant, may be pregnant, or are planning to become pregnant.  If you drink alcohol: ? Limit how much you use to 0-1 drink a day. ? Limit intake if you are breastfeeding.  Be aware of how much alcohol is in your drink. In the [place] one drink equals one 12 oz bottle of beer (355 mL), one 5 oz glass of wine (148 mL), or one 1 oz glass of hard liquor (44 mL). General instructions  Schedule regular health, dental, and eye exams.  Stay current with your vaccines.  Tell your health care provider if: ? You often feel depressed. ? You have ever been abused or do not feel safe at home. Summary  Adopting a healthy lifestyle and getting preventive care are important in promoting health and wellness.  Follow your health care provider's instructions about healthy  diet, exercising, and getting tested or screened for diseases.  Follow your health care provider's instructions on monitoring your cholesterol and blood pressure. This information is not intended to replace advice given to you by your health care provider. Make sure you discuss any questions you have with your health care provider. Document Revised: 04/24/2018 Document Reviewed: 04/24/2018 Elsevier Patient Education  2020 [employer].  

## 2020-01-30 ENCOUNTER — Telehealth: Payer: Self-pay | Admitting: Gastroenterology

## 2020-01-30 NOTE — Telephone Encounter (Signed)
Patient states she did not tolerate Amitiza in the past but her insurance no longer covers Linzess and it was $130.  Asked patient if her pharmacy told her why Linzess was not covered since she has not had a change in her insurance. Patient states it could be that she has to pay her way out of the donut hole. Informed patient that unfortunately that we can switch her to another medication but the price will still be the same if she is in the donut hole with her insurance. Informed patient to contact her insurance company and find out what is the preferred medication in that category with them and why Linzess is no longer covered. Patient agreed and will call our office back.

## 2020-01-30 NOTE — Telephone Encounter (Signed)
Pt states that her insurance does not cover Linzess, she would like another similar med.

## 2020-02-01 ENCOUNTER — Other Ambulatory Visit: Payer: Self-pay | Admitting: Internal Medicine

## 2020-02-01 DIAGNOSIS — I10 Essential (primary) hypertension: Secondary | ICD-10-CM

## 2020-03-15 ENCOUNTER — Other Ambulatory Visit: Payer: Self-pay | Admitting: Internal Medicine

## 2020-03-15 DIAGNOSIS — F411 Generalized anxiety disorder: Secondary | ICD-10-CM

## 2020-03-16 ENCOUNTER — Ambulatory Visit: Payer: Medicare Other | Admitting: Gastroenterology

## 2020-03-16 ENCOUNTER — Encounter: Payer: Self-pay | Admitting: Gastroenterology

## 2020-03-16 VITALS — BP 110/70 | HR 76 | Ht 64.25 in | Wt 170.6 lb

## 2020-03-16 DIAGNOSIS — K5904 Chronic idiopathic constipation: Secondary | ICD-10-CM | POA: Diagnosis not present

## 2020-03-16 DIAGNOSIS — K31A0 Gastric intestinal metaplasia, unspecified: Secondary | ICD-10-CM

## 2020-03-16 DIAGNOSIS — Z86012 Personal history of benign carcinoid tumor: Secondary | ICD-10-CM | POA: Diagnosis not present

## 2020-03-16 NOTE — Progress Notes (Signed)
    History of Present Illness: This is a 74 year old female returning for follow-up of constipation, bloating.  She is not able to afford Linzess and has been taking MiraLAX twice daily without adequate results.  She takes magnesium citrate about once per week which works well.  She notes gas and bloating is a persistent problem.  Both problems are better after she has had a complete bowel movement.  Current Medications, Allergies, Past Medical History, Past Surgical History, Family History and Social History were reviewed in [employer] record.  Physical Exam: General: Well developed, well nourished, no acute distress Head: Normocephalic and atraumatic Eyes:  sclerae anicteric, EOMI Ears: Normal auditory acuity Mouth: Not examined, mask on during Covid-19 pandemic Lungs: Clear throughout to auscultation Heart: Regular rate and rhythm; no murmurs, rubs or bruits Abdomen: Soft, non tender and non distended. No masses, hepatosplenomegaly or hernias noted. Normal Bowel sounds Rectal: Not done Musculoskeletal: Symmetrical with no gross deformities  Pulses:  Normal pulses noted Extremities: No clubbing, cyanosis, edema or deformities noted Neurological: Alert oriented x 4, grossly nonfocal Psychological:  Alert and cooperative. Normal mood and affect   Assessment and Recommendations:  1. CIC.  Samples of Linzess 290 mcg qd supplied.  Linzess 290 mcg daily and Miralax qd has been effective and we will continue this regimen.   2.  Bloating, gas.  Management of constipation as above.  Begin Gaviscon 3 times daily or Gas-X 3 times daily or their generic equivalents.  3.  Gastric NET, gastric intestinal metaplasia, atrophic gastritis. EUS and EMR of site of gastric NET removal with no residual NET noted.  Schedule EGD for NET and GIM surveillance with gastric mapping. The risks (including bleeding, perforation, infection, missed lesions, medication reactions and possible  hospitalization or surgery if complications occur), benefits, and alternatives to endoscopy with possible biopsy and possible dilation were discussed with the patient and they consent to proceed.   4. GERD. Inlet patch with Barrett's esophagus without dysplasia.  Continue famotidine 20 mg twice daily.  Follow antireflux measures.  EGD for surveillance of Barrett's as above.

## 2020-03-16 NOTE — Patient Instructions (Signed)
You have been given some Lizess 290 mcg samples until you can get your prescription.   Start over the counter Gaviscon or Gas-x as needed for gas and bloating.   It has been recommended to you by your physician that you have a(n) Upper Endoscopy completed. Per your request, we did not schedule the procedure(s) today. Please contact our office at [phone] when our January schedule becomes available. You will be scheduled for a pre-visit and procedure at that time.   Thank you for choosing me and [facility].  Pricilla Riffle. Dagoberto Ligas., MD., [facility] Regal

## 2020-03-27 ENCOUNTER — Ambulatory Visit: Payer: Medicare Other | Attending: Internal Medicine

## 2020-03-27 DIAGNOSIS — Z23 Encounter for immunization: Secondary | ICD-10-CM

## 2020-03-27 NOTE — Progress Notes (Signed)
   Covid-19 Vaccination Clinic  Name:  Anila Bojarski    MRN: [id number] DOB: January 23, 1946  03/27/2020  Ms. San was observed post Covid-19 immunization for 15 minutes without incident. She was provided with Vaccine Information Sheet and instruction to access the V-Safe system.   Ms. Arps was instructed to call 911 with any severe reactions post vaccine: Marland Kitchen Difficulty breathing  . Swelling of face and throat  . A fast heartbeat  . A bad rash all over body  . Dizziness and weakness

## 2020-04-10 ENCOUNTER — Other Ambulatory Visit: Payer: Self-pay | Admitting: Internal Medicine

## 2020-04-12 ENCOUNTER — Other Ambulatory Visit: Payer: Self-pay | Admitting: Internal Medicine

## 2020-04-12 DIAGNOSIS — E538 Deficiency of other specified B group vitamins: Secondary | ICD-10-CM

## 2020-04-14 ENCOUNTER — Telehealth: Payer: Self-pay | Admitting: Internal Medicine

## 2020-04-14 DIAGNOSIS — E538 Deficiency of other specified B group vitamins: Secondary | ICD-10-CM

## 2020-04-14 NOTE — Telephone Encounter (Signed)
Patient dropped off a letter for a prescription for B12 that needs to be fax over to the office in letter. She goes to a different office to get them.

## 2020-04-29 NOTE — Telephone Encounter (Signed)
   Patient calling to request orders for B12 be sent to [facility]. She will continue to have B12 at that office Phone [phone] Fax [phone]

## 2020-04-30 NOTE — Telephone Encounter (Signed)
Orders faxed

## 2020-04-30 NOTE — Telephone Encounter (Signed)
Ok with me 

## 2020-05-09 ENCOUNTER — Other Ambulatory Visit: Payer: Self-pay | Admitting: Gastroenterology

## 2020-05-17 ENCOUNTER — Ambulatory Visit: Payer: Medicare Other | Admitting: Internal Medicine

## 2020-05-24 ENCOUNTER — Ambulatory Visit (INDEPENDENT_AMBULATORY_CARE_PROVIDER_SITE_OTHER): Payer: Medicare Other

## 2020-05-24 ENCOUNTER — Encounter: Payer: Self-pay | Admitting: Internal Medicine

## 2020-05-24 ENCOUNTER — Other Ambulatory Visit: Payer: Self-pay

## 2020-05-24 ENCOUNTER — Ambulatory Visit: Payer: Medicare Other | Admitting: Internal Medicine

## 2020-05-24 VITALS — BP 118/78 | Temp 98.5°F | Resp 16 | Ht 64.25 in | Wt 170.0 lb

## 2020-05-24 DIAGNOSIS — E785 Hyperlipidemia, unspecified: Secondary | ICD-10-CM

## 2020-05-24 DIAGNOSIS — R0789 Other chest pain: Secondary | ICD-10-CM | POA: Diagnosis not present

## 2020-05-24 DIAGNOSIS — Z1231 Encounter for screening mammogram for malignant neoplasm of breast: Secondary | ICD-10-CM

## 2020-05-24 DIAGNOSIS — R7303 Prediabetes: Secondary | ICD-10-CM | POA: Diagnosis not present

## 2020-05-24 DIAGNOSIS — Z0001 Encounter for general adult medical examination with abnormal findings: Secondary | ICD-10-CM | POA: Diagnosis not present

## 2020-05-24 DIAGNOSIS — E039 Hypothyroidism, unspecified: Secondary | ICD-10-CM | POA: Diagnosis not present

## 2020-05-24 DIAGNOSIS — K2100 Gastro-esophageal reflux disease with esophagitis, without bleeding: Secondary | ICD-10-CM

## 2020-05-24 DIAGNOSIS — E538 Deficiency of other specified B group vitamins: Secondary | ICD-10-CM

## 2020-05-24 DIAGNOSIS — I10 Essential (primary) hypertension: Secondary | ICD-10-CM | POA: Diagnosis not present

## 2020-05-24 DIAGNOSIS — E559 Vitamin D deficiency, unspecified: Secondary | ICD-10-CM

## 2020-05-24 DIAGNOSIS — J370 Chronic laryngitis: Secondary | ICD-10-CM | POA: Insufficient documentation

## 2020-05-24 DIAGNOSIS — N1832 Chronic kidney disease, stage 3b: Secondary | ICD-10-CM | POA: Diagnosis not present

## 2020-05-24 DIAGNOSIS — R9431 Abnormal electrocardiogram [ECG] [EKG]: Secondary | ICD-10-CM

## 2020-05-24 LAB — CBC WITH DIFFERENTIAL/PLATELET
Basophils Absolute: 0 10*3/uL (ref 0.0–0.1)
Basophils Relative: 0.7 % (ref 0.0–3.0)
Eosinophils Absolute: 0.2 10*3/uL (ref 0.0–0.7)
Eosinophils Relative: 3.1 % (ref 0.0–5.0)
HCT: 37.4 % (ref 36.0–46.0)
Hemoglobin: 12.7 g/dL (ref 12.0–15.0)
Lymphocytes Relative: 35.1 % (ref 12.0–46.0)
Lymphs Abs: 1.9 10*3/uL (ref 0.7–4.0)
MCHC: 33.9 g/dL (ref 30.0–36.0)
MCV: 75.2 fl — ABNORMAL LOW (ref 78.0–100.0)
Monocytes Absolute: 0.5 10*3/uL (ref 0.1–1.0)
Monocytes Relative: 9.9 % (ref 3.0–12.0)
Neutro Abs: 2.8 10*3/uL (ref 1.4–7.7)
Neutrophils Relative %: 51.2 % (ref 43.0–77.0)
Platelets: 191 10*3/uL (ref 150.0–400.0)
RBC: 4.98 Mil/uL (ref 3.87–5.11)
RDW: 15.7 % — ABNORMAL HIGH (ref 11.5–15.5)
WBC: 5.5 10*3/uL (ref 4.0–10.5)

## 2020-05-24 LAB — URINALYSIS, ROUTINE W REFLEX MICROSCOPIC
Bilirubin Urine: NEGATIVE
Hgb urine dipstick: NEGATIVE
Ketones, ur: NEGATIVE
Nitrite: NEGATIVE
Specific Gravity, Urine: 1.02 (ref 1.000–1.030)
Total Protein, Urine: NEGATIVE
Urine Glucose: NEGATIVE
Urobilinogen, UA: 0.2 (ref 0.0–1.0)
pH: 6 (ref 5.0–8.0)

## 2020-05-24 LAB — HEPATIC FUNCTION PANEL
ALT: 15 U/L (ref 0–35)
AST: 25 U/L (ref 0–37)
Albumin: 4.3 g/dL (ref 3.5–5.2)
Alkaline Phosphatase: 44 U/L (ref 39–117)
Bilirubin, Direct: 0.2 mg/dL (ref 0.0–0.3)
Total Bilirubin: 1 mg/dL (ref 0.2–1.2)
Total Protein: 7.6 g/dL (ref 6.0–8.3)

## 2020-05-24 LAB — BASIC METABOLIC PANEL
BUN: 23 mg/dL (ref 6–23)
CO2: 32 mEq/L (ref 19–32)
Calcium: 10 mg/dL (ref 8.4–10.5)
Chloride: 100 mEq/L (ref 96–112)
Creatinine, Ser: 1.65 mg/dL — ABNORMAL HIGH (ref 0.40–1.20)
GFR: 30.24 mL/min — ABNORMAL LOW (ref >60.00)
Glucose, Bld: 101 mg/dL — ABNORMAL HIGH (ref 70–99)
Potassium: 4.2 mEq/L (ref 3.5–5.1)
Sodium: 137 mEq/L (ref 135–145)

## 2020-05-24 LAB — HEMOGLOBIN A1C: Hgb A1c MFr Bld: 5.9 % (ref 4.6–6.5)

## 2020-05-24 LAB — TSH: TSH: 1.22 u[IU]/mL (ref 0.35–4.50)

## 2020-05-24 LAB — FOLATE: Folate: 12.7 ng/mL (ref >5.9)

## 2020-05-24 MED ORDER — FAMOTIDINE 20 MG PO TABS: 40.0000 mg | ORAL_TABLET | Freq: Every day | ORAL | 1 refills | Status: DC

## 2020-05-24 NOTE — Patient Instructions (Signed)
Health Maintenance, Female Adopting a healthy lifestyle and getting preventive care are important in promoting health and wellness. Ask your health care provider about:  The right schedule for you to have regular tests and exams.  Things you can do on your own to prevent diseases and keep yourself healthy. What should I know about diet, weight, and exercise? Eat a healthy diet  Eat a diet that includes plenty of vegetables, fruits, low-fat dairy products, and lean protein.  Do not eat a lot of foods that are high in solid fats, added sugars, or sodium.   Maintain a healthy weight Body mass index (BMI) is used to identify weight problems. It estimates body fat based on height and weight. Your health care provider can help determine your BMI and help you achieve or maintain a healthy weight. Get regular exercise Get regular exercise. This is one of the most important things you can do for your health. Most adults should:  Exercise for at least 150 minutes each week. The exercise should increase your heart rate and make you sweat (moderate-intensity exercise).  Do strengthening exercises at least twice a week. This is in addition to the moderate-intensity exercise.  Spend less time sitting. Even light physical activity can be beneficial. Watch cholesterol and blood lipids Have your blood tested for lipids and cholesterol at 76 years of age, then have this test every 5 years. Have your cholesterol levels checked more often if:  Your lipid or cholesterol levels are high.  You are older than 76 years of age.  You are at high risk for heart disease. What should I know about cancer screening? Depending on your health history and family history, you may need to have cancer screening at various ages. This may include screening for:  Breast cancer.  Cervical cancer.  Colorectal cancer.  Skin cancer.  Lung cancer. What should I know about heart disease, diabetes, and high blood  pressure? Blood pressure and heart disease  High blood pressure causes heart disease and increases the risk of stroke. This is more likely to develop in people who have high blood pressure readings, are of African descent, or are overweight.  Have your blood pressure checked: ? Every 3-5 years if you are 18-39 years of age. ? Every year if you are 40 years old or older. Diabetes Have regular diabetes screenings. This checks your fasting blood sugar level. Have the screening done:  Once every three years after age 40 if you are at a normal weight and have a low risk for diabetes.  More often and at a younger age if you are overweight or have a high risk for diabetes. What should I know about preventing infection? Hepatitis B If you have a higher risk for hepatitis B, you should be screened for this virus. Talk with your health care provider to find out if you are at risk for hepatitis B infection. Hepatitis C Testing is recommended for:  Everyone born from 1945 through 1965.  Anyone with known risk factors for hepatitis C. Sexually transmitted infections (STIs)  Get screened for STIs, including gonorrhea and chlamydia, if: ? You are sexually active and are younger than 76 years of age. ? You are older than 76 years of age and your health care provider tells you that you are at risk for this type of infection. ? Your sexual activity has changed since you were last screened, and you are at increased risk for chlamydia or gonorrhea. Ask your health care provider   if you are at risk.  Ask your health care provider about whether you are at high risk for HIV. Your health care provider may recommend a prescription medicine to help prevent HIV infection. If you choose to take medicine to prevent HIV, you should first get tested for HIV. You should then be tested every 3 months for as long as you are taking the medicine. Pregnancy  If you are about to stop having your period (premenopausal) and  you may become pregnant, seek counseling before you get pregnant.  Take 400 to 800 micrograms (mcg) of folic acid every day if you become pregnant.  Ask for birth control (contraception) if you want to prevent pregnancy. Osteoporosis and menopause Osteoporosis is a disease in which the bones lose minerals and strength with aging. This can result in bone fractures. If you are 65 years old or older, or if you are at risk for osteoporosis and fractures, ask your health care provider if you should:  Be screened for bone loss.  Take a calcium or vitamin D supplement to lower your risk of fractures.  Be given hormone replacement therapy (HRT) to treat symptoms of menopause. Follow these instructions at home: Lifestyle  Do not use any products that contain nicotine or tobacco, such as cigarettes, e-cigarettes, and chewing tobacco. If you need help quitting, ask your health care provider.  Do not use street drugs.  Do not share needles.  Ask your health care provider for help if you need support or information about quitting drugs. Alcohol use  Do not drink alcohol if: ? Your health care provider tells you not to drink. ? You are pregnant, may be pregnant, or are planning to become pregnant.  If you drink alcohol: ? Limit how much you use to 0-1 drink a day. ? Limit intake if you are breastfeeding.  Be aware of how much alcohol is in your drink. In the [place] one drink equals one 12 oz bottle of beer (355 mL), one 5 oz glass of wine (148 mL), or one 1 oz glass of hard liquor (44 mL). General instructions  Schedule regular health, dental, and eye exams.  Stay current with your vaccines.  Tell your health care provider if: ? You often feel depressed. ? You have ever been abused or do not feel safe at home. Summary  Adopting a healthy lifestyle and getting preventive care are important in promoting health and wellness.  Follow your health care provider's instructions about healthy  diet, exercising, and getting tested or screened for diseases.  Follow your health care provider's instructions on monitoring your cholesterol and blood pressure. This information is not intended to replace advice given to you by your health care provider. Make sure you discuss any questions you have with your health care provider. Document Revised: 04/24/2018 Document Reviewed: 04/24/2018 Elsevier Patient Education  2021 [employer] Inc.  

## 2020-05-24 NOTE — Progress Notes (Signed)
Subjective:  Patient ID: Katie [name], female    DOB: 10-08-1945  Age: 75 y.o. MRN: [id number]  CC: Annual Exam, Hypothyroidism, Hyperlipidemia, and Diabetes  This visit occurred during the SARS-CoV-2 public health emergency.  Safety protocols were in place, including screening questions prior to the visit, additional usage of staff PPE, and extensive cleaning of exam room while observing appropriate contact time as indicated for disinfecting solutions.    HPI Katie [name] presents for a CPX.  She complains of chronic pain around Katie [name] right shoulder blade.  She also complains of chronic laryngitis and throat clearing.  She tells me she has seen GI but she wants to see an ENT doctor for a 2nd opinion.  She also wants to have Katie [name] heart evaluated.  She denies precordial chest pain, shortness of breath, diaphoresis, dizziness, lightheadedness, or palpitations.  Outpatient Medications Prior to Visit  Medication Sig Dispense Refill  . ACCU-CHEK AVIVA PLUS test strip     . ALPRAZolam (XANAX) 0.5 MG tablet TAKE 1 TABLET BY MOUTH 3  TIMES DAILY AS NEEDED FOR  ANXIETY OR SLEEP 270 tablet 1  . aspirin EC 81 MG tablet Take 1 tablet (81 mg total) by mouth daily. 90 tablet 1  . BIOTIN 5000 PO Take by mouth.    . cyanocobalamin (,VITAMIN B-12,) 1000 MCG/ML injection INJECT 1 ML INTRAMUSCULARLY  ONCE EVERY MONTH 3 mL 0  . Digestive Enzymes (DIGESTIVE ENZYME PO) Take 1 capsule by mouth daily.    . Dulaglutide (TRULICITY) 1.5 JX/9.1YN SOPN Inject into the skin every 14 (fourteen) days.    . Ergocalciferol (VITAMIN D2 PO) Take by mouth.    . gabapentin (NEURONTIN) 100 MG capsule Take 1 capsule (100 mg total) by mouth 3 (three) times daily. (Patient taking differently: Take 100 mg by mouth 3 (three) times daily as needed.) 90 capsule 5  . indapamide (LOZOL) 1.25 MG tablet Take 1 tablet by mouth once daily 90 tablet 1  . Lancet Devices (SIMPLE DIAGNOSTICS LANCING DEV) MISC     . levothyroxine (SYNTHROID,  LEVOTHROID) 88 MCG tablet Take 88 mcg by mouth daily.    Marland Kitchen LINZESS 290 MCG CAPS capsule TAKE 1 CAPSULE BY MOUTH ONCE DAILY BEFORE BREAKFAST 30 capsule 5  . lisinopril (ZESTRIL) 10 MG tablet Take 1 tablet by mouth once daily 90 tablet 0  . rosuvastatin (CRESTOR) 10 MG tablet Take 10 mg by mouth daily.    . famotidine (PEPCID) 20 MG tablet Take 2 tablets by mouth once daily 180 tablet 0   No facility-administered medications prior to visit.    ROS Review of Systems  Constitutional: Negative.  Negative for appetite change, diaphoresis, fatigue, fever and unexpected weight change.  HENT: Positive for voice change. Negative for trouble swallowing.   Eyes: Negative.   Respiratory: Negative for cough, chest tightness, shortness of breath and wheezing.   Cardiovascular: Positive for chest pain. Negative for palpitations and leg swelling.  Gastrointestinal: Negative for abdominal pain, constipation, nausea and vomiting.  Endocrine: Negative.   Genitourinary: Negative.  Negative for difficulty urinating and dysuria.  Musculoskeletal: Negative.  Negative for arthralgias and myalgias.  Skin: Negative.  Negative for color change and rash.  Neurological: Negative.  Negative for dizziness, weakness, light-headedness, numbness and headaches.  Hematological: Negative for adenopathy. Does not bruise/bleed easily.  Psychiatric/Behavioral: Negative for decreased concentration and dysphoric mood. The patient is nervous/anxious.     Objective:  BP 118/78   Temp 98.5 F (36.9 C) (Oral)   Resp 16  Ht 5' 4.25" (1.632 m)   Wt 170 lb (77.1 kg)   SpO2 98%   BMI 28.95 kg/m   BP Readings from Last 3 Encounters:  05/24/20 118/78  03/16/20 110/70  01/29/20 126/84    Wt Readings from Last 3 Encounters:  05/24/20 170 lb (77.1 kg)  03/16/20 170 lb 9.6 oz (77.4 kg)  01/29/20 174 lb (78.9 kg)    Physical Exam Vitals reviewed.  Constitutional:      Appearance: Normal appearance.  HENT:     Nose:  Nose normal.     Mouth/Throat:     Mouth: Mucous membranes are moist.  Eyes:     General: No scleral icterus.    Conjunctiva/sclera: Conjunctivae normal.  Cardiovascular:     Rate and Rhythm: Normal rate and regular rhythm.     Pulses:          Carotid pulses are 1+ on the right side and 1+ on the left side.      Radial pulses are 1+ on the right side and 1+ on the left side.       Femoral pulses are 1+ on the right side and 1+ on the left side.      Popliteal pulses are 1+ on the right side and 1+ on the left side.       Dorsalis pedis pulses are 1+ on the right side and 1+ on the left side.       Posterior tibial pulses are 1+ on the right side and 1+ on the left side.     Heart sounds: Normal heart sounds, S1 normal and S2 normal. No friction rub.     Comments: EKG- NSR, 85 bpm Minimal LVH TWI in lead III and V4-V6. Pulmonary:     Effort: Pulmonary effort is normal.     Breath sounds: No stridor. No wheezing, rhonchi or rales.  Chest:     Chest wall: No mass, tenderness, crepitus or edema.  Abdominal:     General: Abdomen is flat. Bowel sounds are normal. There is no distension.     Palpations: Abdomen is soft. There is no hepatomegaly, splenomegaly or mass.     Tenderness: There is no abdominal tenderness.     Hernia: No hernia is present.  Musculoskeletal:     Cervical back: Neck supple.     Right lower leg: No edema.     Left lower leg: No edema.  Skin:    General: Skin is warm and dry.     Coloration: Skin is not pale.  Neurological:     General: No focal deficit present.     Mental Status: She is alert.  Psychiatric:        Mood and Affect: Mood normal.        Behavior: Behavior normal.     Lab Results  Component Value Date   WBC 5.5 05/24/2020   HGB 12.7 05/24/2020   HCT 37.4 05/24/2020   PLT 191.0 05/24/2020   GLUCOSE 101 (H) 05/24/2020   CHOL 128 05/24/2020   TRIG 100.0 05/24/2020   HDL 65.00 05/24/2020   LDLDIRECT 105.9 07/04/2007   LDLCALC 43  05/24/2020   ALT 15 05/24/2020   AST 25 05/24/2020   NA 137 05/24/2020   K 4.2 05/24/2020   CL 100 05/24/2020   CREATININE 1.65 (H) 05/24/2020   BUN 23 05/24/2020   CO2 32 05/24/2020   TSH 1.22 05/24/2020   HGBA1C 5.9 05/24/2020   MICROALBUR <0.7 12/30/2018  CT Abdomen Pelvis W Contrast  Result Date: 07/23/2019 CLINICAL DATA:  Worsening abdominal pain. Early satiety. Chronic constipation. EXAM: CT ABDOMEN AND PELVIS WITH CONTRAST TECHNIQUE: Multidetector CT imaging of the abdomen and pelvis was performed using the standard protocol following bolus administration of intravenous contrast. CONTRAST:  29mL OMNIPAQUE IOHEXOL 300 MG/ML  SOLN COMPARISON:  CT abdomen 06/06/2011 FINDINGS: Lower chest: Lung bases are clear. Hepatobiliary: No focal hepatic lesion. Multiple gallstones noted. No gallbladder inflammation. Pancreas: Pancreas is normal. No ductal dilatation. No pancreatic inflammation. Spleen: Normal spleen Adrenals/urinary tract: Adrenal glands and kidneys are normal. The ureters and bladder normal. Stomach/Bowel: Stomach, small bowel, appendix, and cecum are normal. The colon and rectosigmoid colon are normal. Vascular/Lymphatic: Abdominal aorta is normal caliber. No periportal or retroperitoneal adenopathy. No pelvic adenopathy. Reproductive: Post hysterectomy Other: No free fluid. Musculoskeletal: No aggressive osseous lesion. Chronic endplate compression deformity at L2. IMPRESSION: 1. No acute abdominopelvic findings. 2. No abdominal wall hernia. 3. Post hysterectomy Electronically Signed   By: Suzy Bouchard M.D.   On: 07/23/2019 12:22   No results found.  Assessment & Plan:   Yamaira was seen today for annual exam, hypothyroidism, hyperlipidemia and diabetes.  Diagnoses and all orders for this visit:  Gastroesophageal reflux disease with esophagitis without hemorrhage- Will continue the H2 blocker. -     famotidine (PEPCID) 20 MG tablet; Take 2 tablets (40 mg total) by mouth  daily. -     CBC with Differential/Platelet; Future  Essential hypertension- Katie [name] blood pressure is adequately well controlled. -     Basic metabolic panel; Future -     EKG 12-Lead  Acquired hypothyroidism- Katie [name] TSH is in the normal range.  She will stay on the current dose of levothyroxine. -     TSH; Future  Stage 3b chronic kidney disease ([place])- Katie [name] renal function is stable. -     Basic metabolic panel; Future -     Urinalysis, Routine w reflex microscopic; Future  B12 deficiency- Katie [name] H&H and folate levels are normal now. -     Cancel: Vitamin B12; Future -     Folate; Future  Hyperlipidemia with target LDL less than 100- She has achieved Katie [name] LDL goal and is doing well on the statin. -     Lipid panel; Future -     Hepatic function panel; Future  Prediabetes -     Hemoglobin A1c; Future  Vitamin D deficiency  Encounter for general adult medical examination with abnormal findings- Exam completed, labs reviewed, vaccines reviewed and updated, cancer screenings addressed, patient education was given.  Chronic laryngitis -     Ambulatory referral to ENT  Right-sided chest wall pain- normal work-up. -     DG Chest 2 View; Future  Abnormal electrocardiogram (ECG) (EKG) -     Ambulatory referral to Cardiology   I have changed Katie [name]'s famotidine. I am also having Katie [name] maintain Katie [name] levothyroxine, Accu-Chek Aviva Plus, [employer] Lancing Dev, rosuvastatin, aspirin EC, Trulicity, BIOTIN 3086 PO, Ergocalciferol (VITAMIN D2 PO), gabapentin, Digestive Enzymes (DIGESTIVE ENZYME PO), Linzess, indapamide, ALPRAZolam, lisinopril, and cyanocobalamin.  Meds ordered this encounter  Medications  . famotidine (PEPCID) 20 MG tablet    Sig: Take 2 tablets (40 mg total) by mouth daily.    Dispense:  180 tablet    Refill:  1     Follow-up: Return in about 3 months (around 08/22/2020).  Scarlette Calico, MD

## 2020-05-25 LAB — LIPID PANEL
Cholesterol: 128 mg/dL (ref 0–200)
HDL: 65 mg/dL (ref >39.00)
LDL Cholesterol: 43 mg/dL (ref 0–99)
NonHDL: 62.74
Total CHOL/HDL Ratio: 2
Triglycerides: 100 mg/dL (ref 0.0–149.0)
VLDL: 20 mg/dL (ref 0.0–40.0)

## 2020-05-26 DIAGNOSIS — Z1231 Encounter for screening mammogram for malignant neoplasm of breast: Secondary | ICD-10-CM | POA: Insufficient documentation

## 2020-06-03 ENCOUNTER — Encounter: Payer: Self-pay | Admitting: Cardiology

## 2020-06-03 ENCOUNTER — Other Ambulatory Visit: Payer: Self-pay

## 2020-06-03 ENCOUNTER — Ambulatory Visit: Payer: Medicare Other | Admitting: Cardiology

## 2020-06-03 VITALS — BP 125/69 | HR 99 | Temp 97.7°F | Resp 16 | Ht 64.0 in | Wt 169.0 lb

## 2020-06-03 DIAGNOSIS — E039 Hypothyroidism, unspecified: Secondary | ICD-10-CM

## 2020-06-03 DIAGNOSIS — R9431 Abnormal electrocardiogram [ECG] [EKG]: Secondary | ICD-10-CM

## 2020-06-03 DIAGNOSIS — I10 Essential (primary) hypertension: Secondary | ICD-10-CM

## 2020-06-03 DIAGNOSIS — R7303 Prediabetes: Secondary | ICD-10-CM

## 2020-06-03 DIAGNOSIS — N1832 Chronic kidney disease, stage 3b: Secondary | ICD-10-CM

## 2020-06-03 DIAGNOSIS — E785 Hyperlipidemia, unspecified: Secondary | ICD-10-CM

## 2020-06-03 NOTE — Progress Notes (Signed)
Date:  06/03/2020   ID:  Katie [name], DOB Sep 29, 1945, MRN [id number]  PCP:  Janith Lima, MD  Cardiologist:  Rex Kras, DO, [facility] (established care 06/03/2020)  REASON FOR CONSULT: Abnormal EKG  REQUESTING PHYSICIAN:  Janith Lima, MD [address] [place],  [place] [address]  Chief Complaint  Patient presents with  . Abnormal ECG  . New Patient (Initial Visit)    HPI  Katie [name] is a 75 y.o. female who presents to the office with a chief complaint of "abnormal ekg." Patient's past medical history and cardiovascular risk factors include: History of DVT, GERD, hypertension chronic kidney disease stage IIIb, acquired hypothyroidism, hyperlipidemia, prediabetes, advanced age, postmenopausal female.  She is referred to the office at the request of Janith Lima, MD for evaluation of abnormal EKG.  Patient recently went to her PCP in January 2022 for a yearly physical.  An EKG was performed which noted normal sinus rhythm with T wave inversions in the inferolateral leads.  Therefore is referred to cardiology for further evaluation and management given her age and other chronic comorbid conditions.  Clinically patient denies any chest pain or shortness of breath at rest or with effort related activities.  FUNCTIONAL STATUS: Prior to the pandemic she would regularly go to gym. No structured exercise program or daily routine.   ALLERGIES: Allergies  Allergen Reactions  . Lisinopril Other (See Comments)    Cough and throat clearing  . Metoprolol Succinate     Unknown reaction  . Penicillins Swelling    Has patient had a PCN reaction causing immediate rash, facial/tongue/throat swelling, SOB or lightheadedness with hypotension: Yes Has patient had a PCN reaction causing severe rash involving mucus membranes or skin necrosis: No Has patient had a PCN reaction that required hospitalization: No Has patient had a PCN reaction occurring within the last 10 years: No If all of the  above answers are "NO", then may proceed with Cephalosporin use.   . Sulfamethoxazole   . Sulfamethoxazole-Trimethoprim     Unknown reaction  . Trimethoprim   . Metformin Nausea Only    MEDICATION LIST PRIOR TO VISIT: Current Meds  Medication Sig  . ACCU-CHEK AVIVA PLUS test strip   . ALPRAZolam (XANAX) 0.5 MG tablet TAKE 1 TABLET BY MOUTH 3  TIMES DAILY AS NEEDED FOR  ANXIETY OR SLEEP  . aspirin EC 81 MG tablet Take 1 tablet (81 mg total) by mouth daily.  Marland Kitchen BIOTIN 5000 PO Take by mouth.  . cyanocobalamin (,VITAMIN B-12,) 1000 MCG/ML injection INJECT 1 ML INTRAMUSCULARLY  ONCE EVERY MONTH  . Digestive Enzymes (DIGESTIVE ENZYME PO) Take 1 capsule by mouth daily.  . Dulaglutide (TRULICITY) 1.5 YP/9.5KD SOPN Inject into the skin every 14 (fourteen) days.  . Ergocalciferol (VITAMIN D2 PO) Take by mouth.  . famotidine (PEPCID) 20 MG tablet Take 2 tablets (40 mg total) by mouth daily.  Marland Kitchen gabapentin (NEURONTIN) 100 MG capsule Take 1 capsule (100 mg total) by mouth 3 (three) times daily. (Patient taking differently: Take 100 mg by mouth 3 (three) times daily as needed.)  . indapamide (LOZOL) 1.25 MG tablet Take 1 tablet by mouth once daily  . Lancet Devices (SIMPLE DIAGNOSTICS LANCING DEV) MISC   . levothyroxine (SYNTHROID, LEVOTHROID) 88 MCG tablet Take 88 mcg by mouth daily.  Marland Kitchen LINZESS 290 MCG CAPS capsule TAKE 1 CAPSULE BY MOUTH ONCE DAILY BEFORE BREAKFAST  . lisinopril (ZESTRIL) 10 MG tablet Take 1 tablet by mouth once daily  .  rosuvastatin (CRESTOR) 10 MG tablet Take 10 mg by mouth daily.     PAST MEDICAL HISTORY: Past Medical History:  Diagnosis Date  . Acute venous embolism and thrombosis of unspecified deep vessels of lower extremity 2001   RLE DVT and PE, coumadin x 10mo, now ASSA  . Anxiety state, unspecified   . Chronic idiopathic constipation 11/02/2016  . Degeneration of cervical intervertebral disc   . Gallstones   . History of DVT (deep vein thrombosis)   . Iron  deficiency anemia secondary to blood loss (chronic)   . Irritable bowel syndrome   . Lumbago   . Other B-complex deficiencies   . Personal history of colonic polyps    adenomatous  . Pure hypercholesterolemia   . Reflux esophagitis   . Tubular adenoma of colon 09/2002  . Type II or unspecified type diabetes mellitus without mention of complication, not stated as uncontrolled   . Unspecified essential hypertension   . Unspecified hypothyroidism   . Unspecified iridocyclitis   . Unspecified venous (peripheral) insufficiency   . Unspecified vitamin D deficiency     PAST SURGICAL HISTORY: Past Surgical History:  Procedure Laterality Date  . BIOPSY  04/24/2018   Procedure: BIOPSY;  Surgeon: Rush Landmark Telford Nab., MD;  Location: [facility];  Service: Gastroenterology;;  . CARPAL TUNNEL RELEASE    . CATARACT EXTRACTION Bilateral 08/29/2017,09/19/17  . ESOPHAGOGASTRODUODENOSCOPY (EGD) WITH PROPOFOL N/A 04/24/2018   Procedure: ESOPHAGOGASTRODUODENOSCOPY (EGD) WITH PROPOFOL;  Surgeon: Rush Landmark Telford Nab., MD;  Location: [facility];  Service: Gastroenterology;  Laterality: N/A;  with EMR  . FEMORAL HERNIA REPAIR    . OOPHORECTOMY    . POLYPECTOMY  04/24/2018   Procedure: POLYPECTOMY;  Surgeon: Mansouraty, Telford Nab., MD;  Location: [facility];  Service: Gastroenterology;;  . UPPER ESOPHAGEAL ENDOSCOPIC ULTRASOUND (EUS)  04/24/2018   Procedure: UPPER ESOPHAGEAL ENDOSCOPIC ULTRASOUND (EUS);  Surgeon: Rush Landmark Telford Nab., MD;  Location: [facility];  Service: Gastroenterology;;  . VESICOVAGINAL FISTULA CLOSURE W/ TAH      FAMILY HISTORY: The patient family history includes Deep vein thrombosis in her mother; Diabetes in her mother; Healthy in her sister; Hypertension in her mother; Prostate cancer in her father.  SOCIAL HISTORY:  The patient  reports that she has never smoked. She has never used smokeless tobacco. She reports current alcohol use. She reports that she does not  use drugs.  REVIEW OF SYSTEMS: Review of Systems  Constitutional: Negative for chills and fever.  HENT: Negative for hoarse voice and nosebleeds.   Eyes: Negative for discharge, double vision and pain.  Cardiovascular: Negative for chest pain, claudication, dyspnea on exertion, leg swelling, near-syncope, orthopnea, palpitations, paroxysmal nocturnal dyspnea and syncope.  Respiratory: Negative for hemoptysis and shortness of breath.   Musculoskeletal: Positive for back pain. Negative for muscle cramps and myalgias.  Gastrointestinal: Negative for abdominal pain, constipation, diarrhea, hematemesis, hematochezia, melena, nausea and vomiting.  Neurological: Negative for dizziness and light-headedness.    PHYSICAL EXAM: Vitals with BMI 06/03/2020 05/24/2020 03/16/2020  Height 5\' 4"  5' 4.25" 5' 4.25"  Weight 169 lbs 170 lbs 170 lbs 10 oz  BMI 28.99 85.46 27.03  Systolic 500 938 182  Diastolic 69 78 70  Pulse 99 - 76    CONSTITUTIONAL: Well-developed and well-nourished. No acute distress.  SKIN: Skin is warm and dry. No rash noted. No cyanosis. No pallor. No jaundice HEAD: Normocephalic and atraumatic.  EYES: No scleral icterus MOUTH/THROAT: Moist oral membranes.  NECK: No JVD present. No thyromegaly noted. No  carotid bruits  LYMPHATIC: No visible cervical adenopathy.  CHEST Normal respiratory effort. No intercostal retractions  LUNGS: Clear to auscultation bilaterally. No stridor. No wheezes. No rales.  CARDIOVASCULAR: Regular rate and rhythm, positive S1-S2, no murmurs rubs or gallops appreciated. ABDOMINAL: No apparent ascites.  EXTREMITIES: No peripheral edema  HEMATOLOGIC: No significant bruising NEUROLOGIC: Oriented to person, place, and time. Nonfocal. Normal muscle tone.  PSYCHIATRIC: Normal mood and affect. Normal behavior. Cooperative  CARDIAC DATABASE: EKG: 06/03/20: Normal sinus rhythm, 86 bpm, poor R wave progression, LVH per voltage criteria, ST-T changes in the lateral  leads, without underlying injury pattern.  Echocardiogram: No results found for this or any previous visit from the past 1095 days.  Stress Testing: No results found for this or any previous visit from the past 1095 days.  Heart Catheterization: None  LABORATORY DATA: CBC Latest Ref Rng & Units 05/24/2020 05/01/2019 12/30/2018  WBC 4.0 - 10.5 K/uL 5.5 4.8 5.3  Hemoglobin 12.0 - 15.0 g/dL 12.7 12.6 12.8  Hematocrit 36.0 - 46.0 % 37.4 38.1 37.4  Platelets 150.0 - 400.0 K/uL 191.0 233.0 200.0    CMP Latest Ref Rng & Units 05/24/2020 10/30/2019 07/08/2019  Glucose 70 - 99 mg/dL 101(H) 100(H) 121(H)  BUN 6 - 23 mg/dL 23 17 21   Creatinine 0.40 - 1.20 mg/dL 1.65(H) 1.37(H) 1.61(H)  Sodium 135 - 145 mEq/L 137 138 138  Potassium 3.5 - 5.1 mEq/L 4.2 3.9 4.5  Chloride 96 - 112 mEq/L 100 101 103  CO2 19 - 32 mEq/L 32 30 30  Calcium 8.4 - 10.5 mg/dL 10.0 10.2 9.9  Total Protein 6.0 - 8.3 g/dL 7.6 - -  Total Bilirubin 0.2 - 1.2 mg/dL 1.0 - -  Alkaline Phos 39 - 117 U/L 44 - -  AST 0 - 37 U/L 25 - -  ALT 0 - 35 U/L 15 - -    Lipid Panel     Component Value Date/Time   CHOL 128 05/24/2020 1148   TRIG 100.0 05/24/2020 1148   HDL 65.00 05/24/2020 1148   CHOLHDL 2 05/24/2020 1148   VLDL 20.0 05/24/2020 1148   LDLCALC 43 05/24/2020 1148   LDLDIRECT 105.9 07/04/2007 1033    No components found for: NTPROBNP No results for input(s): PROBNP in the last 8760 hours. Recent Labs    10/30/19 1003 05/24/20 1148  TSH 1.48 1.22    BMP Recent Labs    07/08/19 1440 10/30/19 1003 05/24/20 1148  NA 138 138 137  K 4.5 3.9 4.2  CL 103 101 100  CO2 30 30 32  GLUCOSE 121* 100* 101*  BUN 21 17 23   CREATININE 1.61* 1.37* 1.65*  CALCIUM 9.9 10.2 10.0    HEMOGLOBIN A1C Lab Results  Component Value Date   HGBA1C 5.9 05/24/2020    IMPRESSION:    ICD-10-CM   1. Abnormal electrocardiogram (ECG) (EKG)  R94.31 EKG 12-Lead  2. Essential hypertension  I10   3. Prediabetes  R73.03   4.  Chronic renal failure, stage 3b (HCC)  N18.32   5. Acquired hypothyroidism  E03.9   6. Hyperlipidemia  E78.5      RECOMMENDATIONS: Katie [name] is a 75 y.o. female whose past medical history and cardiac risk factors include: History of DVT, GERD, hypertension chronic kidney disease stage IIIb, acquired hypothyroidism, hyperlipidemia, diabetes, advanced age, postmenopausal female.  Abnormal EKG:  EKG at today's office visit notes normal sinus rhythm, LVH per voltage criteria, and ST-T changes in the lateral leads either due  to LVH or possible ischemia.  Patient has multiple cardiovascular risk factors including diabetes.  Given the LVH per voltage criteria would recommend exercise nuclear stress test to evaluate for reversible ischemia.  Echocardiogram will be ordered to evaluate for structural heart disease and left ventricular systolic function.  Diabetes mellitus:  Most recent hemoglobin A1c well controlled.  Currently on Trulicity.  Currently managed by her other providers.  Benign essential hypertension with chronic kidney disease stage IIIb:  Office blood pressure is very well controlled.  Medications reconciled.  Currently managed by primary care provider.  Hyperlipidemia:  Most recent lipid profile from 05/24/2020 independently reviewed with the patient.  Her LDL is less than 70 mg/dL, well controlled.  Continue statin therapy.  Currently managed by primary care provider.  FINAL MEDICATION LIST END OF ENCOUNTER: No orders of the defined types were placed in this encounter.   There are no discontinued medications.   Current Outpatient Medications:  .  ACCU-CHEK AVIVA PLUS test strip, , Disp: , Rfl:  .  ALPRAZolam (XANAX) 0.5 MG tablet, TAKE 1 TABLET BY MOUTH 3  TIMES DAILY AS NEEDED FOR  ANXIETY OR SLEEP, Disp: 270 tablet, Rfl: 1 .  aspirin EC 81 MG tablet, Take 1 tablet (81 mg total) by mouth daily., Disp: 90 tablet, Rfl: 1 .  BIOTIN 5000 PO, Take by mouth., Disp:  , Rfl:  .  cyanocobalamin (,VITAMIN B-12,) 1000 MCG/ML injection, INJECT 1 ML INTRAMUSCULARLY  ONCE EVERY MONTH, Disp: 3 mL, Rfl: 0 .  Digestive Enzymes (DIGESTIVE ENZYME PO), Take 1 capsule by mouth daily., Disp: , Rfl:  .  Dulaglutide (TRULICITY) 1.5 YC/1.4GY SOPN, Inject into the skin every 14 (fourteen) days., Disp: , Rfl:  .  Ergocalciferol (VITAMIN D2 PO), Take by mouth., Disp: , Rfl:  .  famotidine (PEPCID) 20 MG tablet, Take 2 tablets (40 mg total) by mouth daily., Disp: 180 tablet, Rfl: 1 .  gabapentin (NEURONTIN) 100 MG capsule, Take 1 capsule (100 mg total) by mouth 3 (three) times daily. (Patient taking differently: Take 100 mg by mouth 3 (three) times daily as needed.), Disp: 90 capsule, Rfl: 5 .  indapamide (LOZOL) 1.25 MG tablet, Take 1 tablet by mouth once daily, Disp: 90 tablet, Rfl: 1 .  Lancet Devices (SIMPLE DIAGNOSTICS LANCING DEV) MISC, , Disp: , Rfl:  .  levothyroxine (SYNTHROID, LEVOTHROID) 88 MCG tablet, Take 88 mcg by mouth daily., Disp: , Rfl:  .  LINZESS 290 MCG CAPS capsule, TAKE 1 CAPSULE BY MOUTH ONCE DAILY BEFORE BREAKFAST, Disp: 30 capsule, Rfl: 5 .  lisinopril (ZESTRIL) 10 MG tablet, Take 1 tablet by mouth once daily, Disp: 90 tablet, Rfl: 0 .  rosuvastatin (CRESTOR) 10 MG tablet, Take 10 mg by mouth daily., Disp: , Rfl:   Orders Placed This Encounter  Procedures  . EKG 12-Lead    There are no Patient Instructions on file for this visit.   --Continue cardiac medications as reconciled in final medication list. --Return in about 4 weeks (around 07/01/2020) for Review test results. Or sooner if needed. --Continue follow-up with your primary care physician regarding the management of your other chronic comorbid conditions.  Patient's questions and concerns were addressed to her satisfaction. She voices understanding of the instructions provided during this encounter.   This note was created using a voice recognition software as a result there may be grammatical  errors inadvertently enclosed that do not reflect the nature of this encounter. Every attempt is made to correct such errors.  Rex Kras, [place], [facility]  Pager: [phone] Office: [phone]

## 2020-06-09 ENCOUNTER — Other Ambulatory Visit: Payer: Medicare Other

## 2020-06-09 ENCOUNTER — Ambulatory Visit: Payer: Medicare Other

## 2020-06-09 ENCOUNTER — Other Ambulatory Visit: Payer: Self-pay

## 2020-06-09 DIAGNOSIS — R9431 Abnormal electrocardiogram [ECG] [EKG]: Secondary | ICD-10-CM

## 2020-06-21 ENCOUNTER — Other Ambulatory Visit (HOSPITAL_COMMUNITY)
Admission: RE | Admit: 2020-06-21 | Discharge: 2020-06-21 | Disposition: A | Discharge status: Home / Self Care | Payer: Medicare Other | Source: Ambulatory Visit | Attending: Cardiology | Admitting: Cardiology

## 2020-06-21 DIAGNOSIS — Z20822 Contact with and (suspected) exposure to covid-19: Secondary | ICD-10-CM | POA: Diagnosis not present

## 2020-06-21 DIAGNOSIS — Z01812 Encounter for preprocedural laboratory examination: Secondary | ICD-10-CM | POA: Diagnosis present

## 2020-06-22 LAB — SARS CORONAVIRUS 2 (TAT 6-24 HRS): SARS Coronavirus 2: NEGATIVE

## 2020-06-23 ENCOUNTER — Ambulatory Visit: Payer: Medicare Other

## 2020-06-23 ENCOUNTER — Other Ambulatory Visit: Payer: Self-pay

## 2020-06-23 DIAGNOSIS — E785 Hyperlipidemia, unspecified: Secondary | ICD-10-CM

## 2020-06-23 DIAGNOSIS — R7303 Prediabetes: Secondary | ICD-10-CM

## 2020-06-23 DIAGNOSIS — R9431 Abnormal electrocardiogram [ECG] [EKG]: Secondary | ICD-10-CM

## 2020-06-23 DIAGNOSIS — I10 Essential (primary) hypertension: Secondary | ICD-10-CM

## 2020-06-28 NOTE — Progress Notes (Signed)
No answer left a vm will try again later

## 2020-06-29 ENCOUNTER — Other Ambulatory Visit: Payer: Self-pay | Admitting: Internal Medicine

## 2020-06-29 DIAGNOSIS — E538 Deficiency of other specified B group vitamins: Secondary | ICD-10-CM

## 2020-06-29 NOTE — Progress Notes (Signed)
Called and spoke with patient regarding her echocardiogram results.

## 2020-07-02 ENCOUNTER — Encounter: Payer: Self-pay | Admitting: Cardiology

## 2020-07-02 ENCOUNTER — Other Ambulatory Visit: Payer: Self-pay

## 2020-07-02 ENCOUNTER — Ambulatory Visit: Payer: Medicare Other | Admitting: Cardiology

## 2020-07-02 VITALS — BP 107/56 | HR 84 | Temp 97.8°F | Resp 17 | Ht 64.0 in | Wt 165.0 lb

## 2020-07-02 DIAGNOSIS — I10 Essential (primary) hypertension: Secondary | ICD-10-CM

## 2020-07-02 DIAGNOSIS — I361 Nonrheumatic tricuspid (valve) insufficiency: Secondary | ICD-10-CM

## 2020-07-02 DIAGNOSIS — R9431 Abnormal electrocardiogram [ECG] [EKG]: Secondary | ICD-10-CM

## 2020-07-02 DIAGNOSIS — E785 Hyperlipidemia, unspecified: Secondary | ICD-10-CM

## 2020-07-02 DIAGNOSIS — R7303 Prediabetes: Secondary | ICD-10-CM

## 2020-07-02 DIAGNOSIS — Z712 Person consulting for explanation of examination or test findings: Secondary | ICD-10-CM

## 2020-07-02 NOTE — Progress Notes (Signed)
Date:  07/02/2020   ID:  Katie [name], DOB Sep 16, 1945, MRN [id number]  PCP:  Janith Lima, MD  Cardiologist:  Rex Kras, DO, [facility] (established care 06/03/2020)  Date: 07/02/20 Last Office Visit: 06/03/2020  Chief Complaint  Patient presents with  . Results    HPI  Katie [name] is a 75 y.o. female who presents to the office with a chief complaint of "review test results." Patient's past medical history and cardiovascular risk factors include: History of DVT, GERD, hypertension chronic kidney disease stage IIIb, acquired hypothyroidism, hyperlipidemia, prediabetes, advanced age, postmenopausal female.  She is referred to the office at the request of Janith Lima, MD for evaluation of abnormal EKG.  Patient recently went to her PCP in January 2022 for a yearly physical.  An EKG was performed which noted normal sinus rhythm with T wave inversions in the inferolateral leads.  Therefore is referred to cardiology for further evaluation and management given her age and other chronic comorbid conditions.  At the last office visit the shared decision was to proceed with an echocardiogram and stress test.  Echocardiogram noted preserved LVEF, grade 1 diastolic impairment, and moderate tricuspid regurgitation.  An exercise stress test noted normal myocardial perfusion with breast tissue attenuation artifact without ischemia or scar.  Overall a low risk study.  Clinically patient denies any chest pain or anginal equivalent.  She is overall euvolemic and not in congestive heart failure.  FUNCTIONAL STATUS: Prior to the pandemic she would regularly go to gym. No structured exercise program or daily routine.   ALLERGIES: Allergies  Allergen Reactions  . Lisinopril Other (See Comments)    Cough and throat clearing  . Metoprolol Succinate     Unknown reaction  . Penicillins Swelling    Has patient had a PCN reaction causing immediate rash, facial/tongue/throat swelling, SOB or lightheadedness  with hypotension: Yes Has patient had a PCN reaction causing severe rash involving mucus membranes or skin necrosis: No Has patient had a PCN reaction that required hospitalization: No Has patient had a PCN reaction occurring within the last 10 years: No If all of the above answers are "NO", then may proceed with Cephalosporin use.   . Sulfamethoxazole   . Sulfamethoxazole-Trimethoprim     Unknown reaction  . Trimethoprim   . Metformin Nausea Only    MEDICATION LIST PRIOR TO VISIT: Current Meds  Medication Sig  . ACCU-CHEK AVIVA PLUS test strip   . ALPRAZolam (XANAX) 0.5 MG tablet TAKE 1 TABLET BY MOUTH 3  TIMES DAILY AS NEEDED FOR  ANXIETY OR SLEEP  . aspirin EC 81 MG tablet Take 1 tablet (81 mg total) by mouth daily.  Marland Kitchen BIOTIN 5000 PO Take by mouth.  . cyanocobalamin (,VITAMIN B-12,) 1000 MCG/ML injection INJECT 1 ML INTRAMUSCULARLY  ONCE EVERY MONTH  . Digestive Enzymes (DIGESTIVE ENZYME PO) Take 1 capsule by mouth daily.  . Dulaglutide (TRULICITY) 1.5 GY/6.9SW SOPN Inject into the skin every 14 (fourteen) days.  . Ergocalciferol (VITAMIN D2 PO) Take by mouth.  . famotidine (PEPCID) 20 MG tablet Take 2 tablets (40 mg total) by mouth daily.  Marland Kitchen gabapentin (NEURONTIN) 100 MG capsule Take 1 capsule (100 mg total) by mouth 3 (three) times daily. (Patient taking differently: Take 100 mg by mouth 3 (three) times daily as needed.)  . indapamide (LOZOL) 1.25 MG tablet Take 1 tablet by mouth once daily  . Lancet Devices (SIMPLE DIAGNOSTICS LANCING DEV) MISC   . levothyroxine (SYNTHROID, LEVOTHROID) 88 MCG tablet  Take 88 mcg by mouth daily.  Marland Kitchen LINZESS 290 MCG CAPS capsule TAKE 1 CAPSULE BY MOUTH ONCE DAILY BEFORE BREAKFAST  . lisinopril (ZESTRIL) 10 MG tablet Take 1 tablet by mouth once daily  . Propylene Glycol 0.6 % SOLN Take 1 application by mouth daily as needed.  . rosuvastatin (CRESTOR) 10 MG tablet Take 10 mg by mouth daily.     PAST MEDICAL HISTORY: Past Medical History:   Diagnosis Date  . Acute venous embolism and thrombosis of unspecified deep vessels of lower extremity 2001   RLE DVT and PE, coumadin x 24mo, now ASSA  . Anxiety state, unspecified   . Chronic idiopathic constipation 11/02/2016  . Degeneration of cervical intervertebral disc   . Gallstones   . History of DVT (deep vein thrombosis)   . Iron deficiency anemia secondary to blood loss (chronic)   . Irritable bowel syndrome   . Lumbago   . Other B-complex deficiencies   . Personal history of colonic polyps    adenomatous  . Pure hypercholesterolemia   . Reflux esophagitis   . Tubular adenoma of colon 09/2002  . Type II or unspecified type diabetes mellitus without mention of complication, not stated as uncontrolled   . Unspecified essential hypertension   . Unspecified hypothyroidism   . Unspecified iridocyclitis   . Unspecified venous (peripheral) insufficiency   . Unspecified vitamin D deficiency     PAST SURGICAL HISTORY: Past Surgical History:  Procedure Laterality Date  . BIOPSY  04/24/2018   Procedure: BIOPSY;  Surgeon: Rush Landmark Telford Nab., MD;  Location: [facility];  Service: Gastroenterology;;  . CARPAL TUNNEL RELEASE    . CATARACT EXTRACTION Bilateral 08/29/2017,09/19/17  . ESOPHAGOGASTRODUODENOSCOPY (EGD) WITH PROPOFOL N/A 04/24/2018   Procedure: ESOPHAGOGASTRODUODENOSCOPY (EGD) WITH PROPOFOL;  Surgeon: Rush Landmark Telford Nab., MD;  Location: [facility];  Service: Gastroenterology;  Laterality: N/A;  with EMR  . FEMORAL HERNIA REPAIR    . OOPHORECTOMY    . POLYPECTOMY  04/24/2018   Procedure: POLYPECTOMY;  Surgeon: Mansouraty, Telford Nab., MD;  Location: [facility];  Service: Gastroenterology;;  . UPPER ESOPHAGEAL ENDOSCOPIC ULTRASOUND (EUS)  04/24/2018   Procedure: UPPER ESOPHAGEAL ENDOSCOPIC ULTRASOUND (EUS);  Surgeon: Rush Landmark Telford Nab., MD;  Location: [facility];  Service: Gastroenterology;;  . VESICOVAGINAL FISTULA CLOSURE W/ TAH      FAMILY  HISTORY: The patient family history includes Deep vein thrombosis in her mother; Diabetes in her mother; Healthy in her sister; Hypertension in her mother; Prostate cancer in her father.  SOCIAL HISTORY:  The patient  reports that she has never smoked. She has never used smokeless tobacco. She reports current alcohol use. She reports that she does not use drugs.  REVIEW OF SYSTEMS: Review of Systems  Constitutional: Negative for chills and fever.  HENT: Negative for hoarse voice and nosebleeds.   Eyes: Negative for discharge, double vision and pain.  Cardiovascular: Negative for chest pain, claudication, dyspnea on exertion, leg swelling, near-syncope, orthopnea, palpitations, paroxysmal nocturnal dyspnea and syncope.  Respiratory: Negative for hemoptysis and shortness of breath.   Musculoskeletal: Positive for back pain. Negative for muscle cramps and myalgias.  Gastrointestinal: Negative for abdominal pain, constipation, diarrhea, hematemesis, hematochezia, melena, nausea and vomiting.  Neurological: Negative for dizziness and light-headedness.    PHYSICAL EXAM: Vitals with BMI 07/02/2020 06/03/2020 05/24/2020  Height 5\' 4"  5\' 4"  5' 4.25"  Weight 165 lbs 169 lbs 170 lbs  BMI 28.31 16.10 96.04  Systolic 540 981 191  Diastolic 56 69 78  Pulse  84 99 -    CONSTITUTIONAL: Well-developed and well-nourished. No acute distress.  SKIN: Skin is warm and dry. No rash noted. No cyanosis. No pallor. No jaundice HEAD: Normocephalic and atraumatic.  EYES: No scleral icterus MOUTH/THROAT: Moist oral membranes.  NECK: No JVD present. No thyromegaly noted. No carotid bruits  LYMPHATIC: No visible cervical adenopathy.  CHEST Normal respiratory effort. No intercostal retractions  LUNGS: Clear to auscultation bilaterally. No stridor. No wheezes. No rales.  CARDIOVASCULAR: Regular rate and rhythm, positive S1-S2, no murmurs rubs or gallops appreciated. ABDOMINAL: No apparent ascites.  EXTREMITIES: No  peripheral edema  HEMATOLOGIC: No significant bruising NEUROLOGIC: Oriented to person, place, and time. Nonfocal. Normal muscle tone.  PSYCHIATRIC: Normal mood and affect. Normal behavior. Cooperative  CARDIAC DATABASE: EKG: 06/03/20: Normal sinus rhythm, 86 bpm, poor R wave progression, LVH per voltage criteria, ST-T changes in the lateral leads, without underlying injury pattern.  Echocardiogram: 06/09/2020: Left ventricle cavity is normal in size and wall thickness. Normal global wall motion. Normal LV systolic function with EF 59%. Doppler evidence of grade I (impaired) diastolic dysfunction, normal LAP. Moderate tricuspid regurgitation. No evidence of pulmonary hypertension.   Stress Testing: Exercise tetrofosmin stress test  06/23/2020: Normal ECG stress. The patient exercised for 3 minutes and 33 seconds of a Bruce protocol, achieving approximately 5.28 METs.   No chest pina, fatigue led to termination of stress. Normal BP response.   Myocardial perfusion reveals mild breast attenuation noted in the inferoseptal region. No ischemia or scar. Overall LV systolic function is abnormal without regional wall motion abnormalities. Stress LV EF: 42%.  Visually the LVEF appears normal. No previous exam available for comparison. Low risk.  Heart Catheterization: None  LABORATORY DATA: CBC Latest Ref Rng & Units 05/24/2020 05/01/2019 12/30/2018  WBC 4.0 - 10.5 K/uL 5.5 4.8 5.3  Hemoglobin 12.0 - 15.0 g/dL 12.7 12.6 12.8  Hematocrit 36.0 - 46.0 % 37.4 38.1 37.4  Platelets 150.0 - 400.0 K/uL 191.0 233.0 200.0    CMP Latest Ref Rng & Units 05/24/2020 10/30/2019 07/08/2019  Glucose 70 - 99 mg/dL 101(H) 100(H) 121(H)  BUN 6 - 23 mg/dL 23 17 21   Creatinine 0.40 - 1.20 mg/dL 1.65(H) 1.37(H) 1.61(H)  Sodium 135 - 145 mEq/L 137 138 138  Potassium 3.5 - 5.1 mEq/L 4.2 3.9 4.5  Chloride 96 - 112 mEq/L 100 101 103  CO2 19 - 32 mEq/L 32 30 30  Calcium 8.4 - 10.5 mg/dL 10.0 10.2 9.9  Total Protein  6.0 - 8.3 g/dL 7.6 - -  Total Bilirubin 0.2 - 1.2 mg/dL 1.0 - -  Alkaline Phos 39 - 117 U/L 44 - -  AST 0 - 37 U/L 25 - -  ALT 0 - 35 U/L 15 - -    Lipid Panel     Component Value Date/Time   CHOL 128 05/24/2020 1148   TRIG 100.0 05/24/2020 1148   HDL 65.00 05/24/2020 1148   CHOLHDL 2 05/24/2020 1148   VLDL 20.0 05/24/2020 1148   LDLCALC 43 05/24/2020 1148   LDLDIRECT 105.9 07/04/2007 1033    No components found for: NTPROBNP No results for input(s): PROBNP in the last 8760 hours. Recent Labs    10/30/19 1003 05/24/20 1148  TSH 1.48 1.22    BMP Recent Labs    07/08/19 1440 10/30/19 1003 05/24/20 1148  NA 138 138 137  K 4.5 3.9 4.2  CL 103 101 100  CO2 30 30 32  GLUCOSE 121* 100* 101*  BUN  21 17 23   CREATININE 1.61* 1.37* 1.65*  CALCIUM 9.9 10.2 10.0    HEMOGLOBIN A1C Lab Results  Component Value Date   HGBA1C 5.9 05/24/2020    IMPRESSION:    ICD-10-CM   1. Abnormal electrocardiogram (ECG) (EKG)  R94.31   2. Essential hypertension  I10   3. Prediabetes  R73.03   4. Hyperlipidemia  E78.5   5. Nonrheumatic tricuspid valve regurgitation  I36.1 PCV ECHOCARDIOGRAM COMPLETE     RECOMMENDATIONS: Katie [name] is a 75 y.o. female whose past medical history and cardiac risk factors include: History of DVT, GERD, hypertension chronic kidney disease stage IIIb, acquired hypothyroidism, hyperlipidemia, diabetes, advanced age, postmenopausal female.  Patient was originally referred to the office to be evaluated for an abnormal EKG given her multiple cardiovascular risk factors as outlined above.  Since last visit she has undergone an echocardiogram and stress testing.  The echocardiogram notes preserved LVEF with grade 1 diastolic impairment and moderate TR.  And exercise nuclear stress test overall a low risk study.  Clinically patient appears to be stable from a cardiovascular standpoint and recommend no additional testing.  Given her moderate tricuspid  regurgitation which is a new finding recommend 1 year follow-up echocardiogram to evaluate disease progression.  I will see her in follow-up after the echo was performed.  She is encouraged to follow-up with her PCP for the management of her other chronic comorbid conditions including diabetes, hypertension, and hyperlipidemia.  Total time spent: 20 minutes.  FINAL MEDICATION LIST END OF ENCOUNTER: No orders of the defined types were placed in this encounter.   There are no discontinued medications.   Current Outpatient Medications:  .  ACCU-CHEK AVIVA PLUS test strip, , Disp: , Rfl:  .  ALPRAZolam (XANAX) 0.5 MG tablet, TAKE 1 TABLET BY MOUTH 3  TIMES DAILY AS NEEDED FOR  ANXIETY OR SLEEP, Disp: 270 tablet, Rfl: 1 .  aspirin EC 81 MG tablet, Take 1 tablet (81 mg total) by mouth daily., Disp: 90 tablet, Rfl: 1 .  BIOTIN 5000 PO, Take by mouth., Disp: , Rfl:  .  cyanocobalamin (,VITAMIN B-12,) 1000 MCG/ML injection, INJECT 1 ML INTRAMUSCULARLY  ONCE EVERY MONTH, Disp: 3 mL, Rfl: 0 .  Digestive Enzymes (DIGESTIVE ENZYME PO), Take 1 capsule by mouth daily., Disp: , Rfl:  .  Dulaglutide (TRULICITY) 1.5 CB/6.3AG SOPN, Inject into the skin every 14 (fourteen) days., Disp: , Rfl:  .  Ergocalciferol (VITAMIN D2 PO), Take by mouth., Disp: , Rfl:  .  famotidine (PEPCID) 20 MG tablet, Take 2 tablets (40 mg total) by mouth daily., Disp: 180 tablet, Rfl: 1 .  gabapentin (NEURONTIN) 100 MG capsule, Take 1 capsule (100 mg total) by mouth 3 (three) times daily. (Patient taking differently: Take 100 mg by mouth 3 (three) times daily as needed.), Disp: 90 capsule, Rfl: 5 .  indapamide (LOZOL) 1.25 MG tablet, Take 1 tablet by mouth once daily, Disp: 90 tablet, Rfl: 1 .  Lancet Devices (SIMPLE DIAGNOSTICS LANCING DEV) MISC, , Disp: , Rfl:  .  levothyroxine (SYNTHROID, LEVOTHROID) 88 MCG tablet, Take 88 mcg by mouth daily., Disp: , Rfl:  .  LINZESS 290 MCG CAPS capsule, TAKE 1 CAPSULE BY MOUTH ONCE DAILY BEFORE  BREAKFAST, Disp: 30 capsule, Rfl: 5 .  lisinopril (ZESTRIL) 10 MG tablet, Take 1 tablet by mouth once daily, Disp: 90 tablet, Rfl: 0 .  Propylene Glycol 0.6 % SOLN, Take 1 application by mouth daily as needed., Disp: , Rfl:  .  rosuvastatin (CRESTOR) 10 MG tablet, Take 10 mg by mouth daily., Disp: , Rfl:   Orders Placed This Encounter  Procedures  . PCV ECHOCARDIOGRAM COMPLETE    There are no Patient Instructions on file for this visit.   --Continue cardiac medications as reconciled in final medication list. --Return in about 1 year (around 07/13/2021) for Follow up after echo to evaluate TR. Or sooner if needed. --Continue follow-up with your primary care physician regarding the management of your other chronic comorbid conditions.  Patient's questions and concerns were addressed to her satisfaction. She voices understanding of the instructions provided during this encounter.   This note was created using a voice recognition software as a result there may be grammatical errors inadvertently enclosed that do not reflect the nature of this encounter. Every attempt is made to correct such errors.  Rex Kras, [place], [facility]  Pager: [phone] Office: [phone]

## 2020-07-10 ENCOUNTER — Other Ambulatory Visit: Payer: Self-pay | Admitting: Internal Medicine

## 2020-07-29 ENCOUNTER — Other Ambulatory Visit: Payer: Self-pay | Admitting: Internal Medicine

## 2020-07-29 DIAGNOSIS — I10 Essential (primary) hypertension: Secondary | ICD-10-CM

## 2020-08-06 ENCOUNTER — Telehealth: Payer: Self-pay | Admitting: Hematology and Oncology

## 2020-08-06 NOTE — Telephone Encounter (Signed)
Received a new hem referral from Dr. Moshe Cipro from [facility] for [facility]. [name] has been cld and scheduled to see Dr. Chryl Heck on 3/30 at 1040am. Pt aware to arrive 20 minutes early.

## 2020-08-11 ENCOUNTER — Encounter: Payer: Self-pay | Admitting: Hematology and Oncology

## 2020-08-11 ENCOUNTER — Ambulatory Visit (HOSPITAL_COMMUNITY)
Admission: RE | Admit: 2020-08-11 | Discharge: 2020-08-11 | Disposition: A | Discharge status: Home / Self Care | Payer: Medicare Other | Source: Ambulatory Visit | Attending: Hematology and Oncology | Admitting: Hematology and Oncology

## 2020-08-11 ENCOUNTER — Inpatient Hospital Stay: Payer: Medicare Other

## 2020-08-11 ENCOUNTER — Telehealth: Payer: Self-pay | Admitting: Hematology and Oncology

## 2020-08-11 ENCOUNTER — Inpatient Hospital Stay: Payer: Medicare Other | Attending: Hematology and Oncology | Admitting: Hematology and Oncology

## 2020-08-11 ENCOUNTER — Other Ambulatory Visit: Payer: Self-pay

## 2020-08-11 VITALS — BP 108/61 | HR 85 | Temp 97.9°F | Resp 16 | Ht 64.0 in | Wt 165.6 lb

## 2020-08-11 DIAGNOSIS — N189 Chronic kidney disease, unspecified: Secondary | ICD-10-CM | POA: Diagnosis not present

## 2020-08-11 DIAGNOSIS — D472 Monoclonal gammopathy: Secondary | ICD-10-CM

## 2020-08-11 DIAGNOSIS — Z86718 Personal history of other venous thrombosis and embolism: Secondary | ICD-10-CM | POA: Insufficient documentation

## 2020-08-11 LAB — COMPREHENSIVE METABOLIC PANEL
ALT: 13 U/L (ref 0–44)
AST: 24 U/L (ref 15–41)
Albumin: 3.8 g/dL (ref 3.5–5.0)
Alkaline Phosphatase: 51 U/L (ref 38–126)
Anion gap: 11 (ref 5–15)
BUN: 29 mg/dL — ABNORMAL HIGH (ref 8–23)
CO2: 27 mmol/L (ref 22–32)
Calcium: 9.2 mg/dL (ref 8.9–10.3)
Chloride: 103 mmol/L (ref 98–111)
Creatinine, Ser: 2.09 mg/dL — ABNORMAL HIGH (ref 0.44–1.00)
GFR, Estimated: 24 mL/min — ABNORMAL LOW (ref >60)
Glucose, Bld: 122 mg/dL — ABNORMAL HIGH (ref 70–99)
Potassium: 3.5 mmol/L (ref 3.5–5.1)
Sodium: 141 mmol/L (ref 135–145)
Total Bilirubin: 1.1 mg/dL (ref 0.3–1.2)
Total Protein: 7.5 g/dL (ref 6.5–8.1)

## 2020-08-11 LAB — CBC WITH DIFFERENTIAL/PLATELET
Abs Immature Granulocytes: 0.01 10*3/uL (ref 0.00–0.07)
Basophils Absolute: 0 10*3/uL (ref 0.0–0.1)
Basophils Relative: 1 %
Eosinophils Absolute: 0.2 10*3/uL (ref 0.0–0.5)
Eosinophils Relative: 5 %
HCT: 33.1 % — ABNORMAL LOW (ref 36.0–46.0)
Hemoglobin: 11 g/dL — ABNORMAL LOW (ref 12.0–15.0)
Immature Granulocytes: 0 %
Lymphocytes Relative: 35 %
Lymphs Abs: 1.5 10*3/uL (ref 0.7–4.0)
MCH: 25.8 pg — ABNORMAL LOW (ref 26.0–34.0)
MCHC: 33.2 g/dL (ref 30.0–36.0)
MCV: 77.7 fL — ABNORMAL LOW (ref 80.0–100.0)
Monocytes Absolute: 0.4 10*3/uL (ref 0.1–1.0)
Monocytes Relative: 11 %
Neutro Abs: 2 10*3/uL (ref 1.7–7.7)
Neutrophils Relative %: 48 %
Platelets: 194 10*3/uL (ref 150–400)
RBC: 4.26 MIL/uL (ref 3.87–5.11)
RDW: 14.2 % (ref 11.5–15.5)
WBC: 4.1 10*3/uL (ref 4.0–10.5)
nRBC: 0 % (ref 0.0–0.2)

## 2020-08-11 NOTE — Telephone Encounter (Signed)
Scheduled per los. Gave avs and calendar  

## 2020-08-11 NOTE — Progress Notes (Signed)
[place] NOTE  Patient Care Team: Janith Lima, MD as PCP - General (Internal Medicine) Altheimer, Legrand Como, MD (Endocrinology) Ladene Artist, MD (Gastroenterology) Rosemary Holms, DPM (Podiatry) Rutherford Guys, MD (Ophthalmology) Carren Rang, Nadara Mustard, MD (Gynecology) Lavonna Monarch, MD as Consulting Physician (Dermatology)  CHIEF COMPLAINTS/PURPOSE OF CONSULTATION:  MGUS  ASSESSMENT & PLAN:  No problem-specific Assessment & Plan notes found for this encounter.  No orders of the defined types were placed in this encounter.  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  She has no concerning review of systems except for new onset left shoulder blade pain for the past 1 month.  She otherwise has been losing weight but this is intentional according to the patient. Physical examination unremarkable, no palpable lymphadenopathy or hepatosplenomegaly. I reviewed her labs.  She has mild anemia, CKD which has been slowly worsening, serum protein electrophoresis showed 0.8 g/dL of monoclonal protein, I did not see an immunofixation and kappa lambda ratio.  She does not have any overt iron deficiency on her labs. Discussed that monoclonal gammopathy is clinically asymptomatic premalignant plasma cell disorder which is defined by the presence of monoclonal protein, absence of endorgan damage and bone marrow with less than 10% monoclonal plasma cells.  This is typically detected as an incidental finding when patients undergo protein electrophoresis for white bradycardia of clinical symptoms and disorders.  We have discussed further evaluation with repeat SPEP, immunofixation, kappa lambda ratio, 24-hour urine  protein electrophoresis and skeletal survey. She will return to clinic with repeat labs and will discuss any additional recommendations.  If she has a low risk MGUS, then we will follow up with every 6 months to annually with labs and urine electrophoresis.  Thank you for consulting [name] in the care of this patient.  Please not hesitate to contact us with any additional questions or concerns  HISTORY OF PRESENTING ILLNESS:   Katie [name] 75 y.o. female is here because of MGUS  This is a very pleasant 75 year old female patient with past medical history significant for high blood pressure, history of DVT chronic kidney disease referred to hematology for evaluation of MGUS.  Patient arrived to the appointment today by herself.  She tells me that she feels quite well, has been trying to lose weight and she has been successful so far.  No fevers, drenching night sweats, appetite has been good.  She does complain of a new onset left shoulder blade pain for the past 1 month otherwise denies any new bone pains.  She has chronic constipation and takes mag citrate for constipation.  No change in urinary habits.  No change in breathing.  Rest of the pertinent 10 point ROS reviewed and negative.  REVIEW OF SYSTEMS:   Constitutional: Denies fevers, chills or abnormal night sweats Eyes: Denies blurriness of vision, double vision or watery eyes Ears, nose, mouth, throat, and face: Denies mucositis or sore throat Respiratory: Denies cough, dyspnea or wheezes Cardiovascular: Denies palpitation, chest discomfort or lower extremity swelling Gastrointestinal:  Denies nausea, heartburn or change in bowel habits Skin: Denies abnormal skin rashes Lymphatics: Denies new lymphadenopathy or easy bruising Neurological:Denies numbness, tingling or new weaknesses Behavioral/Psych: Mood is stable, no new changes  All other systems were reviewed with the  patient and are negative.  MEDICAL HISTORY:  Past Medical History:   Diagnosis Date  . Acute venous embolism and thrombosis of unspecified deep vessels of lower extremity 2001   RLE DVT and PE, coumadin x 28mo, now ASSA  . Anxiety state, unspecified   . Chronic idiopathic constipation 11/02/2016  . Degeneration of cervical intervertebral disc   . Gallstones   . History of DVT (deep vein thrombosis)   . Iron deficiency anemia secondary to blood loss (chronic)   . Irritable bowel syndrome   . Lumbago   . Other B-complex deficiencies   . Personal history of colonic polyps    adenomatous  . Pure hypercholesterolemia   . Reflux esophagitis   . Tubular adenoma of colon 09/2002  . Type II or unspecified type diabetes mellitus without mention of complication, not stated as uncontrolled   . Unspecified essential hypertension   . Unspecified hypothyroidism   . Unspecified iridocyclitis   . Unspecified venous (peripheral) insufficiency   . Unspecified vitamin D deficiency     SURGICAL HISTORY: Past Surgical History:  Procedure Laterality Date  . BIOPSY  04/24/2018   Procedure: BIOPSY;  Surgeon: Rush Landmark Telford Nab., MD;  Location: [facility];  Service: Gastroenterology;;  . CARPAL TUNNEL RELEASE    . CATARACT EXTRACTION Bilateral 08/29/2017,09/19/17  . ESOPHAGOGASTRODUODENOSCOPY (EGD) WITH PROPOFOL N/A 04/24/2018   Procedure: ESOPHAGOGASTRODUODENOSCOPY (EGD) WITH PROPOFOL;  Surgeon: Rush Landmark Telford Nab., MD;  Location: [facility];  Service: Gastroenterology;  Laterality: N/A;  with EMR  . FEMORAL HERNIA REPAIR    . OOPHORECTOMY    . POLYPECTOMY  04/24/2018   Procedure: POLYPECTOMY;  Surgeon: Mansouraty, Telford Nab., MD;  Location: [facility];  Service: Gastroenterology;;  . UPPER ESOPHAGEAL ENDOSCOPIC ULTRASOUND (EUS)  04/24/2018   Procedure: UPPER ESOPHAGEAL ENDOSCOPIC ULTRASOUND (EUS);  Surgeon: Rush Landmark Telford Nab., MD;  Location: [facility];  Service: Gastroenterology;;  . VESICOVAGINAL FISTULA CLOSURE W/ TAH      SOCIAL  HISTORY: Social History   Socioeconomic History  . Marital status: Married    Spouse name: Brynleigh Sequeira  . Number of children: 0  . Years of education: masters  . Highest education level: Not on file  Occupational History  . Occupation: record Firefighter: [employer]  Tobacco Use  . Smoking status: Never Smoker  . Smokeless tobacco: Never Used  Vaping Use  . Vaping Use: Never used  Substance and Sexual Activity  . Alcohol use: Yes    Alcohol/week: 0.0 standard drinks    Comment: occasional-wine   . Drug use: No  . Sexual activity: Not Currently  Other Topics Concern  . Not on file  Social History Narrative   Lives with husband in a 2 story home with a basement.  Has no children.     Retired from the police department.     Education: masters (guidance and counseling).    Social Determinants of Health   Financial Resource Strain: Not on file  Food Insecurity: Not on file  Transportation Needs: Not on file  Physical Activity: Not on file  Stress: Not on file  Social Connections: Not on file  Intimate Partner Violence: Not on file    FAMILY HISTORY: Family History  Problem Relation Age of Onset  . Prostate cancer Father   . Diabetes Mother        retinopathy/blind  . Deep vein thrombosis Mother   . Hypertension Mother   . Healthy Sister   . Colon cancer Neg Hx   .  Esophageal cancer Neg Hx   . Stomach cancer Neg Hx   . Rectal cancer Neg Hx     ALLERGIES:  is allergic to lisinopril, metoprolol succinate, penicillins, sulfamethoxazole, sulfamethoxazole-trimethoprim, trimethoprim, and metformin.  MEDICATIONS:  Current Outpatient Medications  Medication Sig Dispense Refill  . ACCU-CHEK AVIVA PLUS test strip     . ALPRAZolam (XANAX) 0.5 MG tablet TAKE 1 TABLET BY MOUTH 3  TIMES DAILY AS NEEDED FOR  ANXIETY OR SLEEP 270 tablet 1  . aspirin EC 81 MG tablet Take 1 tablet (81 mg total) by mouth daily. 90 tablet 1  . BIOTIN 5000 PO Take by mouth.    .  cyanocobalamin (,VITAMIN B-12,) 1000 MCG/ML injection INJECT 1 ML INTRAMUSCULARLY  ONCE EVERY MONTH 3 mL 0  . Digestive Enzymes (DIGESTIVE ENZYME PO) Take 1 capsule by mouth daily.    . Dulaglutide (TRULICITY) 1.5 WU/1.3KG SOPN Inject into the skin every 14 (fourteen) days.    . Ergocalciferol (VITAMIN D2 PO) Take by mouth.    . famotidine (PEPCID) 20 MG tablet Take 2 tablets (40 mg total) by mouth daily. 180 tablet 1  . gabapentin (NEURONTIN) 100 MG capsule Take 1 capsule (100 mg total) by mouth 3 (three) times daily. (Patient taking differently: Take 100 mg by mouth 3 (three) times daily as needed.) 90 capsule 5  . indapamide (LOZOL) 1.25 MG tablet Take 1 tablet by mouth once daily 90 tablet 1  . Lancet Devices (SIMPLE DIAGNOSTICS LANCING DEV) MISC     . levothyroxine (SYNTHROID, LEVOTHROID) 88 MCG tablet Take 88 mcg by mouth daily.    Marland Kitchen LINZESS 290 MCG CAPS capsule TAKE 1 CAPSULE BY MOUTH ONCE DAILY BEFORE BREAKFAST 30 capsule 5  . lisinopril (ZESTRIL) 10 MG tablet Take 1 tablet by mouth once daily 90 tablet 0  . Propylene Glycol 0.6 % SOLN Take 1 application by mouth daily as needed.    . rosuvastatin (CRESTOR) 10 MG tablet Take 10 mg by mouth daily.    . Vitamin D, Ergocalciferol, (DRISDOL) 1.25 MG (50000 UNIT) CAPS capsule Take 50,000 Units by mouth once a week.     No current facility-administered medications for this visit.   PHYSICAL EXAMINATION: ECOG PERFORMANCE STATUS: 0 - Asymptomatic  Vitals:   08/11/20 1045  BP: 108/61  Pulse: 85  Resp: 16  Temp: 97.9 F (36.6 C)  SpO2: 100%   Filed Weights   08/11/20 1045  Weight: 165 lb 9.6 oz (75.1 kg)    GENERAL:alert, no distress and comfortable SKIN: skin color, texture, turgor are normal, no rashes or significant lesions EYES: normal, conjunctiva are pink and non-injected, sclera clear OROPHARYNX:no exudate, no erythema and lips, buccal mucosa, and tongue normal  NECK: supple, thyroid normal size, non-tender, without  nodularity LYMPH:  no palpable lymphadenopathy in the cervical, axillary or inguinal LUNGS: clear to auscultation and percussion with normal breathing effort HEART: regular rate & rhythm and no murmurs and no lower extremity edema ABDOMEN:abdomen soft, non-tender and normal bowel sounds Musculoskeletal:no cyanosis of digits and no clubbing  PSYCH: alert & oriented x 3 with fluent speech NEURO: no focal motor/sensory deficits  LABORATORY DATA:  I have reviewed the data as listed Lab Results  Component Value Date   WBC 5.5 05/24/2020   HGB 12.7 05/24/2020   HCT 37.4 05/24/2020   MCV 75.2 (L) 05/24/2020   PLT 191.0 05/24/2020     Chemistry      Component Value Date/Time   NA 137 05/24/2020 1148  NA 141 01/15/2018 0000   K 4.2 05/24/2020 1148   CL 100 05/24/2020 1148   CO2 32 05/24/2020 1148   BUN 23 05/24/2020 1148   BUN 12 01/15/2018 0000   CREATININE 1.65 (H) 05/24/2020 1148   GLU 103 01/15/2018 0000      Component Value Date/Time   CALCIUM 10.0 05/24/2020 1148   ALKPHOS 44 05/24/2020 1148   AST 25 05/24/2020 1148   ALT 15 05/24/2020 1148   BILITOT 1.0 05/24/2020 1148       RADIOGRAPHIC STUDIES: I have personally reviewed the radiological images as listed and agreed with the findings in the report. No results found.  All questions were answered. The patient knows to call the clinic with any problems, questions or concerns. I spent 45 minutes in the care of this patient including H and P, review of records, counseling and coordination of care.     Benay Pike, MD 08/11/2020 11:00 AM

## 2020-08-12 LAB — KAPPA/LAMBDA LIGHT CHAINS
Kappa free light chain: 48 mg/L — ABNORMAL HIGH (ref 3.3–19.4)
Kappa, lambda light chain ratio: 0.45 (ref 0.26–1.65)
Lambda free light chains: 106.6 mg/L — ABNORMAL HIGH (ref 5.7–26.3)

## 2020-08-16 LAB — PROTEIN ELECTROPHORESIS, SERUM, WITH REFLEX
A/G Ratio: 1.1 (ref 0.7–1.7)
Albumin ELP: 3.9 g/dL (ref 2.9–4.4)
Alpha-1-Globulin: 0.2 g/dL (ref 0.0–0.4)
Alpha-2-Globulin: 0.7 g/dL (ref 0.4–1.0)
Beta Globulin: 1.1 g/dL (ref 0.7–1.3)
Gamma Globulin: 1.4 g/dL (ref 0.4–1.8)
Globulin, Total: 3.4 g/dL (ref 2.2–3.9)
M-Spike, %: 0.6 g/dL — ABNORMAL HIGH
SPEP Interpretation: 0
Total Protein ELP: 7.3 g/dL (ref 6.0–8.5)

## 2020-08-16 LAB — UPEP/TP, 24-HR URINE
Albumin, U: 24.2 %
Alpha 1, Urine: 10.3 %
Alpha 2, Urine: 10.4 %
Beta, Urine: 32.3 %
Gamma Globulin, Urine: 22.8 %
Total Protein, Urine-Ur/day: 55 mg/24 hr (ref 30–150)
Total Protein, Urine: 8.4 mg/dL
Total Volume: 650

## 2020-08-16 LAB — IMMUNOFIXATION REFLEX, SERUM
IgA: 265 mg/dL (ref 64–422)
IgG (Immunoglobin G), Serum: 1311 mg/dL (ref 586–1602)
IgM (Immunoglobulin M), Srm: 158 mg/dL (ref 26–217)

## 2020-09-08 ENCOUNTER — Encounter: Payer: Self-pay | Admitting: Hematology and Oncology

## 2020-09-08 ENCOUNTER — Other Ambulatory Visit: Payer: Self-pay

## 2020-09-08 ENCOUNTER — Telehealth: Payer: Self-pay | Admitting: Hematology and Oncology

## 2020-09-08 ENCOUNTER — Inpatient Hospital Stay: Payer: Medicare Other | Attending: Hematology and Oncology | Admitting: Hematology and Oncology

## 2020-09-08 DIAGNOSIS — D472 Monoclonal gammopathy: Secondary | ICD-10-CM | POA: Diagnosis not present

## 2020-09-08 DIAGNOSIS — N1832 Chronic kidney disease, stage 3b: Secondary | ICD-10-CM | POA: Diagnosis not present

## 2020-09-08 DIAGNOSIS — N183 Chronic kidney disease, stage 3 unspecified: Secondary | ICD-10-CM | POA: Diagnosis not present

## 2020-09-08 DIAGNOSIS — Z79899 Other long term (current) drug therapy: Secondary | ICD-10-CM | POA: Diagnosis not present

## 2020-09-08 DIAGNOSIS — F411 Generalized anxiety disorder: Secondary | ICD-10-CM | POA: Insufficient documentation

## 2020-09-08 NOTE — Assessment & Plan Note (Signed)
Again etiology of CKD could be multifactorial such as DM/HTN however given recent worsening, it is reasonable to proceed with BMB

## 2020-09-08 NOTE — Progress Notes (Signed)
[place] NOTE  Patient Care Team: Janith Lima, MD as PCP - General (Internal Medicine) Altheimer, Legrand Como, MD (Endocrinology) Ladene Artist, MD (Gastroenterology) Rosemary Holms, Media (Podiatry) Rutherford Guys, MD (Ophthalmology) Carren Rang, Nadara Mustard, MD (Gynecology) Lavonna Monarch, MD as Consulting Physician (Dermatology)  CHIEF COMPLAINTS/PURPOSE OF CONSULTATION:  MGUS  ASSESSMENT & PLAN:   MGUS (monoclonal gammopathy of unknown significance) This is a very pleasant 75 year old female patient with past medical history significant for CKD who has recently seen her nephrologist because of worsening chronic kidney disease had some lab evaluation and was found to have monoclonal gammopathy on her labs and hence referred to hematology for further recommendations.   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 Bone survey showed no osteolytic lesions. I explained about the procedure. She is willing to proceed. Again she is very worried about her worsening kidney disease and hoping to talk to her nephrologist soon. She will RTC after BMB results   GAD (generalized anxiety disorder) She is taking alprazolam as needed.  Chronic renal disease, stage 3, moderately decreased glomerular filtration rate (GFR) between 30-59 mL/min/1.73 square meter (HCC) Again etiology of CKD could be multifactorial such as DM/HTN however given recent worsening, it is reasonable to proceed with BMB  No orders of the defined types were placed in this encounter.  Thank you for consulting [name] in the care of this patient.  Please not hesitate to contact us with any additional questions or concerns  HISTORY OF PRESENTING ILLNESS:   Katie [name] 75 y.o. female is here because of MGUS  This is a very pleasant 75 year old female patient with past medical history significant for high blood  pressure, history of DVT chronic kidney disease referred to hematology for evaluation of MGUS.   Interval History  She is doing well. She is understandably worried about the results. She denies any complaints. She is anxious about her kidney numbers. ROS unremarkable except for joint pains and anxiety.  REVIEW OF SYSTEMS:   Constitutional: Denies fevers, chills or abnormal night sweats Eyes: Denies blurriness of vision, double vision or watery eyes Ears, nose, mouth, throat, and face: Denies mucositis or sore throat Respiratory: Denies cough, dyspnea or wheezes Cardiovascular: Denies palpitation, chest discomfort or lower extremity swelling Gastrointestinal:  Denies nausea, heartburn or change in bowel habits Skin: Denies abnormal skin rashes Lymphatics: Denies new lymphadenopathy or easy bruising Neurological:Denies numbness, tingling or new weaknesses Behavioral/Psych: Mood is stable, no new changes  All other systems were reviewed with the patient and are negative.  MEDICAL HISTORY:  Past Medical History:  Diagnosis Date  . Acute venous embolism and thrombosis of unspecified deep vessels of lower extremity 2001   RLE DVT and PE, coumadin x 62mo now ASSA  . Anxiety state, unspecified   . Chronic idiopathic constipation 11/02/2016  . Degeneration of cervical intervertebral disc   . Gallstones   . History of DVT (deep vein thrombosis)   . Iron deficiency anemia secondary to blood loss (chronic)   . Irritable bowel syndrome   . Lumbago   . Other B-complex deficiencies   . Personal history of colonic polyps    adenomatous  . Pure hypercholesterolemia   . Reflux esophagitis   . Tubular adenoma of colon 09/2002  . Type II or unspecified type diabetes mellitus without mention of complication, not stated as uncontrolled   . Unspecified essential hypertension   .  Unspecified hypothyroidism   . Unspecified iridocyclitis   . Unspecified venous (peripheral) insufficiency   .  Unspecified vitamin D deficiency     SURGICAL HISTORY: Past Surgical History:  Procedure Laterality Date  . BIOPSY  04/24/2018   Procedure: BIOPSY;  Surgeon: Rush Landmark Telford Nab., MD;  Location: [facility];  Service: Gastroenterology;;  . CARPAL TUNNEL RELEASE    . CATARACT EXTRACTION Bilateral 08/29/2017,09/19/17  . ESOPHAGOGASTRODUODENOSCOPY (EGD) WITH PROPOFOL N/A 04/24/2018   Procedure: ESOPHAGOGASTRODUODENOSCOPY (EGD) WITH PROPOFOL;  Surgeon: Rush Landmark Telford Nab., MD;  Location: [facility];  Service: Gastroenterology;  Laterality: N/A;  with EMR  . FEMORAL HERNIA REPAIR    . OOPHORECTOMY    . POLYPECTOMY  04/24/2018   Procedure: POLYPECTOMY;  Surgeon: Mansouraty, Telford Nab., MD;  Location: [facility];  Service: Gastroenterology;;  . UPPER ESOPHAGEAL ENDOSCOPIC ULTRASOUND (EUS)  04/24/2018   Procedure: UPPER ESOPHAGEAL ENDOSCOPIC ULTRASOUND (EUS);  Surgeon: Rush Landmark Telford Nab., MD;  Location: [facility];  Service: Gastroenterology;;  . VESICOVAGINAL FISTULA CLOSURE W/ TAH      SOCIAL HISTORY: Social History   Socioeconomic History  . Marital status: Married    Spouse name: Phung Kotas  . Number of children: 0  . Years of education: masters  . Highest education level: Not on file  Occupational History  . Occupation: record Firefighter: [employer]  Tobacco Use  . Smoking status: Never Smoker  . Smokeless tobacco: Never Used  Vaping Use  . Vaping Use: Never used  Substance and Sexual Activity  . Alcohol use: Yes    Alcohol/week: 0.0 standard drinks    Comment: occasional-wine   . Drug use: No  . Sexual activity: Not Currently  Other Topics Concern  . Not on file  Social History Narrative   Lives with husband in a 2 story home with a basement.  Has no children.     Retired from the police department.     Education: masters (guidance and counseling).    Social Determinants of Health   Financial Resource Strain: Not on file  Food  Insecurity: Not on file  Transportation Needs: Not on file  Physical Activity: Not on file  Stress: Not on file  Social Connections: Not on file  Intimate Partner Violence: Not on file    FAMILY HISTORY: Family History  Problem Relation Age of Onset  . Prostate cancer Father   . Diabetes Mother        retinopathy/blind  . Deep vein thrombosis Mother   . Hypertension Mother   . Healthy Sister   . Colon cancer Neg Hx   . Esophageal cancer Neg Hx   . Stomach cancer Neg Hx   . Rectal cancer Neg Hx     ALLERGIES:  is allergic to lisinopril, metoprolol succinate, penicillins, sulfamethoxazole, sulfamethoxazole-trimethoprim, trimethoprim, and metformin.  MEDICATIONS:  Current Outpatient Medications  Medication Sig Dispense Refill  . ACCU-CHEK AVIVA PLUS test strip     . ALPRAZolam (XANAX) 0.5 MG tablet TAKE 1 TABLET BY MOUTH 3  TIMES DAILY AS NEEDED FOR  ANXIETY OR SLEEP 270 tablet 1  . aspirin EC 81 MG tablet Take 1 tablet (81 mg total) by mouth daily. 90 tablet 1  . BIOTIN 5000 PO Take by mouth.    . cyanocobalamin (,VITAMIN B-12,) 1000 MCG/ML injection INJECT 1 ML INTRAMUSCULARLY  ONCE EVERY MONTH 3 mL 0  . Digestive Enzymes (DIGESTIVE ENZYME PO) Take 1 capsule by mouth daily.    . Dulaglutide (TRULICITY) 1.5  MG/0.5ML SOPN Inject into the skin every 14 (fourteen) days.    . Ergocalciferol (VITAMIN D2 PO) Take by mouth.    . famotidine (PEPCID) 20 MG tablet Take 2 tablets (40 mg total) by mouth daily. 180 tablet 1  . gabapentin (NEURONTIN) 100 MG capsule Take 1 capsule (100 mg total) by mouth 3 (three) times daily. (Patient taking differently: Take 100 mg by mouth 3 (three) times daily as needed.) 90 capsule 5  . indapamide (LOZOL) 1.25 MG tablet Take 1 tablet by mouth once daily 90 tablet 1  . Lancet Devices (SIMPLE DIAGNOSTICS LANCING DEV) MISC     . levothyroxine (SYNTHROID, LEVOTHROID) 88 MCG tablet Take 88 mcg by mouth daily.    Marland Kitchen LINZESS 290 MCG CAPS capsule TAKE 1 CAPSULE  BY MOUTH ONCE DAILY BEFORE BREAKFAST 30 capsule 5  . lisinopril (ZESTRIL) 10 MG tablet Take 1 tablet by mouth once daily 90 tablet 0  . Propylene Glycol 0.6 % SOLN Take 1 application by mouth daily as needed.    . rosuvastatin (CRESTOR) 10 MG tablet Take 10 mg by mouth daily.    . Vitamin D, Ergocalciferol, (DRISDOL) 1.25 MG (50000 UNIT) CAPS capsule Take 50,000 Units by mouth once a week.     No current facility-administered medications for this visit.   PHYSICAL EXAMINATION: ECOG PERFORMANCE STATUS: 0 - Asymptomatic  Vitals:   09/08/20 0938  BP: 129/68  Pulse: 98  Resp: 17  Temp: 97.8 F (36.6 C)  SpO2: 100%   Filed Weights   09/08/20 0938  Weight: 163 lb 3.2 oz (74 kg)    GENERAL:alert, no distress and comfortable SKIN: skin color, texture, turgor are normal, no rashes or significant lesions EYES: normal, conjunctiva are pink and non-injected, sclera clear OROPHARYNX:no exudate, no erythema and lips, buccal mucosa, and tongue normal  NECK: supple, thyroid normal size, non-tender, without nodularity LYMPH:  no palpable lymphadenopathy in the cervical, axillary or inguinal LUNGS: clear to auscultation and percussion with normal breathing effort HEART: regular rate & rhythm and no murmurs and no lower extremity edema ABDOMEN:abdomen soft, non-tender and normal bowel sounds Musculoskeletal:no cyanosis of digits and no clubbing  PSYCH: alert & oriented x 3 with fluent speech NEURO: no focal motor/sensory deficits  LABORATORY DATA:  I have reviewed the data as listed Lab Results  Component Value Date   WBC 4.1 08/11/2020   HGB 11.0 (L) 08/11/2020   HCT 33.1 (L) 08/11/2020   MCV 77.7 (L) 08/11/2020   PLT 194 08/11/2020     Chemistry      Component Value Date/Time   NA 141 08/11/2020 1131   NA 141 01/15/2018 0000   K 3.5 08/11/2020 1131   CL 103 08/11/2020 1131   CO2 27 08/11/2020 1131   BUN 29 (H) 08/11/2020 1131   BUN 12 01/15/2018 0000   CREATININE 2.09 (H)  08/11/2020 1131   GLU 103 01/15/2018 0000      Component Value Date/Time   CALCIUM 9.2 08/11/2020 1131   ALKPHOS 51 08/11/2020 1131   AST 24 08/11/2020 1131   ALT 13 08/11/2020 1131   BILITOT 1.1 08/11/2020 1131      RADIOGRAPHIC STUDIES: I have personally reviewed the radiological images as listed and agreed with the findings in the report. DG Bone Survey Met  Result Date: 08/11/2020 CLINICAL DATA:  Monoclonal gammopathy of uncertain significance, right scapular pain EXAM: METASTATIC BONE SURVEY COMPARISON:  05/24/2020 FINDINGS: Lateral skull: No acute or destructive bony lesions. Upper extremities: No  acute or destructive bony lesions. Bilateral chromic clavicular joint osteoarthritis, right greater than left. Cervical spine: Multilevel spondylosis from C4 through C7. Mild diffuse facet hypertrophy. No acute or destructive bony lesions. Thoracic spine: Diffuse thoracic spondylosis greatest in the midthoracic spine. Mild right convex curvature. No acute or destructive bony lesions. Paraspinal soft tissues are unremarkable. Lumbar spine: Mild grade 1 anterolisthesis of L4 on L5. Otherwise alignment is anatomic. Severe diffuse facet hypertrophy. Prominent spondylosis at L1-2. No acute or destructive bony lesions. Chest: No acute airspace disease, effusion, or pneumothorax. No acute or destructive bony lesions. Cardiac silhouette is unremarkable. Pelvis: Frontal view demonstrates no acute or destructive bony lesions. Moderate right hip osteoarthritis with axial joint space narrowing and marginal osteophyte formation. No acute fractures. Lower extremities. Prior healed proximal left fibular fracture. No acute bony abnormalities. No destructive bony lesions. IMPRESSION: 1. No acute or destructive bony lesions. 2. Extensive spondylosis throughout the cervical, thoracic, lumbar spine. 3. Right hip osteoarthritis. 4. Acromioclavicular joint osteoarthritis, right greater than left. Electronically Signed    By: Randa Ngo M.D.   On: 08/11/2020 23:37    All questions were answered. The patient knows to call the clinic with any problems, questions or concerns.   Benay Pike, MD 09/08/2020 1:30 PM

## 2020-09-08 NOTE — Assessment & Plan Note (Signed)
She is taking alprazolam as needed.

## 2020-09-08 NOTE — Telephone Encounter (Signed)
Scheduled follow-up appointment per 4/27 los. Patient is aware. 

## 2020-09-08 NOTE — Assessment & Plan Note (Signed)
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 Bone survey showed no osteolytic lesions. I explained about the procedure. She is willing to proceed. Again she is very worried about her worsening kidney disease and hoping to talk to her nephrologist soon. She will RTC after BMB results

## 2020-09-09 ENCOUNTER — Telehealth (HOSPITAL_COMMUNITY): Payer: Self-pay

## 2020-09-09 ENCOUNTER — Other Ambulatory Visit: Payer: Self-pay | Admitting: Hematology and Oncology

## 2020-09-09 NOTE — Progress Notes (Signed)
Apparently she forgot everything we talked yesterday and her mind went blank I explained that given her monoclonal protein and worsening creatinine, I discussed that it is important to proceed with BMB. She says she now understands the need. She is agreeable to proceed.  Arturo Sofranko

## 2020-09-10 ENCOUNTER — Telehealth: Payer: Self-pay

## 2020-09-10 NOTE — Telephone Encounter (Signed)
Called and spoke with patient regarding upcoming [facility] appointment. Verbalized understanding of date and time.

## 2020-09-17 ENCOUNTER — Other Ambulatory Visit: Payer: Self-pay

## 2020-09-17 ENCOUNTER — Inpatient Hospital Stay: Payer: Medicare Other | Attending: Hematology and Oncology | Admitting: Adult Health

## 2020-09-17 ENCOUNTER — Inpatient Hospital Stay: Payer: Medicare Other

## 2020-09-17 VITALS — BP 124/68 | HR 74 | Temp 98.7°F | Resp 17

## 2020-09-17 DIAGNOSIS — D631 Anemia in chronic kidney disease: Secondary | ICD-10-CM | POA: Insufficient documentation

## 2020-09-17 DIAGNOSIS — E119 Type 2 diabetes mellitus without complications: Secondary | ICD-10-CM | POA: Diagnosis not present

## 2020-09-17 DIAGNOSIS — Z86718 Personal history of other venous thrombosis and embolism: Secondary | ICD-10-CM | POA: Insufficient documentation

## 2020-09-17 DIAGNOSIS — D472 Monoclonal gammopathy: Secondary | ICD-10-CM | POA: Diagnosis not present

## 2020-09-17 DIAGNOSIS — N183 Chronic kidney disease, stage 3 unspecified: Secondary | ICD-10-CM | POA: Diagnosis not present

## 2020-09-17 DIAGNOSIS — Z86010 Personal history of colonic polyps: Secondary | ICD-10-CM | POA: Insufficient documentation

## 2020-09-17 LAB — COMPREHENSIVE METABOLIC PANEL
ALT: 10 U/L (ref 0–44)
AST: 21 U/L (ref 15–41)
Albumin: 3.5 g/dL (ref 3.5–5.0)
Alkaline Phosphatase: 41 U/L (ref 38–126)
Anion gap: 10 (ref 5–15)
BUN: 19 mg/dL (ref 8–23)
CO2: 29 mmol/L (ref 22–32)
Calcium: 9.4 mg/dL (ref 8.9–10.3)
Chloride: 104 mmol/L (ref 98–111)
Creatinine, Ser: 1.51 mg/dL — ABNORMAL HIGH (ref 0.44–1.00)
GFR, Estimated: 36 mL/min — ABNORMAL LOW (ref >60)
Glucose, Bld: 111 mg/dL — ABNORMAL HIGH (ref 70–99)
Potassium: 3.8 mmol/L (ref 3.5–5.1)
Sodium: 143 mmol/L (ref 135–145)
Total Bilirubin: 1 mg/dL (ref 0.3–1.2)
Total Protein: 6.8 g/dL (ref 6.5–8.1)

## 2020-09-17 LAB — CBC WITH DIFFERENTIAL/PLATELET
Abs Immature Granulocytes: 0.02 10*3/uL (ref 0.00–0.07)
Basophils Absolute: 0 10*3/uL (ref 0.0–0.1)
Basophils Relative: 1 %
Eosinophils Absolute: 0.3 10*3/uL (ref 0.0–0.5)
Eosinophils Relative: 6 %
HCT: 30.6 % — ABNORMAL LOW (ref 36.0–46.0)
Hemoglobin: 10.3 g/dL — ABNORMAL LOW (ref 12.0–15.0)
Immature Granulocytes: 0 %
Lymphocytes Relative: 35 %
Lymphs Abs: 1.6 10*3/uL (ref 0.7–4.0)
MCH: 25.6 pg — ABNORMAL LOW (ref 26.0–34.0)
MCHC: 33.7 g/dL (ref 30.0–36.0)
MCV: 76.1 fL — ABNORMAL LOW (ref 80.0–100.0)
Monocytes Absolute: 0.6 10*3/uL (ref 0.1–1.0)
Monocytes Relative: 12 %
Neutro Abs: 2.1 10*3/uL (ref 1.7–7.7)
Neutrophils Relative %: 46 %
Platelets: 177 10*3/uL (ref 150–400)
RBC: 4.02 MIL/uL (ref 3.87–5.11)
RDW: 13.8 % (ref 11.5–15.5)
WBC: 4.5 10*3/uL (ref 4.0–10.5)
nRBC: 0 % (ref 0.0–0.2)

## 2020-09-17 MED ORDER — LIDOCAINE HCL 2 % IJ SOLN
INTRAMUSCULAR | Status: AC
  Filled 2020-09-17: qty 20

## 2020-09-17 NOTE — Progress Notes (Signed)
Patient observed for 30 minutes post procedure. VSS and patient denied any pain at site. Bandage clean, dry, and intact with no observed drainage present. Patient given verbal and written instructions on site care and post procedure care. Patient verbalized understanding.

## 2020-09-17 NOTE — Progress Notes (Signed)
INDICATION: MGUS  Brief examination was performed. ENT: adequate airway clearance Heart: regular rate and rhythm.No Murmurs Lungs: clear to auscultation, no wheezes, normal respiratory effort  Bone Marrow Biopsy and Aspiration Procedure Note   Informed consent was obtained and potential risks including bleeding, infection and pain were reviewed with the patient.  The patient's name, date of birth, identification, consent and allergies were verified prior to the start of procedure and time out was performed.  The left posterior iliac crest was chosen as the site of biopsy.  The skin was prepped with ChloraPrep.   12 cc of 2% lidocaine was used to provide local anaesthesia.   10 cc of bone marrow aspirate was obtained followed by 1cm biopsy.  Pressure was applied to the biopsy site and bandage was placed over the biopsy site. Patient was made to lie on the back for 30 mins prior to discharge.  The procedure was tolerated well. COMPLICATIONS: None BLOOD LOSS: none The patient was discharged home in stable condition with a 10/04/2020 follow up to review results.  Patient was provided with post bone marrow biopsy instructions and instructed to call if there was any bleeding or worsening pain.  Specimens sent for flow cytometry, cytogenetics and additional studies.  Signed Scot Dock, NP

## 2020-09-17 NOTE — Patient Instructions (Signed)
Bone Marrow Aspiration and Bone Marrow Biopsy, Adult, Care After This sheet gives you information about how to care for yourself after your procedure. Your health care provider may also give you more specific instructions. If you have problems or questions, contact your health care provider. What can I expect after the procedure? After the procedure, it is common to have:  Mild pain and tenderness.  Swelling.  Bruising. Follow these instructions at home: Puncture site care  Follow instructions from your health care provider about how to take care of the puncture site. Make sure you: ? Wash your hands with soap and water before and after you change your bandage (dressing). If soap and water are not available, use hand sanitizer. ? Change your dressing as told by your health care provider.  Check your puncture site every day for signs of infection. Check for: ? More redness, swelling, or pain. ? Fluid or blood. ? Warmth. ? Pus or a bad smell.   Activity  Return to your normal activities as told by your health care provider. Ask your health care provider what activities are safe for you.  Do not lift anything that is heavier than 10 lb (4.5 kg), or the limit that you are told, until your health care provider says that it is safe.  Do not drive for 24 hours if you were given a sedative during your procedure. General instructions  Take over-the-counter and prescription medicines only as told by your health care provider.  Do not take baths, swim, or use a hot tub until your health care provider approves. Ask your health care provider if you may take showers. You may only be allowed to take sponge baths.  If directed, put ice on the affected area. To do this: ? Put ice in a plastic bag. ? Place a towel between your skin and the bag. ? Leave the ice on for 20 minutes, 2-3 times a day.  Keep all follow-up visits as told by your health care provider. This is important.   Contact a  health care provider if:  Your pain is not controlled with medicine.  You have a fever.  You have more redness, swelling, or pain around the puncture site.  You have fluid or blood coming from the puncture site.  Your puncture site feels warm to the touch.  You have pus or a bad smell coming from the puncture site. Summary  After the procedure, it is common to have mild pain, tenderness, swelling, and bruising.  Follow instructions from your health care provider about how to take care of the puncture site and what activities are safe for you.  Take over-the-counter and prescription medicines only as told by your health care provider.  Contact a health care provider if you have any signs of infection, such as fluid or blood coming from the puncture site. This information is not intended to replace advice given to you by your health care provider. Make sure you discuss any questions you have with your health care provider. Document Revised: 09/17/2018 Document Reviewed: 09/17/2018 Elsevier Patient Education  2021 Elsevier Inc.  

## 2020-09-18 ENCOUNTER — Other Ambulatory Visit: Payer: Self-pay | Admitting: Internal Medicine

## 2020-09-18 DIAGNOSIS — F411 Generalized anxiety disorder: Secondary | ICD-10-CM

## 2020-09-27 ENCOUNTER — Ambulatory Visit: Payer: Medicare Other | Admitting: Dermatology

## 2020-09-27 ENCOUNTER — Encounter (HOSPITAL_COMMUNITY): Payer: Self-pay | Admitting: Hematology and Oncology

## 2020-09-30 ENCOUNTER — Encounter (HOSPITAL_COMMUNITY): Payer: Self-pay | Admitting: Hematology and Oncology

## 2020-09-30 LAB — SURGICAL PATHOLOGY

## 2020-10-04 ENCOUNTER — Encounter: Payer: Self-pay | Admitting: Hematology and Oncology

## 2020-10-04 ENCOUNTER — Telehealth: Payer: Self-pay | Admitting: Hematology and Oncology

## 2020-10-04 ENCOUNTER — Inpatient Hospital Stay: Payer: Medicare Other | Admitting: Hematology and Oncology

## 2020-10-04 ENCOUNTER — Other Ambulatory Visit: Payer: Self-pay

## 2020-10-04 DIAGNOSIS — N1832 Chronic kidney disease, stage 3b: Secondary | ICD-10-CM

## 2020-10-04 DIAGNOSIS — D472 Monoclonal gammopathy: Secondary | ICD-10-CM

## 2020-10-04 NOTE — Progress Notes (Signed)
[place] NOTE  Patient Care Team: Janith Lima, MD as PCP - General (Internal Medicine) Altheimer, Legrand Como, MD (Endocrinology) Ladene Artist, MD (Gastroenterology) Rosemary Holms, [place] (Podiatry) Rutherford Guys, MD (Ophthalmology) Carren Rang, Nadara Mustard, MD (Gynecology) Lavonna Monarch, MD as Consulting Physician (Dermatology)  CHIEF COMPLAINTS/PURPOSE OF CONSULTATION:  MGUS  ASSESSMENT & PLAN:   MGUS (monoclonal gammopathy of unknown significance) This is a very pleasant 75 year old female patient with past medical history significant for CKD who has recently seen her nephrologist because of worsening chronic kidney disease had some lab evaluation and was found to have monoclonal gammopathy on her labs and hence referred to hematology for further recommendations.   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 Bone marrow biopsy demonstrated small clone of plasma cells measuring about 3 to 5%, normal FISH testing, normal cytogenetics. At this time since she is considered to have low risk IgG MGUS, we have recommended surveillance in 1 year. She was encouraged to contact us with any new questions or concerns. Labs ordered for next follow-up. Thank you for consulting [name] in the care of this patient.  Please do not hesitate to contact us with any additional questions or concerns.   Chronic renal disease, stage 3, moderately decreased glomerular filtration rate (GFR) between 30-59 mL/min/1.73 square meter (HCC) Likely related to DM,  She doesn't have any evidence of MM,  Orders Placed This Encounter  Procedures  . CBC with Differential/Platelet    Standing Status:   Standing    Number of Occurrences:   22    Standing Expiration Date:   10/04/2021  . CMP ([employer] only)    Standing Status:   Future    Standing Expiration Date:   10/04/2021  . SPEP with reflex to IFE    Standing  Status:   Future    Standing Expiration Date:   10/04/2021  . Kappa/lambda light chains    Standing Status:   Future    Standing Expiration Date:   10/04/2021  . UPEP/TP, 24-Hr Urine    Standing Status:   Future    Standing Expiration Date:   10/04/2021  . IgG, IgA, IgM    Standing Status:   Future    Standing Expiration Date:   10/04/2021   Thank you for consulting [name] in the care of this patient.  Please not hesitate to contact us with any additional questions or concerns.  HISTORY OF PRESENTING ILLNESS:   Katie [name] 75 y.o. female is here because of MGUS  Interval History  Katie [name] is here for follow-up today.  She has been doing well since her last visit.  She was really worried about her kidney numbers last time.  She denies any B symptoms, new bone pains, unusual change in her bowel habits.  She has seen her kidney doctors since her last visit here and he discontinued her lisinopril and vitamin D, her kidney numbers apparently were noted to be slightly better.  She denies any other interim infections or hospitalizations.  Rest of the pertinent 10 point ROS reviewed and negative.  MEDICAL HISTORY:  Past Medical History:  Diagnosis Date  . Acute venous embolism and thrombosis of unspecified deep vessels of lower extremity 2001   RLE DVT and PE, coumadin x 75mo now ASSA  . Anxiety state, unspecified   . Chronic idiopathic constipation 11/02/2016  . Degeneration of cervical intervertebral disc   .  Gallstones   . History of DVT (deep vein thrombosis)   . Iron deficiency anemia secondary to blood loss (chronic)   . Irritable bowel syndrome   . Lumbago   . Other B-complex deficiencies   . Personal history of colonic polyps    adenomatous  . Pure hypercholesterolemia   . Reflux esophagitis   . Tubular adenoma of colon 09/2002  . Type II or unspecified type diabetes mellitus without mention of complication, not stated as uncontrolled   . Unspecified essential hypertension   .  Unspecified hypothyroidism   . Unspecified iridocyclitis   . Unspecified venous (peripheral) insufficiency   . Unspecified vitamin D deficiency     SURGICAL HISTORY: Past Surgical History:  Procedure Laterality Date  . BIOPSY  04/24/2018   Procedure: BIOPSY;  Surgeon: Rush Landmark Telford Nab., MD;  Location: [facility];  Service: Gastroenterology;;  . CARPAL TUNNEL RELEASE    . CATARACT EXTRACTION Bilateral 08/29/2017,09/19/17  . ESOPHAGOGASTRODUODENOSCOPY (EGD) WITH PROPOFOL N/A 04/24/2018   Procedure: ESOPHAGOGASTRODUODENOSCOPY (EGD) WITH PROPOFOL;  Surgeon: Rush Landmark Telford Nab., MD;  Location: [facility];  Service: Gastroenterology;  Laterality: N/A;  with EMR  . FEMORAL HERNIA REPAIR    . OOPHORECTOMY    . POLYPECTOMY  04/24/2018   Procedure: POLYPECTOMY;  Surgeon: Mansouraty, Telford Nab., MD;  Location: [facility];  Service: Gastroenterology;;  . UPPER ESOPHAGEAL ENDOSCOPIC ULTRASOUND (EUS)  04/24/2018   Procedure: UPPER ESOPHAGEAL ENDOSCOPIC ULTRASOUND (EUS);  Surgeon: Rush Landmark Telford Nab., MD;  Location: [facility];  Service: Gastroenterology;;  . VESICOVAGINAL FISTULA CLOSURE W/ TAH      SOCIAL HISTORY: Social History   Socioeconomic History  . Marital status: Married    Spouse name: Karmela Bram  . Number of children: 0  . Years of education: masters  . Highest education level: Not on file  Occupational History  . Occupation: record Firefighter: [employer]  Tobacco Use  . Smoking status: Never Smoker  . Smokeless tobacco: Never Used  Vaping Use  . Vaping Use: Never used  Substance and Sexual Activity  . Alcohol use: Yes    Alcohol/week: 0.0 standard drinks    Comment: occasional-wine   . Drug use: No  . Sexual activity: Not Currently  Other Topics Concern  . Not on file  Social History Narrative   Lives with husband in a 2 story home with a basement.  Has no children.     Retired from the police department.     Education: masters  (guidance and counseling).    Social Determinants of Health   Financial Resource Strain: Not on file  Food Insecurity: Not on file  Transportation Needs: Not on file  Physical Activity: Not on file  Stress: Not on file  Social Connections: Not on file  Intimate Partner Violence: Not on file    FAMILY HISTORY: Family History  Problem Relation Age of Onset  . Prostate cancer Father   . Diabetes Mother        retinopathy/blind  . Deep vein thrombosis Mother   . Hypertension Mother   . Healthy Sister   . Colon cancer Neg Hx   . Esophageal cancer Neg Hx   . Stomach cancer Neg Hx   . Rectal cancer Neg Hx     ALLERGIES:  is allergic to lisinopril, metoprolol succinate, penicillins, sulfamethoxazole, sulfamethoxazole-trimethoprim, trimethoprim, and metformin.  MEDICATIONS:  Current Outpatient Medications  Medication Sig Dispense Refill  . ACCU-CHEK AVIVA PLUS test strip     .  ALPRAZolam (XANAX) 0.5 MG tablet TAKE 1 TABLET BY MOUTH 3  TIMES DAILY AS NEEDED FOR  ANXIETY OR SLEEP. 270 tablet 1  . aspirin EC 81 MG tablet Take 1 tablet (81 mg total) by mouth daily. 90 tablet 1  . BIOTIN 5000 PO Take by mouth.    . cyanocobalamin (,VITAMIN B-12,) 1000 MCG/ML injection INJECT 1 ML INTRAMUSCULARLY  ONCE EVERY MONTH 3 mL 0  . Digestive Enzymes (DIGESTIVE ENZYME PO) Take 1 capsule by mouth daily.    . Dulaglutide (TRULICITY) 1.5 RA/0.7MA SOPN Inject into the skin every 14 (fourteen) days.    . Ergocalciferol (VITAMIN D2 PO) Take by mouth.    . famotidine (PEPCID) 20 MG tablet Take 2 tablets (40 mg total) by mouth daily. 180 tablet 1  . gabapentin (NEURONTIN) 100 MG capsule Take 1 capsule (100 mg total) by mouth 3 (three) times daily. (Patient taking differently: Take 100 mg by mouth 3 (three) times daily as needed.) 90 capsule 5  . indapamide (LOZOL) 1.25 MG tablet Take 1 tablet by mouth once daily 90 tablet 1  . Lanc[employer] (SIMPLE DIAGNOSTICS LANCING DEV) MISC     . levothyroxine  (SYNTHROID, LEVOTHROID) 88 MCG tablet Take 88 mcg by mouth daily.    Marland Kitchen LINZESS 290 MCG CAPS capsule TAKE 1 CAPSULE BY MOUTH ONCE DAILY BEFORE BREAKFAST 30 capsule 5  . Propylene Glycol 0.6 % SOLN Take 1 application by mouth daily as needed.    . rosuvastatin (CRESTOR) 10 MG tablet Take 10 mg by mouth daily.     No current facility-administered medications for this visit.   PHYSICAL EXAMINATION: ECOG PERFORMANCE STATUS: 0 - Asymptomatic  Vitals:   10/04/20 0941  BP: 123/68  Pulse: 88  Resp: 17  Temp: 97.9 F (36.6 C)  SpO2: 100%   Filed Weights   10/04/20 0941  Weight: 161 lb 11.2 oz (73.3 kg)   PE deferred in lieu of counseling.  LABORATORY DATA:  I have reviewed the data as listed Lab Results  Component Value Date   WBC 4.5 09/17/2020   HGB 10.3 (L) 09/17/2020   HCT 30.6 (L) 09/17/2020   MCV 76.1 (L) 09/17/2020   PLT 177 09/17/2020     Chemistry      Component Value Date/Time   NA 143 09/17/2020 0842   NA 141 01/15/2018 0000   K 3.8 09/17/2020 0842   CL 104 09/17/2020 0842   CO2 29 09/17/2020 0842   BUN 19 09/17/2020 0842   BUN 12 01/15/2018 0000   CREATININE 1.51 (H) 09/17/2020 0842   GLU 103 01/15/2018 0000      Component Value Date/Time   CALCIUM 9.4 09/17/2020 0842   ALKPHOS 41 09/17/2020 0842   AST 21 09/17/2020 0842   ALT 10 09/17/2020 0842   BILITOT 1.0 09/17/2020 0842      SURGICAL PATHOLOGY   CASE: [id number]  PATIENT: Katie [name]  Bone Marrow Report   Reason for Addendum #1: Molecular Genetic Test Results, [place]  Reason for Addendum #2: Cytogenetics results   Clinical History: MGUS, left posterior iliac, (ADC)      DIAGNOSIS:   BONE MARROW, ASPIRATE, CLOT, CORE:  - Normocellular bone marrow with trilineage hematopoiesis and small  lambda-restricted plasma cell population  - See comment   PERIPHERAL BLOOD:  - Normocytic anemia  - See CBC data    COMMENT:   CD138 immunohistochemistry on the clot and core biopsy  highlights a mild  increase in plasma cells (3-5%).  Kappa/lambda light chain in situ  hybridization highlights a patchy expansion of lambda-restricted plasma  cells with aberrant CD56 expression by immunohistochemistry in a  background of polytypic plasma cells. Correlation with clinical  findings, radiographic studies, other laboratory data, and  cytogenetics/FISH results is recommended  FISH results normal, no evidence of abnormalities  Cytogenetics normal.  RADIOGRAPHIC STUDIES: I have personally reviewed the radiological images as listed and agreed with the findings in the report. No results found.  We have reviewed bone marrow biopsy results, cytogenetics and FISH testing results today. All questions were answered. The patient knows to call the clinic with any problems, questions or concerns.   Benay Pike, MD 10/04/2020 10:14 AM

## 2020-10-04 NOTE — Assessment & Plan Note (Addendum)
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 Bone marrow biopsy demonstrated small clone of plasma cells measuring about 3 to 5%, normal FISH testing, normal cytogenetics. At this time since she is considered to have low risk IgG MGUS, we have recommended surveillance in 1 year. She was encouraged to contact us with any new questions or concerns. Labs ordered for next follow-up. Thank you for consulting [name] in the care of this patient.  Please do not hesitate to contact us with any additional questions or concerns.

## 2020-10-04 NOTE — Telephone Encounter (Signed)
Scheduled follow-up appointments per 5/23 los. Patient is aware. 

## 2020-10-04 NOTE — Assessment & Plan Note (Signed)
Likely related to DM,  She doesn't have any evidence of MM,

## 2020-10-13 ENCOUNTER — Other Ambulatory Visit: Payer: Self-pay | Admitting: Internal Medicine

## 2020-10-13 DIAGNOSIS — E538 Deficiency of other specified B group vitamins: Secondary | ICD-10-CM

## 2020-10-24 ENCOUNTER — Other Ambulatory Visit: Payer: Self-pay | Admitting: Internal Medicine

## 2020-11-10 ENCOUNTER — Other Ambulatory Visit: Payer: Self-pay

## 2020-11-10 ENCOUNTER — Encounter (HOSPITAL_COMMUNITY): Payer: Self-pay

## 2020-11-10 ENCOUNTER — Emergency Department (HOSPITAL_COMMUNITY)
Admission: EM | Admit: 2020-11-10 | Discharge: 2020-11-10 | Disposition: A | Discharge status: Home / Self Care | Payer: Medicare Other | Attending: Emergency Medicine | Admitting: Emergency Medicine

## 2020-11-10 DIAGNOSIS — Z7982 Long term (current) use of aspirin: Secondary | ICD-10-CM | POA: Diagnosis not present

## 2020-11-10 DIAGNOSIS — R2242 Localized swelling, mass and lump, left lower limb: Secondary | ICD-10-CM | POA: Insufficient documentation

## 2020-11-10 DIAGNOSIS — I129 Hypertensive chronic kidney disease with stage 1 through stage 4 chronic kidney disease, or unspecified chronic kidney disease: Secondary | ICD-10-CM | POA: Insufficient documentation

## 2020-11-10 DIAGNOSIS — E039 Hypothyroidism, unspecified: Secondary | ICD-10-CM | POA: Diagnosis not present

## 2020-11-10 DIAGNOSIS — Z79899 Other long term (current) drug therapy: Secondary | ICD-10-CM | POA: Insufficient documentation

## 2020-11-10 DIAGNOSIS — M79672 Pain in left foot: Secondary | ICD-10-CM | POA: Diagnosis not present

## 2020-11-10 DIAGNOSIS — N183 Chronic kidney disease, stage 3 unspecified: Secondary | ICD-10-CM | POA: Insufficient documentation

## 2020-11-10 DIAGNOSIS — D631 Anemia in chronic kidney disease: Secondary | ICD-10-CM | POA: Diagnosis not present

## 2020-11-10 DIAGNOSIS — E1122 Type 2 diabetes mellitus with diabetic chronic kidney disease: Secondary | ICD-10-CM | POA: Insufficient documentation

## 2020-11-10 DIAGNOSIS — M25475 Effusion, left foot: Secondary | ICD-10-CM

## 2020-11-10 MED ORDER — PREDNISONE 10 MG PO TABS: 40.0000 mg | ORAL_TABLET | Freq: Every day | ORAL | 0 refills | Status: AC

## 2020-11-10 NOTE — ED Provider Notes (Signed)
[facility] DEPARTMENT Provider Note   CSN: [id number] Arrival date & time: 11/10/20  1041     History Chief Complaint  Patient presents with   Foot Pain    Katie [name] is a 75 y.o. female with a history of anemia, IBS, hypercholesterolemia, T2DM, and hypertension who presents to the emergency department with complaints of left foot pain/swelling x 2 weeks.  Patient having pain/swelling to the base of the left great toe, worse when walking around, improved some with postop shoe she is wearing and voltaren gel. Saw her podiatrist and states there was no intervention.  Denies injury or change in activity, plans to follow up next week.  Denies numbness, tingling, weakness, or open wounds.  HPI     Past Medical History:  Diagnosis Date   Acute venous embolism and thrombosis of unspecified deep vessels of lower extremity 2001   RLE DVT and PE, coumadin x 46mo, now ASSA   Anxiety state, unspecified    Chronic idiopathic constipation 11/02/2016   Degeneration of cervical intervertebral disc    Gallstones    History of DVT (deep vein thrombosis)    Iron deficiency anemia secondary to blood loss (chronic)    Irritable bowel syndrome    Lumbago    Other B-complex deficiencies    Personal history of colonic polyps    adenomatous   Pure hypercholesterolemia    Reflux esophagitis    Tubular adenoma of colon 09/2002   Type II or unspecified type diabetes mellitus without mention of complication, not stated as uncontrolled    Unspecified essential hypertension    Unspecified hypothyroidism    Unspecified iridocyclitis    Unspecified venous (peripheral) insufficiency    Unspecified vitamin D deficiency     Patient Active Problem List   Diagnosis Date Noted   MGUS (monoclonal gammopathy of unknown significance) 09/08/2020   Visit for screening mammogram 05/26/2020   Encounter for general adult medical examination with abnormal findings 05/24/2020   Chronic  laryngitis 05/24/2020   Right-sided chest wall pain 05/24/2020   Abnormal electrocardiogram (ECG) (EKG) 05/24/2020   Intestinal metaplasia of gastric mucosa 09/16/2019   Localized morphea 08/05/2019   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   PAD (peripheral artery disease) ([place]) 12/30/2018   Calculus of gallbladder with chronic cholecystitis without obstruction 02/06/2018   Chronic renal disease, stage 3, moderately decreased glomerular filtration rate (GFR) between 30-59 mL/min/1.73 square meter ([place]) 01/24/2018   Primary osteoarthritis of right hip 07/31/2017   Obesity, Class I, BMI 30.0-34.9 (see actual BMI) 09/20/2016   Spinal stenosis, lumbar region with neurogenic claudication 06/28/2016   Prediabetes 11/27/2012   Vitamin D deficiency 03/05/2008   Hyperlipidemia with target LDL less than 100 11/16/2007   Hypothyroidism 04/03/2007   B12 deficiency 04/03/2007   GAD (generalized anxiety disorder) 04/03/2007   PSEUDOTUMOR CEREBRI 04/03/2007   IRITIS 04/03/2007   Essential hypertension 04/03/2007   History of DVT of lower extremity 04/03/2007   Venous (peripheral) insufficiency 04/03/2007   REFLUX ESOPHAGITIS 04/03/2007   Constipation 04/03/2007   DEGENERATIVE [place] DISEASE, CERVICAL SPINE 04/03/2007    Past Surgical History:  Procedure Laterality Date   BIOPSY  04/24/2018   Procedure: BIOPSY;  Surgeon: Irving Copas., MD;  Location: [facility];  Service: Gastroenterology;;   Wilmon Pali RELEASE     CATARACT EXTRACTION Bilateral 08/29/2017,09/19/17   ESOPHAGOGASTRODUODENOSCOPY (EGD) WITH PROPOFOL N/A 04/24/2018   Procedure: ESOPHAGOGASTRODUODENOSCOPY (EGD) WITH PROPOFOL;  Surgeon: Irving Copas., MD;  Location: [facility];  Service: Gastroenterology;  Laterality: N/A;  with EMR   FEMORAL HERNIA REPAIR     OOPHORECTOMY     POLYPECTOMY  04/24/2018   Procedure: POLYPECTOMY;  Surgeon: Mansouraty, Telford Nab., MD;  Location: [facility];   Service: Gastroenterology;;   UPPER ESOPHAGEAL ENDOSCOPIC ULTRASOUND (EUS)  04/24/2018   Procedure: UPPER ESOPHAGEAL ENDOSCOPIC ULTRASOUND (EUS);  Surgeon: Rush Landmark Telford Nab., MD;  Location: [facility];  Service: Gastroenterology;;   VESICOVAGINAL FISTULA CLOSURE W/ TAH       OB History   No obstetric history on file.     Family History  Problem Relation Age of Onset   Prostate cancer Father    Diabetes Mother        retinopathy/blind   Deep vein thrombosis Mother    Hypertension Mother    Healthy Sister    Colon cancer Neg Hx    Esophageal cancer Neg Hx    Stomach cancer Neg Hx    Rectal cancer Neg Hx     Social History   Tobacco Use   Smoking status: Never   Smokeless tobacco: Never  Vaping Use   Vaping Use: Never used  Substance Use Topics   Alcohol use: Yes    Alcohol/week: 0.0 standard drinks    Comment: occasional-wine    Drug use: No    Home Medications Prior to Admission medications   Medication Sig Start Date End Date Taking? Authorizing Provider  ACCU-CHEK AVIVA PLUS test strip  07/14/13   [provider]  ALPRAZolam Duanne Moron) 0.5 MG tablet TAKE 1 TABLET BY MOUTH 3  TIMES DAILY AS NEEDED FOR  ANXIETY OR SLEEP. 09/20/20   Janith Lima, MD  aspirin EC 81 MG tablet Take 1 tablet (81 mg total) by mouth daily. 12/30/18   Janith Lima, MD  BIOTIN 5000 PO Take by mouth.    [provider]  cyanocobalamin (,VITAMIN B-12,) 1000 MCG/ML injection INJECT 1 ML  ONCE EVERY MONTH 10/13/20   Janith Lima, MD  Digestive Enzymes (DIGESTIVE ENZYME PO) Take 1 capsule by mouth daily.    [provider]  Dulaglutide (TRULICITY) 1.5 HK/7.4QV SOPN Inject into the skin every 14 (fourteen) days.    [provider]  Ergocalciferol (VITAMIN D2 PO) Take by mouth.    [provider]  famotidine (PEPCID) 20 MG tablet Take 2 tablets (40 mg total) by mouth daily. 05/24/20   Janith Lima, MD  gabapentin (NEURONTIN) 100 MG capsule Take 1  capsule (100 mg total) by mouth 3 (three) times daily. Patient taking differently: Take 100 mg by mouth 3 (three) times daily as needed. 05/01/19   Janith Lima, MD  indapamide (LOZOL) 1.25 MG tablet Take 1 tablet by mouth once daily 07/29/20   Janith Lima, MD  Lancet Devices (SIMPLE DIAGNOSTICS LANCING DEV) [place]  01/13/15   [provider]  levothyroxine (SYNTHROID, LEVOTHROID) 88 MCG tablet Take 88 mcg by mouth daily.    [provider]  LINZESS 290 MCG CAPS capsule TAKE 1 CAPSULE BY MOUTH ONCE DAILY BEFORE BREAKFAST 01/07/20   Ladene Artist, MD  Propylene Glycol 0.6 % SOLN Take 1 application by mouth daily as needed.    [provider]  rosuvastatin (CRESTOR) 10 MG tablet Take 10 mg by mouth daily. 03/17/18   [provider]    Allergies    Lisinopril, Metoprolol succinate, Penicillins, Sulfamethoxazole, Sulfamethoxazole-trimethoprim, Trimethoprim, and Metformin  Review of Systems   Review of Systems  Constitutional:  Negative for chills and fever.  Respiratory:  Negative for shortness of breath.   Cardiovascular:  Negative for chest pain.  Musculoskeletal:  Positive for arthralgias and joint swelling.  Skin:  Negative for wound.  Neurological:  Negative for weakness and numbness.  All other systems reviewed and are negative.  Physical Exam Updated Vital Signs BP 131/67 (BP Location: Right Arm)   Pulse 89   Temp 97.9 F (36.6 C) (Oral)   Resp 16   SpO2 100%   Physical Exam Vitals and nursing note reviewed.  Constitutional:      General: She is not in acute distress.    Appearance: She is not ill-appearing or toxic-appearing.  HENT:     Head: Normocephalic and atraumatic.  Cardiovascular:     Pulses:          Dorsalis pedis pulses are 2+ on the right side and 2+ on the left side.       Posterior tibial pulses are 2+ on the right side and 2+ on the left side.  Pulmonary:     Effort: Pulmonary effort is normal.  Musculoskeletal:      Comments: Lower extremities: Patient has swelling to the left first MTP joint. Very faint dorsal erythema to MTP- not circumferential or streaking.  No warmth. No fluctuance/induration/streaking erythema. She is able to move her digits and her ankle without difficulty.  She is tender over the left first MTP joint.  Otherwise nontender.  Skin:    General: Skin is warm and dry.     Capillary Refill: Capillary refill takes less than 2 seconds.  Neurological:     Mental Status: She is alert.     Comments: Alert. Clear speech. Sensation grossly intact to bilateral lower extremities. 5/5 strength with plantar/dorsiflexion bilaterally.  Psychiatric:        Mood and Affect: Mood normal.        Behavior: Behavior normal.    ED Results / Procedures / Treatments   Labs (all labs ordered are listed, but only abnormal results are displayed) Labs Reviewed - No data to display  EKG None  Radiology No results found.  Procedures Procedures   Medications Ordered in ED Medications - No data to display  ED Course  I have reviewed the triage vital signs and the nursing notes.  Pertinent labs & imaging results that were available during my care of the patient were reviewed by me and considered in my medical decision making (see chart for details).    MDM Rules/Calculators/A&P                          Patient presents to the ED with complaints of left foot pain/swelling x 2 weeks.  Nontoxic, vitals without significant abnormality.  Additional history obtained:  Additional history obtained from chart review & nursing note review.   Last A1c 5.9.  Patient without history of trauma, able to move the digit without difficulty, I have a low suspicion for fracture or dislocation. Very faint erythema to dorsal aspect of the MTP- noncircumferential, however no significant  warmth to the joint, no fluctuance or induration, seems less likely to be a cellulitis, abscess, or septic joint.  Possibly  gout given location, discussed with attending who has evaluated the patient- recommends 40 mg of prednisone for 5 days with close blood sugar monitoring at home and close follow up which I am in agreement with.   I discussed  treatment plan, need for  follow-up, and return precautions with the patient. Provided opportunity for questions, patient confirmed understanding and is in agreement with plan.   Findings and plan of care discussed with supervising physician Dr. Ronnald Nian who has evaluated patient as shared visit & is in agreement.    Portions of this note were generated with [employer]. Dictation errors may occur despite best attempts at proofreading.  Final Clinical Impression(s) / ED Diagnoses Final diagnoses:  Swelling of first metatarsophalangeal (MTP) joint of left foot    Rx / DC Orders ED Discharge Orders          Ordered    predniSONE (DELTASONE) 10 MG tablet  Daily        11/10/20 [id number], [place] R, PA-C 11/10/20 [place], [place], DO 11/10/20 2317

## 2020-11-10 NOTE — ED Provider Notes (Signed)
I personally evaluated the patient during the encounter and completed a history, physical, procedures, medical decision making to contribute to the overall care of the patient and decision making for the patient briefly, the patient is a 75 y.o. female here with left toe pain.  Pain to the left big toe.  Has been ongoing for about 2 weeks.  She is placed in a postop shoe by her podiatrist last week and has follow-up next week.  Overall there is some mild tenderness to the left MTP of the big toe.  Suspect may be pseudogout versus gout although there is no major swelling or deformity.  We will do prednisone and she has been doing well with Voltaren gel otherwise.  Have a low suspicion for infectious process given no fever, ongoing pain for over 2 weeks.  Recommend close follow-up with podiatrist.  Understands return if symptoms worsen.  This chart was dictated using voice recognition software.  Despite best efforts to proofread,  errors can occur which can change the documentation meaning.    EKG Interpretation None             Lennice Sites, DO 11/10/20 1539

## 2020-11-10 NOTE — ED Triage Notes (Signed)
Pt reports left foot pain and swelling x2 weeks. Denies any injury

## 2020-11-10 NOTE — Discharge Instructions (Addendum)
You were seen in the ED for left foot pain and swelling.  We are sending you  home with prednisone, a steroid, to take for the next 5 days.  This is to help with pain and swelling.  This medication can elevate your blood sugar therefore be sure to check it very closely with times per day.  If it starts to become elevated please stop utilizing the prednisone.  We have prescribed you new medication(s) today. Discuss the medications prescribed today with your pharmacist as they can have adverse effects and interactions with your other medicines including over the counter and prescribed medications. Seek medical evaluation if you start to experience new or abnormal symptoms after taking one of these medicines, seek care immediately if you start to experience difficulty breathing, feeling of your throat closing, facial swelling, or rash as these could be indications of a more serious allergic reaction  Please call your podiatrist today to schedule close follow-up within the next few days.  Return to the ER for new or worsening symptoms including but not limited to new or worsening pain, change in color, fever, redness, wound at the site, numbness, weakness, or any other concerns.

## 2020-12-03 ENCOUNTER — Other Ambulatory Visit: Payer: Self-pay

## 2020-12-03 ENCOUNTER — Ambulatory Visit: Payer: Medicare Other | Admitting: Internal Medicine

## 2020-12-03 ENCOUNTER — Encounter: Payer: Self-pay | Admitting: Internal Medicine

## 2020-12-03 VITALS — BP 130/60 | HR 77 | Temp 98.2°F | Resp 18 | Ht 64.0 in | Wt 158.8 lb

## 2020-12-03 DIAGNOSIS — M109 Gout, unspecified: Secondary | ICD-10-CM | POA: Diagnosis not present

## 2020-12-03 DIAGNOSIS — R7303 Prediabetes: Secondary | ICD-10-CM | POA: Diagnosis not present

## 2020-12-03 DIAGNOSIS — I10 Essential (primary) hypertension: Secondary | ICD-10-CM | POA: Diagnosis not present

## 2020-12-03 DIAGNOSIS — N1832 Chronic kidney disease, stage 3b: Secondary | ICD-10-CM | POA: Diagnosis not present

## 2020-12-03 MED ORDER — INDOMETHACIN 50 MG PO CAPS: 50.0000 mg | ORAL_CAPSULE | Freq: Three times a day (TID) | ORAL | 5 refills | Status: DC

## 2020-12-03 MED ORDER — ALLOPURINOL 100 MG PO TABS: 100.0000 mg | ORAL_TABLET | Freq: Every day | ORAL | 3 refills | Status: DC

## 2020-12-03 NOTE — Progress Notes (Signed)
Patient ID: Katie [name], female   DOB: 1945-09-04, 75 y.o.   MRN: [id number]        Chief Complaint: follow up left toe joint pain, DM,        HPI:  Katie [name] is a 75 y.o. female here with c/o 2 wk onset left great toe joint pain, red swelling similar to prior episode acute gouty arthritis in past; and no fever, trauma.  Was originally seen in ED on June 9.  Pt denies chest pain, increased sob or doe, wheezing, orthopnea, PND, increased LE swelling, palpitations, dizziness or syncope.   Pt denies polydipsia, polyuria, or new focal neuro s/s.    Pt also with hx of PreDM, and declines any type of steroid tx today.  No other new complaints Wt Readings from Last 3 Encounters:  12/03/20 158 lb 12.8 oz (72 kg)  10/04/20 161 lb 11.2 oz (73.3 kg)  09/08/20 163 lb 3.2 oz (74 kg)   BP Readings from Last 3 Encounters:  12/03/20 130/60  11/10/20 103/68  10/04/20 123/68         Past Medical History:  Diagnosis Date   Acute venous embolism and thrombosis of unspecified deep vessels of lower extremity 2001   RLE DVT and PE, coumadin x 54mo, now ASSA   Anxiety state, unspecified    Chronic idiopathic constipation 11/02/2016   Degeneration of cervical intervertebral disc    Gallstones    History of DVT (deep vein thrombosis)    Iron deficiency anemia secondary to blood loss (chronic)    Irritable bowel syndrome    Lumbago    Other B-complex deficiencies    Personal history of colonic polyps    adenomatous   Pure hypercholesterolemia    Reflux esophagitis    Tubular adenoma of colon 09/2002   Type II or unspecified type diabetes mellitus without mention of complication, not stated as uncontrolled    Unspecified essential hypertension    Unspecified hypothyroidism    Unspecified iridocyclitis    Unspecified venous (peripheral) insufficiency    Unspecified vitamin D deficiency    Past Surgical History:  Procedure Laterality Date   BIOPSY  04/24/2018   Procedure: BIOPSY;  Surgeon: Irving Copas., MD;  Location: [facility];  Service: Gastroenterology;;   Wilmon Pali RELEASE     CATARACT EXTRACTION Bilateral 08/29/2017,09/19/17   ESOPHAGOGASTRODUODENOSCOPY (EGD) WITH PROPOFOL N/A 04/24/2018   Procedure: ESOPHAGOGASTRODUODENOSCOPY (EGD) WITH PROPOFOL;  Surgeon: Irving Copas., MD;  Location: [facility];  Service: Gastroenterology;  Laterality: N/A;  with EMR   FEMORAL HERNIA REPAIR     OOPHORECTOMY     POLYPECTOMY  04/24/2018   Procedure: POLYPECTOMY;  Surgeon: Mansouraty, Telford Nab., MD;  Location: [facility];  Service: Gastroenterology;;   UPPER ESOPHAGEAL ENDOSCOPIC ULTRASOUND (EUS)  04/24/2018   Procedure: UPPER ESOPHAGEAL ENDOSCOPIC ULTRASOUND (EUS);  Surgeon: Rush Landmark Telford Nab., MD;  Location: [facility];  Service: Gastroenterology;;   VESICOVAGINAL FISTULA CLOSURE W/ TAH      reports that she has never smoked. She has never used smokeless tobacco. She reports current alcohol use. She reports that she does not use drugs. family history includes Deep vein thrombosis in her mother; Diabetes in her mother; Healthy in her sister; Hypertension in her mother; Prostate cancer in her father. Allergies  Allergen Reactions   Lisinopril Other (See Comments)    Cough and throat clearing   Metoprolol Succinate     Unknown reaction   Penicillins Swelling    Has patient had  a PCN reaction causing immediate rash, facial/tongue/throat swelling, SOB or lightheadedness with hypotension: Yes Has patient had a PCN reaction causing severe rash involving mucus membranes or skin necrosis: No Has patient had a PCN reaction that required hospitalization: No Has patient had a PCN reaction occurring within the last 10 years: No If all of the above answers are "NO", then may proceed with Cephalosporin use.    Sulfamethoxazole    Sulfamethoxazole-Trimethoprim     Unknown reaction   Trimethoprim    Metformin Nausea Only   Current Outpatient Medications on File Prior  to Visit  Medication Sig Dispense Refill   ACCU-CHEK AVIVA PLUS test strip      ALPRAZolam (XANAX) 0.5 MG tablet TAKE 1 TABLET BY MOUTH 3  TIMES DAILY AS NEEDED FOR  ANXIETY OR SLEEP. 270 tablet 1   aspirin EC 81 MG tablet Take 1 tablet (81 mg total) by mouth daily. 90 tablet 1   BIOTIN 5000 PO Take by mouth.     cyanocobalamin (,VITAMIN B-12,) 1000 MCG/ML injection INJECT 1 ML  ONCE EVERY MONTH 3 mL 0   Digestive Enzymes (DIGESTIVE ENZYME PO) Take 1 capsule by mouth daily.     Dulaglutide (TRULICITY) 1.5 WC/5.8NI SOPN Inject into the skin every 14 (fourteen) days.     Ergocalciferol (VITAMIN D2 PO) Take by mouth.     famotidine (PEPCID) 20 MG tablet Take 2 tablets (40 mg total) by mouth daily. 180 tablet 1   gabapentin (NEURONTIN) 100 MG capsule Take 1 capsule (100 mg total) by mouth 3 (three) times daily. (Patient taking differently: Take 100 mg by mouth 3 (three) times daily as needed.) 90 capsule 5   indapamide (LOZOL) 1.25 MG tablet Take 1 tablet by mouth once daily 90 tablet 1   Lancet Devices (SIMPLE DIAGNOSTICS LANCING DEV) MISC      levothyroxine (SYNTHROID, LEVOTHROID) 88 MCG tablet Take 88 mcg by mouth daily.     LINZESS 290 MCG CAPS capsule TAKE 1 CAPSULE BY MOUTH ONCE DAILY BEFORE BREAKFAST 30 capsule 5   Propylene Glycol 0.6 % SOLN Take 1 application by mouth daily as needed.     rosuvastatin (CRESTOR) 10 MG tablet Take 10 mg by mouth daily.     No current facility-administered medications on file prior to visit.        ROS:  All others reviewed and negative.  Objective        PE:  BP 130/60   Pulse 77   Temp 98.2 F (36.8 C) (Oral)   Resp 18   Ht 5\' 4"  (1.626 m)   Wt 158 lb 12.8 oz (72 kg)   SpO2 97%   BMI 27.26 kg/m                 Constitutional: Pt appears in NAD               HENT: Head: NCAT.                Right Ear: External ear normal.                 Left Ear: External ear normal.                Eyes: . Pupils are equal, round, and reactive to light.  Conjunctivae and EOM are normal               Nose: without d/c or deformity  Neck: Neck supple. Gross normal ROM               Cardiovascular: Normal rate and regular rhythm.                 Pulmonary/Chest: Effort normal and breath sounds without rales or wheezing.                Left great toe first MTP with 1+ red, tender, swelling o/w neuro vasc intact               Neurological: Pt is alert. At baseline orientation, motor grossly intact               Skin: Skin is warm. No rashes, no other new lesions, LE edema - none               Psychiatric: Pt behavior is normal without agitation   Micro: none  Cardiac tracings I have personally interpreted today:  none  Pertinent Radiological findings (summarize): none   Lab Results  Component Value Date   WBC 4.5 09/17/2020   HGB 10.3 (L) 09/17/2020   HCT 30.6 (L) 09/17/2020   PLT 177 09/17/2020   GLUCOSE 111 (H) 09/17/2020   CHOL 128 05/24/2020   TRIG 100.0 05/24/2020   HDL 65.00 05/24/2020   LDLDIRECT 105.9 07/04/2007   LDLCALC 43 05/24/2020   ALT 10 09/17/2020   AST 21 09/17/2020   NA 143 09/17/2020   K 3.8 09/17/2020   CL 104 09/17/2020   CREATININE 1.51 (H) 09/17/2020   BUN 19 09/17/2020   CO2 29 09/17/2020   TSH 1.22 05/24/2020   HGBA1C 5.9 05/24/2020   MICROALBUR <0.7 12/30/2018   Assessment/Plan:  Korinna Tat is a 75 y.o. Black or African American [2] female with  has a past medical history of Acute venous embolism and thrombosis of unspecified deep vessels of lower extremity (2001), Anxiety state, unspecified, Chronic idiopathic constipation (11/02/2016), Degeneration of cervical intervertebral disc, Gallstones, History of DVT (deep vein thrombosis), Iron deficiency anemia secondary to blood loss (chronic), Irritable bowel syndrome, Lumbago, Other B-complex deficiencies, Personal history of colonic polyps, Pure hypercholesterolemia, Reflux esophagitis, Tubular adenoma of colon (09/2002), Type II or unspecified  type diabetes mellitus without mention of complication, not stated as uncontrolled, Unspecified essential hypertension, Unspecified hypothyroidism, Unspecified iridocyclitis, Unspecified venous (peripheral) insufficiency, and Unspecified vitamin D deficiency.  Prediabetes Lab Results  Component Value Date   HGBA1C 5.9 05/24/2020   Stable, pt to continue current medical treatment  - trulicity   Essential hypertension BP Readings from Last 3 Encounters:  12/03/20 130/60  11/10/20 103/68  10/04/20 123/68   Stable, pt to continue medical treatment  - lozol   Chronic renal disease, stage 3, moderately decreased glomerular filtration rate (GFR) between 30-59 mL/min/1.73 square meter ([facility]) Lab Results  Component Value Date   CREATININE 1.51 (H) 09/17/2020   Stable overall, cont to avoid nephrotoxins   Acute gouty arthritis With some improvement in last few days since original diagnosis June 9; declines steroid tx, now for infrequent prn indocin use only, and start allopurinol 100 qd; consider podiatry if not improved to r/o bunion  Followup: Return if symptoms worsen or fail to improve.  Cathlean Cower, MD 12/04/2020 1:35 PM [facility]

## 2020-12-03 NOTE — Patient Instructions (Signed)
Please take all new medication as prescribed - the indocin as needed for pain (anti-inflammatory), and the allopurinol 100 mg per day  You can continue the OTC topical Volatren gel as needed  Please continue all other medications as before, and refills have been done if requested.  Please have the pharmacy call with any other refills you may need.  Please keep your appointments with your specialists as you may have planned

## 2020-12-04 ENCOUNTER — Encounter: Payer: Self-pay | Admitting: Internal Medicine

## 2020-12-04 NOTE — Assessment & Plan Note (Signed)
Lab Results  Component Value Date   HGBA1C 5.9 05/24/2020   Stable, pt to continue current medical treatment  - trulicity

## 2020-12-04 NOTE — Assessment & Plan Note (Addendum)
With some improvement in last few days since original diagnosis June 9; declines steroid tx, now for infrequent prn indocin use only, and start allopurinol 100 qd; consider podiatry if not improved to r/o bunion

## 2020-12-04 NOTE — Assessment & Plan Note (Signed)
BP Readings from Last 3 Encounters:  12/03/20 130/60  11/10/20 103/68  10/04/20 123/68   Stable, pt to continue medical treatment  - lozol

## 2020-12-04 NOTE — Assessment & Plan Note (Signed)
Lab Results  Component Value Date   CREATININE 1.51 (H) 09/17/2020   Stable overall, cont to avoid nephrotoxins

## 2020-12-07 ENCOUNTER — Telehealth: Payer: Self-pay | Admitting: Internal Medicine

## 2020-12-07 NOTE — Telephone Encounter (Signed)
    Pt c/o medication issue:  1. Name of Medication: indomethacin (INDOCIN) 50 MG capsule  2. How are you currently taking this medication (dosage and times per day)? Take 1 capsule (50 mg total) by mouth 3 (three) times daily with meals., Starting Fri 12/03/2020  3. Are you having a reaction (difficulty breathing--STAT)? N/A  4. What is your medication issue? Patient states med be causing her dizziness, decreased urination since starting on 7/22

## 2020-12-08 ENCOUNTER — Telehealth: Payer: Self-pay | Admitting: Internal Medicine

## 2020-12-08 NOTE — Telephone Encounter (Signed)
Patient calling back about previous message left yesterday.  Patient says medication: indomethacin (INDOCIN) 50 MG capsule is making her have severe headaches & she wants to speak to Dr. Ronnald Ramp about it  Callback # [phone]

## 2020-12-08 NOTE — Telephone Encounter (Signed)
Please advise. I have inquired about a buffer add on for this pt on a previous phone encounter.

## 2020-12-09 NOTE — Telephone Encounter (Signed)
Pt is scheduled for 8/1 @ 9.20am  She could not come today due to prior arrangements in her schedule.

## 2020-12-13 ENCOUNTER — Encounter: Payer: Self-pay | Admitting: Internal Medicine

## 2020-12-13 ENCOUNTER — Ambulatory Visit: Payer: Medicare Other | Admitting: Internal Medicine

## 2020-12-13 ENCOUNTER — Other Ambulatory Visit: Payer: Self-pay

## 2020-12-13 VITALS — BP 124/76 | HR 81 | Temp 98.5°F | Resp 16 | Ht 64.0 in | Wt 157.0 lb

## 2020-12-13 DIAGNOSIS — E039 Hypothyroidism, unspecified: Secondary | ICD-10-CM

## 2020-12-13 DIAGNOSIS — T502X5A Adverse effect of carbonic-anhydrase inhibitors, benzothiadiazides and other diuretics, initial encounter: Secondary | ICD-10-CM

## 2020-12-13 DIAGNOSIS — N1832 Chronic kidney disease, stage 3b: Secondary | ICD-10-CM

## 2020-12-13 DIAGNOSIS — I10 Essential (primary) hypertension: Secondary | ICD-10-CM | POA: Diagnosis not present

## 2020-12-13 DIAGNOSIS — R7303 Prediabetes: Secondary | ICD-10-CM

## 2020-12-13 DIAGNOSIS — M1A3721 Chronic gout due to renal impairment, left ankle and foot, with tophus (tophi): Secondary | ICD-10-CM | POA: Diagnosis not present

## 2020-12-13 DIAGNOSIS — E538 Deficiency of other specified B group vitamins: Secondary | ICD-10-CM

## 2020-12-13 DIAGNOSIS — E876 Hypokalemia: Secondary | ICD-10-CM

## 2020-12-13 LAB — BASIC METABOLIC PANEL
BUN: 15 mg/dL (ref 6–23)
CO2: 30 mEq/L (ref 19–32)
Calcium: 9.3 mg/dL (ref 8.4–10.5)
Chloride: 101 mEq/L (ref 96–112)
Creatinine, Ser: 1.6 mg/dL — ABNORMAL HIGH (ref 0.40–1.20)
GFR: 31.26 mL/min — ABNORMAL LOW (ref >60.00)
Glucose, Bld: 112 mg/dL — ABNORMAL HIGH (ref 70–99)
Potassium: 3.2 mEq/L — ABNORMAL LOW (ref 3.5–5.1)
Sodium: 139 mEq/L (ref 135–145)

## 2020-12-13 LAB — CBC WITH DIFFERENTIAL/PLATELET
Basophils Absolute: 0 10*3/uL (ref 0.0–0.1)
Basophils Relative: 0.6 % (ref 0.0–3.0)
Eosinophils Absolute: 0.2 10*3/uL (ref 0.0–0.7)
Eosinophils Relative: 3.8 % (ref 0.0–5.0)
HCT: 28 % — ABNORMAL LOW (ref 36.0–46.0)
Hemoglobin: 9.2 g/dL — ABNORMAL LOW (ref 12.0–15.0)
Lymphocytes Relative: 34.6 % (ref 12.0–46.0)
Lymphs Abs: 1.6 10*3/uL (ref 0.7–4.0)
MCHC: 32.9 g/dL (ref 30.0–36.0)
MCV: 68.4 fl — ABNORMAL LOW (ref 78.0–100.0)
Monocytes Absolute: 0.5 10*3/uL (ref 0.1–1.0)
Monocytes Relative: 10.8 % (ref 3.0–12.0)
Neutro Abs: 2.3 10*3/uL (ref 1.4–7.7)
Neutrophils Relative %: 50.2 % (ref 43.0–77.0)
Platelets: 234 10*3/uL (ref 150.0–400.0)
RBC: 4.09 Mil/uL (ref 3.87–5.11)
RDW: 15.7 % — ABNORMAL HIGH (ref 11.5–15.5)
WBC: 4.6 10*3/uL (ref 4.0–10.5)

## 2020-12-13 LAB — URIC ACID: Uric Acid, Serum: 7.1 mg/dL — ABNORMAL HIGH (ref 2.4–7.0)

## 2020-12-13 LAB — TSH: TSH: 1.93 u[IU]/mL (ref 0.35–5.50)

## 2020-12-13 LAB — HEMOGLOBIN A1C: Hgb A1c MFr Bld: 6.2 % (ref 4.6–6.5)

## 2020-12-13 NOTE — Patient Instructions (Signed)
Gout  Gout is a condition that causes painful swelling of the joints. Gout is a type of inflammation of the joints (arthritis). This condition is caused by having too much uric acid in the body. Uric acid is a chemical that forms when the body breaks down substances called purines.Purines are important for building body proteins. When the body has too much uric acid, sharp crystals can form and build up inside the joints. This causes pain and swelling. Gout attacks can happen quickly and may be very painful (acute gout). Over time, the attacks can affect more joints and become more frequent (chronic gout). Gout can also cause uric acid to build up under the skin and inside thekidneys. What are the causes? This condition is caused by too much uric acid in your blood. This can happen because: Your kidneys do not remove enough uric acid from your blood. This is the most common cause. Your body makes too much uric acid. This can happen with some cancers and cancer treatments. It can also occur if your body is breaking down too many red blood cells (hemolytic anemia). You eat too many foods that are high in purines. These foods include organ meats and some seafood. Alcohol, especially beer, is also high in purines. A gout attack may be triggered by trauma or stress. What increases the risk? You are more likely to develop this condition if you: Have a family history of gout. Are female and middle-aged. Are female and have gone through menopause. Are obese. Frequently drink alcohol, especially beer. Are dehydrated. Lose weight too quickly. Have an organ transplant. Have lead poisoning. Take certain medicines, including aspirin, cyclosporine, diuretics, levodopa, and niacin. Have kidney disease. Have a skin condition called psoriasis. What are the signs or symptoms? An attack of acute gout happens quickly. It usually occurs in just one joint. The most common place is the big toe. Attacks often start  at night. Other joints that may be affected include joints of the feet, ankle, knee, fingers, wrist, or elbow. Symptoms of this condition may include: Severe pain. Warmth. Swelling. Stiffness. Tenderness. The affected joint may be very painful to touch. Shiny, red, or purple skin. Chills and fever. Chronic gout may cause symptoms more frequently. More joints may be involved. You may also have white or yellow lumps (tophi) on your hands or feet or in other areas near your joints. How is this diagnosed? This condition is diagnosed based on your symptoms, medical history, and physical exam. You may have tests, such as: Blood tests to measure uric acid levels. Removal of joint fluid with a thin needle (aspiration) to look for uric acid crystals. X-rays to look for joint damage. How is this treated? Treatment for this condition has two phases: treating an acute attack and preventing future attacks. Acute gout treatment may include medicines to reduce pain and swelling, including: NSAIDs. Steroids. These are strong anti-inflammatory medicines that can be taken by mouth (orally) or injected into a joint. Colchicine. This medicine relieves pain and swelling when it is taken soon after an attack. It can be given by mouth or through an IV. Preventive treatment may include: Daily use of smaller doses of NSAIDs or colchicine. Use of a medicine that reduces uric acid levels in your blood. Changes to your diet. You may need to see a dietitian about what to eat and drink to prevent gout. Follow these instructions at home: During a gout attack  If directed, put ice on the affected area: Put   ice in a plastic bag. Place a towel between your skin and the bag. Leave the ice on for 20 minutes, 2-3 times a day. Raise (elevate) the affected joint above the level of your heart as often as possible. Rest the joint as much as possible. If the affected joint is in your leg, you may be given crutches to  use. Follow instructions from your health care provider about eating or drinking restrictions.  Avoiding future gout attacks Follow a low-purine diet as told by your dietitian or health care provider. Avoid foods and drinks that are high in purines, including liver, kidney, anchovies, asparagus, herring, mushrooms, mussels, and beer. Maintain a healthy weight or lose weight if you are overweight. If you want to lose weight, talk with your health care provider. It is important that you do not lose weight too quickly. Start or maintain an exercise program as told by your health care provider. Eating and drinking Drink enough fluids to keep your urine pale yellow. If you drink alcohol: Limit how much you use to: 0-1 drink a day for women. 0-2 drinks a day for men. Be aware of how much alcohol is in your drink. In the [place] one drink equals one 12 oz bottle of beer (355 mL) one 5 oz glass of wine (148 mL), or one 1 oz glass of hard liquor (44 mL). General instructions Take over-the-counter and prescription medicines only as told by your health care provider. Do not drive or use heavy machinery while taking prescription pain medicine. Return to your normal activities as told by your health care provider. Ask your health care provider what activities are safe for you. Keep all follow-up visits as told by your health care provider. This is important. Contact a health care provider if you have: Another gout attack. Continuing symptoms of a gout attack after 10 days of treatment. Side effects from your medicines. Chills or a fever. Burning pain when you urinate. Pain in your lower back or belly. Get help right away if you: Have severe or uncontrolled pain. Cannot urinate. Summary Gout is painful swelling of the joints caused by inflammation. The most common site of pain is the big toe, but it can affect other joints in the body. Medicines and dietary changes can help to prevent and treat gout  attacks. This information is not intended to replace advice given to you by your health care provider. Make sure you discuss any questions you have with your healthcare provider. Document Revised: 11/21/2017 Document Reviewed: 11/21/2017 Elsevier Patient Education  2022 Elsevier Inc.  

## 2020-12-13 NOTE — Progress Notes (Signed)
Subjective:  Patient ID: Katie [name], female    DOB: 01-29-1946  Age: 75 y.o. MRN: [id number]  CC: Hypothyroidism  This visit occurred during the SARS-CoV-2 public health emergency.  Safety protocols were in place, including screening questions prior to the visit, additional usage of staff PPE, and extensive cleaning of exam room while observing appropriate contact time as indicated for disinfecting solutions.    HPI Katie [name] presents for f/up -  She has had pain and swelling in her left first MTP joint for several weeks.  She saw another PCP and indomethacin was started.  She has seen a podiatrist.  This is presumed to be gout.  She tells me she is taking allopurinol.  She tells me that indomethacin is caused headache and nausea.  She denies chest pain, shortness of breath, dizziness, or lightheadedness.  Outpatient Medications Prior to Visit  Medication Sig Dispense Refill   ACCU-CHEK AVIVA PLUS test strip      allopurinol (ZYLOPRIM) 100 MG tablet Take 1 tablet (100 mg total) by mouth daily. 90 tablet 3   ALPRAZolam (XANAX) 0.5 MG tablet TAKE 1 TABLET BY MOUTH 3  TIMES DAILY AS NEEDED FOR  ANXIETY OR SLEEP. 270 tablet 1   BIOTIN 5000 PO Take by mouth.     cyanocobalamin (,VITAMIN B-12,) 1000 MCG/ML injection INJECT 1 ML  ONCE EVERY MONTH 3 mL 0   Digestive Enzymes (DIGESTIVE ENZYME PO) Take 1 capsule by mouth daily.     Ergocalciferol (VITAMIN D2 PO) Take by mouth.     famotidine (PEPCID) 20 MG tablet Take 2 tablets (40 mg total) by mouth daily. 180 tablet 1   gabapentin (NEURONTIN) 100 MG capsule Take 1 capsule (100 mg total) by mouth 3 (three) times daily. (Patient taking differently: Take 100 mg by mouth 3 (three) times daily as needed.) 90 capsule 5   Lancet Devices (SIMPLE DIAGNOSTICS LANCING DEV) MISC      levothyroxine (SYNTHROID, LEVOTHROID) 88 MCG tablet Take 88 mcg by mouth daily.     LINZESS 290 MCG CAPS capsule TAKE 1 CAPSULE BY MOUTH ONCE DAILY BEFORE BREAKFAST 30 capsule 5    Propylene Glycol 0.6 % SOLN Take 1 application by mouth daily as needed.     rosuvastatin (CRESTOR) 10 MG tablet Take 10 mg by mouth daily.     aspirin EC 81 MG tablet Take 1 tablet (81 mg total) by mouth daily. 90 tablet 1   Dulaglutide (TRULICITY) 1.5 VC/9.4WH SOPN Inject into the skin every 14 (fourteen) days.     indapamide (LOZOL) 1.25 MG tablet Take 1 tablet by mouth once daily 90 tablet 1   indomethacin (INDOCIN) 50 MG capsule Take 1 capsule (50 mg total) by mouth 3 (three) times daily with meals. 90 capsule 5   No facility-administered medications prior to visit.    ROS Review of Systems  Constitutional:  Negative for diaphoresis and fatigue.  HENT: Negative.    Respiratory: Negative.  Negative for cough, chest tightness, shortness of breath and wheezing.   Cardiovascular:  Negative for chest pain, palpitations and leg swelling.  Gastrointestinal:  Positive for nausea. Negative for abdominal pain, constipation, diarrhea and vomiting.  Endocrine: Negative for cold intolerance and heat intolerance.  Genitourinary: Negative.  Negative for difficulty urinating and hematuria.  Musculoskeletal:  Positive for arthralgias. Negative for back pain.  Skin: Negative.   Neurological:  Positive for headaches. Negative for dizziness and weakness.  Hematological:  Negative for adenopathy. Does not bruise/bleed easily.  Psychiatric/Behavioral:  Negative.     Objective:  BP 124/76 (BP Location: Left Arm, Patient Position: Sitting, Cuff Size: Large)   Pulse 81   Temp 98.5 F (36.9 C) (Oral)   Resp 16   Ht 5\' 4"  (1.626 m)   Wt 157 lb (71.2 kg)   SpO2 98%   BMI 26.95 kg/m   BP Readings from Last 3 Encounters:  12/13/20 124/76  12/03/20 130/60  11/10/20 103/68    Wt Readings from Last 3 Encounters:  12/13/20 157 lb (71.2 kg)  12/03/20 158 lb 12.8 oz (72 kg)  10/04/20 161 lb 11.2 oz (73.3 kg)    Physical Exam Vitals reviewed.  HENT:     Nose: Nose normal.     Mouth/Throat:      Mouth: Mucous membranes are moist.  Eyes:     General: No scleral icterus. Cardiovascular:     Rate and Rhythm: Normal rate and regular rhythm.     Pulses:          Dorsalis pedis pulses are 1+ on the right side and 1+ on the left side.       Posterior tibial pulses are 1+ on the right side and 1+ on the left side.     Heart sounds: No murmur heard. Pulmonary:     Effort: Pulmonary effort is normal.     Breath sounds: No stridor. No wheezing, rhonchi or rales.  Abdominal:     General: Abdomen is flat.     Palpations: There is no mass.     Tenderness: There is no abdominal tenderness.  Musculoskeletal:        General: Normal range of motion.     Cervical back: Neck supple.       Feet:  Lymphadenopathy:     Cervical: No cervical adenopathy.  Skin:    General: Skin is warm and dry.  Neurological:     General: No focal deficit present.     Mental Status: She is alert.  Psychiatric:        Mood and Affect: Mood normal.    Lab Results  Component Value Date   WBC 4.6 12/13/2020   HGB 9.2 (L) 12/13/2020   HCT 28.0 (L) 12/13/2020   PLT 234.0 12/13/2020   GLUCOSE 112 (H) 12/13/2020   CHOL 128 05/24/2020   TRIG 100.0 05/24/2020   HDL 65.00 05/24/2020   LDLDIRECT 105.9 07/04/2007   LDLCALC 43 05/24/2020   ALT 10 09/17/2020   AST 21 09/17/2020   NA 139 12/13/2020   K 3.2 (L) 12/13/2020   CL 101 12/13/2020   CREATININE 1.60 (H) 12/13/2020   BUN 15 12/13/2020   CO2 30 12/13/2020   TSH 1.93 12/13/2020   HGBA1C 6.2 12/13/2020   MICROALBUR <0.7 12/30/2018    No results found.  Assessment & Plan:   Katie [name] was seen today for hypothyroidism.  Diagnoses and all orders for this visit:  Essential hypertension- Her blood pressure is somewhat overcontrolled and she has a low potassium level.  Will discontinue the thiazide diuretic and will start a potassium supplement. -     Basic metabolic panel; Future -     TSH; Future -     Urinalysis, Routine w reflex microscopic;  Future -     Urinalysis, Routine w reflex microscopic -     TSH -     Basic metabolic panel -     potassium chloride (KLOR-CON) 8 MEQ tablet; Take 1 tablet (8 mEq total) by mouth 2 (  two) times daily.  Acquired hypothyroidism- Her TSH is in the normal range.  She will stay on the current dose of levothyroxine. -     TSH; Future -     TSH  Stage 3b chronic kidney disease ([place])- Her renal function has declined some.  I recommended that she stop taking NSAIDs. 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 -     Basic metabolic panel; Future -     Uric acid; Future -     Uric acid -     Basic metabolic panel  Prediabetes- Her A1c is 6.2%.  Medical therapy is not indicated. -     Hemoglobin A1c; Future -     Hemoglobin A1c  Diuretic-induced hypokalemia -     potassium chloride (KLOR-CON) 8 MEQ tablet; Take 1 tablet (8 mEq total) by mouth 2 (two) times daily.  I have discontinued Wilbert Suchy's aspirin EC, Trulicity, indapamide, and indomethacin. I am also having her start on potassium chloride. Additionally, I am having her maintain her levothyroxine, Accu-Chek Aviva Plus, [employer] Lancing Dev, rosuvastatin, BIOTIN 5000 PO, Ergocalciferol (VITAMIN D2 PO), gabapentin, Digestive Enzymes (DIGESTIVE ENZYME PO), Linzess, famotidine, Propylene Glycol, ALPRAZolam, cyanocobalamin, and allopurinol.  Meds ordered this encounter  Medications   potassium chloride (KLOR-CON) 8 MEQ tablet    Sig: Take 1 tablet (8 mEq total) by mouth 2 (two) times daily.    Dispense:  180 tablet    Refill:  0      Follow-up: Return in about 3 months (around 03/15/2021).  Scarlette Calico, MD

## 2020-12-16 ENCOUNTER — Encounter: Payer: Self-pay | Admitting: Internal Medicine

## 2020-12-16 DIAGNOSIS — E876 Hypokalemia: Secondary | ICD-10-CM | POA: Insufficient documentation

## 2020-12-16 MED ORDER — POTASSIUM CHLORIDE ER 8 MEQ PO TBCR: 8.0000 meq | EXTENDED_RELEASE_TABLET | Freq: Two times a day (BID) | ORAL | 0 refills | Status: DC

## 2021-01-03 ENCOUNTER — Other Ambulatory Visit: Payer: Self-pay | Admitting: Internal Medicine

## 2021-01-03 DIAGNOSIS — E538 Deficiency of other specified B group vitamins: Secondary | ICD-10-CM

## 2021-02-02 ENCOUNTER — Ambulatory Visit: Payer: Medicare Other

## 2021-02-11 ENCOUNTER — Ambulatory Visit: Payer: Medicare Other

## 2021-02-17 ENCOUNTER — Encounter (HOSPITAL_COMMUNITY): Payer: Medicare Other

## 2021-02-18 ENCOUNTER — Ambulatory Visit (INDEPENDENT_AMBULATORY_CARE_PROVIDER_SITE_OTHER): Payer: Medicare Other

## 2021-02-18 ENCOUNTER — Other Ambulatory Visit: Payer: Self-pay

## 2021-02-18 VITALS — BP 132/80 | HR 104 | Temp 98.4°F | Ht 64.0 in | Wt 162.6 lb

## 2021-02-18 DIAGNOSIS — Z0000 Encounter for general adult medical examination without abnormal findings: Secondary | ICD-10-CM | POA: Diagnosis not present

## 2021-02-18 NOTE — Patient Instructions (Signed)
Katie [name] , Thank you for taking time to come for your Medicare Wellness Visit. I appreciate your ongoing commitment to your health goals. Please review the following plan we discussed and let me know if I can assist you in the future.   Screening recommendations/referrals: Colonoscopy: 01/30/2019; due every 7 years Mammogram: 05/26/2020; due every year Bone Density: 11/13/2016; completed Recommended yearly ophthalmology/optometry visit for glaucoma screening and checkup Recommended yearly dental visit for hygiene and checkup  Vaccinations: Influenza vaccine: 01/25/2021 Pneumococcal vaccine: 08/11/2014, 06/18/2017 Tdap vaccine: 03/16/2015; due every 10 years Shingles vaccine: 01/26/2017, 05/09/2017   Covid-19: 06/21/2019, 07/16/2019, 03/27/2020  Advanced directives: Advance directive discussed with you today. Even though you declined this today please call our office should you change your mind and we can give you the proper paperwork for you to fill out.  Conditions/risks identified: Yes; Client understands the importance of follow-up with providers by attending scheduled visits and discussed goals to eat healthier, increase physical activity, exercise the brain, socialize more, get enough sleep and make time for laughter.  Next appointment: Please schedule your next Medicare Wellness Visit with your Nurse Health Advisor in 1 year by calling [phone].   Preventive Care 35 Years and Older, Female Preventive care refers to lifestyle choices and visits with your health care provider that can promote health and wellness. What does preventive care include? A yearly physical exam. This is also called an annual well check. Dental exams once or twice a year. Routine eye exams. Ask your health care provider how often you should have your eyes checked. Personal lifestyle choices, including: Daily care of your teeth and gums. Regular physical activity. Eating a healthy diet. Avoiding tobacco and drug  use. Limiting alcohol use. Practicing safe sex. Taking low-dose aspirin every day. Taking vitamin and mineral supplements as recommended by your health care provider. What happens during an annual well check? The services and screenings done by your health care provider during your annual well check will depend on your age, overall health, lifestyle risk factors, and family history of disease. Counseling  Your health care provider may ask you questions about your: Alcohol use. Tobacco use. Drug use. Emotional well-being. Home and relationship well-being. Sexual activity. Eating habits. History of falls. Memory and ability to understand (cognition). Work and work Statistician. Reproductive health. Screening  You may have the following tests or measurements: Height, weight, and BMI. Blood pressure. Lipid and cholesterol levels. These may be checked every 5 years, or more frequently if you are over 71 years old. Skin check. Lung cancer screening. You may have this screening every year starting at age 39 if you have a 30-pack-year history of smoking and currently smoke or have quit within the past 15 years. Fecal occult blood test (FOBT) of the stool. You may have this test every year starting at age 32. Flexible sigmoidoscopy or colonoscopy. You may have a sigmoidoscopy every 5 years or a colonoscopy every 10 years starting at age 61. Hepatitis C blood test. Hepatitis B blood test. Sexually transmitted disease (STD) testing. Diabetes screening. This is done by checking your blood sugar (glucose) after you have not eaten for a while (fasting). You may have this done every 1-3 years. Bone density scan. This is done to screen for osteoporosis. You may have this done starting at age 67. Mammogram. This may be done every 1-2 years. Talk to your health care provider about how often you should have regular mammograms. Talk with your health care provider about your test  results, treatment  options, and if necessary, the need for more tests. Vaccines  Your health care provider may recommend certain vaccines, such as: Influenza vaccine. This is recommended every year. Tetanus, diphtheria, and acellular pertussis (Tdap, Td) vaccine. You may need a Td booster every 10 years. Zoster vaccine. You may need this after age 80. Pneumococcal 13-valent conjugate (PCV13) vaccine. One dose is recommended after age 51. Pneumococcal polysaccharide (PPSV23) vaccine. One dose is recommended after age 69. Talk to your health care provider about which screenings and vaccines you need and how often you need them. This information is not intended to replace advice given to you by your health care provider. Make sure you discuss any questions you have with your health care provider. Document Released: 05/28/2015 Document Revised: 01/19/2016 Document Reviewed: 03/02/2015 Elsevier Interactive Patient Education  2017 [place] Prevention in the Home Falls can cause injuries. They can happen to people of all ages. There are many things you can do to make your home safe and to help prevent falls. What can I do on the outside of my home? Regularly fix the edges of walkways and driveways and fix any cracks. Remove anything that might make you trip as you walk through a door, such as a raised step or threshold. Trim any bushes or trees on the path to your home. Use bright outdoor lighting. Clear any walking paths of anything that might make someone trip, such as rocks or tools. Regularly check to see if handrails are loose or broken. Make sure that both sides of any steps have handrails. Any raised decks and porches should have guardrails on the edges. Have any leaves, snow, or ice cleared regularly. Use sand or salt on walking paths during winter. Clean up any spills in your garage right away. This includes oil or grease spills. What can I do in the bathroom? Use night lights. Install grab  bars by the toilet and in the tub and shower. Do not use towel bars as grab bars. Use non-skid mats or decals in the tub or shower. If you need to sit down in the shower, use a plastic, non-slip stool. Keep the floor dry. Clean up any water that spills on the floor as soon as it happens. Remove soap buildup in the tub or shower regularly. Attach bath mats securely with double-sided non-slip rug tape. Do not have throw rugs and other things on the floor that can make you trip. What can I do in the bedroom? Use night lights. Make sure that you have a light by your bed that is easy to reach. Do not use any sheets or blankets that are too big for your bed. They should not hang down onto the floor. Have a firm chair that has side arms. You can use this for support while you get dressed. Do not have throw rugs and other things on the floor that can make you trip. What can I do in the kitchen? Clean up any spills right away. Avoid walking on wet floors. Keep items that you use a lot in easy-to-reach places. If you need to reach something above you, use a strong step stool that has a grab bar. Keep electrical cords out of the way. Do not use floor polish or wax that makes floors slippery. If you must use wax, use non-skid floor wax. Do not have throw rugs and other things on the floor that can make you trip. What can I do with my stairs?  Do not leave any items on the stairs. Make sure that there are handrails on both sides of the stairs and use them. Fix handrails that are broken or loose. Make sure that handrails are as long as the stairways. Check any carpeting to make sure that it is firmly attached to the stairs. Fix any carpet that is loose or worn. Avoid having throw rugs at the top or bottom of the stairs. If you do have throw rugs, attach them to the floor with carpet tape. Make sure that you have a light switch at the top of the stairs and the bottom of the stairs. If you do not have them,  ask someone to add them for you. What else can I do to help prevent falls? Wear shoes that: Do not have high heels. Have rubber bottoms. Are comfortable and fit you well. Are closed at the toe. Do not wear sandals. If you use a stepladder: Make sure that it is fully opened. Do not climb a closed stepladder. Make sure that both sides of the stepladder are locked into place. Ask someone to hold it for you, if possible. Clearly mark and make sure that you can see: Any grab bars or handrails. First and last steps. Where the edge of each step is. Use tools that help you move around (mobility aids) if they are needed. These include: Canes. Walkers. Scooters. Crutches. Turn on the lights when you go into a dark area. Replace any light bulbs as soon as they burn out. Set up your furniture so you have a clear path. Avoid moving your furniture around. If any of your floors are uneven, fix them. If there are any pets around you, be aware of where they are. Review your medicines with your doctor. Some medicines can make you feel dizzy. This can increase your chance of falling. Ask your doctor what other things that you can do to help prevent falls. This information is not intended to replace advice given to you by your health care provider. Make sure you discuss any questions you have with your health care provider. Document Released: 02/25/2009 Document Revised: 10/07/2015 Document Reviewed: 06/05/2014 Elsevier Interactive Patient Education  2017 [employer].

## 2021-02-18 NOTE — Progress Notes (Signed)
Subjective:   Katie [name] is a 75 y.o. female who presents for Medicare Annual (Subsequent) preventive examination.  Review of Systems     Cardiac Risk Factors include: advanced age (>76men, >75 women);dyslipidemia;family history of premature cardiovascular disease;hypertension     Objective:    Today's Vitals   02/18/21 1439  BP: 132/80  Pulse: (!) 104  Temp: 98.4 F (36.9 C)  SpO2: 98%  Weight: 162 lb 9.6 oz (73.8 kg)  Height: 5\' 4"  (1.626 m)  PainSc: 0-No pain   Body mass index is 27.91 kg/m.  Advanced Directives 02/18/2021 08/11/2020 10/30/2019 04/24/2018 03/28/2018 06/21/2017 03/26/2017  Does Patient Have a Medical Advance Directive? No No Yes Yes No Yes No  Type of Advance Directive - - Living will Living will - Living will -  Does patient want to make changes to medical advance directive? - - No - Patient declined - - - -  Copy of [place] in [place]  Would patient like information on creating a medical advance directive? No - Patient declined Yes (MAU/Ambulatory/Procedural Areas - Information given) - - Yes (ED - Information included in AVS) - Yes (ED - Information included in AVS)    Current Medications (verified) Outpatient Encounter Medications as of 02/18/2021  Medication Sig   ALPRAZolam (XANAX) 0.5 MG tablet TAKE 1 TABLET BY MOUTH 3  TIMES DAILY AS NEEDED FOR  ANXIETY OR SLEEP.   famotidine (PEPCID) 20 MG tablet Take 2 tablets (40 mg total) by mouth daily.   levothyroxine (SYNTHROID, LEVOTHROID) 88 MCG tablet Take 88 mcg by mouth daily.   LINZESS 290 MCG CAPS capsule TAKE 1 CAPSULE BY MOUTH ONCE DAILY BEFORE BREAKFAST   rosuvastatin (CRESTOR) 10 MG tablet Take 10 mg by mouth daily.   ACCU-CHEK AVIVA PLUS test strip    allopurinol (ZYLOPRIM) 100 MG tablet Take 1 tablet (100 mg total) by mouth daily.   BIOTIN 5000 PO Take by mouth.   cyanocobalamin (,VITAMIN B-12,) 1000 MCG/ML injection INJECT 1 MILLILITERS  ONCE EVERY MONTH    Digestive Enzymes (DIGESTIVE ENZYME PO) Take 1 capsule by mouth daily.   Ergocalciferol (VITAMIN D2 PO) Take by mouth.   gabapentin (NEURONTIN) 100 MG capsule Take 1 capsule (100 mg total) by mouth 3 (three) times daily. (Patient taking differently: Take 100 mg by mouth 3 (three) times daily as needed.)   Lancet Devices (SIMPLE DIAGNOSTICS LANCING DEV) MISC    potassium chloride (KLOR-CON) 8 MEQ tablet Take 1 tablet (8 mEq total) by mouth 2 (two) times daily.   Propylene Glycol 0.6 % SOLN Take 1 application by mouth daily as needed.   No facility-administered encounter medications on file as of 02/18/2021.    Allergies (verified) Lisinopril, Metoprolol succinate, Penicillins, Sulfamethoxazole, Sulfamethoxazole-trimethoprim, Trimethoprim, and Metformin   History: Past Medical History:  Diagnosis Date   Acute venous embolism and thrombosis of unspecified deep vessels of lower extremity 2001   RLE DVT and PE, coumadin x 48mo, now ASSA   Anxiety state, unspecified    Chronic idiopathic constipation 11/02/2016   Degeneration of cervical intervertebral disc    Gallstones    History of DVT (deep vein thrombosis)    Iron deficiency anemia secondary to blood loss (chronic)    Irritable bowel syndrome    Lumbago    Other B-complex deficiencies    Personal history of colonic polyps    adenomatous   Pure hypercholesterolemia    Reflux esophagitis  Tubular adenoma of colon 09/2002   Type II or unspecified type diabetes mellitus without mention of complication, not stated as uncontrolled    Unspecified essential hypertension    Unspecified hypothyroidism    Unspecified iridocyclitis    Unspecified venous (peripheral) insufficiency    Unspecified vitamin D deficiency    Past Surgical History:  Procedure Laterality Date   BIOPSY  04/24/2018   Procedure: BIOPSY;  Surgeon: Irving Copas., MD;  Location: [facility];  Service: Gastroenterology;;   Wilmon Pali RELEASE     CATARACT  EXTRACTION Bilateral 08/29/2017,09/19/17   ESOPHAGOGASTRODUODENOSCOPY (EGD) WITH PROPOFOL N/A 04/24/2018   Procedure: ESOPHAGOGASTRODUODENOSCOPY (EGD) WITH PROPOFOL;  Surgeon: Irving Copas., MD;  Location: [facility];  Service: Gastroenterology;  Laterality: N/A;  with EMR   FEMORAL HERNIA REPAIR     OOPHORECTOMY     POLYPECTOMY  04/24/2018   Procedure: POLYPECTOMY;  Surgeon: Mansouraty, Telford Nab., MD;  Location: [facility];  Service: Gastroenterology;;   UPPER ESOPHAGEAL ENDOSCOPIC ULTRASOUND (EUS)  04/24/2018   Procedure: UPPER ESOPHAGEAL ENDOSCOPIC ULTRASOUND (EUS);  Surgeon: Rush Landmark Telford Nab., MD;  Location: [facility];  Service: Gastroenterology;;   VESICOVAGINAL FISTULA CLOSURE W/ TAH     Family History  Problem Relation Age of Onset   Prostate cancer Father    Diabetes Mother        retinopathy/blind   Deep vein thrombosis Mother    Hypertension Mother    Healthy Sister    Colon cancer Neg Hx    Esophageal cancer Neg Hx    Stomach cancer Neg Hx    Rectal cancer Neg Hx    Social History   Socioeconomic History   Marital status: Married    Spouse name: Charla Criscione   Number of children: 0   Years of education: masters   Highest education level: Not on file  Occupational History   Occupation: record Firefighter: [employer]  Tobacco Use   Smoking status: Never   Smokeless tobacco: Never  Vaping Use   Vaping Use: Never used  Substance and Sexual Activity   Alcohol use: Yes    Alcohol/week: 0.0 standard drinks    Comment: occasional-wine    Drug use: No   Sexual activity: Not Currently  Other Topics Concern   Not on file  Social History Narrative   Lives with husband in a 2 story home with a basement.  Has no children.     Retired from the police department.     Education: masters (guidance and counseling).    Social Determinants of Health   Financial Resource Strain: Low Risk    Difficulty of Paying Living Expenses: Not hard  at all  Food Insecurity: No Food Insecurity   Worried About Charity fundraiser in the Last Year: Never true   [place] in the Last Year: Never true  Transportation Needs: No Transportation Needs   Lack of Transportation (Medical): No   Lack of Transportation (Non-Medical): No  Physical Activity: Inactive   Days of Exercise per Week: 0 days   Minutes of Exercise per Session: 0 min  Stress: Stress Concern Present   Feeling of Stress : Rather much  Social Connections: Socially Integrated   Frequency of Communication with Friends and Family: More than three times a week   Frequency of Social Gatherings with Friends and Family: More than three times a week   Attends Religious Services: More than 4 times per year   Active  Member of Clubs or Organizations: Not on file   Attends Archivist Meetings: More than 4 times per year   Marital Status: Married    Tobacco Counseling Counseling given: Not Answered   Clinical Intake:  Pre-visit preparation completed: Yes  Pain : No/denies pain Pain Score: 0-No pain     BMI - recorded: 27.91 Nutritional Status: BMI 25 -29 Overweight Nutritional Risks: None Diabetes: No  How often do you need to have someone help you when you read instructions, pamphlets, or other written materials from your doctor or pharmacy?: 1 - Never What is the last grade level you completed in school?: Master's Degree  Diabetic? no  Interpreter Needed?: No  Information entered by :: Lisette Abu, LPN   Activities of Daily Living In your present state of health, do you have any difficulty performing the following activities: 02/18/2021 12/13/2020  Hearing? N N  Vision? N N  Difficulty concentrating or making decisions? N N  Walking or climbing stairs? N N  Dressing or bathing? N N  Doing errands, shopping? N N  Preparing Food and eating ? N -  Using the Toilet? N -  In the past six months, have you accidently leaked urine? N -  Do you  have problems with loss of bowel control? N -  Managing your Medications? N -  Managing your Finances? N -  Housekeeping or managing your Housekeeping? N -  Some recent data might be hidden    Patient Care Team: Janith Lima, MD as PCP - General (Internal Medicine) Altheimer, Legrand Como, MD (Endocrinology) Ladene Artist, MD (Gastroenterology) Rosemary Holms, DPM (Podiatry) Rutherford Guys, MD (Ophthalmology) Carren Rang, Nadara Mustard, MD (Gynecology) Lavonna Monarch, MD as Consulting Physician (Dermatology)  Indicate any recent Medical Services you may have received from other than [facility] providers in the past year (date may be approximate).     Assessment:   This is a routine wellness examination for [place].  Hearing/Vision screen Hearing Screening - Comments:: Patient denied any hearing difficulty.   No hearing aids.  Vision Screening - Comments:: Patient wears corrective glasses/contacts.  Eye exam done annually by: Rutherford Guys, MD.  Dietary issues and exercise activities discussed: Current Exercise Habits: The patient does not participate in regular exercise at present, Exercise limited by: None identified   Goals Addressed             This Visit's Progress    Patient Stated       My goal is to rejoin the gym to increase my physical activity.      Depression Screen PHQ 2/9 Scores 02/18/2021 10/30/2019 12/30/2018 03/28/2018 03/26/2017 03/14/2016 02/16/2016  PHQ - 2 Score 0 0 0 0 1 0 0  PHQ- 9 Score - - - 2 2 - -    Fall Risk Fall Risk  02/18/2021 12/13/2020 10/30/2019 12/30/2018 03/28/2018  Falls in the past year? 0 0 0 0 0  Number falls in past yr: 0 - 0 0 -  Injury with Fall? 0 - 0 0 -  Risk for fall due to : No Fall Risks - Orthopedic patient - -  Follow up Falls evaluation completed - Falls evaluation completed Falls evaluation completed -    FALL RISK PREVENTION PERTAINING TO THE HOME:  Any stairs in or around the home? Yes  If so, are there any without handrails? No   Home free of loose throw rugs in walkways, pet beds, electrical cords, etc? Yes  Adequate lighting in your home to  reduce risk of falls? Yes   ASSISTIVE DEVICES UTILIZED TO PREVENT FALLS:  Life alert? No  Use of a cane, walker or w/c? No  Grab bars in the bathroom? Yes  Shower chair or bench in shower? No  Elevated toilet seat or a handicapped toilet? Yes   TIMED UP AND GO:  Was the test performed? Yes .  Length of time to ambulate 10 feet: 6 sec.   Gait steady and fast without use of assistive device  Cognitive Function: Normal cognitive status assessed by direct observation by this Nurse Health Advisor. No abnormalities found.   MMSE - Mini Mental State Exam 03/26/2017 02/16/2016  Not completed: - (No Data)  Orientation to time 5 -  Orientation to Place 5 -  Registration 3 -  Attention/ Calculation 4 -  Recall 2 -  Language- name 2 objects 2 -  Language- repeat 1 -  Language- follow 3 step command 3 -  Language- read & follow direction 1 -  Write a sentence 1 -  Copy design 1 -  Total score 28 -     6CIT Screen 10/30/2019  What Year? 0 points  What month? 0 points  What time? 0 points  Count back from 20 0 points  Months in reverse 0 points  Repeat phrase 0 points  Total Score 0    Immunizations Immunization History  Administered Date(s) Administered   Fluad Quad(high Dose 65+) 01/25/2021   Influenza Split 02/28/2012, 02/15/2013, 01/27/2014   Influenza Whole 02/13/2008, 04/07/2009   Influenza, High Dose Seasonal PF 01/29/2018, 02/02/2019   Influenza-Unspecified 02/04/2016, 03/01/2017, 01/28/2020   PFIZER(Purple Top)SARS-COV-2 Vaccination 06/21/2019, 07/16/2019, 03/27/2020   Pneumococcal Conjugate-13 08/11/2014   Pneumococcal Polysaccharide-23 05/31/2011, 06/18/2017   Tdap 03/16/2015   Zoster Recombinat (Shingrix) 01/26/2017, 05/09/2017    TDAP status: Up to date  Flu Vaccine status: Up to date  Pneumococcal vaccine status: Up to date  Covid-19  vaccine status: Completed vaccines  Qualifies for Shingles Vaccine? Yes   Zostavax completed Yes   Shingrix Completed?: Yes  Screening Tests Health Maintenance  Topic Date Due   OPHTHALMOLOGY EXAM  12/03/2019   URINE MICROALBUMIN  12/30/2019   FOOT EXAM  04/30/2020   COVID-19 Vaccine (4 - Booster for Pfizer series) 06/19/2020   HEMOGLOBIN A1C  06/15/2021   TETANUS/TDAP  03/15/2025   COLONOSCOPY (Pts 45-77yrs Insurance coverage will need to be confirmed)  01/29/2026   INFLUENZA VACCINE  Completed   DEXA SCAN  Completed   Hepatitis C Screening  Completed   Zoster Vaccines- Shingrix  Completed   HPV VACCINES  Aged Out    Health Maintenance  Health Maintenance Due  Topic Date Due   OPHTHALMOLOGY EXAM  12/03/2019   URINE MICROALBUMIN  12/30/2019   FOOT EXAM  04/30/2020   COVID-19 Vaccine (4 - Booster for [place] series) 06/19/2020    Colorectal cancer screening: Type of screening: Colonoscopy. Completed 01/30/2019. Repeat every 7 years  Mammogram status: Completed 05/26/2020. Repeat every year  Lung Cancer Screening: (Low Dose CT Chest recommended if Age 60-80 years, 30 pack-year currently smoking OR have quit w/in 15years.) does not qualify.   Lung Cancer Screening Referral: no  Additional Screening:  Hepatitis C Screening: does qualify; Completed yes  Vision Screening: Recommended annual ophthalmology exams for early detection of glaucoma and other disorders of the eye. Is the patient up to date with their annual eye exam?  Yes  Who is the provider or what is the name of the office  in which the patient attends annual eye exams? Rutherford Guys, MD. If pt is not established with a provider, would they like to be referred to a provider to establish care? No .   Dental Screening: Recommended annual dental exams for proper oral hygiene  Community Resource Referral / Chronic Care Management: CRR required this visit?  No   CCM required this visit?  No      Plan:     I  have personally reviewed and noted the following in the patient's chart:   Medical and social history Use of alcohol, tobacco or illicit drugs  Current medications and supplements including opioid prescriptions.  Functional ability and status Nutritional status Physical activity Advanced directives List of other physicians Hospitalizations, surgeries, and ER visits in previous 12 months Vitals Screenings to include cognitive, depression, and falls Referrals and appointments  In addition, I have reviewed and discussed with patient certain preventive protocols, quality metrics, and best practice recommendations. A written personalized care plan for preventive services as well as general preventive health recommendations were provided to patient.     Sheral Flow, LPN   [id number]   Nurse Notes:  Hearing Screening - Comments:: Patient denied any hearing difficulty.   No hearing aids.  Vision Screening - Comments:: Patient wears corrective glasses/contacts.  Eye exam done annually by: Rutherford Guys, MD.

## 2021-02-23 ENCOUNTER — Other Ambulatory Visit (HOSPITAL_COMMUNITY): Payer: Self-pay | Admitting: *Deleted

## 2021-02-24 ENCOUNTER — Encounter (HOSPITAL_COMMUNITY)
Admission: RE | Admit: 2021-02-24 | Discharge: 2021-02-24 | Disposition: A | Discharge status: Home / Self Care | Payer: Medicare Other | Source: Ambulatory Visit | Attending: Nephrology | Admitting: Nephrology

## 2021-02-24 DIAGNOSIS — D631 Anemia in chronic kidney disease: Secondary | ICD-10-CM | POA: Insufficient documentation

## 2021-02-24 DIAGNOSIS — N189 Chronic kidney disease, unspecified: Secondary | ICD-10-CM | POA: Insufficient documentation

## 2021-02-24 MED ORDER — FERUMOXYTOL INJECTION 510 MG/17 ML
510.0000 mg | INTRAVENOUS | Status: DC
  Administered 2021-02-24: 510 mg via INTRAVENOUS
  Filled 2021-02-24: qty 510

## 2021-03-01 ENCOUNTER — Other Ambulatory Visit: Payer: Self-pay | Admitting: Internal Medicine

## 2021-03-01 DIAGNOSIS — K2100 Gastro-esophageal reflux disease with esophagitis, without bleeding: Secondary | ICD-10-CM

## 2021-03-01 DIAGNOSIS — E1122 Type 2 diabetes mellitus with diabetic chronic kidney disease: Secondary | ICD-10-CM | POA: Insufficient documentation

## 2021-03-03 ENCOUNTER — Encounter (HOSPITAL_COMMUNITY): Payer: Medicare Other

## 2021-03-04 ENCOUNTER — Other Ambulatory Visit: Payer: Self-pay

## 2021-03-04 ENCOUNTER — Encounter (HOSPITAL_COMMUNITY)
Admission: RE | Admit: 2021-03-04 | Discharge: 2021-03-04 | Disposition: A | Discharge status: Home / Self Care | Payer: Medicare Other | Source: Ambulatory Visit | Attending: Nephrology | Admitting: Nephrology

## 2021-03-04 DIAGNOSIS — N189 Chronic kidney disease, unspecified: Secondary | ICD-10-CM | POA: Diagnosis not present

## 2021-03-04 MED ORDER — FERUMOXYTOL INJECTION 510 MG/17 ML
510.0000 mg | INTRAVENOUS | Status: DC
  Administered 2021-03-04: 510 mg via INTRAVENOUS
  Filled 2021-03-04: qty 510

## 2021-03-05 ENCOUNTER — Other Ambulatory Visit: Payer: Self-pay | Admitting: Internal Medicine

## 2021-03-05 DIAGNOSIS — F411 Generalized anxiety disorder: Secondary | ICD-10-CM

## 2021-03-31 ENCOUNTER — Other Ambulatory Visit: Payer: Self-pay | Admitting: Internal Medicine

## 2021-03-31 DIAGNOSIS — E538 Deficiency of other specified B group vitamins: Secondary | ICD-10-CM

## 2021-04-18 ENCOUNTER — Other Ambulatory Visit: Payer: Self-pay

## 2021-04-18 ENCOUNTER — Ambulatory Visit: Payer: Medicare Other | Attending: Internal Medicine

## 2021-04-18 ENCOUNTER — Other Ambulatory Visit (HOSPITAL_BASED_OUTPATIENT_CLINIC_OR_DEPARTMENT_OTHER): Payer: Self-pay

## 2021-04-18 DIAGNOSIS — Z23 Encounter for immunization: Secondary | ICD-10-CM

## 2021-04-18 MED ORDER — PFIZER COVID-19 VAC BIVALENT 30 MCG/0.3ML IM SUSP
INTRAMUSCULAR | 0 refills | Status: DC
  Filled 2021-04-18: qty 0.3, 1d supply, fill #0

## 2021-04-18 NOTE — Progress Notes (Signed)
   Covid-19 Vaccination Clinic  Name:  Katie [name]    MRN: [id number] DOB: 1946/02/05  04/18/2021  [name] was observed post Covid-19 immunization for 15 minutes without incident. She was provided with Vaccine Information Sheet and instruction to access the V-Safe system.   [name] was instructed to call 911 with any severe reactions post vaccine: Difficulty breathing  Swelling of face and throat  A fast heartbeat  A bad rash all over body  Dizziness and weakness   Immunizations Administered     Name Date Dose VIS Date Route   Pfizer Covid-19 Vaccine Bivalent Booster 04/18/2021 11:15 AM 0.3 mL 01/12/2021 Intramuscular   Manufacturer: [place]   Lot: [id number]   [place]: [phone]

## 2021-05-20 ENCOUNTER — Encounter: Payer: Self-pay | Admitting: Gastroenterology

## 2021-06-06 LAB — HM MAMMOGRAPHY

## 2021-06-13 ENCOUNTER — Telehealth: Payer: Self-pay | Admitting: Internal Medicine

## 2021-06-13 NOTE — Telephone Encounter (Signed)
Pt states she has pain and swelling in her R. Foot, pt states she is currently taking allopurinol (ZYLOPRIM) 100 MG tablet but it's not helping w/ the swelling  Pt requesting a call back to discuss options on how to reduce the swelling  Offered pt an appt w/ another [facility] provider, pt declined

## 2021-06-13 NOTE — Telephone Encounter (Signed)
Called pt, LVM stating that an OV would be needed to re-evaluate.

## 2021-06-15 ENCOUNTER — Other Ambulatory Visit: Payer: Medicare Other

## 2021-06-17 ENCOUNTER — Emergency Department (HOSPITAL_COMMUNITY): Payer: Medicare Other

## 2021-06-17 ENCOUNTER — Encounter (HOSPITAL_COMMUNITY): Payer: Self-pay | Admitting: Emergency Medicine

## 2021-06-17 ENCOUNTER — Emergency Department (HOSPITAL_BASED_OUTPATIENT_CLINIC_OR_DEPARTMENT_OTHER): Payer: Medicare Other

## 2021-06-17 ENCOUNTER — Emergency Department (HOSPITAL_COMMUNITY)
Admission: EM | Admit: 2021-06-17 | Discharge: 2021-06-17 | Disposition: A | Discharge status: Home / Self Care | Payer: Medicare Other | Attending: Student | Admitting: Student

## 2021-06-17 DIAGNOSIS — R609 Edema, unspecified: Secondary | ICD-10-CM

## 2021-06-17 DIAGNOSIS — M79604 Pain in right leg: Secondary | ICD-10-CM

## 2021-06-17 DIAGNOSIS — E119 Type 2 diabetes mellitus without complications: Secondary | ICD-10-CM | POA: Insufficient documentation

## 2021-06-17 DIAGNOSIS — I10 Essential (primary) hypertension: Secondary | ICD-10-CM | POA: Insufficient documentation

## 2021-06-17 DIAGNOSIS — M79671 Pain in right foot: Secondary | ICD-10-CM | POA: Diagnosis present

## 2021-06-17 MED ORDER — ACETAMINOPHEN 160 MG/5ML PO SOLN
650.0000 mg | Freq: Once | ORAL | Status: AC
  Administered 2021-06-17: 650 mg via ORAL
  Filled 2021-06-17: qty 20.3

## 2021-06-17 MED ORDER — PREDNISOLONE 15 MG/5ML PO SOLN: 40.0000 mg | Freq: Every day | ORAL | 0 refills | Status: AC

## 2021-06-17 NOTE — ED Notes (Signed)
Patient transported to Ultrasound 

## 2021-06-17 NOTE — Progress Notes (Signed)
Right lower extremity venous duplex completed. Refer to "CV Proc" under chart review to view preliminary results.  06/17/2021 1:45 PM Kelby Aline., MHA, RVT, RDCS, RDMS

## 2021-06-17 NOTE — ED Provider Triage Note (Signed)
Emergency Medicine Provider Triage Evaluation Note  Katie [name] , a 76 y.o. female  was evaluated in triage.  Pt complains of right foot pain and swelling for the last 2 weeks.  Patient also having pain that extends up in the right leg.  She does have history of DVT.  Not in any anticoagulation.  No chest pain or shortness of breath.  Review of Systems  Positive:  Negative: Katie above   Physical Exam  BP (!) 175/88 (BP Location: Left Arm)    Pulse (!) 102    Temp 98.2 F (36.8 C) (Oral)    Resp 16    SpO2 99%  Gen:   Awake, no distress   Resp:  Normal effort  MSK:   Moves extremities without difficulty  Other:  No calf tenderness.  Right foot is mildly swollen  Medical Decision Making  Medically screening exam initiated at 11:44 AM.  Appropriate orders placed.  Katie [name] was informed that the remainder of the evaluation will be completed by another provider, this initial triage assessment does not replace that evaluation, and the importance of remaining in the ED until their evaluation is complete.     Myna Bright [place], [place] 06/17/21 1145

## 2021-06-17 NOTE — Discharge Instructions (Signed)
Your x-ray shows no evidence of fracture and we do not see any blood clot.  Pain is likely due to a gout flare.  Continue taking Tylenol 650 mg every 6 hours, you can also apply ice if this helps with pain.  Take prednisolone 40 mg daily for the next 5 days.  Please call today to schedule close follow-up with your primary care doctor or podiatrist for Monday or Tuesday for recheck to ensure this is improving.  If you develop fevers, worsening swelling of your foot or spreading swelling and redness up your leg please return to the ED for further evaluation.

## 2021-06-17 NOTE — ED Provider Notes (Signed)
[facility] EMERGENCY DEPARTMENT Provider Note   CSN: [id number] Arrival date & time: 06/17/21  1131     History  Chief Complaint  Patient presents with   Foot Pain    Katie [name] is a 76 y.o. female.  Katie [name] is a 76 y.o. female with a history of DVT, hypertension, diabetes, gout, who presents to the emergency department for evaluation of right foot pain and swelling.  Patient reports symptoms have been present for 2 weeks.  She denies any associated injury or trauma to the foot.  Reports pain over the top of the foot that has been constant associated with very mild swelling.  Pain and swelling has not spread from the foot and has not started to extend up the leg.  Patient reports that she has been wearing a postop shoe for the other foot that she had from a previous orthopedic procedure because all of her other shoes are too painful to wear.  She is not currently on any blood thinners, and is worried this could be another blood clot, denies any associated chest pain or shortness of breath.  She has not had any fevers or chills.  The history is provided by the patient.  Foot Pain Pertinent negatives include no chest pain and no shortness of breath.      Home Medications Prior to Admission medications   Medication Sig Start Date End Date Taking? Authorizing Provider  ACCU-CHEK AVIVA PLUS test strip  07/14/13   [provider]  allopurinol (ZYLOPRIM) 100 MG tablet Take 1 tablet (100 mg total) by mouth daily. 12/03/20   Biagio Borg, MD  ALPRAZolam Duanne Moron) 0.5 MG tablet TAKE 1 TABLET BY MOUTH 3  TIMES DAILY AS NEEDED FOR  ANXIETY OR SLEEP 03/06/21   Janith Lima, MD  BIOTIN 5000 PO Take by mouth.    [provider]  COVID-19 mRNA bivalent vaccine, Pfizer, (PFIZER COVID-19 VAC BIVALENT) injection Inject into the muscle. 04/18/21   Carlyle Basques, MD  cyanocobalamin (,VITAMIN B-12,) 1000 MCG/ML injection INJECT 1 ML  ONCE EVERY MONTH 03/31/21    Janith Lima, MD  Digestive Enzymes (DIGESTIVE ENZYME PO) Take 1 capsule by mouth daily.    [provider]  Ergocalciferol (VITAMIN D2 PO) Take by mouth.    [provider]  famotidine (PEPCID) 20 MG tablet Take 2 tablets by mouth once daily 03/01/21   Janith Lima, MD  gabapentin (NEURONTIN) 100 MG capsule Take 1 capsule (100 mg total) by mouth 3 (three) times daily. Patient taking differently: Take 100 mg by mouth 3 (three) times daily as needed. 05/01/19   Janith Lima, MD  Lancet Devices (SIMPLE DIAGNOSTICS LANCING DEV) [place]  01/13/15   [provider]  levothyroxine (SYNTHROID, LEVOTHROID) 88 MCG tablet Take 88 mcg by mouth daily.    [provider]  LINZESS 290 MCG CAPS capsule TAKE 1 CAPSULE BY MOUTH ONCE DAILY BEFORE BREAKFAST 01/07/20   Ladene Artist, MD  potassium chloride (KLOR-CON) 8 MEQ tablet Take 1 tablet (8 mEq total) by mouth 2 (two) times daily. 12/16/20   Janith Lima, MD  Propylene Glycol 0.6 % SOLN Take 1 application by mouth daily as needed.    [provider]  rosuvastatin (CRESTOR) 10 MG tablet Take 10 mg by mouth daily. 03/17/18   [provider]      Allergies    Lisinopril, Metoprolol succinate, Penicillins, Sulfamethoxazole, Sulfamethoxazole-trimethoprim, Trimethoprim, and Metformin    Review  of Systems   Review of Systems  Constitutional:  Negative for chills and fever.  Respiratory:  Negative for shortness of breath.   Cardiovascular:  Negative for chest pain.  Musculoskeletal:  Positive for arthralgias and joint swelling.  Skin:  Negative for color change.  Neurological:  Negative for weakness and numbness.  All other systems reviewed and are negative.  Physical Exam Updated Vital Signs BP (!) 175/88 (BP Location: Left Arm)    Pulse (!) 102    Temp 98.2 F (36.8 C) (Oral)    Resp 16    SpO2 99%  Physical Exam Vitals and nursing note reviewed.  Constitutional:      General: She is not in  acute distress.    Appearance: Normal appearance. She is well-developed. She is not ill-appearing or diaphoretic.  HENT:     Head: Normocephalic and atraumatic.  Eyes:     General:        Right eye: No discharge.        Left eye: No discharge.  Pulmonary:     Effort: Pulmonary effort is normal. No respiratory distress.  Musculoskeletal:        General: Swelling and tenderness present.     Comments: Mild swelling to the top of the right foot with associated tenderness, no overlying erythema but there is some slight darkening of the skin, not hot to the touch.  Distal pulses 2+ and confirmed with Doppler with normal flow bilaterally.  Patient is able to move foot with normal range of motion and has normal sensation.  No tenderness or swelling present in the ankle or calf.  Skin:    General: Skin is warm and dry.  Neurological:     Mental Status: She is alert and oriented to person, place, and time.     Coordination: Coordination normal.  Psychiatric:        Mood and Affect: Mood normal.        Behavior: Behavior normal.    ED Results / Procedures / Treatments   Labs (all labs ordered are listed, but only abnormal results are displayed) Labs Reviewed - No data to display  EKG None  Radiology DG Foot Complete Right  Result Date: 06/17/2021 CLINICAL DATA:  Right foot pain EXAM: RIGHT FOOT COMPLETE - 3+ VIEW COMPARISON:  None. FINDINGS: No acute fracture or dislocation identified. Degenerative changes in the midfoot with mild joint space narrowing and dorsal osteophytes. Mild degenerative changes at the first metatarsophalangeal joint. Prominent plantar calcaneal spur. Mild soft tissue swelling in the forefoot. IMPRESSION: Degenerative changes with no acute osseous abnormality identified. Mild soft tissue swelling. Electronically Signed   By: Ofilia Neas M.D.   On: 06/17/2021 12:18    Procedures Procedures    Medications Ordered in ED Medications - No data to display  ED  Course/ Medical Decision Making/ A&P                           Medical Decision Making Problems Addressed: Foot pain, right: undiagnosed new problem with uncertain prognosis  Amount and/or Complexity of Data Reviewed External Data Reviewed: radiology and notes. Radiology: ordered and independent interpretation performed.  Risk OTC drugs. Prescription drug management.   76 year old female presents with 2 weeks of right foot pain and swelling, the foot is neurovascularly intact on exam, there is mild swelling, no overlying erythema, given that this has been present for 2 weeks and has not extended overall I have  low suspicion for infection.   Differential includes: Gout, infection, DVT, fracture, arthritis, soft tissue injury, septic arthritis   X-ray of the right foot obtained, reviewed and independently interpreted by myself with no evidence of fracture or bony abnormality, slight soft tissue swelling.  DVT study completed and also reviewed and interpreted by myself without evidence of DVT.  Agree with radiologist findings.  Considered lab work, but given patient's well appearance with no fevers and reassuring imaging do not feel that it would change management today.  Given reassuring evaluation today suspect patient may have ongoing gout flare, she has a history of gout in the past.  Patient provided with an appropriate postop shoe given pain when wearing her shoes at home, and will treat with course of steroids and Tylenol for gout and have patient follow-up closely with her primary care doctor for recheck.  Return precautions provided at this time feel patient is appropriate for discharge home with continued outpatient treatment.        Final Clinical Impression(s) / ED Diagnoses Final diagnoses:  Foot pain, right    Rx / DC Orders ED Discharge Orders          Ordered    prednisoLONE (PRELONE) 15 MG/5ML SOLN  Daily        06/17/21 1436              Janet Berlin 06/21/21 2005    Teressa Lower, MD 06/24/21 [phone]

## 2021-06-17 NOTE — ED Triage Notes (Signed)
Patient complains of foot pain that started two weeks ago. Patient denies known injury, arrives wearing orthopedic shoe from her orthopedists from a previous issue. Patient alert, oriented, and in no apparent distress at this time.

## 2021-06-20 ENCOUNTER — Other Ambulatory Visit: Payer: Self-pay | Admitting: Internal Medicine

## 2021-06-20 DIAGNOSIS — K2100 Gastro-esophageal reflux disease with esophagitis, without bleeding: Secondary | ICD-10-CM

## 2021-06-27 ENCOUNTER — Telehealth: Payer: Self-pay | Admitting: *Deleted

## 2021-06-27 MED ORDER — ALLOPURINOL 100 MG PO TABS: 100.0000 mg | ORAL_TABLET | Freq: Every day | ORAL | 2 refills | Status: DC

## 2021-06-27 NOTE — Telephone Encounter (Signed)
Patient prescription for Allopurinol was sent to [facility] mail order

## 2021-07-04 ENCOUNTER — Ambulatory Visit: Payer: Medicare Other | Admitting: Cardiology

## 2021-07-13 ENCOUNTER — Encounter: Payer: Self-pay | Admitting: Cardiology

## 2021-07-13 ENCOUNTER — Other Ambulatory Visit: Payer: Self-pay

## 2021-07-13 ENCOUNTER — Ambulatory Visit: Payer: Medicare Other | Admitting: Cardiology

## 2021-07-13 VITALS — BP 133/85 | HR 91 | Temp 97.6°F | Resp 16 | Ht 64.0 in | Wt 159.4 lb

## 2021-07-13 DIAGNOSIS — I10 Essential (primary) hypertension: Secondary | ICD-10-CM

## 2021-07-13 DIAGNOSIS — R7303 Prediabetes: Secondary | ICD-10-CM

## 2021-07-13 DIAGNOSIS — E785 Hyperlipidemia, unspecified: Secondary | ICD-10-CM

## 2021-07-13 DIAGNOSIS — I361 Nonrheumatic tricuspid (valve) insufficiency: Secondary | ICD-10-CM

## 2021-07-13 NOTE — Progress Notes (Signed)
IDLacy [name], DOB Oct 15, 1945, MRN [id number]  PCP:  Katie Lima, MD  Cardiologist:  Katie Kras, DO, [facility] (established care 06/03/2020)  Date: 07/13/21 Last Office Visit: July 02 2020  Chief Complaint  Patient presents with   Results   Follow-up    HPI  Katie [name] is a 76 y.o. female who presents to the office with a chief complaint of "1 year follow-up." Patient's past medical history and cardiovascular risk factors include: History of DVT, GERD, hypertension chronic kidney disease stage IIIb, acquired hypothyroidism, hyperlipidemia, prediabetes, advanced age, postmenopausal female.  Initially referred to the practice for evaluation of abnormal EKG.  She underwent an echocardiogram and stress test.  Echocardiogram noted preserved LVEF, grade 1 diastolic impairment, and moderate tricuspid regurgitation.  Exercise stress test noted normal myocardial perfusion without obvious evidence of reversible myocardial ischemia or scar overall there was a low risk study.  Patient presents today for a 1 year follow-up visit given her age and risk factors.  She is supposed to have an echocardiogram to reevaluate the progression of tricuspid regurgitation but currently this is still pending.  Clinically she is doing well from a cardiovascular standpoint.  She denies angina pectoris or heart failure symptoms.  Patient states that she just dizzy/overwhelmed taking care of her husband and therefore has not participated in any structured exercise program or daily routine.  FUNCTIONAL STATUS: Prior to the pandemic she would regularly go to gym. No structured exercise program or daily routine.   ALLERGIES: Allergies  Allergen Reactions   Lisinopril Other (See Comments) and Cough    Cough and throat clearing   Metoprolol Succinate Other (See Comments)    Unknown reaction   Penicillins Swelling    Has patient had a PCN reaction causing immediate rash, facial/tongue/throat swelling, SOB or  lightheadedness with hypotension: Yes Has patient had a PCN reaction causing severe rash involving mucus membranes or skin necrosis: No Has patient had a PCN reaction that required hospitalization: No Has patient had a PCN reaction occurring within the last 10 years: No If all of the above answers are "NO", then may proceed with Cephalosporin use.    Sulfamethoxazole Other (See Comments)    Unknown reaction   Sulfamethoxazole-Trimethoprim Other (See Comments)    Unknown reaction   Trimethoprim Other (See Comments)    Unknown reaction   Metformin Nausea Only    MEDICATION LIST PRIOR TO VISIT: Current Meds  Medication Sig   ACCU-CHEK AVIVA PLUS test strip    allopurinol (ZYLOPRIM) 100 MG tablet Take 1 tablet (100 mg total) by mouth daily.   ALPRAZolam (XANAX) 0.5 MG tablet TAKE 1 TABLET BY MOUTH 3  TIMES DAILY AS NEEDED FOR  ANXIETY OR SLEEP (Patient taking differently: Take 0.5 mg by mouth 2 (two) times daily as needed for anxiety.)   aspirin 81 MG chewable tablet Chew 81 mg by mouth every morning.   cyanocobalamin (,VITAMIN B-12,) 1000 MCG/ML injection INJECT 1 ML  ONCE EVERY MONTH (Patient taking differently: Inject 1,000 mcg into the muscle every 30 (thirty) days.)   Digestive Enzymes (DIGESTIVE ENZYME PO) Take 2 capsules by mouth every morning.   Dulaglutide (TRULICITY) 1.5 WU/9.8JX SOPN Inject 1.5 mg into the skin See admin instructions. Inject 0.5 mls (1.5 mg) subcutaneously every other Sunday   famotidine (PEPCID) 20 MG tablet Take 2 tablets by mouth once daily   Lancet Devices (SIMPLE DIAGNOSTICS LANCING DEV) MISC    levothyroxine (SYNTHROID, LEVOTHROID) 88 MCG tablet Take 88 mcg  by mouth daily before breakfast.   linaclotide (LINZESS) 290 MCG CAPS capsule Take 290 mcg by mouth as needed.   magnesium hydroxide (DULCOLAX) 400 MG/5ML suspension Take 15 mLs by mouth daily as needed for mild constipation.   Polyethyl Glycol-Propyl Glycol (SYSTANE) 0.4-0.3 % SOLN Place 1 drop into  both eyes daily as needed (dry eyes).   rosuvastatin (CRESTOR) 10 MG tablet Take 10 mg by mouth every morning.   vitamin E 180 MG (400 UNITS) capsule Take 400 Units by mouth every morning.     PAST MEDICAL HISTORY: Past Medical History:  Diagnosis Date   Acute venous embolism and thrombosis of unspecified deep vessels of lower extremity 2001   RLE DVT and PE, coumadin x 10mo, now ASSA   Anxiety state, unspecified    Chronic idiopathic constipation 11/02/2016   Degeneration of cervical intervertebral disc    Gallstones    History of DVT (deep vein thrombosis)    Iron deficiency anemia secondary to blood loss (chronic)    Irritable bowel syndrome    Lumbago    Other B-complex deficiencies    Personal history of colonic polyps    adenomatous   Pure hypercholesterolemia    Reflux esophagitis    Tubular adenoma of colon 09/2002   Type II or unspecified type diabetes mellitus without mention of complication, not stated as uncontrolled    Unspecified essential hypertension    Unspecified hypothyroidism    Unspecified iridocyclitis    Unspecified venous (peripheral) insufficiency    Unspecified vitamin D deficiency     PAST SURGICAL HISTORY: Past Surgical History:  Procedure Laterality Date   BIOPSY  04/24/2018   Procedure: BIOPSY;  Surgeon: Irving Copas., MD;  Location: [facility];  Service: Gastroenterology;;   Wilmon Pali RELEASE     CATARACT EXTRACTION Bilateral 08/29/2017,09/19/17   ESOPHAGOGASTRODUODENOSCOPY (EGD) WITH PROPOFOL N/A 04/24/2018   Procedure: ESOPHAGOGASTRODUODENOSCOPY (EGD) WITH PROPOFOL;  Surgeon: Irving Copas., MD;  Location: [facility];  Service: Gastroenterology;  Laterality: N/A;  with EMR   FEMORAL HERNIA REPAIR     OOPHORECTOMY     POLYPECTOMY  04/24/2018   Procedure: POLYPECTOMY;  Surgeon: Mansouraty, Telford Nab., MD;  Location: [facility];  Service: Gastroenterology;;   UPPER ESOPHAGEAL ENDOSCOPIC ULTRASOUND (EUS)  04/24/2018    Procedure: UPPER ESOPHAGEAL ENDOSCOPIC ULTRASOUND (EUS);  Surgeon: Rush Landmark Telford Nab., MD;  Location: [facility];  Service: Gastroenterology;;   VESICOVAGINAL FISTULA CLOSURE W/ TAH      FAMILY HISTORY: The patient family history includes Deep vein thrombosis in her mother; Diabetes in her mother; Healthy in her sister; Hypertension in her mother; Prostate cancer in her father.  SOCIAL HISTORY:  The patient  reports that she has never smoked. She has never used smokeless tobacco. She reports current alcohol use. She reports that she does not use drugs.  REVIEW OF SYSTEMS: Review of Systems  Cardiovascular:  Negative for chest pain, dyspnea on exertion, leg swelling, orthopnea, palpitations, paroxysmal nocturnal dyspnea and syncope.  Respiratory:  Negative for shortness of breath.    PHYSICAL EXAM: Vitals with BMI 07/13/2021 06/17/2021 03/04/2021  Height 5\' 4"  - -  Weight 159 lbs 6 oz - -  BMI 70.35 - -  Systolic 009 381 829  Diastolic 85 88 75  Pulse 91 102 81    CONSTITUTIONAL: Well-developed and well-nourished. No acute distress.  SKIN: Skin is warm and dry. No rash noted. No cyanosis. No pallor. No jaundice HEAD: Normocephalic and atraumatic.  EYES: No scleral icterus.  Arcus senilis. MOUTH/THROAT: Moist oral membranes.  NECK: No JVD present. No thyromegaly noted. No carotid bruits  LYMPHATIC: No visible cervical adenopathy.  CHEST Normal respiratory effort. No intercostal retractions  LUNGS: Clear to auscultation bilaterally. No stridor. No wheezes. No rales.  CARDIOVASCULAR: Regular rate and rhythm, positive B4-W9, soft holosystolic murmur left sternal border, no rubs or gallops appreciated. ABDOMINAL: Obese, soft, nontender, nondistended, positive bowel sounds all 4 quadrants. No apparent ascites.  EXTREMITIES: No peripheral edema  HEMATOLOGIC: No significant bruising NEUROLOGIC: Oriented to person, place, and time. Nonfocal. Normal muscle tone.  PSYCHIATRIC: Normal  mood and affect. Normal behavior. Cooperative  CARDIAC DATABASE: EKG: 07/13/2021: NSR, 97 bpm, LAE, LVH per voltage criteria, nonspecific T wave abnormality.   Echocardiogram: 06/09/2020: Left ventricle cavity is normal in size and wall thickness. Normal global wall motion. Normal LV systolic function with EF 59%. Doppler evidence of grade I (impaired) diastolic dysfunction, normal LAP. Moderate tricuspid regurgitation. No evidence of pulmonary hypertension.   Stress Testing: Exercise tetrofosmin stress test  06/23/2020: Normal ECG stress. The patient exercised for 3 minutes and 33 seconds of a Bruce protocol, achieving 5.28 METs.   No chest pian, fatigue led to termination of stress. Normal BP response.   Myocardial perfusion reveals mild breast attenuation noted in the inferoseptal region. No ischemia or scar. Overall LV systolic function is abnormal without regional wall motion abnormalities. Stress LV EF: 42%.  Visually the LVEF appears normal. No previous exam available for comparison. Low risk.  Heart Catheterization: None  LABORATORY DATA: CBC Latest Ref Rng & Units 12/13/2020 09/17/2020 08/11/2020  WBC 4.0 - 10.5 K/uL 4.6 4.5 4.1  Hemoglobin 12.0 - 15.0 g/dL 9.2(L) 10.3(L) 11.0(L)  Hematocrit 36.0 - 46.0 % 28.0(L) 30.6(L) 33.1(L)  Platelets 150.0 - 400.0 K/uL 234.0 177 194    CMP Latest Ref Rng & Units 12/13/2020 09/17/2020 08/11/2020  Glucose 70 - 99 mg/dL 112(H) 111(H) 122(H)  BUN 6 - 23 mg/dL 15 19 29(H)  Creatinine 0.40 - 1.20 mg/dL 1.60(H) 1.51(H) 2.09(H)  Sodium 135 - 145 mEq/L 139 143 141  Potassium 3.5 - 5.1 mEq/L 3.2(L) 3.8 3.5  Chloride 96 - 112 mEq/L 101 104 103  CO2 19 - 32 mEq/L 30 29 27   Calcium 8.4 - 10.5 mg/dL 9.3 9.4 9.2  Total Protein 6.5 - 8.1 g/dL - 6.8 7.5  Total Bilirubin 0.3 - 1.2 mg/dL - 1.0 1.1  Alkaline Phos 38 - 126 U/L - 41 51  AST 15 - 41 U/L - 21 24  ALT 0 - 44 U/L - 10 13    Lipid Panel  Lab Results  Component Value Date   CHOL 128  05/24/2020   HDL 65.00 05/24/2020   LDLCALC 43 05/24/2020   LDLDIRECT 105.9 07/04/2007   TRIG 100.0 05/24/2020   CHOLHDL 2 05/24/2020    No components found for: NTPROBNP No results for input(s): PROBNP in the last 8760 hours. Recent Labs    12/13/20 1003  TSH 1.93    BMP Recent Labs    08/11/20 1131 09/17/20 0842 12/13/20 1003  NA 141 143 139  K 3.5 3.8 3.2*  CL 103 104 101  CO2 27 29 30   GLUCOSE 122* 111* 112*  BUN 29* 19 15  CREATININE 2.09* 1.51* 1.60*  CALCIUM 9.2 9.4 9.3  GFRNONAA 24* 36*  --     HEMOGLOBIN A1C Lab Results  Component Value Date   HGBA1C 6.2 12/13/2020    IMPRESSION:    ICD-10-CM   1.  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 Essential hypertension  I10 EKG 12-Lead    3. Prediabetes  R73.03     4. Hyperlipidemia  E78.5        RECOMMENDATIONS: Katie [name] is a 76 y.o. female whose past medical history and cardiac risk factors include: History of DVT, GERD, hypertension chronic kidney disease stage IIIb, acquired hypothyroidism, hyperlipidemia, diabetes, advanced age, postmenopausal female.  Nonrheumatic tricuspid valve regurgitation Clinical appears euvolemic and not in congestive heart failure. We will reschedule her echocardiogram in the coming weeks.  As long as the severity of tricuspid regurgitation has remained stable/improved no additional work-up warranted. Monitor for now  Essential hypertension Office blood pressures are within acceptable range. Medications reconciled. Reemphasized importance of a low-salt diet.  Hyperlipidemia Currently on rosuvastatin She denies myalgia or other side effects. Most recent lipids dated October 2022 reviewed as noted above. Currently managed by primary care provider.  FINAL MEDICATION LIST END OF ENCOUNTER: No orders of the defined types were placed in this encounter.   Medications Discontinued During This Encounter  Medication Reason   acetaminophen (TYLENOL) 160  MG/5ML solution    potassium chloride (KLOR-CON) 8 MEQ tablet      Current Outpatient Medications:    ACCU-CHEK AVIVA PLUS test strip, , Disp: , Rfl:    allopurinol (ZYLOPRIM) 100 MG tablet, Take 1 tablet (100 mg total) by mouth daily., Disp: 90 tablet, Rfl: 2   ALPRAZolam (XANAX) 0.5 MG tablet, TAKE 1 TABLET BY MOUTH 3  TIMES DAILY AS NEEDED FOR  ANXIETY OR SLEEP (Patient taking differently: Take 0.5 mg by mouth 2 (two) times daily as needed for anxiety.), Disp: 270 tablet, Rfl: 0   aspirin 81 MG chewable tablet, Chew 81 mg by mouth every morning., Disp: , Rfl:    cyanocobalamin (,VITAMIN B-12,) 1000 MCG/ML injection, INJECT 1 ML  ONCE EVERY MONTH (Patient taking differently: Inject 1,000 mcg into the muscle every 30 (thirty) days.), Disp: 3 mL, Rfl: 0   Digestive Enzymes (DIGESTIVE ENZYME PO), Take 2 capsules by mouth every morning., Disp: , Rfl:    Dulaglutide (TRULICITY) 1.5 MV/7.8IO SOPN, Inject 1.5 mg into the skin See admin instructions. Inject 0.5 mls (1.5 mg) subcutaneously every other Sunday, Disp: , Rfl:    famotidine (PEPCID) 20 MG tablet, Take 2 tablets by mouth once daily, Disp: 180 tablet, Rfl: 0   Lancet Devices (SIMPLE DIAGNOSTICS LANCING DEV) MISC, , Disp: , Rfl:    levothyroxine (SYNTHROID, LEVOTHROID) 88 MCG tablet, Take 88 mcg by mouth daily before breakfast., Disp: , Rfl:    linaclotide (LINZESS) 290 MCG CAPS capsule, Take 290 mcg by mouth as needed., Disp: , Rfl:    magnesium hydroxide (DULCOLAX) 400 MG/5ML suspension, Take 15 mLs by mouth daily as needed for mild constipation., Disp: , Rfl:    Polyethyl Glycol-Propyl Glycol (SYSTANE) 0.4-0.3 % SOLN, Place 1 drop into both eyes daily as needed (dry eyes)., Disp: , Rfl:    rosuvastatin (CRESTOR) 10 MG tablet, Take 10 mg by mouth every morning., Disp: , Rfl:    vitamin E 180 MG (400 UNITS) capsule, Take 400 Units by mouth every morning., Disp: , Rfl:   Orders Placed This Encounter  Procedures   EKG 12-Lead    There are  no Patient Instructions on file for this visit.   --Continue cardiac medications as reconciled in final medication list. --Return in about 1 year (around 07/14/2022) for Follow up. Or sooner if needed. --Continue follow-up  with your primary care physician regarding the management of your other chronic comorbid conditions.  Patient's questions and concerns were addressed to her satisfaction. She voices understanding of the instructions provided during this encounter.   This note was created using a voice recognition software as a result there may be grammatical errors inadvertently enclosed that do not reflect the nature of this encounter. Every attempt is made to correct such errors.  Total time spent: 21 minutes.  Katie [name], [place], [facility]  Pager: [phone] Office: [phone]

## 2021-07-14 ENCOUNTER — Encounter: Payer: Self-pay | Admitting: Cardiology

## 2021-07-24 ENCOUNTER — Other Ambulatory Visit: Payer: Self-pay | Admitting: Internal Medicine

## 2021-07-24 DIAGNOSIS — F411 Generalized anxiety disorder: Secondary | ICD-10-CM

## 2021-07-27 ENCOUNTER — Other Ambulatory Visit: Payer: Self-pay

## 2021-07-27 ENCOUNTER — Ambulatory Visit: Payer: Medicare Other

## 2021-07-27 DIAGNOSIS — I361 Nonrheumatic tricuspid (valve) insufficiency: Secondary | ICD-10-CM

## 2021-08-02 ENCOUNTER — Other Ambulatory Visit: Payer: Self-pay | Admitting: Internal Medicine

## 2021-08-02 DIAGNOSIS — E538 Deficiency of other specified B group vitamins: Secondary | ICD-10-CM

## 2021-08-02 NOTE — Progress Notes (Signed)
Spoke to patient she is scheduled to come in next week

## 2021-08-10 ENCOUNTER — Other Ambulatory Visit: Payer: Self-pay

## 2021-08-10 ENCOUNTER — Encounter: Payer: Self-pay | Admitting: Cardiology

## 2021-08-10 ENCOUNTER — Ambulatory Visit: Payer: Medicare Other | Admitting: Cardiology

## 2021-08-10 VITALS — BP 150/82 | HR 82 | Temp 98.0°F | Resp 17 | Ht 64.0 in | Wt 162.4 lb

## 2021-08-10 DIAGNOSIS — I429 Cardiomyopathy, unspecified: Secondary | ICD-10-CM

## 2021-08-10 DIAGNOSIS — E785 Hyperlipidemia, unspecified: Secondary | ICD-10-CM

## 2021-08-10 DIAGNOSIS — I10 Essential (primary) hypertension: Secondary | ICD-10-CM

## 2021-08-10 DIAGNOSIS — I340 Nonrheumatic mitral (valve) insufficiency: Secondary | ICD-10-CM

## 2021-08-10 DIAGNOSIS — N184 Chronic kidney disease, stage 4 (severe): Secondary | ICD-10-CM

## 2021-08-10 MED ORDER — ISOSORB DINITRATE-HYDRALAZINE 20-37.5 MG PO TABS: 1.0000 | ORAL_TABLET | Freq: Three times a day (TID) | ORAL | 2 refills | Status: DC

## 2021-08-10 NOTE — Progress Notes (Signed)
? ?IDTyquasia Pant, DOB March 08, 1946, MRN [id number] ? ?PCP:  Janith Lima, MD  ?Cardiologist:  Rex Kras, DO, [facility] (established care 06/03/2020) ? ?Date: 08/10/21 ?Last Office Visit: 07/13/2021 ? ?Chief Complaint  ?Patient presents with  ? Follow-up  ? Nonrheumatic tricuspid valve regurgitation  ? ? ?HPI  ?Cleveland Paiz is a 76 y.o. female whose past medical history and cardiovascular risk factors include: Newly discovered cardiomyopathy, History of DVT, GERD, hypertension chronic kidney disease stage IIIb/IV, acquired hypothyroidism, hyperlipidemia, prediabetes, advanced age, postmenopausal female. ? ?Initially referred to the practice for evaluation of an abnormal EKG.  Has undergone ischemic work-up in the past including an echo and stress test as outlined below.  The initial echocardiogram since establishing practice patient was noted to have moderate tricuspid regurgitation.  Patient came in earlier this month for her 1 year follow-up visit.  At that time she was doing well from a cardiovascular standpoint; however, her echocardiogram was still pending which was initially done to reevaluate the severity/progression of tricuspid regurgitation. ? ?Her most recent echocardiogram from July 27, 2021 notes mildly reduced LVEF, moderate MR/PR findings were new and therefore patient was requested to come in for reevaluation.  Clinically she denies chest pain or heart failure symptoms.  She is under a lot of stress with taking care of her husband as the primary caretaker, car troubles, and personal stressors.  Recently she is also noticed her blood pressures to be elevated more than usual. ? ?Recently followed up with nephrology and per patient she was discontinued from lisinopril and indapamide.  She has her next follow-up appointment with nephrology in 1 year. ? ?FUNCTIONAL STATUS: ?Prior to the pandemic she would regularly go to gym. No structured exercise program or daily routine.  ? ?ALLERGIES: ?Allergies  ?Allergen  Reactions  ? Lisinopril Other (See Comments) and Cough  ?  Cough and throat clearing  ? Metoprolol Succinate Other (See Comments)  ?  Unknown reaction  ? Penicillins Swelling  ?  Has patient had a PCN reaction causing immediate rash, facial/tongue/throat swelling, SOB or lightheadedness with hypotension: Yes ?Has patient had a PCN reaction causing severe rash involving mucus membranes or skin necrosis: No ?Has patient had a PCN reaction that required hospitalization: No ?Has patient had a PCN reaction occurring within the last 10 years: No ?If all of the above answers are "NO", then may proceed with Cephalosporin use. ?  ? Sulfamethoxazole Other (See Comments)  ?  Unknown reaction  ? Sulfamethoxazole-Trimethoprim Other (See Comments)  ?  Unknown reaction  ? Trimethoprim Other (See Comments)  ?  Unknown reaction  ? Metformin Nausea Only  ? ? ?MEDICATION LIST PRIOR TO VISIT: ?Current Meds  ?Medication Sig  ? ACCU-CHEK AVIVA PLUS test strip   ? allopurinol (ZYLOPRIM) 100 MG tablet Take 1 tablet (100 mg total) by mouth daily.  ? ALPRAZolam (XANAX) 0.5 MG tablet TAKE 1 TABLET BY MOUTH 3  TIMES DAILY AS NEEDED FOR  ANXIETY OR SLEEP  ? aspirin 81 MG chewable tablet Chew 81 mg by mouth every morning.  ? cyanocobalamin (,VITAMIN B-12,) 1000 MCG/ML injection INJECT 1 ML  ONCE EVERY MONTH  ? Digestive Enzymes (DIGESTIVE ENZYME PO) Take 2 capsules by mouth every morning.  ? Dulaglutide (TRULICITY) 1.5 WU/9.8JX SOPN Inject 1.5 mg into the skin See admin instructions. Inject 0.5 mls (1.5 mg) subcutaneously every other Sunday  ? famotidine (PEPCID) 20 MG tablet Take 2 tablets by mouth once daily  ? isosorbide-hydrALAZINE (BIDIL) 20-37.5 MG  tablet Take 1 tablet by mouth 3 (three) times daily.  ? Lancet Devices (SIMPLE DIAGNOSTICS LANCING DEV) MISC   ? levothyroxine (SYNTHROID, LEVOTHROID) 88 MCG tablet Take 88 mcg by mouth daily before breakfast.  ? linaclotide (LINZESS) 290 MCG CAPS capsule Take 290 mcg by mouth as needed.  ?  magnesium hydroxide (DULCOLAX) 400 MG/5ML suspension Take 15 mLs by mouth daily as needed for mild constipation.  ? Polyethyl Glycol-Propyl Glycol (SYSTANE) 0.4-0.3 % SOLN Place 1 drop into both eyes daily as needed (dry eyes).  ? rosuvastatin (CRESTOR) 10 MG tablet Take 10 mg by mouth every morning.  ? vitamin E 180 MG (400 UNITS) capsule Take 400 Units by mouth every morning.  ?  ? ?PAST MEDICAL HISTORY: ?Past Medical History:  ?Diagnosis Date  ? Acute venous embolism and thrombosis of unspecified deep vessels of lower extremity 2001  ? RLE DVT and PE, coumadin x 49mo now ASSA  ? Anxiety state, unspecified   ? Chronic idiopathic constipation 11/02/2016  ? Degeneration of cervical intervertebral disc   ? Gallstones   ? History of DVT (deep vein thrombosis)   ? Iron deficiency anemia secondary to blood loss (chronic)   ? Irritable bowel syndrome   ? Lumbago   ? Other B-complex deficiencies   ? Personal history of colonic polyps   ? adenomatous  ? Pure hypercholesterolemia   ? Reflux esophagitis   ? Tubular adenoma of colon 09/2002  ? Type II or unspecified type diabetes mellitus without mention of complication, not stated as uncontrolled   ? Unspecified essential hypertension   ? Unspecified hypothyroidism   ? Unspecified iridocyclitis   ? Unspecified venous (peripheral) insufficiency   ? Unspecified vitamin D deficiency   ? ? ?PAST SURGICAL HISTORY: ?Past Surgical History:  ?Procedure Laterality Date  ? BIOPSY  04/24/2018  ? Procedure: BIOPSY;  Surgeon: MIrving Copas, MD;  [facility];  Service: Gastroenterology;;  ? CARPAL TUNNEL RELEASE    ? CATARACT EXTRACTION Bilateral 08/29/2017,09/19/17  ? ESOPHAGOGASTRODUODENOSCOPY (EGD) WITH PROPOFOL N/A 04/24/2018  ? Procedure: ESOPHAGOGASTRODUODENOSCOPY (EGD) WITH PROPOFOL;  Surgeon: MRush LandmarkGTelford Nab, MD;  [facility];  Service: Gastroenterology;  Laterality: N/A;  with EMR  ? FEMORAL HERNIA REPAIR    ? OOPHORECTOMY    ? POLYPECTOMY   04/24/2018  ? Procedure: POLYPECTOMY;  Surgeon: MRush LandmarkGTelford Nab, MD;  [facility];  Service: Gastroenterology;;  ? UPPER ESOPHAGEAL ENDOSCOPIC ULTRASOUND (EUS)  04/24/2018  ? Procedure: UPPER ESOPHAGEAL ENDOSCOPIC ULTRASOUND (EUS);  Surgeon: MRush LandmarkGTelford Nab, MD;  [facility];  Service: Gastroenterology;;  ? VESICOVAGINAL FISTULA CLOSURE W/ TAH    ? ? ?FAMILY HISTORY: ?The patient family history includes Deep vein thrombosis in her mother; Diabetes in her mother; Healthy in her sister; Hypertension in her mother; Prostate cancer in her father. ? ?SOCIAL HISTORY:  ?The patient  reports that she has never smoked. She has never used smokeless tobacco. She reports current alcohol use. She reports that she does not use drugs. ? ?REVIEW OF SYSTEMS: ?Review of Systems  ?Cardiovascular:  Negative for chest pain, dyspnea on exertion, leg swelling, orthopnea, palpitations, paroxysmal nocturnal dyspnea and syncope.  ?Respiratory:  Negative for shortness of breath.   ? ?PHYSICAL EXAM: ? ?  08/10/2021  ? 11:10 AM 08/10/2021  ? 10:32 AM 08/10/2021  ? 10:31 AM  ?Vitals with BMI  ?Height   '5\' 4"'$   ?Weight   162 lbs 6 oz  ?BMI   27.86  ?Systolic 1625  166 165  ?Diastolic 82 86 92  ?Pulse 82 86 93  ? ? ?CONSTITUTIONAL: Well-developed and well-nourished. No acute distress.  ?SKIN: Skin is warm and dry. No rash noted. No cyanosis. No pallor. No jaundice ?HEAD: Normocephalic and atraumatic.  ?EYES: No scleral icterus.  Arcus senilis. ?MOUTH/THROAT: Moist oral membranes.  ?NECK: No JVD present. No thyromegaly noted. No carotid bruits  ?LYMPHATIC: No visible cervical adenopathy.  ?CHEST Normal respiratory effort. No intercostal retractions  ?LUNGS: Clear to auscultation bilaterally. No stridor. No wheezes. No rales.  ?CARDIOVASCULAR: Regular rate and rhythm, positive N4-M7, soft holosystolic murmur left sternal border, no rubs or gallops appreciated. ?ABDOMINAL: soft, nontender, nondistended, positive bowel  sounds all 4 quadrants. No apparent ascites.  ?EXTREMITIES: No peripheral edema, warm to touch ?HEMATOLOGIC: No significant bruising ?NEUROLOGIC: Oriented to person, place, and time. Nonfocal. Normal muscl

## 2021-08-15 ENCOUNTER — Ambulatory Visit: Payer: Medicare Other | Admitting: Internal Medicine

## 2021-08-15 ENCOUNTER — Other Ambulatory Visit: Payer: Self-pay

## 2021-08-15 ENCOUNTER — Telehealth: Payer: Self-pay | Admitting: Pharmacist

## 2021-08-15 ENCOUNTER — Encounter: Payer: Self-pay | Admitting: Internal Medicine

## 2021-08-15 VITALS — BP 130/72 | HR 110 | Temp 97.4°F | Ht 64.0 in | Wt 160.0 lb

## 2021-08-15 DIAGNOSIS — N1832 Chronic kidney disease, stage 3b: Secondary | ICD-10-CM

## 2021-08-15 DIAGNOSIS — R7303 Prediabetes: Secondary | ICD-10-CM

## 2021-08-15 DIAGNOSIS — Z0000 Encounter for general adult medical examination without abnormal findings: Secondary | ICD-10-CM | POA: Diagnosis not present

## 2021-08-15 DIAGNOSIS — E039 Hypothyroidism, unspecified: Secondary | ICD-10-CM | POA: Diagnosis not present

## 2021-08-15 DIAGNOSIS — E785 Hyperlipidemia, unspecified: Secondary | ICD-10-CM

## 2021-08-15 DIAGNOSIS — D472 Monoclonal gammopathy: Secondary | ICD-10-CM

## 2021-08-15 DIAGNOSIS — I10 Essential (primary) hypertension: Secondary | ICD-10-CM

## 2021-08-15 DIAGNOSIS — I429 Cardiomyopathy, unspecified: Secondary | ICD-10-CM

## 2021-08-15 DIAGNOSIS — E538 Deficiency of other specified B group vitamins: Secondary | ICD-10-CM | POA: Diagnosis not present

## 2021-08-15 DIAGNOSIS — Z0001 Encounter for general adult medical examination with abnormal findings: Secondary | ICD-10-CM

## 2021-08-15 DIAGNOSIS — N184 Chronic kidney disease, stage 4 (severe): Secondary | ICD-10-CM

## 2021-08-15 LAB — BASIC METABOLIC PANEL
BUN: 11 mg/dL (ref 6–23)
CO2: 29 mEq/L (ref 19–32)
Calcium: 10.2 mg/dL (ref 8.4–10.5)
Chloride: 106 mEq/L (ref 96–112)
Creatinine, Ser: 1.09 mg/dL (ref 0.40–1.20)
GFR: 49.31 mL/min — ABNORMAL LOW (ref >60.00)
Glucose, Bld: 94 mg/dL (ref 70–99)
Potassium: 4.4 mEq/L (ref 3.5–5.1)
Sodium: 140 mEq/L (ref 135–145)

## 2021-08-15 LAB — CBC WITH DIFFERENTIAL/PLATELET
Basophils Absolute: 0 10*3/uL (ref 0.0–0.1)
Basophils Relative: 0.9 % (ref 0.0–3.0)
Eosinophils Absolute: 0.2 10*3/uL (ref 0.0–0.7)
Eosinophils Relative: 4.6 % (ref 0.0–5.0)
HCT: 39.7 % (ref 36.0–46.0)
Hemoglobin: 13.5 g/dL (ref 12.0–15.0)
Lymphocytes Relative: 35.8 % (ref 12.0–46.0)
Lymphs Abs: 1.6 10*3/uL (ref 0.7–4.0)
MCHC: 34.1 g/dL (ref 30.0–36.0)
MCV: 84.5 fl (ref 78.0–100.0)
Monocytes Absolute: 0.5 10*3/uL (ref 0.1–1.0)
Monocytes Relative: 12.1 % — ABNORMAL HIGH (ref 3.0–12.0)
Neutro Abs: 2 10*3/uL (ref 1.4–7.7)
Neutrophils Relative %: 46.6 % (ref 43.0–77.0)
Platelets: 203 10*3/uL (ref 150.0–400.0)
RBC: 4.69 Mil/uL (ref 3.87–5.11)
RDW: 14.9 % (ref 11.5–15.5)
WBC: 4.3 10*3/uL (ref 4.0–10.5)

## 2021-08-15 LAB — URINALYSIS, ROUTINE W REFLEX MICROSCOPIC
Bilirubin Urine: NEGATIVE
Hgb urine dipstick: NEGATIVE
Nitrite: NEGATIVE
RBC / HPF: NONE SEEN (ref 0–?)
Specific Gravity, Urine: >=1.03 — AB (ref 1.000–1.030)
Urine Glucose: NEGATIVE
Urobilinogen, UA: 0.2 (ref 0.0–1.0)
pH: 5.5 (ref 5.0–8.0)

## 2021-08-15 LAB — HEMOGLOBIN A1C: Hgb A1c MFr Bld: 5.3 % (ref 4.6–6.5)

## 2021-08-15 LAB — IBC + FERRITIN
Ferritin: 53.5 ng/mL (ref 10.0–291.0)
Iron: 52 ug/dL (ref 42–145)
Saturation Ratios: 14.9 % — ABNORMAL LOW (ref 20.0–50.0)
TIBC: 350 ug/dL (ref 250.0–450.0)
Transferrin: 250 mg/dL (ref 212.0–360.0)

## 2021-08-15 LAB — HEPATIC FUNCTION PANEL
ALT: 12 U/L (ref 0–35)
AST: 20 U/L (ref 0–37)
Albumin: 4.2 g/dL (ref 3.5–5.2)
Alkaline Phosphatase: 59 U/L (ref 39–117)
Bilirubin, Direct: 0.3 mg/dL (ref 0.0–0.3)
Total Bilirubin: 1.3 mg/dL — ABNORMAL HIGH (ref 0.2–1.2)
Total Protein: 7.4 g/dL (ref 6.0–8.3)

## 2021-08-15 LAB — FOLATE: Folate: 17.3 ng/mL (ref >5.9)

## 2021-08-15 LAB — TSH: TSH: 4.41 u[IU]/mL (ref 0.35–5.50)

## 2021-08-15 LAB — LIPID PANEL
Cholesterol: 126 mg/dL (ref 0–200)
HDL: 82.7 mg/dL (ref >39.00)
LDL Cholesterol: 33 mg/dL (ref 0–99)
NonHDL: 42.87
Total CHOL/HDL Ratio: 2
Triglycerides: 49 mg/dL (ref 0.0–149.0)
VLDL: 9.8 mg/dL (ref 0.0–40.0)

## 2021-08-15 LAB — VITAMIN B12: Vitamin B-12: 368 pg/mL (ref 211–911)

## 2021-08-15 NOTE — Patient Instructions (Signed)

## 2021-08-15 NOTE — Progress Notes (Signed)
? ?Subjective:  ?Patient ID: Katie [name], female    DOB: [birth year]  Age: 76 y.o. MRN: [id number] ? ?CC: Annual Exam, Hypertension, and Hypothyroidism ? ? ?HPI ?Katie [name] presents for a CPX and f/up - ? ?She has caregiver stress from taking care of her husband and does not know that to do about this other than to take xanax.  She recently saw cardiologist as well as added some new medications but they have caused headaches so she does not think she is going to continue taking them.  She denies dizziness, lightheadedness, palpitations, diaphoresis, or edema. ? ?Outpatient Medications Prior to Visit  ?Medication Sig Dispense Refill  ? ACCU-CHEK AVIVA PLUS test strip     ? allopurinol (ZYLOPRIM) 100 MG tablet Take 1 tablet (100 mg total) by mouth daily. 90 tablet 2  ? ALPRAZolam (XANAX) 0.5 MG tablet TAKE 1 TABLET BY MOUTH 3  TIMES DAILY AS NEEDED FOR  ANXIETY OR SLEEP 270 tablet 0  ? aspirin 81 MG chewable tablet Chew 81 mg by mouth every morning.    ? cyanocobalamin (,VITAMIN B-12,) 1000 MCG/ML injection INJECT 1 ML  ONCE EVERY MONTH 3 mL 0  ? Digestive Enzymes (DIGESTIVE ENZYME PO) Take 2 capsules by mouth every morning.    ? Dulaglutide (TRULICITY) 1.5 HX/5.0VW SOPN Inject 1.5 mg into the skin See admin instructions. Inject 0.5 mls (1.5 mg) subcutaneously every other Sunday    ? famotidine (PEPCID) 20 MG tablet Take 2 tablets by mouth once daily 180 tablet 0  ? isosorbide-hydrALAZINE (BIDIL) 20-37.5 MG tablet Take 1 tablet by mouth 3 (three) times daily. 90 tablet 2  ? Lancet Devices (SIMPLE DIAGNOSTICS LANCING DEV) MISC     ? levothyroxine (SYNTHROID, LEVOTHROID) 88 MCG tablet Take 88 mcg by mouth daily before breakfast.    ? linaclotide (LINZESS) 290 MCG CAPS capsule Take 290 mcg by mouth as needed.    ? magnesium hydroxide (DULCOLAX) 400 MG/5ML suspension Take 15 mLs by mouth daily as needed for mild constipation.    ? Polyethyl Glycol-Propyl Glycol (SYSTANE) 0.4-0.3 % SOLN Place 1 drop into both eyes daily as  needed (dry eyes).    ? rosuvastatin (CRESTOR) 10 MG tablet Take 10 mg by mouth every morning.    ? vitamin E 180 MG (400 UNITS) capsule Take 400 Units by mouth every morning.    ? ?No facility-administered medications prior to visit.  ? ? ?ROS ?Review of Systems  ?Constitutional: Negative.  Negative for diaphoresis and fatigue.  ?HENT: Negative.    ?Eyes: Negative.   ?Respiratory:  Negative for chest tightness, shortness of breath and wheezing.   ?Cardiovascular:  Negative for chest pain, palpitations and leg swelling.  ?Gastrointestinal:  Negative for abdominal pain, constipation, diarrhea, nausea and vomiting.  ?Endocrine: Negative.   ?Genitourinary: Negative.  Negative for difficulty urinating.  ?Musculoskeletal: Negative.  Negative for myalgias.  ?Skin: Negative.  Negative for color change.  ?Neurological:  Positive for headaches. Negative for dizziness and weakness.  ?Hematological:  Negative for adenopathy. Does not bruise/bleed easily.  ?Psychiatric/Behavioral:  Positive for dysphoric mood. Negative for confusion, decreased concentration, sleep disturbance and suicidal ideas. The patient is nervous/anxious.   ? ?Objective:  ?BP 130/72 (BP Location: Right Arm, Patient Position: Sitting, Cuff Size: Large)   Pulse (!) 110   Temp (!) 97.4 ?F (36.3 ?C) (Oral)   Ht '5\' 4"'$  (1.626 m)   Wt 160 lb (72.6 kg)   SpO2 95%   BMI 27.46 kg/m?  ? ?BP  Readings from Last 3 Encounters:  ?08/15/21 130/72  ?08/10/21 (!) 150/82  ?07/13/21 133/85  ? ? ?Wt Readings from Last 3 Encounters:  ?08/15/21 160 lb (72.6 kg)  ?08/10/21 162 lb 6.4 oz (73.7 kg)  ?07/13/21 159 lb 6.4 oz (72.3 kg)  ? ? ?Physical Exam ?Vitals reviewed.  ?Constitutional:   ?   Appearance: Normal appearance.  ?HENT:  ?   Mouth/Throat:  ?   Mouth: Mucous membranes are moist.  ?Eyes:  ?   General: No scleral icterus. ?   Conjunctiva/sclera: Conjunctivae normal.  ?Cardiovascular:  ?   Rate and Rhythm: Regular rhythm. Tachycardia present.  ?   Heart sounds: Normal  heart sounds, S1 normal and S2 normal. No murmur heard. ?  No friction rub. No gallop.  ?Pulmonary:  ?   Effort: Pulmonary effort is normal.  ?   Breath sounds: No stridor. No wheezing, rhonchi or rales.  ?Abdominal:  ?   General: Abdomen is flat.  ?   Palpations: There is no mass.  ?   Tenderness: There is no abdominal tenderness. There is no guarding.  ?   Hernia: No hernia is present.  ?Musculoskeletal:  ?   Right lower leg: No edema.  ?   Left lower leg: No edema.  ?Skin: ?   General: Skin is warm and dry.  ?   Findings: No lesion.  ?Neurological:  ?   General: No focal deficit present.  ?   Mental Status: She is alert and oriented to person, place, and time. Mental status is at baseline.  ?Psychiatric:     ?   Mood and Affect: Mood normal.     ?   Behavior: Behavior normal.     ?   Thought Content: Thought content normal.     ?   Judgment: Judgment normal.  ? ? ?Lab Results  ?Component Value Date  ? WBC 4.3 08/15/2021  ? HGB 13.5 08/15/2021  ? HCT 39.7 08/15/2021  ? PLT 203.0 08/15/2021  ? GLUCOSE 94 08/15/2021  ? CHOL 126 08/15/2021  ? TRIG 49.0 08/15/2021  ? HDL 82.70 08/15/2021  ? LDLDIRECT 105.9 07/04/2007  ? [place] 33 08/15/2021  ? ALT 12 08/15/2021  ? AST 20 08/15/2021  ? NA 140 08/15/2021  ? K 4.4 08/15/2021  ? CL 106 08/15/2021  ? CREATININE 1.09 08/15/2021  ? BUN 11 08/15/2021  ? CO2 29 08/15/2021  ? TSH 4.41 08/15/2021  ? HGBA1C 5.3 08/15/2021  ? MICROALBUR <0.7 12/30/2018  ? ? ?DG Foot Complete Right ? ?Result Date: 06/17/2021 ?CLINICAL DATA:  Right foot pain EXAM: RIGHT FOOT COMPLETE - 3+ VIEW COMPARISON:  None. FINDINGS: No acute fracture or dislocation identified. Degenerative changes in the midfoot with mild joint space narrowing and dorsal osteophytes. Mild degenerative changes at the first metatarsophalangeal joint. Prominent plantar calcaneal spur. Mild soft tissue swelling in the forefoot. IMPRESSION: Degenerative changes with no acute osseous abnormality identified. Mild soft tissue swelling.  Electronically Signed   By: Ofilia Neas M.D.   On: 06/17/2021 12:18  ? ?VAS [name] LOWER EXTREMITY VENOUS (DVT) (ONLY [facility] & [facility]) ? ?Result Date: 06/18/2021 ? Lower Venous DVT Study Patient Name:  RUEY STORER  Date of Exam:   06/17/2021 Medical Rec #: [id number]   Accession #:    [id number] Date of Birth: January 12, 1946   Patient Gender: F Patient Age:   52 years Exam [facility] Procedure:      VAS [name] LOWER EXTREMITY VENOUS (  DVT) Referring Phys: Myna Bright --------------------------------------------------------------------------------  Indications: Pain, and Edema.  Comparison Study: No prior study Performing Technologist: Maudry Mayhew MHA, RDMS, RVT, RDCS  Examination Guidelines: A complete evaluation includes B-mode imaging, spectral Doppler, color Doppler, and power Doppler as needed of all accessible portions of each vessel. Bilateral testing is considered an integral part of a complete examination. Limited examinations for reoccurring indications may be performed as noted. The reflux portion of the exam is performed with the patient in reverse Trendelenburg.  +---------+---------------+---------+-----------+----------+--------------+ RIGHT    CompressibilityPhasicitySpontaneityPropertiesThrombus Aging +---------+---------------+---------+-----------+----------+--------------+ CFV      Full           Yes      Yes                                 +---------+---------------+---------+-----------+----------+--------------+ SFJ      Full                                                        +---------+---------------+---------+-----------+----------+--------------+ FV Prox  Full                                                        +---------+---------------+---------+-----------+----------+--------------+ FV Mid   Full                                                        +---------+---------------+---------+-----------+----------+--------------+ FV  DistalFull                                                        +---------+---------------+---------+-----------+----------+--------------+ PFV      Full                                                        +---------+---------------+---------+----

## 2021-08-15 NOTE — Telephone Encounter (Signed)
Pt has been taking new start BiDil 20/37.5 mg BID since Thursday of last week. Home BP has been slowly improving since then, but pt noticed that she has been having persistent headaches since last week. Notes headaches have been nagging and bothersome. Pain rating of 5-6/10. Pt stated that she held her morning dose this morning and was seen by PCP earlier today. BP at PCP office of 130/72 HR: 111. Recommended pt hold BiDil for the next 2-3 days and continue  home BP monitoring. If home BP readings start trending back up and headache complains improve, will consider starting hydralazine 50 mg TID. Will follow up with pt on Thursday to review pt's home BP readings and symptoms  ?

## 2021-08-16 ENCOUNTER — Encounter: Payer: Self-pay | Admitting: Internal Medicine

## 2021-08-16 ENCOUNTER — Telehealth: Payer: Self-pay | Admitting: *Deleted

## 2021-08-16 NOTE — Chronic Care Management (AMB) (Signed)
?  Care Management  ? ?Note ? ?08/16/2021 ?Name: Katie [name] MRN: [id number] DOB: 07-Aug-1945 ? ?Katie [name] is a 76 y.o. year old female who is a primary care patient of Janith Lima, MD. I reached out to Derl Barrow by phone today offer care coordination services.  ? ?Katie [name] was given information about care management services today including:  ?Care management services include personalized support from designated clinical staff supervised by her physician, including individualized plan of care and coordination with other care providers ?24/7 contact phone numbers for assistance for urgent and routine care needs. ?The patient may stop care management services at any time by phone call to the office staff. ? ?Patient agreed to services and verbal consent obtained.  ? ?Follow up plan: ?Telephone appointment with care management team member scheduled for:08/24/21 ? ?Laverda Sorenson  ?Care Guide, Embedded Care Coordination ?[place]  Care Management  ?Direct Dial: [phone] ? ?

## 2021-08-24 ENCOUNTER — Ambulatory Visit: Payer: Medicare Other | Admitting: Licensed Clinical Social Worker

## 2021-08-24 DIAGNOSIS — F439 Reaction to severe stress, unspecified: Secondary | ICD-10-CM

## 2021-08-24 DIAGNOSIS — F419 Anxiety disorder, unspecified: Secondary | ICD-10-CM

## 2021-08-24 NOTE — Patient Instructions (Signed)
Visit Information ? ?Thank you for taking time to visit with me today. Please don't hesitate to contact me if I can be of assistance to you before our next scheduled telephone appointment. ? ?Our next appointment is by telephone on April 26th at 1:15 ? ?Please call the care guide team at [phone] if you need to cancel or reschedule your appointment.  ? ?If you are experiencing a Mental Health or [place] or need someone to talk to, please call 1-800-273-TALK (toll free, 24 hour hotline)  ? ?Following is a copy of your full plan of care:  ?Care Plan : [employer]  ?Updates made by Maurine Cane, LCSW since 08/24/2021 12:00 AM  ?  ? ?Problem: Coping Skills (General Plan of Care)   ?  ? ?Goal: Coping Skills Enhanced   ?Start Date: 08/24/2021  ?This Visit's Progress: On track  ?Priority: High  ?Note:   ?Current Barriers:  ?Disease Management support and education needs  ? ?CSW Clinical Goal(s):  ?Patient  will demonstrate a reduction in symptoms related to :Anxiety  and stress through collaboration with Clinical Social Worker, provider, and care team.  ? ?Interventions: ?Inter-disciplinary care team collaboration (see longitudinal plan of care) ?Evaluation of current treatment plan related to  self management and patient's adherence to plan as established by provider ? ?Mental Health:  (Status: New goal.) ?Evaluation of current treatment plan related to Stress at home and anxiety ?Solution-Focused Strategies employed:  ?Active listening / Reflection utilized  ?Emotional Support Provided ?Behavioral Activation reviewed ?Problem Solving /Task Center strategies reviewed ?Provided psychoeducation for mental health needs  ?Provided EMMI education information on managing anxiety  ? ?Task & activities to accomplish goals: ?Call [facility] to follow up on co-pay for counseling ?Review your EMMI educational information (Managing Anxiety Around Daily Task; Movement: Emotional Health) Look for an  e-mail from [facility]. ?Continue with exercise within your limites as discussed with your doctor. ?  ? ? ?Ms. Music was given information about Care Management services by the embedded care coordination team including:  ?Care Management services include personalized support from designated clinical staff supervised by her physician, including individualized plan of care and coordination with other care providers ?24/7 contact phone numbers for assistance for urgent and routine care needs. ?The patient may stop CCM services at any time (effective at the end of the month) by phone call to the office staff. ? ?Patient agreed to services and verbal consent obtained.  ? ?Patient verbalizes understanding of instructions and care plan provided today and agrees to view in [place]. Active MyChart status confirmed with patient.   ? ?Casimer Lanius, LCSW ?Licensed Clinical Social Worker [name]  ?[place]  ?[phone]  ? ?  ?

## 2021-08-24 NOTE — Chronic Care Management (AMB) (Signed)
Care Management ?Clinical Social Work Note ? ?08/24/2021 ?Name: Katie [name] MRN: [id number] DOB: 01-11-1946 ? ?Katie [name] is a 76 y.o. year old female who is a primary care patient of Katie [name].  The Care Management team was consulted for assistance with chronic disease management and coordination needs. ? ?Engaged with patient by telephone for initial visit in response to provider referral for social work chronic care management and care coordination services ? ?Consent to Services:  ?Ms. Parmar was given information about Care Management services today including:  ?Care Management services includes personalized support from designated clinical staff supervised by her physician, including individualized plan of care and coordination with other care providers ?24/7 contact phone numbers for assistance for urgent and routine care needs. ?The patient may stop case management services at any time by phone call to the office staff. ? ?Patient agreed to services and consent obtained.  ? ?Summary: Assessed patient's previous and current treatment, coping skills, support system and barriers to care. She is currently experiencing symptoms of  anxiety which seems to be exacerbated by stress at home..  See Care Plan below for interventions and patient self-care actives. ? ?Recommendation: Patient may benefit from, and is in agreement to explore interventions discussed today, and follow up with insurance provider with questions about her co-pay before moving forward with therapy.  ? ?Follow up Plan: Patient would like continued follow-up from CCM LCSW.  per patient's request will follow up in 2 weeks.  Will call office if needed prior to next encounter. ?  ?Assessment: Review of patient past medical history, allergies, medications, and health status, including review of relevant consultants reports was performed today as part of a comprehensive evaluation and provision of chronic care management and care coordination  services. ? ?SDOH (Social Determinants of Health) assessments and interventions performed:  ?SDOH Interventions   ? ?Flowsheet Row Most Recent Value  ?SDOH Interventions   ?Stress Interventions Provide Counseling  ? ?  ?  ? ?Advanced Directives Status: Not addressed in this encounter. ? ?Care Plan ?Conditions to be addressed/monitored: Anxiety and stress ;  ? ?Care Plan : LCSW Plan of Care  ?Updates made by Maurine Cane, LCSW since 08/24/2021 12:00 AM  ?  ? ?Problem: Coping Skills (General Plan of Care)   ?  ? ?Goal: Coping Skills Enhanced   ?Start Date: 08/24/2021  ?This Visit's Progress: On track  ?Priority: High  ?Note:   ?Current Barriers:  ?Disease Management support and education needs  ? ?CSW Clinical Goal(s):  ?Patient  will demonstrate a reduction in symptoms related to :Anxiety  and stress through collaboration with Clinical Social Worker, provider, and care team.  ? ?Interventions: ?Inter-disciplinary care team collaboration (see longitudinal plan of care) ?Evaluation of current treatment plan related to  self management and patient's adherence to plan as established by provider ? ?Mental Health:  (Status: New goal.) ?Evaluation of current treatment plan related to Stress at home and anxiety ?Solution-Focused Strategies employed:  ?Active listening / Reflection utilized  ?Emotional Support Provided ?Behavioral Activation reviewed ?Problem Solving /Task Center strategies reviewed ?Provided psychoeducation for mental health needs  ?Provided EMMI education information on managing anxiety  ? ?Task & activities to accomplish goals: ?Call [facility] Medicare to follow up on co-pay for counseling ?Review your EMMI educational information (Managing Anxiety Around Daily Task; Movement: Emotional Health) Look for an e-mail from [place]. ?Continue with exercise within your limites as discussed with your doctor. ?  ?  ?Katie [name]  Katie Flatten, LCSW ?Licensed Clinical Social Worker [name]  ?[place]  ?[phone]  ?

## 2021-08-26 ENCOUNTER — Telehealth: Payer: Self-pay

## 2021-08-26 NOTE — Telephone Encounter (Signed)
Pt is requesting a call back from you. She didn't go into details about the need of the call back. ? ?Please advise ?

## 2021-08-29 NOTE — Telephone Encounter (Signed)
You're welcome!

## 2021-08-30 MED ORDER — HYDRALAZINE HCL 25 MG PO TABS: 25.0000 mg | ORAL_TABLET | Freq: Three times a day (TID) | ORAL | 3 refills | Status: DC

## 2021-08-30 NOTE — Addendum Note (Signed)
Addended by: Manuela Schwartz T on: 08/30/2021 12:19 PM ? ? Modules accepted: Orders ? ?

## 2021-08-30 NOTE — Telephone Encounter (Signed)
Headache complains improved since stopping BiDil 20/37.5 mg TID. Home BP readings since had been staying steady at 120s-130s/80s. Pt reprts that she has been increasingly anxious regarding her overall health over the past few days and noticed her home BP readings trending up over the last day or two. Pt went to her local fire station to check her BP and was told that her BP at the time was 170/90. Pt went home and took her BiDil and BP readings improved to 106/64 at home. Pt reported that her lingering headaches returned since taking BiDil. ? ?Pt come to the office today to calibrate her home BP monitor. Home BP monitor readings of 160/90 HR: 93 vs. Office BP readings of 157/79 HR: 92. Pt okay to continue using her home BP monitor and continue monitoring closely. Reviewed with Dr. Terri Skains and reviewed plan to switch BiDil to hydralazine 25 mg TID for added BP control without lingering headache complains. Pt agreeable with the plan. Will follow up with pt in 1 week to ensure home BP readings continue to remain stable and controlled with goal BP <130/80. Pt planing on discussing anxiety management tips with her PCP. Pt also planing on returning back to the gym to do resistance based training to also improve her overall cardiac health in the meantime.  ?

## 2021-09-02 NOTE — Telephone Encounter (Signed)
NEW NOTE NOT NEEDED °

## 2021-09-07 ENCOUNTER — Ambulatory Visit: Payer: Medicare Other | Admitting: Licensed Clinical Social Worker

## 2021-09-07 ENCOUNTER — Encounter: Payer: Self-pay | Admitting: Licensed Clinical Social Worker

## 2021-09-07 DIAGNOSIS — F411 Generalized anxiety disorder: Secondary | ICD-10-CM

## 2021-09-07 DIAGNOSIS — I10 Essential (primary) hypertension: Secondary | ICD-10-CM

## 2021-09-07 NOTE — Chronic Care Management (AMB) (Signed)
Care Management ?Clinical Social Work Note ? ?09/07/2021 ?Name: Katie [name] MRN: [id number] DOB: 10-09-1945 ? ?Katie [name] is a 76 y.o. year old female who is a primary care patient of Katie Lima, MD.  The Care Management team was consulted for assistance with chronic disease management and coordination needs. ? ?Engaged with patient by telephone for follow up visit in response to provider referral for social work chronic care management and care coordination services ? ?Consent to Services:  ?Katie [name] was given information about Care Management services today including:  ?Care Management services includes personalized support from designated clinical staff supervised by her physician, including individualized plan of care and coordination with other care providers ?24/7 contact phone numbers for assistance for urgent and routine care needs. ?The patient may stop case management services at any time by phone call to the office staff. ? ?Patient agreed to services and consent obtained.  ? ?Summary:  Patient is currently experiencing symptoms of  anxiety which seems to be exacerbated by caregiver stress and health concerns..  See Care Plan below for interventions and patient self-care actives. ? ?Recommendation: Patient may benefit from, and is in agreement to implement interventions discussed today, connect with CCM RN to help manage blood pressure and complete paperwork for counseling.  ? ?Follow up Plan: Patient would like continued follow-up from CCM LCSW.  per patient's request will follow up in 2 weeks.  Will call office if needed prior to next encounter. ?  ?Assessment: Review of patient past medical history, allergies, medications, and health status, including review of relevant consultants reports was performed today as part of a comprehensive evaluation and provision of chronic care management and care coordination services. ? ?SDOH (Social Determinants of Health) assessments and interventions performed:   ?SDOH Interventions   ? ?Flowsheet Row Most Recent Value  ?SDOH Interventions   ?SDOH Interventions for the Following Domains Stress, Physical Activity  ?Physical Activity Interventions [employer]  ?Stress Interventions Provide Counseling  ? ?  ?  ? ?Advanced Directives Status: Not addressed in this encounter. ? ?Care Plan ?Conditions to be addressed/monitored: Anxiety;  ? ?Care Plan : LCSW Plan of Care  ?Updates made by Katie Cane, LCSW since 09/07/2021 12:00 AM  ?  ? ?Problem: Coping Skills (General Plan of Care)   ?  ? ?Goal: Coping Skills Enhanced   ?Start Date: 08/24/2021  ?This Visit's Progress: On track  ?Recent Progress: On track  ?Priority: High  ?Note:   ?Current Barriers:  ?Disease Management support and education needs  ? ?CSW Clinical Goal(s):  ?Patient  will demonstrate a reduction in symptoms related to :Anxiety  and stress through collaboration with Clinical Social Worker, provider, and care team.  ? ?Interventions: ?Inter-disciplinary care team collaboration (see longitudinal plan of care) ?Evaluation of current treatment plan related to  self management and patient's adherence to plan as established by provider ? ?Mental Health:  (Status: Goal on Track (progressing): YES.) ?Evaluation of current treatment plan related to Stress at home and anxiety ?Mindfulness or Relaxation training provided ?Active listening / Reflection utilized  ?Emotional Support Provided ?Behavioral Activation reviewed ?Provided psychoeducation for mental health needs  ?Discussed connecting CCM RN ?Made referral to [facility] ? ?Task & activities to accomplish goals: ?I have placed a referral [place] they will contact you.   ?Practice relaxed breathing 3 times a day based on scheduled you decided on( 8:00; 11:00 and 6:00 ?Continue with exercise within your limites as discussed with your doctor.  You would like to start going to the Gym May 1st ?Complete paperwork from [facility] when they mail it and return as soon as you can ?I have place a referral with the Nurse Katie [name], they will call you to schedule an appointment ?  ?  ?Katie Lanius, LCSW ?Licensed Clinical Social Worker [name]  ?[place]  ?[phone] ?

## 2021-09-07 NOTE — Patient Instructions (Addendum)
Visit Information ? ?Thank you for taking time to visit with me today. Please don't hesitate to contact me if I can be of assistance to you before our next scheduled telephone appointment. ? ?Following are the goals we discussed today: Coping Skills ? ?Task & activities to accomplish goals: ?I have placed a referral [place] they will contact you.   ?Practice relaxed breathing 3 times a day based on scheduled you decided on( 8:00; 11:00 and 6:00 ?Continue with exercise within your limites as discussed with your doctor. You would like to start going to the Gym May 1st ?Complete paperwork from [facility] when they mail it and return as soon as you can ?I have placed a referral with the Nurse Richarda Osmond, they will call you to schedule an appointment ? ?   ?  ?Casimer Lanius, LCSW ?Licensed Clinical Social Worker [name]  ?[place]  ?[phone] ? ?Our next appointment is by telephone on May 9th at 2:00 ? ?Please call the care guide team at [phone] if you need to cancel or reschedule your appointment.  ? ?If you are experiencing a Mental Health or [place] or need someone to talk to, please call 1-800-273-TALK (toll free, 24 hour hotline)  ? ?The patient verbalized understanding of instructions, educational materials, and care plan provided today and agreed to receive a mailed copy of patient instructions, educational materials, and care plan.  ? ? ?

## 2021-09-16 ENCOUNTER — Ambulatory Visit: Payer: Medicare Other | Admitting: *Deleted

## 2021-09-16 DIAGNOSIS — I10 Essential (primary) hypertension: Secondary | ICD-10-CM

## 2021-09-16 DIAGNOSIS — F439 Reaction to severe stress, unspecified: Secondary | ICD-10-CM

## 2021-09-16 DIAGNOSIS — R7303 Prediabetes: Secondary | ICD-10-CM

## 2021-09-20 ENCOUNTER — Ambulatory Visit: Payer: Medicare Other | Admitting: Licensed Clinical Social Worker

## 2021-09-20 DIAGNOSIS — F411 Generalized anxiety disorder: Secondary | ICD-10-CM

## 2021-09-20 DIAGNOSIS — F439 Reaction to severe stress, unspecified: Secondary | ICD-10-CM

## 2021-09-20 NOTE — Chronic Care Management (AMB) (Signed)
?Care Management  ? Clinical Social Work Note ? ?09/20/2021 ?Name: Katie [name] MRN: [id number] DOB: 09/24/1945 ? ?Katie [name] is a 76 y.o. year old female who is a primary care patient of Janith Lima, MD. The CCM team was consulted to assist the patient with chronic disease management and/or care coordination needs related to: Mental Health Counseling and Resources.  ? ?Engaged with patient by telephone for follow up visit in response to provider referral for social work care management and care coordination services.  ? ?Consent to Services:  ? ?Patient agreed to services and consent obtained.  ? ?Summary:  Patient is making progress with completing steps to connect for counseling and implementing relaxed breathing daily , but continues to have difficulty with completing forms,managing stress and worry due to car not working and caring for husband . Has not been able to go to the [facility] due to transportation however she has been able to walk in her community.  See Care Plan below for interventions and patient self-care actives. ? ?Recommendation: Patient may benefit from, and is in agreement to complete forms and return to [place].  ? ?Follow up Plan:  ?No follow up scheduled with CCM LCSW at this time. Will follow up in 30 days .They will call office if needed prior to next encounter. ?Will route chart to Care Guide to reschedule phone appointment   ?  ?Assessment: Review of patient past medical history, allergies, medications, and health status, including review of relevant consultants reports was performed today as part of a comprehensive evaluation and provision of chronic care management and care coordination services.    ? ?SDOH (Social Determinants of Health) assessments and interventions performed:  ?SDOH Interventions   ? ?Flowsheet Row Most Recent Value  ?SDOH Interventions   ?SDOH Interventions for the Following Domains Stress  ?Stress Interventions Provide Counseling  ? ?  ?   ? ?Advanced Directives Status: Not addressed in this encounter. ? ?CCM Care Plan ?Conditions to be addressed/monitored: Anxiety;  ? ?Care Plan : LCSW Plan of Care  ?Updates made by Maurine Cane, LCSW since 09/20/2021 12:00 AM  ?  ? ?Problem: Coping Skills   ?  ? ?Goal: Coping Skills Enhanced   ?Start Date: 08/24/2021  ?This Visit's Progress: On track  ?Recent Progress: On track  ?Priority: High  ?Note:   ?Current Barriers:  ?Disease Management support and education needs  ? ?CSW Clinical Goal(s):  ?Patient  will demonstrate a reduction in symptoms related to :Anxiety  and stress through collaboration with Clinical Social Worker, provider, and care team.  ? ?Interventions: ?Inter-disciplinary care team collaboration (see longitudinal plan of care) ?Evaluation of current treatment plan related to  self management and patient's adherence to plan as established by provider ? ?Mental Health:  (Status: Goal on Track (progressing): YES.) received paper work from [place] ?Evaluation of current treatment plan related to Stress at home and anxiety ?Mindfulness or Relaxation training provided ?Active listening / Reflection utilized  ?Emotional Support Provided ?Behavioral Activation reviewed ?Participation in counseling encouraged  ?Discussed other therapy options/ would like to continue with [place] ? ?Task & activities to accomplish goals: ?Practice relaxed breathing 3 times a day based on scheduled you decided on( 8:00; 11:00 and 6:00 ?Continue with exercise within your limites as discussed with your doctor. ?Complete paperwork from [place]  ?  ?  ? ?Casimer Lanius, LCSW ?Licensed Clinical Social Worker [name]  ?[place]  ?  [phone]  ? ? ?

## 2021-09-20 NOTE — Patient Instructions (Signed)
Visit Information ? ?Thank you for taking time to visit with me today. Please don't hesitate to contact me if I can be of assistance to you before our next scheduled telephone appointment. ? ?Following are the goals we discussed today: managing stress ?Task & activities to accomplish goals: ?Practice relaxed breathing 3 times a day based on scheduled you decided on( 8:00; 11:00 and 6:00 ?Continue with exercise within your limites as discussed with your doctor. ?Complete paperwork from [place]  ? ?The Care Guide will contact you to reschedule the phone appointment   ?No follow up scheduled, per our conversation I will contact you in 30 days.  Please call the office if needed prior to next encounter. ? ?Casimer Lanius, LCSW ?Licensed Clinical Social Worker [name]  ?[place]  ?[phone]  ? ?Please call the care guide team at [phone] if you need to cancel or reschedule your appointment.  ? ?If you are experiencing a Mental Health or [place] or need someone to talk to, please call 1-800-273-TALK (toll free, 24 hour hotline)  ? ?Patient verbalizes understanding of instructions and care plan provided today and agrees to view in [place]. Active MyChart status confirmed with patient.   ? ? ?

## 2021-09-21 NOTE — Patient Instructions (Signed)
Visit Information  ? ?Thank you for taking time to visit with me today. Please don't hesitate to contact me if I can be of assistance to you before our next scheduled telephone appointment. ? ?Following are the goals we discussed today:  ? ?Patient Goals/Self-Care Activities: ?Take medications as prescribed   ?Perform all self care activities independently  ?Perform IADL's (shopping, preparing meals, housekeeping, managing finances) independently ?Call provider office for new concerns or questions  ?Work with the Education officer, museum to address care coordination needs and will continue to work with the clinical team to address health care and disease management related needs ?check blood sugar at prescribed times: once daily and when you have symptoms of low or high blood sugar ?take the blood sugar log to all doctor visits ?wear comfortable, cotton socks ?wear comfortable, well-fitting shoes ?check blood pressure daily ?write blood pressure results in a log or diary ?take blood pressure log to all doctor appointments ?report new symptoms to your doctor ?Continue to exercise at least 150 minutes per week ?Take time for yourself and do something that you enjoy everyday ? ? ?Please call the care guide team at [phone] if you need to cancel or reschedule your appointment.  ? ?If you are experiencing a Mental Health or [place] or need someone to talk to, please call the [place] National Suicide Prevention Lifeline: 863-086-3711 or TTY: 248-181-0037 TTY [phone]) to talk to a trained counselor ?go to [facility] [address], [place] [phone])  ? ?Following is a copy of your full care plan:  ?Care Plan : [place]  ?Updates made by Ilean China, RN since 09/21/2021 12:00 AM  ?  ? ?Problem: Chronic Disease Management Needs   ?Priority: High  ?Onset Date: 09/16/2021  ?  ? ?Long-Range Goal: Patient will Work with Consulting civil engineer to Develop a Plan of [place] with HTN and Diabetes   ?Start Date: 09/16/2021  ?Expected End Date: 09/17/2022  ?This Visit's Progress: On track  ?Priority: High  ?Note:   ?Current Barriers:  ?Chronic Disease Management support and education needs related to HTN and DMII ?Caregiver strain ? ?RNCM Clinical Goal(s):  ?Patient will continue to work with RN Care Manager and/or Social Worker to address care management and care coordination needs related to HTN and DMII as evidenced by adherence to CM Team Scheduled appointments     ?work with Education officer, museum to address [place] Concerns  related to the management of caregiver strain as evidenced by review of EMR and patient or social worker report     through collaboration with Consulting civil engineer, provider, and care team.  ? ?Interventions: ?1:1 collaboration with primary care provider regarding development and update of comprehensive plan of care as evidenced by provider attestation and co-signature ?Inter-disciplinary care team collaboration (see longitudinal plan of care) ?Evaluation of current treatment plan related to  self management and patient's adherence to plan as established by provider ?Assessed mobility and ability to perform ADLs ?Patient walks or goes to the gym daily ?Discussed family/social support ?Tries to assist husband with medical conditions. Husband is often noncompliant and this is a stressor for her. She is talking with LCSW regarding caregiver strain ?Adult nephew lives with her currently and is available for assistance if needed ?Encouraged patient to take time for herself. Recommended daily walks/exercise. She enjoys going to the park but is sometimes afraid to go. Recommended pepper spray for  personal protection. Patient will consider this.  ? ? ?Diabetes:  (Status: New goal.) Long Term Goal  ? ?Lab Results  ?Component Value Date  ? HGBA1C 5.3 08/15/2021  ? HGBA1C 6.2 12/13/2020  ? HGBA1C 5.9 05/24/2020  ? ?Lab Results   ?Component Value Date  ? MICROALBUR <0.7 12/30/2018  ? [place] 33 08/15/2021  ? CREATININE 1.09 08/15/2021  ?Assessed patient's understanding of A1c goal: <7% ?Provided education to patient about basic DM disease process; ?Reviewed medications with patient and discussed importance of medication adherence;        ?Reviewed prescribed diet with patient Carb Modified ADA Diet; ?Counseled on importance of regular laboratory monitoring as prescribed;        ?Discussed plans with patient for ongoing care management follow up and provided patient with direct contact information for care management team;      ?Advised patient, providing education and rationale, to check cbg once daily and when you have symptoms of low or high blood sugar and record        ?call provider for findings outside established parameters;       ?Review of patient status, including review of consultants reports, relevant laboratory and other test results, and medications completed;       ?Assessed social determinant of health barriers;        ?Discussed cost of Trulicity. Affordable at present. May be unaffordable if she hits the Medicare Coverage Gap. Provided verbal education on prescription assistance plan through manufacturer. Currently they are not offering assistance for Trulicity due to shortage but may resume in the future. Can check on availability if/when needed. ?Discussed importance of yearly eye exams to assess for retinopathy. Has had an eye exam within the past year. ? ? ?Hypertension: (Status: New goal.) Long Term Goal  ?Last practice recorded BP readings:  ?BP Readings from Last 3 Encounters:  ?08/15/21 130/72  ?08/10/21 (!) 150/82  ?07/13/21 133/85  ?Most recent eGFR/CrCl: No results found for: EGFR  No components found for: CRCL ? ?Evaluation of current treatment plan related to hypertension self management and patient's adherence to plan as established by provider;   ?Reviewed medications with patient and discussed importance of  compliance;  ?Counseled on the importance of exercise goals with target of 150 minutes per week ?Discussed plans with patient for ongoing care management follow up and provided patient with direct contact information for care management team; ?Advised patient, providing education and rationale, to monitor blood pressure daily and record, calling PCP for findings outside established parameters;  ?Provided education on prescribed diet Low sodium DASH diet;  ?Discussed complications of poorly controlled blood pressure such as heart disease, stroke, circulatory complications, vision complications, kidney impairment, sexual dysfunction;  ?Assessed social determinant of health barriers;  ?Discussed home blood pressure readings. Averaging around 120/70. ? ? ?Patient Goals/Self-Care Activities: ?Take medications as prescribed   ?Perform all self care activities independently  ?Perform IADL's (shopping, preparing meals, housekeeping, managing finances) independently ?Call provider office for new concerns or questions  ?Work with the Education officer, museum to address care coordination needs and will continue to work with the clinical team to address health care and disease management related needs ?check blood sugar at prescribed times: once daily and when you have symptoms of low or high blood sugar ?take the blood sugar log to all doctor visits ?wear comfortable, cotton socks ?wear comfortable, well-fitting shoes ?check blood pressure daily ?write blood pressure results in a log or diary ?take blood pressure log to all doctor  appointments ?report new symptoms to your doctor ?Continue to exercise at least 150 minutes per week ?Take time for yourself and do something that you enjoy everyday ? ? ?  ? ? ?Patient verbalizes understanding of instructions and care plan provided today and agrees to view in [place]. Active MyChart status confirmed with patient.   ? ? ?Plan: The patient has been provided with contact information for the care  management team and has been advised to call with any health related questions or concerns.  ?The care management team will reach out to the patient again over the next 30 days. ? ?Chong Sicilian, BSN,

## 2021-09-21 NOTE — Chronic Care Management (AMB) (Signed)
? Care Management ?  ? RN Visit Note ? ?09/16/2021 ?Name: Katie [name] MRN: [id number] DOB: February 05, 1946 ? ?Subjective: ?Katie [name] is a 76 y.o. year old female who is a primary care patient of Katie Lima, MD. The care management team was consulted for assistance with disease management and care coordination needs.   ? ?Engaged with patient by telephone for initial visit in response to provider referral for case management and/or care coordination services.  ? ?Consent to Services:  ? Katie [name] was given information about Care Management services today including:  ?Care Management services includes personalized support from designated clinical staff supervised by her physician, including individualized plan of care and coordination with other care providers ?24/7 contact phone numbers for assistance for urgent and routine care needs. ?The patient may stop case management services at any time by phone call to the office staff. ? ?Patient agreed to services and consent obtained.  ? ?Assessment: Review of patient past medical history, allergies, medications, health status, including review of consultants reports, laboratory and other test data, was performed as part of comprehensive evaluation and provision of chronic care management services.  ? ?SDOH (Social Determinants of Health) assessments and interventions performed:   ? ?Care Plan ? ?Allergies  ?Allergen Reactions  ? Lisinopril Other (See Comments) and Cough  ?  Cough and throat clearing  ? Metoprolol Succinate Other (See Comments)  ?  Unknown reaction  ? Penicillins Swelling  ?  Has patient had a PCN reaction causing immediate rash, facial/tongue/throat swelling, SOB or lightheadedness with hypotension: Yes ?Has patient had a PCN reaction causing severe rash involving mucus membranes or skin necrosis: No ?Has patient had a PCN reaction that required hospitalization: No ?Has patient had a PCN reaction occurring within the last 10 years: No ?If all of the above  answers are "NO", then may proceed with Cephalosporin use. ?  ? Sulfamethoxazole Other (See Comments)  ?  Unknown reaction  ? Sulfamethoxazole-Trimethoprim Other (See Comments)  ?  Unknown reaction  ? Trimethoprim Other (See Comments)  ?  Unknown reaction  ? Metformin Nausea Only  ? ? ?Outpatient Encounter Medications as of 09/16/2021  ?Medication Sig  ? ACCU-CHEK AVIVA PLUS test strip   ? allopurinol (ZYLOPRIM) 100 MG tablet Take 1 tablet (100 mg total) by mouth daily.  ? ALPRAZolam (XANAX) 0.5 MG tablet TAKE 1 TABLET BY MOUTH 3  TIMES DAILY AS NEEDED FOR  ANXIETY OR SLEEP  ? aspirin 81 MG chewable tablet Chew 81 mg by mouth every morning.  ? cyanocobalamin (,VITAMIN B-12,) 1000 MCG/ML injection INJECT 1 ML  ONCE EVERY MONTH  ? Digestive Enzymes (DIGESTIVE ENZYME PO) Take 2 capsules by mouth every morning.  ? Dulaglutide (TRULICITY) 1.5 GQ/6.7YP SOPN Inject 1.5 mg into the skin See admin instructions. Inject 0.5 mls (1.5 mg) subcutaneously every other Sunday  ? famotidine (PEPCID) 20 MG tablet Take 2 tablets by mouth once daily  ? hydrALAZINE (APRESOLINE) 25 MG tablet Take 1 tablet (25 mg total) by mouth 3 (three) times daily.  ? Lancet Devices (SIMPLE DIAGNOSTICS LANCING DEV) MISC   ? levothyroxine (SYNTHROID, LEVOTHROID) 88 MCG tablet Take 88 mcg by mouth daily before breakfast.  ? linaclotide (LINZESS) 290 MCG CAPS capsule Take 290 mcg by mouth as needed.  ? magnesium hydroxide (DULCOLAX) 400 MG/5ML suspension Take 15 mLs by mouth daily as needed for mild constipation.  ? Polyethyl Glycol-Propyl Glycol (SYSTANE) 0.4-0.3 % SOLN Place 1 drop into both eyes daily as needed (dry  eyes).  ? rosuvastatin (CRESTOR) 10 MG tablet Take 10 mg by mouth every morning.  ? vitamin E 180 MG (400 UNITS) capsule Take 400 Units by mouth every morning.  ? ?No facility-administered encounter medications on file as of 09/16/2021.  ? ? ?Patient Active Problem List  ? Diagnosis Date Noted  ? Diuretic-induced hypokalemia 12/16/2020  ?  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 MGUS (monoclonal gammopathy of unknown significance) 09/08/2020  ? Visit for screening mammogram 05/26/2020  ? Encounter for general adult medical examination with abnormal findings 05/24/2020  ? Chronic laryngitis 05/24/2020  ? Abnormal electrocardiogram (ECG) (EKG) 05/24/2020  ? Intestinal metaplasia of gastric mucosa 09/16/2019  ? 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 PAD (peripheral artery disease) ([place]) 12/30/2018  ? Calculus of gallbladder with chronic cholecystitis without obstruction 02/06/2018  ? Chronic renal disease, stage 3, moderately decreased glomerular filtration rate (GFR) between 30-59 mL/min/1.73 square meter (HCC) 01/24/2018  ? Primary osteoarthritis of right hip 07/31/2017  ? Obesity, Class I, BMI 30.0-34.9 (see actual BMI) 09/20/2016  ? Spinal stenosis, lumbar region with neurogenic claudication 06/28/2016  ? Prediabetes 11/27/2012  ? Vitamin D deficiency 03/05/2008  ? Hyperlipidemia with target LDL less than 100 11/16/2007  ? Hypothyroidism 04/03/2007  ? B12 deficiency 04/03/2007  ? GAD (generalized anxiety disorder) 04/03/2007  ? PSEUDOTUMOR CEREBRI 04/03/2007  ? IRITIS 04/03/2007  ? Essential hypertension 04/03/2007  ? Venous (peripheral) insufficiency 04/03/2007  ? Reflux esophagitis 04/03/2007  ? Constipation 04/03/2007  ? [place] DISEASE, CERVICAL SPINE 04/03/2007  ? ? ?Conditions to be addressed/monitored: HTN and DMII ? ?Care Plan : RNCM Care Plan  ?  ? ?Problem: Chronic Disease Management Needs   ?Priority: High  ?Onset Date: 09/16/2021  ?  ? ?Long-Range Goal: Patient will Work with Consulting civil engineer to Develop a Plan of [place] with HTN and Diabetes   ?Start Date: 09/16/2021  ?Expected End Date: 09/17/2022  ?This Visit's Progress: On track  ?Priority: High  ?Note:   ?Current Barriers:  ?Chronic Disease Management support and  education needs related to HTN and DMII ?Caregiver strain ? ?RNCM Clinical Goal(s):  ?Patient will continue to work with RN Care Manager and/or Social Worker to address care management and care coordination needs related to HTN and DMII as evidenced by adherence to CM Team Scheduled appointments     ?work with Education officer, museum to address [place] Concerns  related to the management of caregiver strain as evidenced by review of EMR and patient or social worker report     through collaboration with Consulting civil engineer, provider, and care team.  ? ?Interventions: ?1:1 collaboration with primary care provider regarding development and update of comprehensive plan of care as evidenced by provider attestation and co-signature ?Inter-disciplinary care team collaboration (see longitudinal plan of care) ?Evaluation of current treatment plan related to  self management and patient's adherence to plan as established by provider ?Assessed mobility and ability to perform ADLs ?Patient walks or goes to the gym daily ?Discussed family/social support ?Tries to assist husband with medical conditions. Husband is often noncompliant and this is a stressor for her. She is talking with LCSW regarding caregiver strain ?Adult nephew lives with her currently and is available for assistance if needed ?Encouraged patient to take time for herself. Recommended daily walks/exercise. She enjoys going to the park but is sometimes afraid to go. Recommended pepper spray for personal protection.  Patient will consider this.  ? ? ?Diabetes:  (Status: New goal.) Long Term Goal  ? ?Lab Results  ?Component Value Date  ? HGBA1C 5.3 08/15/2021  ? HGBA1C 6.2 12/13/2020  ? HGBA1C 5.9 05/24/2020  ? ?Lab Results  ?Component Value Date  ? MICROALBUR <0.7 12/30/2018  ? [place] 33 08/15/2021  ? CREATININE 1.09 08/15/2021  ?Assessed patient's understanding of A1c goal: <7% ?Provided education to patient about basic DM disease process; ?Reviewed medications with  patient and discussed importance of medication adherence;        ?Reviewed prescribed diet with patient Carb Modified ADA Diet; ?Counseled on importance of regular laboratory monitoring as prescribed;        ?Discuss

## 2021-09-28 ENCOUNTER — Other Ambulatory Visit: Payer: Self-pay

## 2021-09-28 ENCOUNTER — Inpatient Hospital Stay: Payer: Medicare Other | Attending: Hematology and Oncology

## 2021-09-28 DIAGNOSIS — D649 Anemia, unspecified: Secondary | ICD-10-CM | POA: Insufficient documentation

## 2021-09-28 DIAGNOSIS — Z86718 Personal history of other venous thrombosis and embolism: Secondary | ICD-10-CM | POA: Insufficient documentation

## 2021-09-28 DIAGNOSIS — D472 Monoclonal gammopathy: Secondary | ICD-10-CM | POA: Insufficient documentation

## 2021-09-28 DIAGNOSIS — N189 Chronic kidney disease, unspecified: Secondary | ICD-10-CM | POA: Insufficient documentation

## 2021-09-28 DIAGNOSIS — I129 Hypertensive chronic kidney disease with stage 1 through stage 4 chronic kidney disease, or unspecified chronic kidney disease: Secondary | ICD-10-CM | POA: Insufficient documentation

## 2021-09-28 DIAGNOSIS — Z79899 Other long term (current) drug therapy: Secondary | ICD-10-CM | POA: Insufficient documentation

## 2021-09-28 DIAGNOSIS — E1122 Type 2 diabetes mellitus with diabetic chronic kidney disease: Secondary | ICD-10-CM | POA: Insufficient documentation

## 2021-09-28 LAB — CMP (CANCER CENTER ONLY)
ALT: 18 U/L (ref 0–44)
AST: 23 U/L (ref 15–41)
Albumin: 3.9 g/dL (ref 3.5–5.0)
Alkaline Phosphatase: 56 U/L (ref 38–126)
Anion gap: 6 (ref 5–15)
BUN: 14 mg/dL (ref 8–23)
CO2: 29 mmol/L (ref 22–32)
Calcium: 9.8 mg/dL (ref 8.9–10.3)
Chloride: 105 mmol/L (ref 98–111)
Creatinine: 1.09 mg/dL — ABNORMAL HIGH (ref 0.44–1.00)
GFR, Estimated: 53 mL/min — ABNORMAL LOW (ref >60)
Glucose, Bld: 124 mg/dL — ABNORMAL HIGH (ref 70–99)
Potassium: 4.1 mmol/L (ref 3.5–5.1)
Sodium: 140 mmol/L (ref 135–145)
Total Bilirubin: 1.4 mg/dL — ABNORMAL HIGH (ref 0.3–1.2)
Total Protein: 7 g/dL (ref 6.5–8.1)

## 2021-09-28 LAB — CBC WITH DIFFERENTIAL/PLATELET
Abs Immature Granulocytes: 0.01 10*3/uL (ref 0.00–0.07)
Basophils Absolute: 0 10*3/uL (ref 0.0–0.1)
Basophils Relative: 1 %
Eosinophils Absolute: 0.1 10*3/uL (ref 0.0–0.5)
Eosinophils Relative: 3 %
HCT: 40.4 % (ref 36.0–46.0)
Hemoglobin: 13.7 g/dL (ref 12.0–15.0)
Immature Granulocytes: 0 %
Lymphocytes Relative: 33 %
Lymphs Abs: 1.3 10*3/uL (ref 0.7–4.0)
MCH: 28.5 pg (ref 26.0–34.0)
MCHC: 33.9 g/dL (ref 30.0–36.0)
MCV: 84 fL (ref 80.0–100.0)
Monocytes Absolute: 0.4 10*3/uL (ref 0.1–1.0)
Monocytes Relative: 10 %
Neutro Abs: 2.1 10*3/uL (ref 1.7–7.7)
Neutrophils Relative %: 53 %
Platelets: 174 10*3/uL (ref 150–400)
RBC: 4.81 MIL/uL (ref 3.87–5.11)
RDW: 13.4 % (ref 11.5–15.5)
WBC: 3.9 10*3/uL — ABNORMAL LOW (ref 4.0–10.5)
nRBC: 0 % (ref 0.0–0.2)

## 2021-09-29 LAB — KAPPA/LAMBDA LIGHT CHAINS
Kappa free light chain: 32.8 mg/L — ABNORMAL HIGH (ref 3.3–19.4)
Kappa, lambda light chain ratio: 0.4 (ref 0.26–1.65)
Lambda free light chains: 81.7 mg/L — ABNORMAL HIGH (ref 5.7–26.3)

## 2021-09-30 LAB — PROTEIN ELECTROPHORESIS, SERUM, WITH REFLEX
A/G Ratio: 1.2 (ref 0.7–1.7)
Albumin ELP: 3.7 g/dL (ref 2.9–4.4)
Alpha-1-Globulin: 0.2 g/dL (ref 0.0–0.4)
Alpha-2-Globulin: 0.7 g/dL (ref 0.4–1.0)
Beta Globulin: 1 g/dL (ref 0.7–1.3)
Gamma Globulin: 1.3 g/dL (ref 0.4–1.8)
Globulin, Total: 3.2 g/dL (ref 2.2–3.9)
M-Spike, %: 0.6 g/dL — ABNORMAL HIGH
SPEP Interpretation: 0
Total Protein ELP: 6.9 g/dL (ref 6.0–8.5)

## 2021-09-30 LAB — IMMUNOFIXATION REFLEX, SERUM
IgA: 225 mg/dL (ref 64–422)
IgG (Immunoglobin G), Serum: 1270 mg/dL (ref 586–1602)
IgM (Immunoglobulin M), Srm: 164 mg/dL (ref 26–217)

## 2021-10-05 ENCOUNTER — Other Ambulatory Visit: Payer: Self-pay

## 2021-10-05 ENCOUNTER — Encounter: Payer: Self-pay | Admitting: Hematology and Oncology

## 2021-10-05 ENCOUNTER — Inpatient Hospital Stay: Payer: Medicare Other | Admitting: Hematology and Oncology

## 2021-10-05 ENCOUNTER — Other Ambulatory Visit: Payer: Self-pay | Admitting: *Deleted

## 2021-10-05 ENCOUNTER — Ambulatory Visit: Payer: Medicare Other | Admitting: *Deleted

## 2021-10-05 DIAGNOSIS — R7303 Prediabetes: Secondary | ICD-10-CM

## 2021-10-05 DIAGNOSIS — D472 Monoclonal gammopathy: Secondary | ICD-10-CM

## 2021-10-05 DIAGNOSIS — I10 Essential (primary) hypertension: Secondary | ICD-10-CM

## 2021-10-05 NOTE — Progress Notes (Signed)
[place] NOTE  Patient Care Team: Janith Lima, MD as PCP - General (Internal Medicine) Altheimer, Legrand Como, MD (Endocrinology) Ladene Artist, MD (Gastroenterology) Rosemary Holms, [place] (Podiatry) Rutherford Guys, MD (Ophthalmology) Carren Rang, Nadara Mustard, MD (Gynecology) Lavonna Monarch, MD as Consulting Physician (Dermatology) Maurine Cane, LCSW as Social Worker (Licensed Clinical Social Worker) Franchot Gallo, Lenon Oms, RN as [place] Management  CHIEF COMPLAINTS/PURPOSE OF CONSULTATION:  MGUS  ASSESSMENT & PLAN:   MGUS (monoclonal gammopathy of unknown significance) This is a very pleasant 76 year old female patient with past medical history significant for CKD who has recently seen her nephrologist because of worsening chronic kidney disease had some lab evaluation and was found to have monoclonal gammopathy on her labs and hence referred to hematology for further recommendations.   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 Bone marrow biopsy demonstrated small clone of plasma cells measuring about 3 to 5%, normal FISH testing, normal cytogenetics. 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 She denies any new symptoms. PE unremarkable today Labs from today stable no concern for worsening monoclonal protein. Given low risk MGUS, we have recommended follow up with labs annually. RTC in one year with labs.  No orders of the defined types were placed in this encounter.  Thank you for consulting [name] in the care of this patient.  Please not hesitate to contact us with any additional questions or concerns.  HISTORY OF PRESENTING ILLNESS:   Katie [name] 76 y.o. female is here because of MGUS  Interval History  [name] is here for follow-up today.  She denies any new complaints. No B symptoms. 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 No other new bone pains. She says her BP is not well controlled and she was started on a new pill, hydralazine. No change in breathing, bowel habits or urinary habits. Sleep is not good, having a lot of things going on her mind.  Rest of the pertinent 10 point ROS reviewed and negative.  MEDICAL HISTORY:  Past Medical History:  Diagnosis Date   Acute venous embolism and thrombosis of unspecified deep vessels of lower extremity 2001   RLE DVT and PE, coumadin x 69mo now ASSA   Anxiety state, unspecified    Chronic idiopathic constipation 11/02/2016   Degeneration of cervical intervertebral disc    Gallstones    History of DVT (deep vein thrombosis)    Iron deficiency anemia secondary to blood loss (chronic)    Irritable bowel syndrome    Lumbago    Other B-complex deficiencies    Personal history of colonic polyps    adenomatous   Pure hypercholesterolemia    Reflux esophagitis    Tubular adenoma of colon 09/2002   Type II or unspecified type diabetes mellitus without mention of complication, not stated as uncontrolled    Unspecified essential hypertension    Unspecified hypothyroidism    Unspecified iridocyclitis    Unspecified venous (peripheral) insufficiency    Unspecified vitamin D deficiency     SURGICAL HISTORY: Past Surgical History:  Procedure Laterality Date   BIOPSY  04/24/2018   Procedure: BIOPSY;  Surgeon: MIrving Copas, MD;  Location: [facility];  Service: Gastroenterology;;   CARPAL TUNNEL RELEASE     CATARACT EXTRACTION Bilateral 08/29/2017,09/19/17   ESOPHAGOGASTRODUODENOSCOPY (EGD) WITH PROPOFOL N/A 04/24/2018   Procedure:  ESOPHAGOGASTRODUODENOSCOPY (EGD) WITH PROPOFOL;  Surgeon: Mansouraty, Telford Nab., MD;  Location: [facility];  Service: Gastroenterology;  Laterality: N/A;  with EMR   FEMORAL HERNIA REPAIR     OOPHORECTOMY     POLYPECTOMY  04/24/2018   Procedure: POLYPECTOMY;  Surgeon: Mansouraty, Telford Nab., MD;  Location: [facility];  Service: Gastroenterology;;   UPPER ESOPHAGEAL ENDOSCOPIC ULTRASOUND (EUS)  04/24/2018   Procedure: UPPER ESOPHAGEAL ENDOSCOPIC ULTRASOUND (EUS);  Surgeon: Rush Landmark Telford Nab., MD;  Location: [facility];  Service: Gastroenterology;;   VESICOVAGINAL FISTULA CLOSURE W/ TAH      SOCIAL HISTORY: Social History   Socioeconomic History   Marital status: Married    Spouse name: Abbrielle Batts   Number of children: 0   Years of education: masters   Highest education level: Not on file  Occupational History   Occupation: record Firefighter: [employer]  Tobacco Use   Smoking status: Never   Smokeless tobacco: Never  Vaping Use   Vaping Use: Never used  Substance and Sexual Activity   Alcohol use: Yes    Alcohol/week: 0.0 standard drinks    Comment: occasional-wine    Drug use: No   Sexual activity: Not Currently  Other Topics Concern   Not on file  Social History Narrative   Lives with husband in a 2 story home with a basement.  Has no children.     Retired from the police department.     Education: masters (guidance and counseling).    Social Determinants of Health   Financial Resource Strain: Low Risk    Difficulty of Paying Living Expenses: Not hard at all  Food Insecurity: No Food Insecurity   Worried About Charity fundraiser in the Last Year: Never true   [place] in the Last Year: Never true  Transportation Needs: No Transportation Needs   Lack of Transportation (Medical): No   Lack of Transportation (Non-Medical): No  Physical Activity: Inactive   Days of Exercise per Week: 0 days   Minutes of Exercise per Session: 0 min  Stress: Stress Concern Present   Feeling of Stress : Very much  Social Connections: Socially Integrated   Frequency of Communication with Friends and Family: More than three times a week   Frequency of Social Gatherings with Friends and Family: More than three times a week   Attends Religious Services: More than  4 times per year   Active Member of [employer] or Organizations: Not on file   Attends Music therapist: More than 4 times per year   Marital Status: Married  Human resources officer Violence: Not At Risk   Fear of Current or Ex-Partner: No   Emotionally Abused: No   Physically Abused: No   Sexually Abused: No    FAMILY HISTORY: Family History  Problem Relation Age of Onset   Prostate cancer Father    Diabetes Mother        retinopathy/blind   Deep vein thrombosis Mother    Hypertension Mother    Healthy Sister    Colon cancer Neg Hx    Esophageal cancer Neg Hx    Stomach cancer Neg Hx    Rectal cancer Neg Hx     ALLERGIES:  is allergic to lisinopril, metoprolol succinate, penicillins, sulfamethoxazole, sulfamethoxazole-trimethoprim, trimethoprim, and metformin.  MEDICATIONS:  Current Outpatient Medications  Medication Sig Dispense Refill   ACCU-CHEK AVIVA PLUS test strip      allopurinol (ZYLOPRIM) 100 MG tablet Take  1 tablet (100 mg total) by mouth daily. 90 tablet 2   ALPRAZolam (XANAX) 0.5 MG tablet TAKE 1 TABLET BY MOUTH 3  TIMES DAILY AS NEEDED FOR  ANXIETY OR SLEEP 270 tablet 0   aspirin 81 MG chewable tablet Chew 81 mg by mouth every morning.     cyanocobalamin (,VITAMIN B-12,) 1000 MCG/ML injection INJECT 1 ML  ONCE EVERY MONTH 3 mL 0   Digestive Enzymes (DIGESTIVE ENZYME PO) Take 2 capsules by mouth every morning.     Dulaglutide (TRULICITY) 1.5 CV/8.9FY SOPN Inject 1.5 mg into the skin See admin instructions. Inject 0.5 mls (1.5 mg) subcutaneously every other Sunday     famotidine (PEPCID) 20 MG tablet Take 2 tablets by mouth once daily 180 tablet 0   hydrALAZINE (APRESOLINE) 25 MG tablet Take 1 tablet (25 mg total) by mouth 3 (three) times daily. 270 tablet 3   Lancet Devices (SIMPLE DIAGNOSTICS LANCING DEV) MISC      levothyroxine (SYNTHROID, LEVOTHROID) 88 MCG tablet Take 88 mcg by mouth daily before breakfast.     linaclotide (LINZESS) 290 MCG CAPS capsule  Take 290 mcg by mouth as needed.     magnesium hydroxide (DULCOLAX) 400 MG/5ML suspension Take 15 mLs by mouth daily as needed for mild constipation.     Polyethyl Glycol-Propyl Glycol (SYSTANE) 0.4-0.3 % SOLN Place 1 drop into both eyes daily as needed (dry eyes).     rosuvastatin (CRESTOR) 10 MG tablet Take 10 mg by mouth every morning.     vitamin E 180 MG (400 UNITS) capsule Take 400 Units by mouth every morning.     No current facility-administered medications for this visit.   PHYSICAL EXAMINATION: ECOG PERFORMANCE STATUS: 0 - Asymptomatic  Vitals:   10/05/21 0923  BP: (!) 159/86  Pulse: (!) 104  Resp: 18  Temp: 97.9 F (36.6 C)  SpO2: 100%    Filed Weights   10/05/21 0923  Weight: 160 lb 1.6 oz (72.6 kg)    Physical Exam Constitutional:      Appearance: Normal appearance.  Cardiovascular:     Rate and Rhythm: Normal rate and regular rhythm.     Pulses: Normal pulses.     Heart sounds: Normal heart sounds.  Pulmonary:     Effort: Pulmonary effort is normal.     Breath sounds: Normal breath sounds.  Musculoskeletal:        General: No swelling or tenderness.     Cervical back: Normal range of motion and neck supple. No rigidity.  Lymphadenopathy:     Cervical: No cervical adenopathy.  Skin:    General: Skin is warm and dry.  Neurological:     General: No focal deficit present.     Mental Status: She is alert.  Psychiatric:        Mood and Affect: Mood normal.     LABORATORY DATA:  I have reviewed the data as listed Lab Results  Component Value Date   WBC 3.9 (L) 09/28/2021   HGB 13.7 09/28/2021   HCT 40.4 09/28/2021   MCV 84.0 09/28/2021   PLT 174 09/28/2021     Chemistry      Component Value Date/Time   NA 140 09/28/2021 1012   NA 141 01/15/2018 0000   K 4.1 09/28/2021 1012   CL 105 09/28/2021 1012   CO2 29 09/28/2021 1012   BUN 14 09/28/2021 1012   BUN 12 01/15/2018 0000   CREATININE 1.09 (H) 09/28/2021 1012   GLU 103  01/15/2018 0000       Component Value Date/Time   CALCIUM 9.8 09/28/2021 1012   ALKPHOS 56 09/28/2021 1012   AST 23 09/28/2021 1012   ALT 18 09/28/2021 1012   BILITOT 1.4 (H) 09/28/2021 1012      SURGICAL PATHOLOGY   CASE: [id number]  PATIENT: Katie [name]  Bone Marrow Report   Reason for Addendum #1:  Molecular Genetic Test Results, FISH  Reason for Addendum #2:  Cytogenetics results   Clinical History: MGUS, left posterior iliac, (ADC)      DIAGNOSIS:   BONE MARROW, ASPIRATE, CLOT, CORE:  -  Normocellular bone marrow with trilineage hematopoiesis and small  lambda-restricted plasma cell population  -  See comment   PERIPHERAL BLOOD:  -  Normocytic anemia  -  See CBC data    COMMENT:   CD138 immunohistochemistry on the clot and core biopsy highlights a mild  increase in plasma cells (3-5%).  Kappa/lambda light chain in situ  hybridization highlights a patchy expansion of lambda-restricted plasma  cells with aberrant CD56 expression by immunohistochemistry in a  background of polytypic plasma cells.  Correlation with clinical  findings, radiographic studies, other laboratory data, and  cytogenetics/FISH results is recommended  FISH results normal, no evidence of abnormalities  Cytogenetics normal.  Labs from 09/28/2021 showed a white blood cell count of 3.9, hemoglobin of 13.7 hematocrit of 40.4 and platelet count of 174,000  SPEP from 09/28/2021 with M spike of 0.6 g/dL.  Immunofixation showed IgG monoclonal protein with lambda light chain specificity Kappa lambda ratio normal at 0.40  CMP showed no evidence of hypercalcemia or acute kidney injury or elevated total protein  RADIOGRAPHIC STUDIES: I have personally reviewed the radiological images as listed and agreed with the findings in the report.  Total time spent: 20 minutes including history, physical exam, review of records, counseling or coordination of care  No results found.    Benay Pike, MD 10/05/2021 9:39  AM

## 2021-10-05 NOTE — Assessment & Plan Note (Signed)
This is a very pleasant 76-year-old female patient with past medical history significant for CKD who has recently seen her nephrologist because of worsening chronic kidney disease had some lab evaluation and was found to have monoclonal gammopathy on her labs and hence referred to hematology for further recommendations.   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 Bone marrow biopsy demonstrated small clone of plasma cells measuring about 3 to 5%, normal FISH testing, normal cytogenetics. 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 She denies any new symptoms. PE unremarkable today Labs from today stable no concern for worsening monoclonal protein. Given low risk MGUS, we have recommended follow up with labs annually. RTC in one year with labs. 

## 2021-10-05 NOTE — Patient Instructions (Signed)
Visit [place], thank you for taking time to talk with me today. Please don't hesitate to contact me if I can be of assistance to you before our next scheduled telephone appointment  Below are the goals we discussed today:  Patient Self-Care Activities: Patient [place] will: Take medications as prescribed Attend all scheduled provider appointments Call pharmacy for medication refills Call provider office for new concerns or questions Continue to check fasting (first thing in the morning, before eating) blood sugars and daily blood pressures at home- the blood sugars and blood pressures we reviewed today are in good range Continue to follow heart healthy, low salt, low cholesterol, carbohydrate-modified, low sugar diet Keep up the great work preventing falls Continue to stay as active as possible Take time for yourself every day; continue working with the Clinical Social Worker at Dr. Ronnald Ramp' office to help you manage your stress and anxiety  Our next scheduled telephone follow up visit/ appointment is scheduled on: Friday, November 11, 2021 at am/  pm- This is a PHONE [place] appointment  If you need to cancel or re-schedule our visit, please call [phone] and our care guide team will be happy to assist you.   I look forward to hearing about your progress.   Oneta Rack, RN, BSN, [place] [phone]: direct office  If you are experiencing a Mental Health or [place] or need someone to talk to, please  call the Suicide and Crisis Lifeline: 988 call the [place] National Suicide Prevention Lifeline: 316 653 1405 or TTY: 579-061-7052 TTY 534-044-7919) to talk to a trained counselor call 1-800-273-TALK (toll free, 24 hour hotline) go to [facility] [address] [place] [phone]) call 911   The patient verbalized understanding of instructions, educational  materials, and care plan provided today and agreed to receive a mailed copy of patient instructions, educational materials, and care plan   Dehydration, Adult Dehydration is a condition in which there is not enough water or other fluids in the body. This happens when a person loses more fluids than he or she takes in. Important organs, such as the kidneys, brain, and heart, cannot function without a proper amount of fluids. Any loss of fluids from the body can lead to dehydration. Dehydration can be mild, moderate, or severe. It should be treated right away to prevent it from becoming severe. What are the causes? Dehydration may be caused by: Conditions that cause loss of water or other fluids, such as diarrhea, vomiting, or sweating or urinating a lot. Not drinking enough fluids, especially when you are ill or doing activities that require a lot of energy. Other illnesses and conditions, such as fever or infection. Certain medicines, such as medicines that remove excess fluid from the body (diuretics). Lack of safe drinking water. Not being able to get enough water and food. What increases the risk? The following factors may make you more likely to develop this condition: Having a long-term (chronic) illness that has not been treated properly, such as diabetes, heart disease, or kidney disease. Being 88 years of age or older. Having a disability. Living in a place that is high in altitude, where thinner, drier air causes more fluid loss. Doing exercises that put stress on your body for a long time (endurance sports). What are the signs or symptoms? Symptoms of dehydration depend on how severe it is. Mild or moderate dehydration Thirst. Dry lips or dry mouth.  Dizziness or light-headedness, especially when standing up from a seated position. Muscle cramps. Dark urine. Urine may be the color of tea. Less urine or tears produced than usual. Headache. Severe dehydration Changes in skin.  Your skin may be cold and clammy, blotchy, or pale. Your skin also may not return to normal after being lightly pinched and released. Little or no tears, urine, or sweat. Changes in vital signs, such as rapid breathing and low blood pressure. Your pulse may be weak or may be faster than 100 beats a minute when you are sitting still. Other changes, such as: Feeling very thirsty. Sunken eyes. Cold hands and feet. Confusion. Being very tired (lethargic) or having trouble waking from sleep. Short-term weight loss. Loss of consciousness. How is this diagnosed? This condition is diagnosed based on your symptoms and a physical exam. You may have blood and urine tests to help confirm the diagnosis. How is this treated? Treatment for this condition depends on how severe it is. Treatment should be started right away. Do not wait until dehydration becomes severe. Severe dehydration is an emergency and needs to be treated in a hospital. Mild or moderate dehydration can be treated at home. You may be asked to: Drink more fluids. Drink an oral rehydration solution (ORS). This drink helps restore proper amounts of fluids and salts and minerals in the blood (electrolytes). Severe dehydration can be treated: With IV fluids. By correcting abnormal levels of electrolytes. This is often done by giving electrolytes through a tube that is passed through your nose and into your stomach (nasogastric tube, or NG tube). By treating the underlying cause of dehydration. Follow these instructions at home: Oral rehydration solution If told by your health care provider, drink an ORS: Make an ORS by following instructions on the package. Start by drinking small amounts, about  cup (120 mL) every 5-10 minutes. Slowly increase how much you drink until you have taken the amount recommended by your health care provider. Eating and drinking        Drink enough clear fluid to keep your urine pale yellow. If you were  told to drink an ORS, finish the ORS first and then start slowly drinking other clear fluids. Drink fluids such as: Water. Do not drink only water. Doing that can lead to hyponatremia, which is having too little salt (sodium) in the body. Water from ice chips you suck on. Fruit juice that you have added water to (diluted fruit juice). Low-calorie sports drinks. Eat foods that contain a healthy balance of electrolytes, such as bananas, oranges, potatoes, tomatoes, and spinach. Do not drink alcohol. Avoid the following: Drinks that contain a lot of sugar. These include high-calorie sports drinks, fruit juice that is not diluted, and soda. Caffeine. Foods that are greasy or contain a lot of fat or sugar. General instructions Take over-the-counter and prescription medicines only as told by your health care provider. Do not take sodium tablets. Doing that can lead to having too much sodium in the body (hypernatremia). Return to your normal activities as told by your health care provider. Ask your health care provider what activities are safe for you. Keep all follow-up visits as told by your health care provider. This is important. Contact a health care provider if: You have muscle cramps, pain, or discomfort, such as: Pain in your abdomen and the pain gets worse or stays in one area (localizes). Stiff neck. You have a rash. You are more irritable than usual. You are sleepier or  have a harder time waking than usual. You feel weak or dizzy. You feel very thirsty. Get help right away if you have: Any symptoms of severe dehydration. Symptoms of vomiting, such as: You cannot eat or drink without vomiting. Vomiting gets worse or does not go away. Vomit includes blood or green matter (bile). Symptoms that get worse with treatment. A fever. A severe headache. Problems with urination or bowel movements, such as: Diarrhea that gets worse or does not go away. Blood in your stool (feces). This  may cause stool to look black and tarry. Not urinating, or urinating only a small amount of very dark urine, within 6-8 hours. Trouble breathing. These symptoms may represent a serious problem that is an emergency. Do not wait to see if the symptoms will go away. Get medical help right away. Call your local emergency services (911 in the [place] Do not drive yourself to the hospital. Summary Dehydration is a condition in which there is not enough water or other fluids in the body. This happens when a person loses more fluids than he or she takes in. Treatment for this condition depends on how severe it is. Treatment should be started right away. Do not wait until dehydration becomes severe. Drink enough clear fluid to keep your urine pale yellow. If you were told to drink an oral rehydration solution (ORS), finish the ORS first and then start slowly drinking other clear fluids. Take over-the-counter and prescription medicines only as told by your health care provider. Get help right away if you have any symptoms of severe dehydration. This information is not intended to replace advice given to you by your health care provider. Make sure you discuss any questions you have with your health care provider. Document Revised: 12/12/2018 Document Reviewed: 12/12/2018 Elsevier Patient Education  [place].

## 2021-10-06 ENCOUNTER — Other Ambulatory Visit: Payer: Self-pay | Admitting: Internal Medicine

## 2021-10-06 DIAGNOSIS — K2100 Gastro-esophageal reflux disease with esophagitis, without bleeding: Secondary | ICD-10-CM

## 2021-10-06 NOTE — Chronic Care Management (AMB) (Signed)
Care Management    RN Visit Note  10/06/2021 Name: Katie [name] MRN: [id number] DOB: 04/01/1946  Subjective: Katie [name] is a 76 y.o. year old female who is a primary care patient of Katie Lima, MD. The care management team was consulted for assistance with disease management and care coordination needs.    Engaged with patient by telephone for follow up visit in response to provider referral for case management and/or care coordination services.   Consent to Services:   Ms. Janvier was given information about Care Management services 08/16/21 including:  Care Management services includes personalized support from designated clinical staff supervised by her physician, including individualized plan of care and coordination with other care providers 24/7 contact phone numbers for assistance for urgent and routine care needs. The patient may stop case management services at any time by phone call to the office staff.  Patient agreed to services and consent obtained.   Assessment: Review of patient past medical history, allergies, medications, health status, including review of consultants reports, laboratory and other test data, was performed as part of comprehensive evaluation and provision of chronic care management services.   SDOH (Social Determinants of Health) assessments and interventions performed:  SDOH Interventions    Flowsheet Row Most Recent Value  SDOH Interventions   Food Insecurity Interventions Intervention Not Indicated  [denies food insecurity]  Housing Interventions Intervention Not Indicated  [denies housing concerns]  Transportation Interventions Intervention Not Indicated  [Patient drives self]     Care Plan  Allergies  Allergen Reactions   Lisinopril Other (See Comments) and Cough    Cough and throat clearing   Metoprolol Succinate Other (See Comments)    Unknown reaction   Penicillins Swelling    Has patient had a PCN reaction causing immediate rash,  facial/tongue/throat swelling, SOB or lightheadedness with hypotension: Yes Has patient had a PCN reaction causing severe rash involving mucus membranes or skin necrosis: No Has patient had a PCN reaction that required hospitalization: No Has patient had a PCN reaction occurring within the last 10 years: No If all of the above answers are "NO", then may proceed with Cephalosporin use.    Sulfamethoxazole Other (See Comments)    Unknown reaction   Sulfamethoxazole-Trimethoprim Other (See Comments)    Unknown reaction   Trimethoprim Other (See Comments)    Unknown reaction   Metformin Nausea Only   Outpatient Encounter Medications as of 10/05/2021  Medication Sig   ACCU-CHEK AVIVA PLUS test strip    allopurinol (ZYLOPRIM) 100 MG tablet Take 1 tablet (100 mg total) by mouth daily.   ALPRAZolam (XANAX) 0.5 MG tablet TAKE 1 TABLET BY MOUTH 3  TIMES DAILY AS NEEDED FOR  ANXIETY OR SLEEP   aspirin 81 MG chewable tablet Chew 81 mg by mouth every morning.   cyanocobalamin (,VITAMIN B-12,) 1000 MCG/ML injection INJECT 1 ML  ONCE EVERY MONTH   Digestive Enzymes (DIGESTIVE ENZYME PO) Take 2 capsules by mouth every morning.   Dulaglutide (TRULICITY) 1.5 LK/4.4WN SOPN Inject 1.5 mg into the skin See admin instructions. Inject 0.5 mls (1.5 mg) subcutaneously every other Sunday   famotidine (PEPCID) 20 MG tablet Take 2 tablets by mouth once daily   hydrALAZINE (APRESOLINE) 25 MG tablet Take 1 tablet (25 mg total) by mouth 3 (three) times daily.   Lancet Devices (SIMPLE DIAGNOSTICS LANCING DEV) MISC    levothyroxine (SYNTHROID, LEVOTHROID) 88 MCG tablet Take 88 mcg by mouth daily before breakfast.   linaclotide (LINZESS) 290 MCG  CAPS capsule Take 290 mcg by mouth as needed.   magnesium hydroxide (DULCOLAX) 400 MG/5ML suspension Take 15 mLs by mouth daily as needed for mild constipation.   Polyethyl Glycol-Propyl Glycol (SYSTANE) 0.4-0.3 % SOLN Place 1 drop into both eyes daily as needed (dry eyes).    rosuvastatin (CRESTOR) 10 MG tablet Take 10 mg by mouth every morning.   vitamin E 180 MG (400 UNITS) capsule Take 400 Units by mouth every morning.   No facility-administered encounter medications on file as of 10/05/2021.   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 12/13/2020   MGUS (monoclonal gammopathy of unknown significance) 09/08/2020   Visit for screening mammogram 05/26/2020   Encounter for general adult medical examination with abnormal findings 05/24/2020   Chronic laryngitis 05/24/2020   Abnormal electrocardiogram (ECG) (EKG) 05/24/2020   Intestinal metaplasia of gastric mucosa 09/16/2019   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   PAD (peripheral artery disease) ([place]) 12/30/2018   Calculus of gallbladder with chronic cholecystitis without obstruction 02/06/2018   Chronic renal disease, stage 3, moderately decreased glomerular filtration rate (GFR) between 30-59 mL/min/1.73 square meter ([place]) 01/24/2018   Primary osteoarthritis of right hip 07/31/2017   Obesity, Class I, BMI 30.0-34.9 (see actual BMI) 09/20/2016   Spinal stenosis, lumbar region with neurogenic claudication 06/28/2016   Prediabetes 11/27/2012   Vitamin D deficiency 03/05/2008   Hyperlipidemia with target LDL less than 100 11/16/2007   Hypothyroidism 04/03/2007   B12 deficiency 04/03/2007   GAD (generalized anxiety disorder) 04/03/2007   PSEUDOTUMOR CEREBRI 04/03/2007   IRITIS 04/03/2007   Essential hypertension 04/03/2007   Venous (peripheral) insufficiency 04/03/2007   Reflux esophagitis 04/03/2007   Constipation 04/03/2007   DEGENERATIVE DISC DISEASE, CERVICAL SPINE 04/03/2007   Conditions to be addressed/monitored: HTN and pre-DMII  Care Plan : [facility] Care Plan  Updates made by Knox Royalty, RN since 10/06/2021 12:00 AM     Problem: Chronic Disease Management  Needs   Priority: High  Onset Date: 09/16/2021     Long-Range Goal: Ongoing adherence to established plan of care for long term chronic disease management   Start Date: 09/16/2021  Expected End Date: 09/17/2022  Recent Progress: On track  Priority: High  Note:   Current Barriers:  Chronic Disease Management support and education needs related to HTN and DMII Caregiver strain around caring for her husband - currently working with Clinic CSW and anxiety management and coping skills 10/05/21- reports has received the forms for [facility] referral previously made by CSW; has not yet mailed- has questions for CSW; states their last conversation was "cut short" due to her husband incurring a fall during the conversation: she is requesting another CSW telephone appointment to follow up on [facility] referral/ questions: I will make scheduling care guide and CSW aware and request facilitation of scheduling with scheduler for next CSW telephone visit  [place]):  Patient will continue to work with RN Care Manager and/or Social Worker to address care management and care coordination needs related to HTN and DMII as evidenced by adherence to CM Team Scheduled appointments     work with Education officer, museum to address [place] Concerns  related to the management of caregiver strain as evidenced by review of EMR and patient or social worker report     through collaboration with Consulting civil engineer, provider, and care team.   Interventions:  1:1 collaboration with primary care provider regarding development and update of comprehensive plan of care as evidenced by provider attestation and co-signature Inter-disciplinary care team collaboration (see longitudinal plan of care) Evaluation of current treatment plan related to  self management and patient's adherence to plan as established by provider Review of patient status, including review of consultants reports, relevant laboratory and other test results, and medications  completed SDOH updated: no new/ unmet concerns identified Pain assessment completed: denies pain today Falls assessment completed: denies new/ recent falls x 12 months- not currently using assistive devices;  positive reinforcement provided with encouragement to continue efforts at fall prevention; previously provided education around fall risks/ prevention reinforced Medications discussed: independently self-manage and denies current concerns/ issues/ questions around medications; endorses adherence to taking all medications as prescribed Reviewed today's oncology provider appointment: she verbalizes a good understanding of post-visit instructions and denies questions; reports "got a very good report;" and will follow up for surveillance in one year Reviewed upcoming scheduled provider appointments: 11/10/21- cardiology provider; 02/14/22- PCP; patient confirms is aware of all and has plans to attend as scheduled Discussed plans with patient for ongoing care management follow up and provided patient with direct contact information for care management team     Diabetes:  (Status: 10/05/21: Goal on Track (progressing): YES.) Long Term Goal   Lab Results  Component Value Date   HGBA1C 5.3 08/15/2021   HGBA1C 6.2 12/13/2020   HGBA1C 5.9 05/24/2020   Lab Results  Component Value Date   MICROALBUR <0.7 12/30/2018   [place] 33 08/15/2021   CREATININE 1.09 08/15/2021  Provided education to patient about basic DM disease process; Reviewed medications with patient and discussed importance of medication adherence;        Reviewed prescribed diet with patient Carb Modified; low sugar ADA and heart healthy low salt Diet; Counseled on importance of regular laboratory monitoring as prescribed;        Confirmed patient continues to monitor and record daily fasting blood sugars a home- reviewed with patient: she reports consistent ranges between 90-110 Assessed patient's understanding of meaning/  significance of A1-C values: good baseline understanding of same- Reviewed individual historical A1-C trends and provided education around correlation of A1-C value to blood sugar levels at home over 3 months Confirmed currently not taking trulicity q week- reports she is taking "about every 3 weeks;" she reports this is due to "cost" and "availability of Trulicity- declines need for patient assistance; states she has been taking this way for "a long time;" and "Dr. Ronnald Ramp is aware: given her A1-C is WNL, this sounds reasonable Benefits of activity in setting of pre-DM and HTN discussed: patient is aware and is usually very active at baseline, however, her vehicle has been in repair and she has not been exercising as much since then- verbalizes plans to resume activity "as soon as possible," and this was encouraged-- she notes that staying active significantly helps in managing her anxiety and helps her with coping in general  Hypertension: (Status: 10/05/21: Goal on Track (progressing): YES.) Long Term Goal  Last practice recorded BP readings:  BP Readings from Last 3 Encounters:  08/15/21 130/72  08/10/21 (!) 150/82  07/13/21 133/85  Most recent eGFR/CrCl: No results found for: EGFR  No components found for: CRCL  Evaluation of current treatment plan related to hypertension self management and patient's adherence to plan as established by provider;   Counseled on the importance of exercise goals with target of 150 minutes per  week Advised patient, providing education and rationale, to monitor blood pressure daily and record, calling PCP for findings outside established parameters;  Discussed complications of poorly controlled blood pressure such as heart disease, stroke, circulatory complications, vision complications, kidney impairment, sexual dysfunction;  Confirmed patient continues monitoring and writing down on paper daily blood pressures at home: she reports consistent values between 120-132/  70-82 Confirmed currently taking hydralazine tid as prescribed for blood pressure- she has upcoming cardiology provider appointment; she verbalizes plans to discuss if this medication has any "bad effects" on her kidney function, which she wishes to preserve-- we reviewed most recent labs which seem overall stable, and I encouraged her to discuss with cardiology provider as she plans to She tells me that her renal provider has recommended that she get plenty of water each day to preserve kidney function: she asks about how to know she is getting enough and not becoming dehydrated- education provided; will provide additional printed educational material as well; reports she has annual visits to renal provider Confirmed patient continues taking statin and ASA as prescribed Confirmed patient tries to follow heart healthy los salt diet  Patient Goals/Self-Care Activities: As evidenced by review of EHR, collaboration with care team, and patient reporting during CCM RN CM outreach,  Patient Haydan will: Take medications as prescribed Attend all scheduled provider appointments Call pharmacy for medication refills Call provider office for new concerns or questions Continue to check fasting (first thing in the morning, before eating) blood sugars and daily blood pressures at home- the blood sugars and blood pressures we reviewed today are in good range Continue to follow heart healthy, low salt, low cholesterol, carbohydrate-modified, low sugar diet Keep up the great work preventing falls Continue to stay as active as possible Take time for yourself every day; continue working with the Clinical Social Worker at Dr. Ronnald Ramp' office to help you manage your stress and anxiety    Plan:  Telephone follow up appointment with care management team member scheduled for: Friday, November 11, 2021 at 2:15 pm The patient has been provided with contact information for the care management team and has been advised to call  with any health related questions or concerns  Oneta Rack, RN, BSN, [place] [phone]: direct office

## 2021-10-07 LAB — UPEP/TP, 24-HR URINE
Albumin, U: 30.5 %
Alpha 1, Urine: 5.8 %
Alpha 2, Urine: 8.7 %
Beta, Urine: 38.8 %
Gamma Globulin, Urine: 16.3 %
Total Protein, Urine-Ur/day: 86 mg/24 hr (ref 30–150)
Total Protein, Urine: 7.8 mg/dL
Total Volume: 1100

## 2021-10-07 NOTE — Progress Notes (Signed)
Scheduled 10/11/21  [facility]  Direct Dial: [phone]

## 2021-10-11 ENCOUNTER — Ambulatory Visit: Payer: Medicare Other | Admitting: Licensed Clinical Social Worker

## 2021-10-11 DIAGNOSIS — F411 Generalized anxiety disorder: Secondary | ICD-10-CM

## 2021-10-11 DIAGNOSIS — F439 Reaction to severe stress, unspecified: Secondary | ICD-10-CM

## 2021-10-11 NOTE — Chronic Care Management (AMB) (Signed)
Care Management Clinical Social Work Note  10/11/2021 Name: [name] MRN: [id number] DOB: [birth year]  Katie [name] is a 76 y.o. year old female who is a primary care patient of Janith Lima, MD.  The Care Management team was consulted for assistance with chronic disease management and coordination needs.  Engaged with patient by telephone for follow up visit in response to provider referral for social work chronic care management and care coordination services  Consent to Services:  [name] was given information about Care Management services today including:  Care Management services includes personalized support from designated clinical staff supervised by her physician, including individualized plan of care and coordination with other care providers 24/7 contact phone numbers for assistance for urgent and routine care needs. The patient may stop case management services at any time by phone call to the office staff.  Patient agreed to services and consent obtained.   Summary:  Patient has not connect for therapy, due to not completing paperwork that was mailed to her . LCSW discussed other therapy options that would get patient in sooner. See Care Plan below for interventions and patient self-care actives.  Recommendation: Patient may benefit from, and is in agreement to explore other therapy options.  She will review web site and call. She declined assistance from LCSW to assist with scheduling an appointment.   Follow up Plan:  Patient does not desire continued follow-up by CCM LCSW. Will contact the office if needed CCM LCSW will continue to collaborate with CCM RN in order to meet patient's needs .   Assessment: Review of patient past medical history, allergies, medications, and health status, including review of relevant consultants reports was performed today as part of a comprehensive evaluation and provision of chronic care management and care coordination services.  SDOH  (Social Determinants of Health) assessments and interventions performed:    Advanced Directives Status: Not addressed in this encounter.  Care Plan Conditions to be addressed/monitored: Anxiety and stress ;   Care Plan : LCSW Plan of Care  Updates made by Maurine Cane, LCSW since 10/11/2021 12:00 AM     Problem: Coping Skills      Goal: Coping Skills Enhanced   Start Date: 08/24/2021  This Visit's Progress: On track  Recent Progress: On track  Priority: High  Note:   Current Barriers:  Disease Management support and education needs related to symptoms of anxiety and stress   CSW Clinical Goal(s):  Patient  will demonstrate a reduction in symptoms related to :Anxiety  and stress through collaboration with Clinical Education officer, museum, provider, and care team.   Interventions: Inter-disciplinary care team collaboration (see longitudinal plan of care) Evaluation of current treatment plan related to  self management and patient's adherence to plan as established by provider  Mental Health:  (Status: Goal on Track (progressing): YES.) Evaluation of current treatment plan related to Stress at home and anxiety Mindfulness or Relaxation training provided Active listening / Reflection utilized  Emotional Support Provided Behavioral Activation reviewed Participation in counseling encouraged  Discussed other therapy options/ would like to continue with [place] Provided EMMI education information on Managing Anxiety Around Daily Tasks Discussed therapy options ( [facility] [phone])   Task & activities to accomplish goals: Practice relaxed breathing 3 times a day based on scheduled you decided on( 8:00; 11:00 and 6:00 Continue with exercise within your limites as discussed with your doctor. Review the web site [email or web address] and call [place]  Casimer Lanius, LCSW Licensed Clinical Social  Worker [name]  [place]  [phone]

## 2021-10-11 NOTE — Patient Instructions (Signed)
Visit Information  Thank you for taking time to visit with me today. Please don't hesitate to contact me if I can be of assistance to you before our next scheduled telephone appointment.  Following are the goals we discussed today: mental health support  Task & activities to accomplish goals: Practice relaxed breathing 3 times a day based on scheduled you decided on( 8:00; 11:00 and 6:00 Continue with exercise within your limites as discussed with your doctor. Review the web site [email or web address] and call [place] [phone]  Please call the office if needed No follow up scheduled, per our conversation you do not desire continued follow up I will wait for you to call me as per your instructions  Casimer Lanius, LCSW Licensed Clinical Social Worker [name]  [place]  [phone]   Please call the care guide team at [phone] if you need to cancel or reschedule your appointment.   If you are experiencing a Mental Health or [place] or need someone to talk to, please call the Suicide and Crisis Lifeline: 988 call 1-800-273-TALK (toll free, 24 hour hotline)   Patient verbalizes understanding of instructions and care plan provided today and agrees to view in [place]. Active MyChart status and patient understanding of how to access instructions and care plan via MyChart confirmed with patient.

## 2021-10-18 ENCOUNTER — Ambulatory Visit: Payer: Self-pay | Admitting: Licensed Clinical Social Worker

## 2021-10-18 DIAGNOSIS — F439 Reaction to severe stress, unspecified: Secondary | ICD-10-CM

## 2021-10-18 DIAGNOSIS — F411 Generalized anxiety disorder: Secondary | ICD-10-CM

## 2021-10-18 NOTE — Chronic Care Management (AMB) (Signed)
Care Management Clinical Social Work Note  10/18/2021 Name: Katie [name] MRN: [id number] DOB: 1945-08-11  Katie [name] is a 76 y.o. year old female who is a primary care patient of Janith Lima, MD.  The Care Management team was consulted for assistance with chronic disease management and coordination needs.  Engaged with patient by telephone for follow up visit in response to provider referral for social work chronic care management and care coordination services  Consent to Services:  Ms. Aung was given information about Care Management services today including:  Care Management services includes personalized support from designated clinical staff supervised by her physician, including individualized plan of care and coordination with other care providers 24/7 contact phone numbers for assistance for urgent and routine care needs. The patient may stop case management services at any time by phone call to the office staff.  Patient agreed to services and consent obtained.   Summary:  Patient contact [facility], reports she is unable to afford the $35.00 co-pay she will have to pay in order to seen, has decided not to move forward with therapy .  See Care Plan below for interventions and patient self-care actives.  Recommendation: Patient may benefit from, and is in agreement to work with LCSW on brief interventions to assist with managing symptoms of anxiety.   Follow up Plan: Patient would like continued follow-up from CCM LCSW.  per patient's request will follow up in 2 weeks.  Will call office if needed prior to next encounter.   Assessment: Review of patient past medical history, allergies, medications, and health status, including review of relevant consultants reports was performed today as part of a comprehensive evaluation and provision of chronic care management and care coordination services.  SDOH (Social Determinants of Health) assessments and interventions performed:     Advanced Directives Status: Not addressed in this encounter.  Care Plan Conditions to be addressed/monitored: Anxiety;   Care Plan : LCSW Plan of Care  Updates made by Maurine Cane, LCSW since 10/18/2021 12:00 AM     Problem: Coping Skills      Goal: Coping Skills Enhanced   Start Date: 08/24/2021  This Visit's Progress: On track  Recent Progress: On track  Priority: High  Note:   Current Barriers:  Disease Management support and education needs related to symptoms of anxiety and stress   CSW Clinical Goal(s):  Patient  will demonstrate a reduction in symptoms related to :Anxiety  and stress through collaboration with Clinical Education officer, museum, provider, and care team.   Interventions: Inter-disciplinary care team collaboration (see longitudinal plan of care) Evaluation of current treatment plan related to  self management and patient's adherence to plan as established by provider  Mental Health:  (Status: Goal on Track (progressing): YES.) Evaluation of current treatment plan related to Stress at home and anxiety Mindfulness or Relaxation training provided Active listening / Reflection utilized  Emotional Support Provided Behavioral Activation reviewed Participation in counseling encouraged  Discussed other therapy options/ would like to continue with [place] Provided EMMI education information on Managing Anxiety Around Daily Tasks Discussed therapy options ( [facility] [phone])   Task & activities to accomplish goals: Practice relaxed breathing 3 times a day based on scheduled you decided on( 8:00; 11:00 and 6:00 Continue with exercise within your limites as discussed with your doctor.    Casimer Lanius, LCSW Licensed Clinical Social Worker [name]  [place]  [phone]

## 2021-10-18 NOTE — Patient Instructions (Signed)
Visit Information  Thank you for taking time to visit with me today. Please don't hesitate to contact me if I can be of assistance to you before our next scheduled telephone appointment.  Following are the goals we discussed today: managing symptoms of anxiety Task & activities to accomplish goals: Practice relaxed breathing 3 times a day based on scheduled you decided on( 8:00; 11:00 and 6:00 Continue with exercise within your limites as discussed with your doctor.  Our next appointment is by telephone on June 19th at 1:15  Please call the care guide team at [phone] if you need to cancel or reschedule your appointment.   If you are experiencing a Mental Health or [place] or need someone to talk to, please call the Suicide and Crisis Lifeline: 988 call 1-800-273-TALK (toll free, 24 hour hotline) go to [facility] [address], [place] [phone])   Patient verbalizes understanding of instructions and care plan provided today and agrees to view in [place]. Active MyChart status and patient understanding of how to access instructions and care plan via MyChart confirmed with patient.     Casimer Lanius, LCSW Licensed Clinical Social Worker [name]  [place]  [phone]

## 2021-10-22 ENCOUNTER — Other Ambulatory Visit: Payer: Self-pay | Admitting: Internal Medicine

## 2021-10-22 DIAGNOSIS — F411 Generalized anxiety disorder: Secondary | ICD-10-CM

## 2021-10-25 ENCOUNTER — Other Ambulatory Visit: Payer: Self-pay | Admitting: Internal Medicine

## 2021-10-25 DIAGNOSIS — F411 Generalized anxiety disorder: Secondary | ICD-10-CM

## 2021-10-31 ENCOUNTER — Ambulatory Visit: Payer: Medicare Other | Admitting: Licensed Clinical Social Worker

## 2021-10-31 NOTE — Patient Instructions (Signed)
Visit Information  Thank you for taking time to visit with me today. Please don't hesitate to contact me if I can be of assistance to you before our next scheduled telephone appointment.  Following are the goals we discussed today:  Task & activities to accomplish goals: Continue with your self-care action plan (doing things your enjoy) Review your EMMI educational information (Relieving stress; Insomnia and getting a good night's sleep ) Look for an e-mail from [facility]. Practice relaxed breathing 3 times a day based on scheduled you decided on( 8:00; 11:00 and 6:00 Continue with exercise within your limites as discussed with your doctor when going to the gym.      Casimer Lanius, LCSW Licensed Clinical Social Worker [name]  [place]  [phone]    Our next appointment is by telephone on July 18th at 2:00  Please call the care guide team at [phone] if you need to cancel or reschedule your appointment.   If you are experiencing a Mental Health or [place] or need someone to talk to, please call 1-800-273-TALK (toll free, 24 hour hotline)   Patient verbalizes understanding of instructions and care plan provided today and agrees to view in [place]. Active MyChart status and patient understanding of how to access instructions and care plan via MyChart confirmed with patient.

## 2021-10-31 NOTE — Chronic Care Management (AMB) (Signed)
Care Management Clinical Social Work Note  10/31/2021 Name: Katie [name] MRN: [id number] DOB: [birth year]  Katie [name] is a 76 y.o. year old female who is a primary care patient of Janith Lima, MD.  The Care Management team was consulted for assistance with chronic disease management and coordination needs.  Engaged with patient by telephone for follow up visit in response to provider referral for social work chronic care management and care coordination services  Consent to Services:  Ms. Pagan was given information about Care Management services today including:  Care Management services includes personalized support from designated clinical staff supervised by her physician, including individualized plan of care and coordination with other care providers 24/7 contact phone numbers for assistance for urgent and routine care needs. The patient may stop case management services at any time by phone call to the office staff.  Patient agreed to services and consent obtained.   Summary: Assessed patient's current treatment, progress, coping skills, support system and barriers to care.  She continues to experience stress with managing her health, and her husband's health though she believes he is able to manage his own health but is not doing so.  She also has other stressors that are out of her control.   .  See Care Plan below for interventions and patient self-care actives.  Recommendation: Patient may benefit from, and is in agreement to implement self-care actives discussed today and review educational information from [facility]. LCSW will continue with therapeutic interventions to assist with reducing symptoms of anxiety.    Follow up Plan: Patient would like continued follow-up from CCM LCSW.  per patient's request will follow up in 30 days.  Will call office if needed prior to next encounter.   Assessment: Review of patient past medical history, allergies, medications, and health status,  including review of relevant consultants reports was performed today as part of a comprehensive evaluation and provision of chronic care management and care coordination services.  SDOH (Social Determinants of Health) assessments and interventions performed:    Advanced Directives Status: Not addressed in this encounter.  Care Plan  Conditions to be addressed/monitored: Anxiety;   Care Plan : LCSW Plan of Care  Updates made by Maurine Cane, LCSW since 10/31/2021 12:00 AM     Problem: Coping Skills      Goal: Coping Skills Enhanced   Start Date: 08/24/2021  This Visit's Progress: On track  Recent Progress: On track  Priority: High  Note:   Current Barriers:  Disease Management support and education needs related to symptoms of anxiety and stress   CSW Clinical Goal(s):  Patient  will demonstrate a reduction in symptoms related to :Anxiety  and stress through collaboration with Clinical Education officer, museum, provider, and care team.   Interventions: Inter-disciplinary care team collaboration (see longitudinal plan of care) Evaluation of current treatment plan related to  self management and patient's adherence to plan as established by provider  Mental Health:  (Status: Goal on Track (progressing): YES.) Evaluation of current treatment plan related to Stress at home and anxiety Solution-Focused Strategies employed:  Mindfulness or Relaxation training provided Active listening / Reflection utilized  Emotional Support Provided Behavioral Activation reviewed [place] strategies reviewed Provided psychoeducation for mental health needs  Quality of sleep assessed & Sleep Hygiene techniques promoted  Provided EMMI education information on Relieving Stress and Insomnia and getting a good night's sleep  Task & activities to accomplish goals: Continue with your self-care action plan (doing things  your enjoy) Review your EMMI educational information (Relieving  stress; Insomnia and getting a good night's sleep ) Look for an e-mail from [facility]. Practice relaxed breathing 3 times a day based on scheduled you decided on( 8:00; 11:00 and 6:00 Continue with exercise within your limites as discussed with your doctor when going to the gym.     Casimer Lanius, LCSW Licensed Clinical Social Worker [name] [employer]  [place]  [phone]

## 2021-11-10 ENCOUNTER — Ambulatory Visit: Payer: Medicare Other | Admitting: Cardiology

## 2021-11-11 ENCOUNTER — Telehealth: Payer: Medicare Other

## 2021-11-17 ENCOUNTER — Encounter: Payer: Self-pay | Admitting: Cardiology

## 2021-11-17 ENCOUNTER — Ambulatory Visit: Payer: Medicare Other | Admitting: Cardiology

## 2021-11-17 VITALS — BP 145/77 | HR 89 | Temp 97.8°F | Resp 16 | Ht 64.0 in | Wt 160.6 lb

## 2021-11-17 DIAGNOSIS — E785 Hyperlipidemia, unspecified: Secondary | ICD-10-CM

## 2021-11-17 DIAGNOSIS — I429 Cardiomyopathy, unspecified: Secondary | ICD-10-CM

## 2021-11-17 DIAGNOSIS — I340 Nonrheumatic mitral (valve) insufficiency: Secondary | ICD-10-CM

## 2021-11-17 DIAGNOSIS — I361 Nonrheumatic tricuspid (valve) insufficiency: Secondary | ICD-10-CM

## 2021-11-17 DIAGNOSIS — I10 Essential (primary) hypertension: Secondary | ICD-10-CM

## 2021-11-17 NOTE — Patient Instructions (Signed)
Get blood work done today to check your renal function.   If your renal function is fine will start you on Entresto.   Call if questions arise.

## 2021-11-17 NOTE — Progress Notes (Signed)
[name], DOB [birth year], MRN [id number]  PCP:  Janith Lima, MD  Cardiologist:  Rex Kras, DO, [facility] (established care 06/03/2020)  Date: 11/17/21 Last Office Visit: 08/10/2021  Chief Complaint  Patient presents with   Cardiomyopathy   Follow-up    3 months    HPI  Katie [name] is a 76 y.o. female whose past medical history and cardiovascular risk factors include: Cardiomyopathy, History of DVT, GERD, hypertension chronic kidney disease stage, acquired hypothyroidism, hyperlipidemia, prediabetes, advanced age, postmenopausal female.  Patient is being monitored for progression of valvular heart disease and had an echocardiogram in March 2023.  She was noted to have mildly reduced LVEF, grade 1 diastolic impairment, and elevated left atrial pressures.  This is in comparison to her prior echo from January 2022 which noted an LVEF of 59% and normal left atrial pressure.  Of note, given the progression of her chronic kidney disease she currently is being managed/followed by nephrology and was discontinued from lisinopril and indapamide. After being diagnosed with her cardiomyopathy she did not undergo angiography or CCTA at that time as her renal function was elevated (Cr around 2.'09mg'$ /dL). In addition, she did not have CP and her recent MPI was noted to be low risk.   At last office visit patient was started on BiDil but was unable to tolerate isosorbide dinitrate.  And therefore she is currently on hydralazine 25 mg p.o. 3 times daily.  Recent labs from May 2023 noted improvement in renal function.  Clinically denies heart failure symptoms or angina factors.  Patient brings in her home blood pressure readings and her systolic blood pressures are consistently between 120-130 mmHg on current medical regimen.  FUNCTIONAL STATUS: Prior to the pandemic she would regularly go to gym. No structured exercise program or daily routine.   ALLERGIES: Allergies  Allergen Reactions    Lisinopril Other (See Comments) and Cough    Cough and throat clearing   Metoprolol Succinate Other (See Comments)    Unknown reaction   Penicillins Swelling    Has patient had a PCN reaction causing immediate rash, facial/tongue/throat swelling, SOB or lightheadedness with hypotension: Yes Has patient had a PCN reaction causing severe rash involving mucus membranes or skin necrosis: No Has patient had a PCN reaction that required hospitalization: No Has patient had a PCN reaction occurring within the last 10 years: No If all of the above answers are "NO", then may proceed with Cephalosporin use.    Sulfamethoxazole Other (See Comments)    Unknown reaction   Sulfamethoxazole-Trimethoprim Other (See Comments)    Unknown reaction   Trimethoprim Other (See Comments)    Unknown reaction   Metformin Nausea Only    MEDICATION LIST PRIOR TO VISIT: Current Meds  Medication Sig   ACCU-CHEK AVIVA PLUS test strip    allopurinol (ZYLOPRIM) 100 MG tablet Take 1 tablet (100 mg total) by mouth daily.   ALPRAZolam (XANAX) 0.5 MG tablet TAKE 1 TABLET BY MOUTH 3 TIMES  DAILY AS NEEDED FOR ANXIETY OR  SLEEP   aspirin 81 MG chewable tablet Chew 81 mg by mouth every morning.   cyanocobalamin (,VITAMIN B-12,) 1000 MCG/ML injection INJECT 1 ML  ONCE EVERY MONTH   Digestive Enzymes (DIGESTIVE ENZYME PO) Take 2 capsules by mouth every morning.   Dulaglutide (TRULICITY) 1.5 BO/1.7PZ SOPN Inject 1.5 mg into the skin See admin instructions. Inject 0.5 mls (1.5 mg) subcutaneously every other Sunday   famotidine (PEPCID) 20 MG tablet Take 2 tablets  by mouth once daily   hydrALAZINE (APRESOLINE) 25 MG tablet Take 1 tablet (25 mg total) by mouth 3 (three) times daily.   Lancet Devices (SIMPLE DIAGNOSTICS LANCING DEV) MISC    levothyroxine (SYNTHROID, LEVOTHROID) 88 MCG tablet Take 88 mcg by mouth daily before breakfast.   magnesium hydroxide (DULCOLAX) 400 MG/5ML suspension Take 15 mLs by mouth daily as needed for  mild constipation.   Polyethyl Glycol-Propyl Glycol (SYSTANE) 0.4-0.3 % SOLN Place 1 drop into both eyes daily as needed (dry eyes).   rosuvastatin (CRESTOR) 10 MG tablet Take 10 mg by mouth every morning.   vitamin E 180 MG (400 UNITS) capsule Take 400 Units by mouth every morning.     PAST MEDICAL HISTORY: Past Medical History:  Diagnosis Date   Acute venous embolism and thrombosis of unspecified deep vessels of lower extremity 2001   RLE DVT and PE, coumadin x 59mo now ASSA   Anxiety state, unspecified    Chronic idiopathic constipation 11/02/2016   Degeneration of cervical intervertebral disc    Gallstones    History of DVT (deep vein thrombosis)    Iron deficiency anemia secondary to blood loss (chronic)    Irritable bowel syndrome    Lumbago    Other B-complex deficiencies    Personal history of colonic polyps    adenomatous   Pure hypercholesterolemia    Reflux esophagitis    Tubular adenoma of colon 09/2002   Type II or unspecified type diabetes mellitus without mention of complication, not stated as uncontrolled    Unspecified essential hypertension    Unspecified hypothyroidism    Unspecified iridocyclitis    Unspecified venous (peripheral) insufficiency    Unspecified vitamin D deficiency     PAST SURGICAL HISTORY: Past Surgical History:  Procedure Laterality Date   BIOPSY  04/24/2018   Procedure: BIOPSY;  Surgeon: MIrving Copas, MD;  [facility];  Service: Gastroenterology;;   CWilmon PaliRELEASE     CATARACT EXTRACTION Bilateral 08/29/2017,09/19/17   ESOPHAGOGASTRODUODENOSCOPY (EGD) WITH PROPOFOL N/A 04/24/2018   Procedure: ESOPHAGOGASTRODUODENOSCOPY (EGD) WITH PROPOFOL;  Surgeon: MIrving Copas, MD;  [facility];  Service: Gastroenterology;  Laterality: N/A;  with EMR   FEMORAL HERNIA REPAIR     OOPHORECTOMY     POLYPECTOMY  04/24/2018   Procedure: POLYPECTOMY;  Surgeon: Mansouraty, GTelford Nab, MD;  [facility];  Service: Gastroenterology;;   UPPER ESOPHAGEAL ENDOSCOPIC ULTRASOUND (EUS)  04/24/2018   Procedure: UPPER ESOPHAGEAL ENDOSCOPIC ULTRASOUND (EUS);  Surgeon: MRush LandmarkGTelford Nab, MD;  [facility];  Service: Gastroenterology;;   VESICOVAGINAL FISTULA CLOSURE W/ TAH      FAMILY HISTORY: The patient family history includes Deep vein thrombosis in her mother; Diabetes in her mother; Healthy in her sister; Hypertension in her mother; Prostate cancer in her father.  SOCIAL HISTORY:  The patient  reports that she has never smoked. She has never used smokeless tobacco. She reports current alcohol use. She reports that she does not use drugs.  REVIEW OF SYSTEMS: Review of Systems  Cardiovascular:  Negative for chest pain, dyspnea on exertion, leg swelling, orthopnea, palpitations, paroxysmal nocturnal dyspnea and syncope.  Respiratory:  Negative for shortness of breath.     PHYSICAL EXAM:    11/17/2021   11:25 AM 10/05/2021    9:23 AM 08/15/2021    1:10 PM  Vitals with BMI  Height '5\' 4"'$  '5\' 4"'$  '5\' 4"'$   Weight 160 lbs 10 oz 160 lbs 2 oz 160 lbs  BMI 27.55 00.86 76.19  Systolic 509 326 712  Diastolic 77 86 72  Pulse 89 104 110    CONSTITUTIONAL: Well-developed and well-nourished. No acute distress.  SKIN: Skin is warm and dry. No rash noted. No cyanosis. No pallor. No jaundice HEAD: Normocephalic and atraumatic.  EYES: No scleral icterus.  Arcus senilis. MOUTH/THROAT: Moist oral membranes.  NECK: No JVD present. No thyromegaly noted. No carotid bruits  LYMPHATIC: No visible cervical adenopathy.  CHEST Normal respiratory effort. No intercostal retractions  LUNGS: Clear to auscultation bilaterally. No stridor. No wheezes. No rales.  CARDIOVASCULAR: Regular rate and rhythm, positive W5-Y0, soft holosystolic murmur left sternal border, no rubs or gallops appreciated. ABDOMINAL: soft, nontender, nondistended, positive bowel sounds all 4 quadrants. No apparent ascites.   EXTREMITIES: No peripheral edema, warm to touch HEMATOLOGIC: No significant bruising NEUROLOGIC: Oriented to person, place, and time. Nonfocal. Normal muscle tone.  PSYCHIATRIC: Normal mood and affect. Normal behavior. Cooperative  CARDIAC DATABASE: EKG: 07/13/2021: NSR, 97 bpm, LAE, LVH per voltage criteria, nonspecific T wave abnormality.   Echocardiogram: 07/27/2021: Mildly depressed LV systolic function with visual EF 45-50%. Left ventricle cavity is mildly dilated. Mild left ventricular hypertrophy. Normal global wall motion. Doppler evidence of grade I (impaired) diastolic dysfunction, elevated LAP. Moderate (Grade II) mitral regurgitation. Mild tricuspid regurgitation. No evidence of pulmonary hypertension. Moderate pulmonic regurgitation. Compared to study 06/09/2020: LVEF reduced from 59% to 45-50%, elevated LAP and moderate PR/MR are new findings, otherwise no significant change.  Stress Testing: Exercise tetrofosmin stress test  06/23/2020: Normal ECG stress. The patient exercised for 3 minutes and 33 seconds of a Bruce protocol, achieving 5.28 METs.   No chest pian, fatigue led to termination of stress. Normal BP response.   Myocardial perfusion reveals mild breast attenuation noted in the inferoseptal region. No ischemia or scar. Overall LV systolic function is abnormal without regional wall motion abnormalities. Stress LV EF: 42%.  Visually the LVEF appears normal. No previous exam available for comparison. Low risk.  Heart Catheterization: None  LABORATORY DATA:    Latest Ref Rng & Units 09/28/2021   10:12 AM 08/15/2021    2:06 PM 12/13/2020   10:03 AM  CBC  WBC 4.0 - 10.5 K/uL 3.9  4.3  4.6   Hemoglobin 12.0 - 15.0 g/dL 13.7  13.5  9.2   Hematocrit 36.0 - 46.0 % 40.4  39.7  28.0   Platelets 150 - 400 K/uL 174  203.0  234.0        Latest Ref Rng & Units 09/28/2021   10:12 AM 08/15/2021    2:06 PM 12/13/2020   10:03 AM  CMP  Glucose 70 - 99 mg/dL 124  94  112   BUN  8 - 23 mg/dL '14  11  15   '$ Creatinine 0.44 - 1.00 mg/dL 1.09  1.09  1.60   Sodium 135 - 145 mmol/L 140  140  139   Potassium 3.5 - 5.1 mmol/L 4.1  4.4  3.2   Chloride 98 - 111 mmol/L 105  106  101   CO2 22 - 32 mmol/L '29  29  30   '$ Calcium 8.9 - 10.3 mg/dL 9.8  10.2  9.3   Total Protein 6.5 - 8.1 g/dL 7.0  7.4    Total Bilirubin 0.3 - 1.2 mg/dL 1.4  1.3    Alkaline Phos 38 - 126 U/L 56  59    AST 15 - 41 U/L 23  20    ALT 0 -  44 U/L 18  12      Lipid Panel  Lab Results  Component Value Date   CHOL 126 08/15/2021   HDL 82.70 08/15/2021   LDLCALC 33 08/15/2021   LDLDIRECT 105.9 07/04/2007   TRIG 49.0 08/15/2021   CHOLHDL 2 08/15/2021    No components found for: "NTPROBNP" No results for input(s): "PROBNP" in the last 8760 hours. Recent Labs    12/13/20 1003 08/15/21 1406  TSH 1.93 4.41    BMP Recent Labs    12/13/20 1003 08/15/21 1406 09/28/21 1012  NA 139 140 140  K 3.2* 4.4 4.1  CL 101 106 105  CO2 '30 29 29  '$ GLUCOSE 112* 94 124*  BUN '15 11 14  '$ CREATININE 1.60* 1.09 1.09*  CALCIUM 9.3 10.2 9.8  GFRNONAA  --   --  53*    HEMOGLOBIN A1C Lab Results  Component Value Date   HGBA1C 5.3 08/15/2021    IMPRESSION:    ICD-10-CM   1. Cardiomyopathy  I42.9 Pro b natriuretic peptide (BNP)    Basic metabolic panel    Magnesium    2. Essential hypertension  I10     3. Hyperlipidemia  E78.5     4. Nonrheumatic mitral valve regurgitation  I34.0     5. Nonrheumatic tricuspid valve regurgitation  I36.1        RECOMMENDATIONS: Katie [name] is a 76 y.o. female whose past medical history and cardiac risk factors include: Newly discovered cardiomyopathy, History of DVT, GERD, hypertension chronic kidney disease stage , acquired hypothyroidism, hyperlipidemia, prediabetes, advanced age, postmenopausal female.  Cardiomyopathy Etiology unspecified.  Overall euvolemic/compensated for physical examination.  Echo March 2023: LVEF 45-50%, elevated left atrial pressure,  and grade 1 diastolic impairment, moderate MR/TR.  Unable to tolerate BiDil (isosorbide dinitrate discontinued due to headaches).  Currently on hydralazine. In the past, lisinopril and indapamide were discontinued by her nephrologist given her renal function. She had recent labs in May 2023 which noted improvement in her renal function.  Would like to verify these results.  If her serum creatinine levels are within acceptable limits would like to initiate Entresto 24/26 mg p.o. twice daily with repeat labs in 1 week. Denies angina pectoris.  In the past coronary CTA or angiography was prohibitive given her renal function which would predispose her to contrast-induced nephropathy.   We discussed undergoing ischemic work-up as her renal function notes to be improving; however, she would like to hold off any additional ischemic work-up at time.  We will continue medical therapy as per her wishes.  Essential hypertension Office blood pressures are higher than anticipated. Home blood pressure very well controlled. Continue current medical therapy.  Hyperlipidemia Currently on Crestor.   She denies myalgia or other side effects. Most recent lipids dated April 2023, independently reviewed as noted above.  FINAL MEDICATION LIST END OF ENCOUNTER: No orders of the defined types were placed in this encounter.   Medications Discontinued During This Encounter  Medication Reason   linaclotide (LINZESS) 290 MCG CAPS capsule       Current Outpatient Medications:    ACCU-CHEK AVIVA PLUS test strip, , Disp: , Rfl:    allopurinol (ZYLOPRIM) 100 MG tablet, Take 1 tablet (100 mg total) by mouth daily., Disp: 90 tablet, Rfl: 2   ALPRAZolam (XANAX) 0.5 MG tablet, TAKE 1 TABLET BY MOUTH 3 TIMES  DAILY AS NEEDED FOR ANXIETY OR  SLEEP, Disp: 270 tablet, Rfl: 1   aspirin 81 MG chewable tablet, Chew 81  mg by mouth every morning., Disp: , Rfl:    cyanocobalamin (,VITAMIN B-12,) 1000 MCG/ML injection, INJECT 1  ML  ONCE EVERY MONTH, Disp: 3 mL, Rfl: 0   Digestive Enzymes (DIGESTIVE ENZYME PO), Take 2 capsules by mouth every morning., Disp: , Rfl:    Dulaglutide (TRULICITY) 1.5 IW/5.8KD SOPN, Inject 1.5 mg into the skin See admin instructions. Inject 0.5 mls (1.5 mg) subcutaneously every other Sunday, Disp: , Rfl:    famotidine (PEPCID) 20 MG tablet, Take 2 tablets by mouth once daily, Disp: 180 tablet, Rfl: 0   hydrALAZINE (APRESOLINE) 25 MG tablet, Take 1 tablet (25 mg total) by mouth 3 (three) times daily., Disp: 270 tablet, Rfl: 3   Lancet Devices (SIMPLE DIAGNOSTICS LANCING DEV) MISC, , Disp: , Rfl:    levothyroxine (SYNTHROID, LEVOTHROID) 88 MCG tablet, Take 88 mcg by mouth daily before breakfast., Disp: , Rfl:    magnesium hydroxide (DULCOLAX) 400 MG/5ML suspension, Take 15 mLs by mouth daily as needed for mild constipation., Disp: , Rfl:    Polyethyl Glycol-Propyl Glycol (SYSTANE) 0.4-0.3 % SOLN, Place 1 drop into both eyes daily as needed (dry eyes)., Disp: , Rfl:    rosuvastatin (CRESTOR) 10 MG tablet, Take 10 mg by mouth every morning., Disp: , Rfl:    vitamin E 180 MG (400 UNITS) capsule, Take 400 Units by mouth every morning., Disp: , Rfl:   Orders Placed This Encounter  Procedures   Pro b natriuretic peptide (BNP)   Basic metabolic panel   Magnesium    Patient Instructions  Get blood work done today to check your renal function.   If your renal function is fine will start you on Entresto.   Call if questions arise.    --Continue cardiac medications as reconciled in final medication list. --Return in about 6 months (around 05/20/2022) for Follow up cardiomyopathy. . Or sooner if needed. --Continue follow-up with your primary care physician regarding the management of your other chronic comorbid conditions.  Patient's questions and concerns were addressed to her satisfaction. She voices understanding of the instructions provided during this encounter.   This note was created using a  voice recognition software as a result there may be grammatical errors inadvertently enclosed that do not reflect the nature of this encounter. Every attempt is made to correct such errors.  Rex Kras, [place], [facility]  Pager: [phone] Office: [phone]

## 2021-11-18 ENCOUNTER — Ambulatory Visit: Payer: Medicare Other | Admitting: *Deleted

## 2021-11-18 DIAGNOSIS — R7303 Prediabetes: Secondary | ICD-10-CM

## 2021-11-18 DIAGNOSIS — I10 Essential (primary) hypertension: Secondary | ICD-10-CM

## 2021-11-18 LAB — BASIC METABOLIC PANEL
BUN/Creatinine Ratio: 10 — ABNORMAL LOW (ref 12–28)
BUN: 14 mg/dL (ref 8–27)
CO2: 24 mmol/L (ref 20–29)
Calcium: 10.2 mg/dL (ref 8.7–10.3)
Chloride: 101 mmol/L (ref 96–106)
Creatinine, Ser: 1.34 mg/dL — ABNORMAL HIGH (ref 0.57–1.00)
Glucose: 103 mg/dL — ABNORMAL HIGH (ref 70–99)
Potassium: 5.1 mmol/L (ref 3.5–5.2)
Sodium: 138 mmol/L (ref 134–144)
eGFR: 41 mL/min/{1.73_m2} — ABNORMAL LOW (ref >59)

## 2021-11-18 LAB — MAGNESIUM: Magnesium: 2.2 mg/dL (ref 1.6–2.3)

## 2021-11-18 LAB — PRO B NATRIURETIC PEPTIDE: NT-Pro BNP: 755 pg/mL — ABNORMAL HIGH (ref 0–738)

## 2021-11-18 NOTE — Chronic Care Management (AMB) (Signed)
Care Management    RN Visit Note  11/18/2021 Name: Katie [name] MRN: [id number] DOB: Sep 27, 1945  Subjective: Katie [name] is a 76 y.o. year old female who is a primary care patient of Janith Lima, MD. The care management team was consulted for assistance with disease management and care coordination needs.    Engaged with patient by telephone for follow up visit/ clinic RN CM case closure in response to provider referral for case management and/or care coordination services.   Consent to Services:   Katie [name] was given information about Care Management services 08/16/21 including:  Care Management services includes personalized support from designated clinical staff supervised by her physician, including individualized plan of care and coordination with other care providers 24/7 contact phone numbers for assistance for urgent and routine care needs. The patient may stop case management services at any time by phone call to the office staff.  Patient agreed to services and consent obtained.   Assessment: Review of patient past medical history, allergies, medications, health status, including review of consultants reports, laboratory and other test data, was performed as part of comprehensive evaluation and provision of chronic care management services.   SDOH (Social Determinants of Health) assessments and interventions performed:  SDOH Interventions    Flowsheet Row Most Recent Value  SDOH Interventions   Food Insecurity Interventions Intervention Not Indicated  [continues to deny food insecurity]  Transportation Interventions Intervention Not Indicated  [normally drives self,  reports car remains in shop for repairs,  family members currently assisting with transportation needs]        Care Plan  Allergies  Allergen Reactions   Lisinopril Other (See Comments) and Cough    Cough and throat clearing   Metoprolol Succinate Other (See Comments)    Unknown reaction   Penicillins  Swelling    Has patient had a PCN reaction causing immediate rash, facial/tongue/throat swelling, SOB or lightheadedness with hypotension: Yes Has patient had a PCN reaction causing severe rash involving mucus membranes or skin necrosis: No Has patient had a PCN reaction that required hospitalization: No Has patient had a PCN reaction occurring within the last 10 years: No If all of the above answers are "NO", then may proceed with Cephalosporin use.    Sulfamethoxazole Other (See Comments)    Unknown reaction   Sulfamethoxazole-Trimethoprim Other (See Comments)    Unknown reaction   Trimethoprim Other (See Comments)    Unknown reaction   Metformin Nausea Only   Outpatient Encounter Medications as of 11/18/2021  Medication Sig   ACCU-CHEK AVIVA PLUS test strip    allopurinol (ZYLOPRIM) 100 MG tablet Take 1 tablet (100 mg total) by mouth daily.   ALPRAZolam (XANAX) 0.5 MG tablet TAKE 1 TABLET BY MOUTH 3 TIMES  DAILY AS NEEDED FOR ANXIETY OR  SLEEP   aspirin 81 MG chewable tablet Chew 81 mg by mouth every morning.   cyanocobalamin (,VITAMIN B-12,) 1000 MCG/ML injection INJECT 1 ML  ONCE EVERY MONTH   Digestive Enzymes (DIGESTIVE ENZYME PO) Take 2 capsules by mouth every morning.   Dulaglutide (TRULICITY) 1.5 JJ/9.4RD SOPN Inject 1.5 mg into the skin See admin instructions. Inject 0.5 mls (1.5 mg) subcutaneously every other Sunday   famotidine (PEPCID) 20 MG tablet Take 2 tablets by mouth once daily   hydrALAZINE (APRESOLINE) 25 MG tablet Take 1 tablet (25 mg total) by mouth 3 (three) times daily.   Lancet Devices (SIMPLE DIAGNOSTICS LANCING DEV) MISC    levothyroxine (SYNTHROID, LEVOTHROID) 88  MCG tablet Take 88 mcg by mouth daily before breakfast.   magnesium hydroxide (DULCOLAX) 400 MG/5ML suspension Take 15 mLs by mouth daily as needed for mild constipation.   Polyethyl Glycol-Propyl Glycol (SYSTANE) 0.4-0.3 % SOLN Place 1 drop into both eyes daily as needed (dry eyes).   rosuvastatin  (CRESTOR) 10 MG tablet Take 10 mg by mouth every morning.   vitamin E 180 MG (400 UNITS) capsule Take 400 Units by mouth every morning.   No facility-administered encounter medications on file as of 11/18/2021.   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 12/13/2020   MGUS (monoclonal gammopathy of unknown significance) 09/08/2020   Visit for screening mammogram 05/26/2020   Encounter for general adult medical examination with abnormal findings 05/24/2020   Chronic laryngitis 05/24/2020   Abnormal electrocardiogram (ECG) (EKG) 05/24/2020   Intestinal metaplasia of gastric mucosa 09/16/2019   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   PAD (peripheral artery disease) ([place]) 12/30/2018   Calculus of gallbladder with chronic cholecystitis without obstruction 02/06/2018   Chronic renal disease, stage 3, moderately decreased glomerular filtration rate (GFR) between 30-59 mL/min/1.73 square meter ([place]) 01/24/2018   Primary osteoarthritis of right hip 07/31/2017   Obesity, Class I, BMI 30.0-34.9 (see actual BMI) 09/20/2016   Spinal stenosis, lumbar region with neurogenic claudication 06/28/2016   Prediabetes 11/27/2012   Vitamin D deficiency 03/05/2008   Hyperlipidemia with target LDL less than 100 11/16/2007   Hypothyroidism 04/03/2007   B12 deficiency 04/03/2007   GAD (generalized anxiety disorder) 04/03/2007   PSEUDOTUMOR CEREBRI 04/03/2007   IRITIS 04/03/2007   Essential hypertension 04/03/2007   Venous (peripheral) insufficiency 04/03/2007   Reflux esophagitis 04/03/2007   Constipation 04/03/2007   DEGENERATIVE DISC DISEASE, CERVICAL SPINE 04/03/2007   Conditions to be addressed/monitored: HTN and DMII  Care Plan : [facility] Care Plan  Updates made by Katie Royalty, RN since 11/18/2021 12:00 AM     Problem: Chronic Disease Management Needs   Priority:  High  Onset Date: 09/16/2021     Long-Range Goal: Ongoing adherence to established plan of care for long term chronic disease management   Start Date: 09/16/2021  Expected End Date: 09/17/2022  Recent Progress: On track  Priority: High  Note:   Current Barriers:  Chronic Disease Management support and education needs related to HTN and DMII Caregiver strain around caring for her husband - currently working with Clinic CSW and anxiety management and coping skills 10/05/21- reports has received the forms for [facility] referral previously made by CSW; has not yet mailed- has questions for CSW; states their last conversation was "cut short" due to her husband incurring a fall during the conversation: she is requesting another [place] telephone appointment to follow up on [facility] referral/ questions: I will make scheduling care guide and CSW aware and request facilitation of scheduling with scheduler for next [place] telephone visit 7/07/3- confirms she has follow up telephone visit with clinic LCSW 11/29/21; reports stress in caring for husband persists as he continues having ongoing medical issues; she is hopeful that she will be able to eventually take some time this summer for herself to get away and rest/ relax  RNCM Clinical Goal(s):  Patient will continue to work with RN Care Manager and/or Social Worker to address care management and care coordination needs related to HTN and DMII as evidenced by adherence to CM Team Scheduled appointments  work with Education officer, museum to address [place] Concerns  related to the management of caregiver strain as evidenced by review of EMR and patient or social worker report     through collaboration with Consulting civil engineer, provider, and care team.   Interventions: Inter-disciplinary care team collaboration (see longitudinal plan of care) Evaluation of current treatment plan related to  self management and patient's adherence to plan as established by provider Review of patient  status, including review of consultants reports, relevant laboratory and other test results, and medications completed SDOH updated: no new/ unmet concerns identified Pain assessment completed: denies pain today Medications discussed: independently self-manage and denies current concerns/ issues/ questions around medications; endorses adherence to taking all medications as prescribed Reviewed yesterday's cardiology provider appointment: she verbalizes a good understanding of post-visit instructions and denies questions; currently waiting to receive follow up results form lab work Reviewed upcoming scheduled provider appointments: 11/29/21- clinic LCSW; 02/14/22- PCP; patient confirms is aware of all and has plans to attend as scheduled Discussed plans with patient for ongoing care management follow up- patient denies current care coordination/ care management needs and is agreeable to Clinic RN CM case closure today; verbalizes understanding to contact PCP or other care providers for any needs that arise in the future, and confirms he has contact information for all care providers     Pre- Diabetes:  (Status: 11/18/21: Goal Met.) Long Term Goal   Lab Results  Component Value Date   HGBA1C 5.3 08/15/2021   HGBA1C 6.2 12/13/2020   HGBA1C 5.9 05/24/2020   Lab Results  Component Value Date   MICROALBUR <0.7 12/30/2018   [place] 33 08/15/2021   CREATININE 1.09 08/15/2021  Provided education to patient about basic DM disease process; Reviewed prescribed diet with patient Carb Modified; low sugar ADA and heart healthy low salt Diet; Counseled on importance of regular laboratory monitoring as prescribed;        Confirmed patient continues to monitor and record daily fasting blood sugars a home- reviewed with patient: she reports consistent ranges between 90-125 Previously provided education around benefits of activity in setting of pre-DM and HTN discussed: patient is aware and is usually very active  at baseline, however, continues to report her vehicle has been in repair and she has not been exercising as much due to not having personal transportation- verbalizes plans to resume activity "as soon as possible," and this was encouraged-- she again states that staying active significantly helps in managing her anxiety and helps her with coping in general  Hypertension: (Status: 11/18/21: Goal Met.) Long Term Goal  Last practice recorded BP readings:  BP Readings from Last 3 Encounters:  11/17/21 (!) 145/77  10/05/21 (!) 159/86  08/15/21 130/72  Most recent eGFR/CrCl: No results found for: EGFR  No components found for: CRCL  Evaluation of current treatment plan related to hypertension self management and patient's adherence to plan as established by provider;   Counseled on the importance of exercise goals with target of 150 minutes per week Advised patient, providing education and rationale, to monitor blood pressure daily and record, calling PCP for findings outside established parameters;  Confirmed patient continues monitoring and writing down on paper daily blood pressures at home: she reports consistent values between 120-136/ 70-84 Confirmed patient tries to follow heart healthy low salt diet    Plan:  No further follow up required: patient denies current care coordination/ care management needs and is agreeable to Clinic RN CM case closure today; Clinic  RN CM case closure accordingly      Oneta Rack, RN, BSN, [place] [phone]: direct office

## 2021-11-25 ENCOUNTER — Other Ambulatory Visit: Payer: Self-pay | Admitting: Cardiology

## 2021-11-25 DIAGNOSIS — I429 Cardiomyopathy, unspecified: Secondary | ICD-10-CM

## 2021-11-25 MED ORDER — ENTRESTO 24-26 MG PO TABS: 1.0000 | ORAL_TABLET | Freq: Two times a day (BID) | ORAL | 0 refills | Status: DC

## 2021-11-25 NOTE — Progress Notes (Signed)
Spoke to the patient over the phone.  Given the reduced LVEF, slight elevation in NT proBNP, and improved renal function compared to a year ago recommend a trial of Entresto 24/26 mg p.o. twice daily.  Once she starts Entresto I have asked her to check her blood pressures and if her readings are less than 130 mmHg she can hold off on hydralazine.  She will need to get labs done in 1 week to reevaluate kidney function  I have asked her to come to the office to pick up the Entresto coupon for the first 30 days which will help mitigate the cost while she figures out her out-of-pocket expense.   Teralyn Mullins [place], DO, [facility]

## 2021-11-28 ENCOUNTER — Telehealth: Payer: Self-pay | Admitting: Internal Medicine

## 2021-11-28 NOTE — Telephone Encounter (Signed)
Pt called in and states she needs to cancel her appt that is scheduled for 2pm on 7.18.2023.   Please call to r/s.

## 2021-11-29 ENCOUNTER — Ambulatory Visit: Payer: Self-pay | Admitting: Licensed Clinical Social Worker

## 2021-11-29 NOTE — Patient Instructions (Signed)
Visit Information  Congratulations on completing your goal and managing your stress.  It has been a pleasure working with you. No follow up scheduled, per our conversation you do not require continued follow up I will disconnect from your care team at this time, please call the office if additional needs are identified   Casimer Lanius, LCSW Licensed Clinical Social Worker [place] [facility]/ [employer] [phone]   If you are experiencing a [place] or [place] or need someone to talk to, please call the Suicide and Crisis Lifeline: 988 call 1-800-273-TALK (toll free, 24 hour hotline)   Patient verbalizes understanding of instructions and care plan provided today and agrees to view in [place]. Active MyChart status and patient understanding of how to access instructions and care plan via MyChart confirmed with patient.

## 2021-11-29 NOTE — Patient Outreach (Signed)
Care Management Clinical Social Work Note  11/29/2021 Name: Katie [name] MRN: [id number] DOB: 1945-06-04  Katie [name] is a 76 y.o. year old female who is a primary care patient of Katie Lima, MD.  The Care Management team was consulted for assistance with chronic disease management and coordination needs.  Engaged with patient by telephone for follow up visit in response to provider referral for social work chronic care management and care coordination services  Consent to Services:  Katie [name] was given information about Care Management services today including:  Care Management services includes personalized support from designated clinical staff supervised by her physician, including individualized plan of care and coordination with other care providers 24/7 contact phone numbers for assistance for urgent and routine care needs. The patient may stop case management services at any time by phone call to the office staff.  Patient agreed to services and consent obtained.   Summary:  Patient is making progress with goals, reports she is feeling betting and managing her stressors much better .  See Care Plan below for interventions and patient self-care actives.  Recommendation: Patient may benefit from, and is in agreement to continue with interventions discussed.   Follow up Plan:  Patient does not require continued follow-up by CCM LCSW. Will contact the office if needed CCM LCSW will disconnect from patient's care team at this time, but will be available at any time they would like to re-engage for care coordination services.    Assessment: Review of patient past medical history, allergies, medications, and health status, including review of relevant consultants reports was performed today as part of a comprehensive evaluation and provision of chronic care management and care coordination services.  SDOH (Social Determinants of Health) assessments and interventions performed:     Advanced Directives Status: Not addressed in this encounter.  Care Plan   Conditions to be addressed/monitored: Anxiety;   Care Plan : LCSW Plan of Care  Updates made by Maurine Cane, LCSW since 11/29/2021 12:00 AM     Problem: Coping Skills Resolved 11/29/2021     Goal: Coping Skills Enhanced Completed 11/29/2021  Start Date: 08/24/2021  This Visit's Progress: On track  Recent Progress: On track  Priority: High  Note:   Current Barriers:  Disease Management support and education needs related to symptoms of anxiety and stress   CSW Clinical Goal(s):  Patient  will demonstrate a reduction in symptoms related to :Anxiety  and stress through collaboration with Clinical Education officer, museum, provider, and care team.   Interventions: Inter-disciplinary care team collaboration (see longitudinal plan of care) Evaluation of current treatment plan related to  self management and patient's adherence to plan as established by provider  Mental Health:  (Status: Goal on Track (progressing): YES. Goal Met.) Evaluation of current treatment plan related to Stress at home and anxiety Solution-Focused Strategies employed:  Mindfulness or Relaxation training provided Active listening / Reflection utilized  Emotional Support Provided Behavioral Activation reviewed Problem [place] strategies reviewed Provided psychoeducation for mental health needs  Quality of sleep assessed & Sleep Hygiene techniques promoted  Provided EMMI education information on Relieving Stress and Insomnia and getting a good night's sleep  Task & activities to accomplish goals: Continue with your self-care action plan (doing things your enjoy) Review your EMMI educational information (Relieving stress; Insomnia and getting a good night's sleep ) Look for an e-mail from [facility]. Practice relaxed breathing 3 times a day based on scheduled you decided on(  8:00; 11:00 and 6:00 Continue with  exercise within your limites as discussed with your doctor when going to the gym.     Casimer Lanius, LCSW Licensed Clinical Social Worker [place] [facility]/ [employer] [phone]

## 2021-12-06 ENCOUNTER — Telehealth: Payer: Self-pay | Admitting: Cardiology

## 2021-12-06 NOTE — Telephone Encounter (Signed)
Patient wants to go over a few medications, discuss why she is taking them, etc. Can call her cell phone # indicated in chart.

## 2021-12-07 NOTE — Telephone Encounter (Signed)
Pt called asking what was the reason why she was using Entresto. Explain to pt that on her last note it mention that she is using entresto for cardiomyopathy. Pt understood, pt will go next week to do labs

## 2021-12-16 LAB — BASIC METABOLIC PANEL
BUN/Creatinine Ratio: 12 (ref 12–28)
BUN: 17 mg/dL (ref 8–27)
CO2: 28 mmol/L (ref 20–29)
Calcium: 10.2 mg/dL (ref 8.7–10.3)
Chloride: 103 mmol/L (ref 96–106)
Creatinine, Ser: 1.38 mg/dL — ABNORMAL HIGH (ref 0.57–1.00)
Glucose: 92 mg/dL (ref 70–99)
Potassium: 4.3 mmol/L (ref 3.5–5.2)
Sodium: 143 mmol/L (ref 134–144)
eGFR: 40 mL/min/{1.73_m2} — ABNORMAL LOW (ref >59)

## 2021-12-16 LAB — MAGNESIUM: Magnesium: 2.3 mg/dL (ref 1.6–2.3)

## 2021-12-16 LAB — PRO B NATRIURETIC PEPTIDE: NT-Pro BNP: 497 pg/mL (ref 0–738)

## 2021-12-18 ENCOUNTER — Other Ambulatory Visit: Payer: Self-pay | Admitting: Cardiology

## 2021-12-18 DIAGNOSIS — I10 Essential (primary) hypertension: Secondary | ICD-10-CM

## 2021-12-18 MED ORDER — HYDRALAZINE HCL 25 MG PO TABS: 25.0000 mg | ORAL_TABLET | Freq: Two times a day (BID) | ORAL | 3 refills | Status: DC

## 2021-12-18 NOTE — Progress Notes (Signed)
Spoke to the patient and labs reviewed.  These labs do reflect her being on Entresto.  I have asked her to reduce Hydralazine to '25mg'$  BID as her current home BP is <125 mmHG and she at times gets dizzy but no near syncope or syncope.  Increase water intake by 2-3 glasses per day.   Dr. Terri Skains

## 2021-12-24 ENCOUNTER — Other Ambulatory Visit: Payer: Self-pay | Admitting: Internal Medicine

## 2021-12-24 DIAGNOSIS — E538 Deficiency of other specified B group vitamins: Secondary | ICD-10-CM

## 2021-12-24 NOTE — Telephone Encounter (Signed)
Please refill as per office routine med refill policy (all routine meds to be refilled for 3 mo or monthly (per pt preference) up to one year from last visit, then month to month grace period for 3 mo, then further med refills will have to be denied) ? ?

## 2021-12-29 ENCOUNTER — Other Ambulatory Visit: Payer: Self-pay | Admitting: Internal Medicine

## 2021-12-29 DIAGNOSIS — K2100 Gastro-esophageal reflux disease with esophagitis, without bleeding: Secondary | ICD-10-CM

## 2021-12-31 ENCOUNTER — Other Ambulatory Visit: Payer: Self-pay | Admitting: Cardiology

## 2021-12-31 ENCOUNTER — Other Ambulatory Visit: Payer: Self-pay | Admitting: Internal Medicine

## 2021-12-31 DIAGNOSIS — I429 Cardiomyopathy, unspecified: Secondary | ICD-10-CM

## 2022-02-09 ENCOUNTER — Other Ambulatory Visit: Payer: Self-pay

## 2022-02-09 DIAGNOSIS — I10 Essential (primary) hypertension: Secondary | ICD-10-CM

## 2022-02-11 ENCOUNTER — Other Ambulatory Visit: Payer: Self-pay | Admitting: Internal Medicine

## 2022-02-11 ENCOUNTER — Other Ambulatory Visit: Payer: Self-pay | Admitting: Cardiology

## 2022-02-11 DIAGNOSIS — I429 Cardiomyopathy, unspecified: Secondary | ICD-10-CM

## 2022-02-11 NOTE — Telephone Encounter (Signed)
Please refill as per office routine med refill policy (all routine meds to be refilled for 3 mo or monthly (per pt preference) up to one year from last visit, then month to month grace period for 3 mo, then further med refills will have to be denied) ? ?

## 2022-02-14 ENCOUNTER — Encounter: Payer: Self-pay | Admitting: Internal Medicine

## 2022-02-14 ENCOUNTER — Ambulatory Visit: Payer: Medicare Other | Admitting: Internal Medicine

## 2022-02-14 VITALS — BP 118/64 | HR 62 | Temp 98.3°F | Ht 64.0 in | Wt 164.0 lb

## 2022-02-14 DIAGNOSIS — E039 Hypothyroidism, unspecified: Secondary | ICD-10-CM | POA: Diagnosis not present

## 2022-02-14 DIAGNOSIS — N1832 Chronic kidney disease, stage 3b: Secondary | ICD-10-CM | POA: Diagnosis not present

## 2022-02-14 DIAGNOSIS — M1A3721 Chronic gout due to renal impairment, left ankle and foot, with tophus (tophi): Secondary | ICD-10-CM | POA: Diagnosis not present

## 2022-02-14 DIAGNOSIS — R209 Unspecified disturbances of skin sensation: Secondary | ICD-10-CM | POA: Insufficient documentation

## 2022-02-14 DIAGNOSIS — I739 Peripheral vascular disease, unspecified: Secondary | ICD-10-CM

## 2022-02-14 DIAGNOSIS — E538 Deficiency of other specified B group vitamins: Secondary | ICD-10-CM | POA: Diagnosis not present

## 2022-02-14 DIAGNOSIS — R7303 Prediabetes: Secondary | ICD-10-CM

## 2022-02-14 LAB — BASIC METABOLIC PANEL
BUN: 9 mg/dL (ref 6–23)
CO2: 28 mEq/L (ref 19–32)
Calcium: 9.7 mg/dL (ref 8.4–10.5)
Chloride: 104 mEq/L (ref 96–112)
Creatinine, Ser: 1.15 mg/dL (ref 0.40–1.20)
GFR: 46.08 mL/min — ABNORMAL LOW (ref >60.00)
Glucose, Bld: 85 mg/dL (ref 70–99)
Potassium: 5.1 mEq/L (ref 3.5–5.1)
Sodium: 138 mEq/L (ref 135–145)

## 2022-02-14 LAB — CBC WITH DIFFERENTIAL/PLATELET
Basophils Absolute: 0 10*3/uL (ref 0.0–0.1)
Basophils Relative: 0.7 % (ref 0.0–3.0)
Eosinophils Absolute: 0.2 10*3/uL (ref 0.0–0.7)
Eosinophils Relative: 5.2 % — ABNORMAL HIGH (ref 0.0–5.0)
HCT: 38.5 % (ref 36.0–46.0)
Hemoglobin: 13.2 g/dL (ref 12.0–15.0)
Lymphocytes Relative: 34.4 % (ref 12.0–46.0)
Lymphs Abs: 1.3 10*3/uL (ref 0.7–4.0)
MCHC: 34.3 g/dL (ref 30.0–36.0)
MCV: 83.4 fl (ref 78.0–100.0)
Monocytes Absolute: 0.4 10*3/uL (ref 0.1–1.0)
Monocytes Relative: 10.9 % (ref 3.0–12.0)
Neutro Abs: 1.8 10*3/uL (ref 1.4–7.7)
Neutrophils Relative %: 48.8 % (ref 43.0–77.0)
Platelets: 185 10*3/uL (ref 150.0–400.0)
RBC: 4.62 Mil/uL (ref 3.87–5.11)
RDW: 14.3 % (ref 11.5–15.5)
WBC: 3.7 10*3/uL — ABNORMAL LOW (ref 4.0–10.5)

## 2022-02-14 LAB — TSH: TSH: 2.69 u[IU]/mL (ref 0.35–5.50)

## 2022-02-14 LAB — FOLATE: Folate: 17.4 ng/mL (ref >5.9)

## 2022-02-14 LAB — HEMOGLOBIN A1C: Hgb A1c MFr Bld: 5.3 % (ref 4.6–6.5)

## 2022-02-14 LAB — URIC ACID: Uric Acid, Serum: 4 mg/dL (ref 2.4–7.0)

## 2022-02-14 MED ORDER — ALLOPURINOL 100 MG PO TABS: 100.0000 mg | ORAL_TABLET | Freq: Every day | ORAL | 1 refills | Status: DC

## 2022-02-14 NOTE — Patient Instructions (Signed)

## 2022-02-14 NOTE — Progress Notes (Signed)
Subjective:  Patient ID: Katie [name], female    DOB: Mar 22, 1946  Age: 76 y.o. MRN: [id number]  CC: Hypothyroidism   HPI Katie [name] presents for f/up -   She complains of cold feet.  She works out on a treadmill and does not experience chest pain, shortness of breath, diaphoresis, fatigue, or claudication.  Outpatient Medications Prior to Visit  Medication Sig Dispense Refill   ACCU-CHEK AVIVA PLUS test strip      ALPRAZolam (XANAX) 0.5 MG tablet TAKE 1 TABLET BY MOUTH 3 TIMES  DAILY AS NEEDED FOR ANXIETY OR  SLEEP 270 tablet 1   aspirin 81 MG chewable tablet Chew 81 mg by mouth every morning.     cyanocobalamin (VITAMIN B12) 1000 MCG/ML injection INJECT 1 ML  ONCE EVERY MONTH 3 mL 0   Digestive Enzymes (DIGESTIVE ENZYME PO) Take 2 capsules by mouth every morning.     ENTRESTO 24-26 MG Take 1 tablet by mouth twice daily 60 tablet 0   famotidine (PEPCID) 20 MG tablet Take 2 tablets by mouth once daily 180 tablet 0   hydrALAZINE (APRESOLINE) 25 MG tablet Take 1 tablet (25 mg total) by mouth in the morning and at bedtime. 270 tablet 3   Lancet Devices (SIMPLE DIAGNOSTICS LANCING DEV) MISC      levothyroxine (SYNTHROID, LEVOTHROID) 88 MCG tablet Take 88 mcg by mouth daily before breakfast.     magnesium hydroxide (DULCOLAX) 400 MG/5ML suspension Take 15 mLs by mouth daily as needed for mild constipation.     Polyethyl Glycol-Propyl Glycol (SYSTANE) 0.4-0.3 % SOLN Place 1 drop into both eyes daily as needed (dry eyes).     rosuvastatin (CRESTOR) 10 MG tablet Take 10 mg by mouth every morning.     vitamin E 180 MG (400 UNITS) capsule Take 400 Units by mouth every morning.     allopurinol (ZYLOPRIM) 100 MG tablet Take 1 tablet by mouth once daily 30 tablet 0   Dulaglutide (TRULICITY) 1.5 CH/8.8FO SOPN Inject 1.5 mg into the skin See admin instructions. Inject 0.5 mls (1.5 mg) subcutaneously every other Sunday     No facility-administered medications prior to visit.    ROS Review of Systems   Constitutional:  Negative for diaphoresis and fatigue.  HENT: Negative.    Eyes: Negative.   Respiratory:  Negative for cough, chest tightness, shortness of breath and wheezing.   Cardiovascular:  Negative for chest pain, palpitations and leg swelling.  Gastrointestinal:  Negative for abdominal pain, constipation, diarrhea, nausea and vomiting.  Endocrine: Positive for cold intolerance. Negative for heat intolerance.  Genitourinary: Negative.  Negative for difficulty urinating.  Musculoskeletal: Negative.   Skin: Negative.   Neurological:  Negative for dizziness, weakness and light-headedness.  Hematological:  Negative for adenopathy. Does not bruise/bleed easily.  Psychiatric/Behavioral: Negative.      Objective:  BP 118/64 (BP Location: Left Arm, Patient Position: Sitting, Cuff Size: Large)   Pulse 62   Temp 98.3 F (36.8 C) (Oral)   Ht '5\' 4"'$  (1.626 m)   Wt 164 lb (74.4 kg)   SpO2 99%   BMI 28.15 kg/m   BP Readings from Last 3 Encounters:  02/14/22 118/64  11/17/21 (!) 145/77  10/05/21 (!) 159/86    Wt Readings from Last 3 Encounters:  02/14/22 164 lb (74.4 kg)  11/17/21 160 lb 9.6 oz (72.8 kg)  10/05/21 160 lb 1.6 oz (72.6 kg)    Physical Exam Vitals reviewed.  HENT:     Nose: Nose normal.  Mouth/Throat:     Mouth: Mucous membranes are moist.  Eyes:     General: No scleral icterus.    Conjunctiva/sclera: Conjunctivae normal.  Cardiovascular:     Rate and Rhythm: Normal rate and regular rhythm.     Heart sounds: No murmur heard. Pulmonary:     Effort: Pulmonary effort is normal.     Breath sounds: No stridor. No wheezing or rhonchi.  Abdominal:     General: Abdomen is flat.     Palpations: There is no mass.     Tenderness: There is no abdominal tenderness. There is no guarding.     Hernia: No hernia is present.  Musculoskeletal:        General: Normal range of motion.     Cervical back: Neck supple.     Right lower leg: No edema.     Left lower  leg: No edema.  Lymphadenopathy:     Cervical: No cervical adenopathy.  Skin:    General: Skin is warm and dry.  Neurological:     General: No focal deficit present.     Mental Status: She is alert.     Lab Results  Component Value Date   WBC 3.7 (L) 02/14/2022   HGB 13.2 02/14/2022   HCT 38.5 02/14/2022   PLT 185.0 02/14/2022   GLUCOSE 85 02/14/2022   CHOL 126 08/15/2021   TRIG 49.0 08/15/2021   HDL 82.70 08/15/2021   LDLDIRECT 105.9 07/04/2007   LDLCALC 33 08/15/2021   ALT 18 09/28/2021   AST 23 09/28/2021   NA 138 02/14/2022   K 5.1 02/14/2022   CL 104 02/14/2022   CREATININE 1.15 02/14/2022   BUN 9 02/14/2022   CO2 28 02/14/2022   TSH 2.69 02/14/2022   HGBA1C 5.3 02/14/2022   MICROALBUR <0.7 12/30/2018    VAS [name] LOWER EXTREMITY VENOUS (DVT) (ONLY [facility] & [facility])  Result Date: 06/18/2021  Lower Venous DVT Study Patient Name:  Katie [name]  Date of Exam:   06/17/2021 Medical Rec #: [id number]   Accession #:    [id number] Date of Birth: [birth year]   Patient Gender: F Patient Age:   61 years Exam Location:  [facility] Procedure:      VAS [name] LOWER EXTREMITY VENOUS (DVT) Referring Phys: Myna Bright --------------------------------------------------------------------------------  Indications: Pain, and Edema.  Comparison Study: No prior study Performing Technologist: Maudry Mayhew MHA, RDMS, RVT, RDCS  Examination Guidelines: A complete evaluation includes B-mode imaging, spectral Doppler, color Doppler, and power Doppler as needed of all accessible portions of each vessel. Bilateral testing is considered an integral part of a complete examination. Limited examinations for reoccurring indications may be performed as noted. The reflux portion of the exam is performed with the patient in reverse Trendelenburg.  +---------+---------------+---------+-----------+----------+--------------+ RIGHT    CompressibilityPhasicitySpontaneityPropertiesThrombus Aging  +---------+---------------+---------+-----------+----------+--------------+ CFV      Full           Yes      Yes                                 +---------+---------------+---------+-----------+----------+--------------+ SFJ      Full                                                        +---------+---------------+---------+-----------+----------+--------------+  FV Prox  Full                                                        +---------+---------------+---------+-----------+----------+--------------+ FV Mid   Full                                                        +---------+---------------+---------+-----------+----------+--------------+ FV DistalFull                                                        +---------+---------------+---------+-----------+----------+--------------+ PFV      Full                                                        +---------+---------------+---------+-----------+----------+--------------+ POP      Full           Yes      Yes                                 +---------+---------------+---------+-----------+----------+--------------+ PTV      Full                                                        +---------+---------------+---------+-----------+----------+--------------+ PERO     Full                                                        +---------+---------------+---------+-----------+----------+--------------+   +----+---------------+---------+-----------+----------+--------------+ LEFTCompressibilityPhasicitySpontaneityPropertiesThrombus Aging +----+---------------+---------+-----------+----------+--------------+ CFV Full           Yes      Yes                                 +----+---------------+---------+-----------+----------+--------------+     Summary: RIGHT: - There is no evidence of deep vein thrombosis in the lower extremity.  - No cystic structure found in the popliteal fossa.   LEFT: - No evidence of common femoral vein obstruction.  *See table(s) above for measurements and observations. Electronically signed by Harold Barban MD on 06/18/2021 at 5:31:52 PM.    Final    DG Foot Complete Right  Result Date: 06/17/2021 CLINICAL DATA:  Right foot pain EXAM: RIGHT FOOT COMPLETE - 3+ VIEW COMPARISON:  None. FINDINGS: No acute fracture or dislocation identified. Degenerative changes in the midfoot with mild joint space narrowing and dorsal osteophytes. Mild degenerative changes at the first metatarsophalangeal joint.  Prominent plantar calcaneal spur. Mild soft tissue swelling in the forefoot. IMPRESSION: Degenerative changes with no acute osseous abnormality identified. Mild soft tissue swelling. Electronically Signed   By: Ofilia Neas M.D.   On: 06/17/2021 12:18    Assessment & Plan:   Nazariah was seen today for hypothyroidism.  Diagnoses and all orders for this visit:  Acquired hypothyroidism- She is euthyroid. 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  Stage 3b chronic kidney disease ([place])- Will avoid nephrotoxic agents. -     Basic metabolic panel; Future -     Basic metabolic panel  Prediabetes -     Basic metabolic panel; Future -     Hemoglobin A1c; Future -     Hemoglobin A1c -     Basic metabolic panel  Cold feet -     VAS [name] ABI WITH/WO TBI; Future  PAD (peripheral artery disease) (HCC)  B12 deficiency- H/H are normal. -     CBC with Differential/Platelet; Future -     Folate; Future -     Folate -     CBC with Differential/Platelet   I have discontinued Jenilee Stamey's Trulicity. I have also changed her allopurinol. Additionally, I am having her maintain her levothyroxine, Accu-Chek Aviva Plus, [employer] Lancing Dev, rosuvastatin, Digestive Enzymes (DIGESTIVE  ENZYME PO), Dulcolax, aspirin, vitamin E, Systane, ALPRAZolam, hydrALAZINE, cyanocobalamin, famotidine, and Entresto.  Meds ordered this encounter  Medications   allopurinol (ZYLOPRIM) 100 MG tablet    Sig: Take 1 tablet (100 mg total) by mouth daily.    Dispense:  90 tablet    Refill:  1     Follow-up: Return in about 6 months (around 08/16/2022).  Scarlette Calico, MD

## 2022-02-17 ENCOUNTER — Telehealth: Payer: Self-pay | Admitting: Internal Medicine

## 2022-02-17 NOTE — Telephone Encounter (Signed)
Left message for patient to call back to schedule Medicare Annual Wellness Visit   Last AWV  02/18/21  Please schedule at anytime with [facility] if patient calls the office back.  Any questions, please call me at [phone]

## 2022-02-23 ENCOUNTER — Ambulatory Visit (INDEPENDENT_AMBULATORY_CARE_PROVIDER_SITE_OTHER): Payer: Medicare Other

## 2022-02-23 DIAGNOSIS — Z0000 Encounter for general adult medical examination without abnormal findings: Secondary | ICD-10-CM

## 2022-02-23 NOTE — Progress Notes (Signed)
Virtual Visit via Telephone Note  I connected with  Katie [name] on 02/23/22 at 10:45 AM EDT by telephone and verified that I am speaking with the correct person using two identifiers.  Location: Patient: Home Provider: [place] Persons participating in the virtual visit: [place]   I discussed the limitations, risks, security and privacy concerns of performing an evaluation and management service by telephone and the availability of in person appointments. The patient expressed understanding and agreed to proceed.  Interactive audio and video telecommunications were attempted between this nurse and patient, however failed, due to patient having technical difficulties OR patient did not have access to video capability.  We continued and completed visit with audio only.  Some vital signs may be absent or patient reported.   Sheral Flow, LPN  Subjective:   Katie [name] is a 76 y.o. female who presents for Medicare Annual (Subsequent) preventive examination.  Review of Systems     Cardiac Risk Factors include: advanced age (>32mn, >>81women);dyslipidemia;family history of premature cardiovascular disease;hypertension     Objective:    There were no vitals filed for this visit. There is no height or weight on file to calculate BMI.     02/23/2022   10:51 AM 02/18/2021    4:14 PM 08/11/2020   10:59 AM 10/30/2019   10:03 AM 04/24/2018   10:06 AM 03/28/2018    9:45 AM 06/21/2017   11:50 AM  Advanced Directives  Does Patient Have a Medical Advance Directive? No No No Yes Yes No Yes  Type of Advance Directive    Living will Living will  Living will  Does patient want to make changes to medical advance directive?    No - Patient declined     Would patient like information on creating a medical advance directive? No - Patient declined No - Patient declined Yes (MAU/Ambulatory/Procedural Areas - Information given)   Yes (ED - Information included in AVS)      Current Medications (verified) Outpatient Encounter Medications as of 02/23/2022  Medication Sig   ACCU-CHEK AVIVA PLUS test strip    allopurinol (ZYLOPRIM) 100 MG tablet Take 1 tablet (100 mg total) by mouth daily.   ALPRAZolam (XANAX) 0.5 MG tablet TAKE 1 TABLET BY MOUTH 3 TIMES  DAILY AS NEEDED FOR ANXIETY OR  SLEEP   aspirin 81 MG chewable tablet Chew 81 mg by mouth every morning.   cyanocobalamin (VITAMIN B12) 1000 MCG/ML injection INJECT 1 ML  ONCE EVERY MONTH   Digestive Enzymes (DIGESTIVE ENZYME PO) Take 2 capsules by mouth every morning.   ENTRESTO 24-26 MG Take 1 tablet by mouth twice daily   famotidine (PEPCID) 20 MG tablet Take 2 tablets by mouth once daily   hydrALAZINE (APRESOLINE) 25 MG tablet Take 1 tablet (25 mg total) by mouth in the morning and at bedtime.   Lancet Devices (SIMPLE DIAGNOSTICS LANCING DEV) MISC    levothyroxine (SYNTHROID, LEVOTHROID) 88 MCG tablet Take 88 mcg by mouth daily before breakfast.   magnesium hydroxide (DULCOLAX) 400 MG/5ML suspension Take 15 mLs by mouth daily as needed for mild constipation.   Polyethyl Glycol-Propyl Glycol (SYSTANE) 0.4-0.3 % SOLN Place 1 drop into both eyes daily as needed (dry eyes).   rosuvastatin (CRESTOR) 10 MG tablet Take 10 mg by mouth every morning.   vitamin E 180 MG (400 UNITS) capsule Take 400 Units by mouth every morning.   No facility-administered encounter medications on file as of 02/23/2022.    Allergies (  verified) Lisinopril, Metoprolol succinate, Penicillins, Sulfamethoxazole, Sulfamethoxazole-trimethoprim, Trimethoprim, and Metformin   History: Past Medical History:  Diagnosis Date   Acute venous embolism and thrombosis of unspecified deep vessels of lower extremity 2001   RLE DVT and PE, coumadin x 93mo now ASSA   Anxiety state, unspecified    Chronic idiopathic constipation 11/02/2016   Degeneration of cervical intervertebral disc    Gallstones    History of DVT (deep vein thrombosis)     Iron deficiency anemia secondary to blood loss (chronic)    Irritable bowel syndrome    Lumbago    Other B-complex deficiencies    Personal history of colonic polyps    adenomatous   Pure hypercholesterolemia    Reflux esophagitis    Tubular adenoma of colon 09/2002   Type II or unspecified type diabetes mellitus without mention of complication, not stated as uncontrolled    Unspecified essential hypertension    Unspecified hypothyroidism    Unspecified iridocyclitis    Unspecified venous (peripheral) insufficiency    Unspecified vitamin D deficiency    Past Surgical History:  Procedure Laterality Date   BIOPSY  04/24/2018   Procedure: BIOPSY;  Surgeon: MIrving Copas, MD;  [facility];  Service: Gastroenterology;;   CWilmon PaliRELEASE     CATARACT EXTRACTION Bilateral 08/29/2017,09/19/17   ESOPHAGOGASTRODUODENOSCOPY (EGD) WITH PROPOFOL N/A 04/24/2018   Procedure: ESOPHAGOGASTRODUODENOSCOPY (EGD) WITH PROPOFOL;  Surgeon: MIrving Copas, MD;  [facility];  Service: Gastroenterology;  Laterality: N/A;  with EMR   FEMORAL HERNIA REPAIR     OOPHORECTOMY     POLYPECTOMY  04/24/2018   Procedure: POLYPECTOMY;  Surgeon: Mansouraty, GTelford Nab, MD;  [facility];  Service: Gastroenterology;;   UPPER ESOPHAGEAL ENDOSCOPIC ULTRASOUND (EUS)  04/24/2018   Procedure: UPPER ESOPHAGEAL ENDOSCOPIC ULTRASOUND (EUS);  Surgeon: MRush LandmarkGTelford Nab, MD;  [facility];  Service: Gastroenterology;;   VESICOVAGINAL FISTULA CLOSURE W/ TAH     Family History  Problem Relation Age of Onset   Prostate cancer Father    Diabetes Mother        retinopathy/blind   Deep vein thrombosis Mother    Hypertension Mother    Healthy Sister    Colon cancer Neg Hx    Esophageal cancer Neg Hx    Stomach cancer Neg Hx    Rectal cancer Neg Hx    Social History   Socioeconomic History   Marital status: Married    Spouse name: RLorinda Copland  Number  of children: 0   Years of education: masters   Highest education level: Not on file  Occupational History   Occupation: record s[employer]  Tobacco Use   Smoking status: Never   Smokeless tobacco: Never  Vaping Use   Vaping Use: Never used  Substance and Sexual Activity   Alcohol use: Yes    Alcohol/week: 0.0 standard drinks of alcohol    Comment: occasional-wine    Drug use: No   Sexual activity: Not Currently  Other Topics Concern   Not on file  Social History Narrative   Lives with husband in a 2 story home with a basement.  Has no children.     Retired from the police department.     Education: masters (guidance and counseling).    Social Determinants of Health   Financial Resource Strain: Low Risk  (02/23/2022)   Overall Financial Resource Strain (CARDIA)    Difficulty of Paying Living Expenses: Not hard  at all  Food Insecurity: No Food Insecurity (02/23/2022)   Hunger Vital Sign    Worried About Running Out of Food in the Last Year: Never true    Ran Out of Food in the Last Year: Never true  Transportation Needs: No Transportation Needs (02/23/2022)   PRAPARE - Hydrologist (Medical): No    Lack of Transportation (Non-Medical): No  Physical Activity: Sufficiently Active (02/23/2022)   Exercise Vital Sign    Days of Exercise per Week: 3 days    Minutes of Exercise per Session: 60 min  Stress: Stress Concern Present (02/23/2022)   [place]    Feeling of Stress : Very much  Social Connections: Socially Integrated (02/23/2022)   Social Connection and Isolation Panel [NHANES]    Frequency of Communication with Friends and Family: More than three times a week    Frequency of Social Gatherings with Friends and Family: More than three times a week    Attends Religious Services: More than 4 times per year    Active Member of [employer] or Organizations: Yes     Attends Music therapist: More than 4 times per year    Marital Status: Married    Tobacco Counseling Counseling given: Not Answered   Clinical Intake:  Pre-visit preparation completed: Yes  Pain : No/denies pain     BMI - recorded: 28.15 (02/14/2022) Nutritional Risks: None Diabetes: No  How often do you need to have someone help you when you read instructions, pamphlets, or other written materials from your doctor or pharmacy?: 1 - Never What is the last grade level you completed in school?: HSG; MASTER'S DEGREE  Diabetic? No  Interpreter Needed?: No  Information entered by :: Lisette Abu, LPN.   Activities of Daily Living    02/23/2022   10:52 AM  In your present state of health, do you have any difficulty performing the following activities:  Hearing? 0  Vision? 0  Difficulty concentrating or making decisions? 0  Walking or climbing stairs? 0  Dressing or bathing? 0  Doing errands, shopping? 0  Preparing Food and eating ? N  Using the Toilet? N  In the past six months, have you accidently leaked urine? N  Do you have problems with loss of bowel control? N  Managing your Medications? N  Managing your Finances? N  Housekeeping or managing your Housekeeping? N    Patient Care Team: Janith Lima, MD as PCP - General (Internal Medicine) Al[place], Legrand Como, MD (Endocrinology) Ladene Artist, MD (Gastroenterology) Rosemary Holms, [place] (Podiatry) Rutherford Guys, MD (Ophthalmology) Carren Rang, Nadara Mustard, MD (Gynecology) Lavonna Monarch, MD (Inactive) as Consulting Physician (Dermatology)  Indicate any recent Medical Services you may have received from other than [facility] providers in the past year (date may be approximate).     Assessment:   This is a routine wellness examination for Katie [name].  Hearing/Vision screen Hearing Screening - Comments:: Denies hearing difficulties   Vision Screening - Comments:: Wears rx glasses - up to date with  routine eye exams with Rutherford Guys, MD.   Dietary issues and exercise activities discussed: Current Exercise Habits: Home exercise routine, Type of exercise: walking;treadmill;stretching;strength training/weights;exercise ball;calisthenics, Time (Minutes): 60, Frequency (Times/Week): 3, Weekly Exercise (Minutes/Week): 180, Intensity: Moderate, Exercise limited by: orthopedic condition(s)   Goals Addressed             This Visit's Progress    My goal is to stay  healthy and keep my right frame of mind.        Depression Screen    02/23/2022   10:55 AM 02/18/2021    4:12 PM 10/30/2019   10:04 AM 12/30/2018    4:25 PM 03/28/2018    9:45 AM 03/26/2017   11:38 AM 03/14/2016   11:05 AM  PHQ 2/9 Scores  PHQ - 2 Score 1 0 0 0 0 1 0  PHQ- 9 Score     2 2     Fall Risk    02/23/2022   10:52 AM 10/05/2021    2:40 PM 02/18/2021    4:14 PM 12/13/2020    9:26 AM 10/30/2019   10:03 AM  Fall Risk   Falls in the past year? 0 0 0 0 0  Comment  denies falls x 12 months; does not use assistive devices     Number falls in past yr: 0 0 0  0  Injury with Fall? 0 0 0  0  Risk for fall due to : No Fall Risks No Fall Risks No Fall Risks  Orthopedic patient  Follow up Falls prevention discussed Falls prevention discussed Falls evaluation completed  Falls evaluation completed    FALL RISK PREVENTION PERTAINING TO THE HOME:  Any stairs in or around the home? Yes  If so, are there any without handrails? No  Home free of loose throw rugs in walkways, pet beds, electrical cords, etc? Yes  Adequate lighting in your home to reduce risk of falls? Yes   ASSISTIVE DEVICES UTILIZED TO PREVENT FALLS:  Life alert? No  Use of a cane, walker or w/c? No  Grab bars in the bathroom? Yes  Shower chair or bench in shower? No  Elevated toilet seat or a handicapped toilet? Yes   TIMED UP AND GO:  Was the test performed? No . Phone Visit   Cognitive Function:    03/26/2017   11:52 AM  MMSE - Mini Mental  State Exam  Orientation to time 5  Orientation to Place 5  Registration 3  Attention/ Calculation 4  Recall 2  Language- name 2 objects 2  Language- repeat 1  Language- follow 3 step command 3  Language- read & follow direction 1  Write a sentence 1  Copy design 1  Total score 28        02/23/2022   10:54 AM 10/30/2019   10:05 AM  6CIT Screen  What Year? 0 points 0 points  What month? 0 points 0 points  What time? 0 points 0 points  Count back from 20 0 points 0 points  Months in reverse 0 points 0 points  Repeat phrase 0 points 0 points  Total Score 0 points 0 points    Immunizations Immunization History  Administered Date(s) Administered   Fluad Quad(high Dose 65+) 01/25/2021   Influenza Split 02/28/2012, 02/15/2013, 01/27/2014   Influenza Whole 02/13/2008, 04/07/2009   Influenza, High Dose Seasonal PF 01/29/2018, 02/02/2019   Influenza-Unspecified 03/01/2017, 01/28/2020, 01/24/2022   PFIZER(Purple Top)SARS-COV-2 Vaccination 06/21/2019, 07/16/2019, 03/27/2020   Pfizer Covid-19 Vaccine Bivalent Booster 56yr & up 04/18/2021   Pneumococcal Conjugate-13 08/11/2014   Pneumococcal Polysaccharide-23 05/31/2011, 06/18/2017   Tdap 03/16/2015   Zoster Recombinat (Shingrix) 01/26/2017, 05/09/2017    TDAP status: Up to date  Flu Vaccine status: Up to date  Pneumococcal vaccine status: Up to date  Covid-19 vaccine status: Completed vaccines  Qualifies for Shingles Vaccine? Yes   Zostavax completed No   Shingrix Completed?:  Yes  Screening Tests Health Maintenance  Topic Date Due   OPHTHALMOLOGY EXAM  12/03/2019   FOOT EXAM  04/30/2020   Diabetic kidney evaluation - Urine ACR  06/21/2022   HEMOGLOBIN A1C  08/16/2022   Diabetic kidney evaluation - GFR measurement  02/15/2023   TETANUS/TDAP  03/15/2025   COLONOSCOPY (Pts 45-69yr Insurance coverage will need to be confirmed)  01/29/2026   Pneumonia Vaccine 77 Years old  Completed   INFLUENZA VACCINE  Completed    DEXA SCAN  Completed   Hepatitis C Screening  Completed   Zoster Vaccines- Shingrix  Completed   HPV VACCINES  Aged Out   COVID-19 Vaccine  Discontinued    Health Maintenance  Health Maintenance Due  Topic Date Due   OPHTHALMOLOGY EXAM  12/03/2019   FOOT EXAM  04/30/2020    Colorectal cancer screening: Type of screening: Colonoscopy. Completed 01/30/2019. Repeat every 7 years  Mammogram status: Completed 06/06/2021. Repeat every year  Bone Density status: Completed 11/13/2016. Results reflect: Bone density results: NORMAL. Repeat every 5 years. (Completed by OB/GN)  Lung Cancer Screening: (Low Dose CT Chest recommended if Age 469-80years, 30 pack-year currently smoking OR have quit w/in 15years.) does not qualify.   Lung Cancer Screening Referral: no  Additional Screening:  Hepatitis C Screening: does qualify; Completed 09/20/2016  Vision Screening: Recommended annual ophthalmology exams for early detection of glaucoma and other disorders of the eye. Is the patient up to date with their annual eye exam?  Yes  Who is the provider or what is the name of the office in which the patient attends annual eye exams? MRutherford Guys MD. If pt is not established with a provider, would they like to be referred to a provider to establish care? No .   Dental Screening: Recommended annual dental exams for proper oral hygiene  Community Resource Referral / Chronic Care Management: CRR required this visit?  No   CCM required this visit?  No      Plan:     I have personally reviewed and noted the following in the patient's chart:   Medical and social history Use of alcohol, tobacco or illicit drugs  Current medications and supplements including opioid prescriptions. Patient is not currently taking opioid prescriptions. Functional ability and status Nutritional status Physical activity Advanced directives List of other physicians Hospitalizations, surgeries, and ER visits in previous 12  months Vitals Screenings to include cognitive, depression, and falls Referrals and appointments  In addition, I have reviewed and discussed with patient certain preventive protocols, quality metrics, and best practice recommendations. A written personalized care plan for preventive services as well as general preventive health recommendations were provided to patient.     SSheral Flow LPN   [id number]  Nurse Notes: N/A

## 2022-02-23 NOTE — Patient Instructions (Signed)
Katie [name] , Thank you for taking time to come for your Medicare Wellness Visit. I appreciate your ongoing commitment to your health goals. Please review the following plan we discussed and let me know if I can assist you in the future.   These are the goals we discussed:  Goals      My goal is to stay healthy and keep my right frame of mind.        This is a list of the screening recommended for you and due dates:  Health Maintenance  Topic Date Due   Eye exam for diabetics  12/03/2019   Complete foot exam   04/30/2020   Yearly kidney health urinalysis for diabetes  06/21/2022   Hemoglobin A1C  08/16/2022   Yearly kidney function blood test for diabetes  02/15/2023   Tetanus Vaccine  03/15/2025   Colon Cancer Screening  01/29/2026   Pneumonia Vaccine  Completed   Flu Shot  Completed   DEXA scan (bone density measurement)  Completed   Hepatitis C Screening: USPSTF Recommendation to screen - Ages 27-79 yo.  Completed   Zoster (Shingles) Vaccine  Completed   HPV Vaccine  Aged Out   COVID-19 Vaccine  Discontinued    Advanced directives: No; Advance directive discussed with you today. I have provided a copy for you to complete at home and have notarized. Once this is complete please bring a copy in to our office so we can scan it into your chart.  Conditions/risks identified: Yes  Next appointment: Follow up in one year for your annual wellness visit 02/26/2023 at 11:15 a.m. telephone visit with Mignon Pine, Nurse Health Advisor.  If you need to reschedule or cancel, please call [phone].   Preventive Care 12 Years and Older, Female Preventive care refers to lifestyle choices and visits with your health care provider that can promote health and wellness. What does preventive care include? A yearly physical exam. This is also called an annual well check. Dental exams once or twice a year. Routine eye exams. Ask your health care provider how often you should have your eyes  checked. Personal lifestyle choices, including: Daily care of your teeth and gums. Regular physical activity. Eating a healthy diet. Avoiding tobacco and drug use. Limiting alcohol use. Practicing safe sex. Taking low-dose aspirin every day. Taking vitamin and mineral supplements as recommended by your health care provider. What happens during an annual well check? The services and screenings done by your health care provider during your annual well check will depend on your age, overall health, lifestyle risk factors, and family history of disease. Counseling  Your health care provider may ask you questions about your: Alcohol use. Tobacco use. Drug use. Emotional well-being. Home and relationship well-being. Sexual activity. Eating habits. History of falls. Memory and ability to understand (cognition). Work and work Statistician. Reproductive health. Screening  You may have the following tests or measurements: Height, weight, and BMI. Blood pressure. Lipid and cholesterol levels. These may be checked every 5 years, or more frequently if you are over 6 years old. Skin check. Lung cancer screening. You may have this screening every year starting at age 55 if you have a 30-pack-year history of smoking and currently smoke or have quit within the past 15 years. Fecal occult blood test (FOBT) of the stool. You may have this test every year starting at age 4. Flexible sigmoidoscopy or colonoscopy. You may have a sigmoidoscopy every 5 years or a colonoscopy every 10 years starting  at age 17. Hepatitis C blood test. Hepatitis B blood test. Sexually transmitted disease (STD) testing. Diabetes screening. This is done by checking your blood sugar (glucose) after you have not eaten for a while (fasting). You may have this done every 1-3 years. Bone density scan. This is done to screen for osteoporosis. You may have this done starting at age 59. Mammogram. This may be done every 1-2  years. Talk to your health care provider about how often you should have regular mammograms. Talk with your health care provider about your test results, treatment options, and if necessary, the need for more tests. Vaccines  Your health care provider may recommend certain vaccines, such as: Influenza vaccine. This is recommended every year. Tetanus, diphtheria, and acellular pertussis (Tdap, Td) vaccine. You may need a Td booster every 10 years. Zoster vaccine. You may need this after age 52. Pneumococcal 13-valent conjugate (PCV13) vaccine. One dose is recommended after age 36. Pneumococcal polysaccharide (PPSV23) vaccine. One dose is recommended after age 55. Talk to your health care provider about which screenings and vaccines you need and how often you need them. This information is not intended to replace advice given to you by your health care provider. Make sure you discuss any questions you have with your health care provider. Document Released: 05/28/2015 Document Revised: 01/19/2016 Document Reviewed: 03/02/2015 Elsevier Interactive Patient Education  2017 [place] Prevention in the Home Falls can cause injuries. They can happen to people of all ages. There are many things you can do to make your home safe and to help prevent falls. What can I do on the outside of my home? Regularly fix the edges of walkways and driveways and fix any cracks. Remove anything that might make you trip as you walk through a door, such as a raised step or threshold. Trim any bushes or trees on the path to your home. Use bright outdoor lighting. Clear any walking paths of anything that might make someone trip, such as rocks or tools. Regularly check to see if handrails are loose or broken. Make sure that both sides of any steps have handrails. Any raised decks and porches should have guardrails on the edges. Have any leaves, snow, or ice cleared regularly. Use sand or salt on walking paths  during winter. Clean up any spills in your garage right away. This includes oil or grease spills. What can I do in the bathroom? Use night lights. Install grab bars by the toilet and in the tub and shower. Do not use towel bars as grab bars. Use non-skid mats or decals in the tub or shower. If you need to sit down in the shower, use a plastic, non-slip stool. Keep the floor dry. Clean up any water that spills on the floor as soon as it happens. Remove soap buildup in the tub or shower regularly. Attach bath mats securely with double-sided non-slip rug tape. Do not have throw rugs and other things on the floor that can make you trip. What can I do in the bedroom? Use night lights. Make sure that you have a light by your bed that is easy to reach. Do not use any sheets or blankets that are too big for your bed. They should not hang down onto the floor. Have a firm chair that has side arms. You can use this for support while you get dressed. Do not have throw rugs and other things on the floor that can make you trip. What can  I do in the kitchen? Clean up any spills right away. Avoid walking on wet floors. Keep items that you use a lot in easy-to-reach places. If you need to reach something above you, use a strong step stool that has a grab bar. Keep electrical cords out of the way. Do not use floor polish or wax that makes floors slippery. If you must use wax, use non-skid floor wax. Do not have throw rugs and other things on the floor that can make you trip. What can I do with my stairs? Do not leave any items on the stairs. Make sure that there are handrails on both sides of the stairs and use them. Fix handrails that are broken or loose. Make sure that handrails are as long as the stairways. Check any carpeting to make sure that it is firmly attached to the stairs. Fix any carpet that is loose or worn. Avoid having throw rugs at the top or bottom of the stairs. If you do have throw  rugs, attach them to the floor with carpet tape. Make sure that you have a light switch at the top of the stairs and the bottom of the stairs. If you do not have them, ask someone to add them for you. What else can I do to help prevent falls? Wear shoes that: Do not have high heels. Have rubber bottoms. Are comfortable and fit you well. Are closed at the toe. Do not wear sandals. If you use a stepladder: Make sure that it is fully opened. Do not climb a closed stepladder. Make sure that both sides of the stepladder are locked into place. Ask someone to hold it for you, if possible. Clearly mark and make sure that you can see: Any grab bars or handrails. First and last steps. Where the edge of each step is. Use tools that help you move around (mobility aids) if they are needed. These include: Canes. Walkers. Scooters. Crutches. Turn on the lights when you go into a dark area. Replace any light bulbs as soon as they burn out. Set up your furniture so you have a clear path. Avoid moving your furniture around. If any of your floors are uneven, fix them. If there are any pets around you, be aware of where they are. Review your medicines with your doctor. Some medicines can make you feel dizzy. This can increase your chance of falling. Ask your doctor what other things that you can do to help prevent falls. This information is not intended to replace advice given to you by your health care provider. Make sure you discuss any questions you have with your health care provider. Document Released: 02/25/2009 Document Revised: 10/07/2015 Document Reviewed: 06/05/2014 Elsevier Interactive Patient Education  2017 [employer].

## 2022-02-27 ENCOUNTER — Ambulatory Visit (HOSPITAL_COMMUNITY)
Admission: RE | Admit: 2022-02-27 | Discharge: 2022-02-27 | Disposition: A | Discharge status: Home / Self Care | Payer: Medicare Other | Source: Ambulatory Visit | Attending: Cardiology | Admitting: Cardiology

## 2022-02-27 ENCOUNTER — Other Ambulatory Visit: Payer: Self-pay | Admitting: Internal Medicine

## 2022-02-27 DIAGNOSIS — R202 Paresthesia of skin: Secondary | ICD-10-CM

## 2022-02-27 DIAGNOSIS — R209 Unspecified disturbances of skin sensation: Secondary | ICD-10-CM

## 2022-02-27 DIAGNOSIS — R200 Anesthesia of skin: Secondary | ICD-10-CM | POA: Diagnosis not present

## 2022-03-15 ENCOUNTER — Other Ambulatory Visit: Payer: Self-pay | Admitting: Cardiology

## 2022-03-15 DIAGNOSIS — I429 Cardiomyopathy, unspecified: Secondary | ICD-10-CM

## 2022-03-28 ENCOUNTER — Telehealth: Payer: Self-pay | Admitting: Internal Medicine

## 2022-03-28 NOTE — Telephone Encounter (Signed)
Pt has been informed that she can proceed with RSV ad COVID if she would like. CMA has stated that she could get them at the same time or one at a time. It would be her preference. Pt expressed understanding.

## 2022-03-28 NOTE — Telephone Encounter (Signed)
Pt calling for herself and her husband. Please ask PCP if they can get the RSV & COVID vaccine on the same day/ at the same time?   Please call pt [phone]. *if no answer, please leave detail message saying yes they can or no they cannot & any instructions.

## 2022-03-29 LAB — HM DIABETES EYE EXAM

## 2022-04-15 ENCOUNTER — Other Ambulatory Visit: Payer: Self-pay | Admitting: Internal Medicine

## 2022-04-15 DIAGNOSIS — E538 Deficiency of other specified B group vitamins: Secondary | ICD-10-CM

## 2022-04-21 ENCOUNTER — Other Ambulatory Visit: Payer: Self-pay | Admitting: Internal Medicine

## 2022-04-21 DIAGNOSIS — F411 Generalized anxiety disorder: Secondary | ICD-10-CM

## 2022-05-23 ENCOUNTER — Other Ambulatory Visit: Payer: Self-pay | Admitting: Neurosurgery

## 2022-05-23 ENCOUNTER — Ambulatory Visit: Payer: Medicare Other | Admitting: Cardiology

## 2022-05-23 DIAGNOSIS — M4316 Spondylolisthesis, lumbar region: Secondary | ICD-10-CM

## 2022-05-26 ENCOUNTER — Encounter: Payer: Self-pay | Admitting: Cardiology

## 2022-05-26 ENCOUNTER — Ambulatory Visit: Payer: Medicare Other | Admitting: Cardiology

## 2022-05-26 VITALS — BP 135/80 | HR 86 | Resp 16 | Ht 65.0 in | Wt 163.6 lb

## 2022-05-26 DIAGNOSIS — I10 Essential (primary) hypertension: Secondary | ICD-10-CM

## 2022-05-26 DIAGNOSIS — I340 Nonrheumatic mitral (valve) insufficiency: Secondary | ICD-10-CM

## 2022-05-26 DIAGNOSIS — I429 Cardiomyopathy, unspecified: Secondary | ICD-10-CM

## 2022-05-26 DIAGNOSIS — I361 Nonrheumatic tricuspid (valve) insufficiency: Secondary | ICD-10-CM

## 2022-05-26 DIAGNOSIS — E785 Hyperlipidemia, unspecified: Secondary | ICD-10-CM

## 2022-05-26 NOTE — Progress Notes (Signed)
IDRosibel [name], DOB [birth year], MRN [id number]  PCP:  Janith Lima, MD  Cardiologist:  Rex Kras, DO, [facility] (established care 06/03/2020)  Date: 05/26/22 Last Office Visit: 11/17/2021  Chief Complaint  Patient presents with   Cardiomyopathy   Follow-up    6 month    HPI  Katie [name] is a 77 y.o. female whose past medical history and cardiovascular risk factors include: Cardiomyopathy, History of DVT, GERD, hypertension chronic kidney disease stage, acquired hypothyroidism, hyperlipidemia, prediabetes, advanced age, postmenopausal female.  Patient was being followed by the practice for underlying valvular heart disease.  However had a repeat echocardiogram which noted mildly reduced LVEF, grade 1 diastolic impairment, elevated left atrial pressures and valvular heart disease.  Given the reduction in LVEF she was started on Entresto with the intentions of uptitration of GDMT.  She had undergone a myocardial perfusion study in the recent past which was reported to be low risk.  Given the reduction in LVEF we discussed undergoing coronary CTA versus angiography to rule out an ischemic substrate.  However, since she was asymptomatic the shared decision was to hold off on additional testing and follow her clinically.  She presents today for 39-monthfollow-up visit.  She denies anginal discomfort.  Her shortness of breath is well-controlled.  Home blood pressures range between 120-130 mmHg on current regimen.  Increased stress factors at home with her husband being sick requiring more one-to-one attention.  FUNCTIONAL STATUS: Prior to the pandemic she would regularly go to gym. No structured exercise program or daily routine.   ALLERGIES: Allergies  Allergen Reactions   Lisinopril Other (See Comments) and Cough    Cough and throat clearing   Metoprolol Succinate Other (See Comments)    Unknown reaction   Penicillins Swelling    Has patient had a PCN reaction causing immediate rash,  facial/tongue/throat swelling, SOB or lightheadedness with hypotension: Yes Has patient had a PCN reaction causing severe rash involving mucus membranes or skin necrosis: No Has patient had a PCN reaction that required hospitalization: No Has patient had a PCN reaction occurring within the last 10 years: No If all of the above answers are "NO", then may proceed with Cephalosporin use.    Sulfamethoxazole Other (See Comments)    Unknown reaction   Sulfamethoxazole-Trimethoprim Other (See Comments)    Unknown reaction   Trimethoprim Other (See Comments)    Unknown reaction   Metformin Nausea Only    MEDICATION LIST PRIOR TO VISIT: Current Meds  Medication Sig   ACCU-CHEK AVIVA PLUS test strip    allopurinol (ZYLOPRIM) 100 MG tablet Take 1 tablet (100 mg total) by mouth daily.   ALPRAZolam (XANAX) 0.5 MG tablet TAKE 1 TABLET BY MOUTH 3 TIMES  DAILY AS NEEDED FOR ANXIETY OR  SLEEP   aspirin 81 MG chewable tablet Chew 81 mg by mouth every morning.   cyanocobalamin (VITAMIN B12) 1000 MCG/ML injection INJECT 1 ML  ONCE EVERY MONTH   Digestive Enzymes (DIGESTIVE ENZYME PO) Take 2 capsules by mouth every morning.   ENTRESTO 24-26 MG Take 1 tablet by mouth twice daily   famotidine (PEPCID) 20 MG tablet Take 2 tablets by mouth once daily   hydrALAZINE (APRESOLINE) 25 MG tablet Take 1 tablet (25 mg total) by mouth in the morning and at bedtime.   Lancet Devices (SIMPLE DIAGNOSTICS LANCING DEV) MISC    levothyroxine (SYNTHROID, LEVOTHROID) 88 MCG tablet Take 88 mcg by mouth daily before breakfast.   magnesium hydroxide (DULCOLAX)  400 MG/5ML suspension Take 15 mLs by mouth daily as needed for mild constipation.   Polyethyl Glycol-Propyl Glycol (SYSTANE) 0.4-0.3 % SOLN Place 1 drop into both eyes daily as needed (dry eyes).   rosuvastatin (CRESTOR) 10 MG tablet Take 10 mg by mouth every morning.   vitamin E 180 MG (400 UNITS) capsule Take 400 Units by mouth every morning.     PAST MEDICAL  HISTORY: Past Medical History:  Diagnosis Date   Acute venous embolism and thrombosis of unspecified deep vessels of lower extremity 2001   RLE DVT and PE, coumadin x 61mo now ASSA   Anxiety state, unspecified    Chronic idiopathic constipation 11/02/2016   Degeneration of cervical intervertebral disc    Gallstones    History of DVT (deep vein thrombosis)    Iron deficiency anemia secondary to blood loss (chronic)    Irritable bowel syndrome    Lumbago    Other B-complex deficiencies    Personal history of colonic polyps    adenomatous   Pure hypercholesterolemia    Reflux esophagitis    Tubular adenoma of colon 09/2002   Type II or unspecified type diabetes mellitus without mention of complication, not stated as uncontrolled    Unspecified essential hypertension    Unspecified hypothyroidism    Unspecified iridocyclitis    Unspecified venous (peripheral) insufficiency    Unspecified vitamin D deficiency     PAST SURGICAL HISTORY: Past Surgical History:  Procedure Laterality Date   BIOPSY  04/24/2018   Procedure: BIOPSY;  Surgeon: MIrving Copas, MD;  [facility];  Service: Gastroenterology;;   CWilmon PaliRELEASE     CATARACT EXTRACTION Bilateral 08/29/2017,09/19/17   ESOPHAGOGASTRODUODENOSCOPY (EGD) WITH PROPOFOL N/A 04/24/2018   Procedure: ESOPHAGOGASTRODUODENOSCOPY (EGD) WITH PROPOFOL;  Surgeon: MIrving Copas, MD;  [facility];  Service: Gastroenterology;  Laterality: N/A;  with EMR   FEMORAL HERNIA REPAIR     OOPHORECTOMY     POLYPECTOMY  04/24/2018   Procedure: POLYPECTOMY;  Surgeon: Mansouraty, GTelford Nab, MD;  [facility];  Service: Gastroenterology;;   UPPER ESOPHAGEAL ENDOSCOPIC ULTRASOUND (EUS)  04/24/2018   Procedure: UPPER ESOPHAGEAL ENDOSCOPIC ULTRASOUND (EUS);  Surgeon: MRush LandmarkGTelford Nab, MD;  [facility];  Service: Gastroenterology;;   VESICOVAGINAL FISTULA CLOSURE W/ TAH      FAMILY  HISTORY: The patient family history includes Deep vein thrombosis in her mother; Diabetes in her mother; Healthy in her sister; Hypertension in her mother; Prostate cancer in her father.  SOCIAL HISTORY:  The patient  reports that she has never smoked. She has never used smokeless tobacco. She reports current alcohol use. She reports that she does not use drugs.  REVIEW OF SYSTEMS: Review of Systems  Cardiovascular:  Negative for chest pain, dyspnea on exertion, leg swelling, orthopnea, palpitations, paroxysmal nocturnal dyspnea and syncope.  Respiratory:  Negative for shortness of breath.     PHYSICAL EXAM:    05/26/2022   11:51 AM 02/14/2022   10:04 AM 11/17/2021   11:25 AM  Vitals with BMI  Height '5\' 5"'$  '5\' 4"'$  '5\' 4"'$   Weight 163 lbs 10 oz 164 lbs 160 lbs 10 oz  BMI 27.22 [id number] Systolic [id number] Diastolic 80 64 77  Pulse 86 62 89   Physical Exam  Constitutional: No distress.  Age appropriate, hemodynamically stable.   Eyes:  Arcus senilis  Neck: No JVD present.  Cardiovascular: Normal rate, regular rhythm, S1 normal, S2 normal, intact distal pulses  and normal pulses. Exam reveals no gallop, no S3 and no S4.  Murmur heard. Holosystolic murmur is present with a grade of 3/6 at the apex radiating to the axilla. Pulmonary/Chest: Effort normal and breath sounds normal. No stridor. She has no wheezes. She has no rales.  Abdominal: Soft. Bowel sounds are normal. She exhibits no distension. There is no abdominal tenderness.  Musculoskeletal:        General: No edema.     Cervical back: Neck supple.  Neurological: She is alert and oriented to person, place, and time. She has intact cranial nerves (2-12).  Skin: Skin is warm and moist.   CARDIAC DATABASE: EKG: 05/26/2022: Sinus rhythm, 93 bpm, LVH per voltage criteria without ST-T changes, LAE.  Echocardiogram: 07/27/2021: Mildly depressed LV systolic function with visual EF 45-50%. Left ventricle cavity is mildly  dilated. Mild left ventricular hypertrophy. Normal global wall motion. Doppler evidence of grade I (impaired) diastolic dysfunction, elevated LAP. Moderate (Grade II) mitral regurgitation. Mild tricuspid regurgitation. No evidence of pulmonary hypertension. Moderate pulmonic regurgitation. Compared to study 06/09/2020: LVEF reduced from 59% to 45-50%, elevated LAP and moderate PR/MR are new findings, otherwise no significant change.  Stress Testing: Exercise tetrofosmin stress test  06/23/2020: Normal ECG stress. The patient exercised for 3 minutes and 33 seconds of a Bruce protocol, achieving 5.28 METs.   No chest pian, fatigue led to termination of stress. Normal BP response.   Myocardial perfusion reveals mild breast attenuation noted in the inferoseptal region. No ischemia or scar. Overall LV systolic function is abnormal without regional wall motion abnormalities. Stress LV EF: 42%.  Visually the LVEF appears normal. No previous exam available for comparison. Low risk.  Heart Catheterization: None  LABORATORY DATA:    Latest Ref Rng & Units 02/14/2022   10:37 AM 09/28/2021   10:12 AM 08/15/2021    2:06 PM  CBC  WBC 4.0 - 10.5 K/uL 3.7  3.9  4.3   Hemoglobin 12.0 - 15.0 g/dL 13.2  13.7  13.5   Hematocrit 36.0 - 46.0 % 38.5  40.4  39.7   Platelets 150.0 - 400.0 K/uL 185.0  174  203.0        Latest Ref Rng & Units 02/14/2022   10:37 AM 12/15/2021    2:26 PM 11/17/2021   12:17 PM  CMP  Glucose 70 - 99 mg/dL 85  92  103   BUN 6 - 23 mg/dL '9  17  14   '$ Creatinine 0.40 - 1.20 mg/dL 1.15  1.38  1.34   Sodium 135 - 145 mEq/L 138  143  138   Potassium 3.5 - 5.1 mEq/L 5.1  4.3  5.1   Chloride 96 - 112 mEq/L 104  103  101   CO2 19 - 32 mEq/L '28  28  24   '$ Calcium 8.4 - 10.5 mg/dL 9.7  10.2  10.2     Lipid Panel  Lab Results  Component Value Date   CHOL 126 08/15/2021   HDL 82.70 08/15/2021   LDLCALC 33 08/15/2021   LDLDIRECT 105.9 07/04/2007   TRIG 49.0 08/15/2021   CHOLHDL 2  08/15/2021    No components found for: "NTPROBNP" Recent Labs    11/17/21 1217 12/15/21 1426  PROBNP 755* 497   Recent Labs    08/15/21 1406 02/14/22 1037  TSH 4.41 2.69   HEMOGLOBIN A1C Lab Results  Component Value Date   HGBA1C 5.3 02/14/2022    IMPRESSION:    ICD-10-CM  1. Cardiomyopathy, unspecified type ([place])  I42.9 EKG 12-Lead    PCV ECHOCARDIOGRAM COMPLETE    2. Essential hypertension  I10     3. Hyperlipidemia  E78.5     4. Nonrheumatic mitral valve regurgitation  I34.0     5. Nonrheumatic tricuspid valve regurgitation  I36.1        RECOMMENDATIONS: Katie [name] is a 77 y.o. female whose past medical history and cardiac risk factors include: Newly discovered cardiomyopathy, History of DVT, GERD, hypertension chronic kidney disease stage , acquired hypothyroidism, hyperlipidemia, prediabetes, advanced age, postmenopausal female.  Cardiomyopathy Etiology unspecified.. Compensated. Tolerated the initiation of Entresto 24/26 mg p.o. twice daily well without any side effects or intolerances. Discussed uptitration of Entresto to 49/51 mg p.o. twice daily versus addition of [place].  However, patient states that she is overall hemodynamically stable and just had a refill of hydralazine.  She does not want to uptitrate GDMT at this time.  The shared decision was to repeat an echocardiogram prior to the next office visit and if the LVEF is still reduced we will continue to uptitrate GDMT otherwise continue her medical therapy as is.  Patient is agreeable with the plan of care.  Echo March 2023: LVEF 45-50%, elevated left atrial pressure, and grade 1 diastolic impairment, moderate MR/TR.   He did not tolerate isosorbide dinitrate in the past due to headaches.    Provided the patient with samples for [facility] and patient assistance form as well.  Essential hypertension Office blood pressures are within acceptable limits. Home blood pressure very well  controlled. Continue current medical therapy.  Hyperlipidemia Currently on Crestor.   She denies myalgia or other side effects. Currently managed by primary care provider.  FINAL MEDICATION LIST END OF ENCOUNTER: No orders of the defined types were placed in this encounter.   There are no discontinued medications.    Current Outpatient Medications:    ACCU-CHEK AVIVA PLUS test strip, , Disp: , Rfl:    allopurinol (ZYLOPRIM) 100 MG tablet, Take 1 tablet (100 mg total) by mouth daily., Disp: 90 tablet, Rfl: 1   ALPRAZolam (XANAX) 0.5 MG tablet, TAKE 1 TABLET BY MOUTH 3 TIMES  DAILY AS NEEDED FOR ANXIETY OR  SLEEP, Disp: 270 tablet, Rfl: 1   aspirin 81 MG chewable tablet, Chew 81 mg by mouth every morning., Disp: , Rfl:    cyanocobalamin (VITAMIN B12) 1000 MCG/ML injection, INJECT 1 ML  ONCE EVERY MONTH, Disp: 3 mL, Rfl: 0   Digestive Enzymes (DIGESTIVE ENZYME PO), Take 2 capsules by mouth every morning., Disp: , Rfl:    ENTRESTO 24-26 MG, Take 1 tablet by mouth twice daily, Disp: 60 tablet, Rfl: 0   famotidine (PEPCID) 20 MG tablet, Take 2 tablets by mouth once daily, Disp: 180 tablet, Rfl: 0   hydrALAZINE (APRESOLINE) 25 MG tablet, Take 1 tablet (25 mg total) by mouth in the morning and at bedtime., Disp: 270 tablet, Rfl: 3   Lancet Devices (SIMPLE DIAGNOSTICS LANCING DEV) MISC, , Disp: , Rfl:    levothyroxine (SYNTHROID, LEVOTHROID) 88 MCG tablet, Take 88 mcg by mouth daily before breakfast., Disp: , Rfl:    magnesium hydroxide (DULCOLAX) 400 MG/5ML suspension, Take 15 mLs by mouth daily as needed for mild constipation., Disp: , Rfl:    Polyethyl Glycol-Propyl Glycol (SYSTANE) 0.4-0.3 % SOLN, Place 1 drop into both eyes daily as needed (dry eyes)., Disp: , Rfl:    rosuvastatin (CRESTOR) 10 MG tablet, Take 10 mg by mouth every morning.,  Disp: , Rfl:    vitamin E 180 MG (400 UNITS) capsule, Take 400 Units by mouth every morning., Disp: , Rfl:   Orders Placed This Encounter  Procedures    EKG 12-Lead   PCV ECHOCARDIOGRAM COMPLETE    There are no Patient Instructions on file for this visit.   --Continue cardiac medications as reconciled in final medication list. --Return in about 11 weeks (around 08/11/2022) for Follow up cardiomyopathy. Or sooner if needed. --Continue follow-up with your primary care physician regarding the management of your other chronic comorbid conditions.  Patient's questions and concerns were addressed to her satisfaction. She voices understanding of the instructions provided during this encounter.   This note was created using a voice recognition software as a result there may be grammatical errors inadvertently enclosed that do not reflect the nature of this encounter. Every attempt is made to correct such errors.  Rex Kras, [place], [facility]  Pager: [phone] Office: [phone]

## 2022-06-03 ENCOUNTER — Other Ambulatory Visit: Payer: Self-pay | Admitting: Internal Medicine

## 2022-06-03 DIAGNOSIS — K2100 Gastro-esophageal reflux disease with esophagitis, without bleeding: Secondary | ICD-10-CM

## 2022-06-11 ENCOUNTER — Ambulatory Visit
Admission: RE | Admit: 2022-06-11 | Discharge: 2022-06-11 | Disposition: A | Discharge status: Home / Self Care | Payer: Medicare Other | Source: Ambulatory Visit | Attending: Neurosurgery | Admitting: Neurosurgery

## 2022-06-11 DIAGNOSIS — M4316 Spondylolisthesis, lumbar region: Secondary | ICD-10-CM

## 2022-06-12 LAB — HM MAMMOGRAPHY

## 2022-07-01 ENCOUNTER — Other Ambulatory Visit: Payer: Self-pay | Admitting: Internal Medicine

## 2022-07-01 DIAGNOSIS — E538 Deficiency of other specified B group vitamins: Secondary | ICD-10-CM

## 2022-07-14 ENCOUNTER — Ambulatory Visit: Payer: Medicare Other | Admitting: Cardiology

## 2022-07-24 ENCOUNTER — Other Ambulatory Visit: Payer: Medicare Other

## 2022-07-28 ENCOUNTER — Ambulatory Visit: Payer: Medicare Other | Admitting: Cardiology

## 2022-08-02 LAB — LAB REPORT - SCANNED
Albumin, Urine POC: 32.1
Creatinine, POC: 226.3 mg/dL
EGFR: 44
Microalb Creat Ratio: 14

## 2022-08-07 ENCOUNTER — Encounter: Payer: Self-pay | Admitting: Cardiology

## 2022-08-07 ENCOUNTER — Ambulatory Visit: Payer: Medicare Other | Admitting: Cardiology

## 2022-08-07 VITALS — BP 135/77 | HR 84 | Resp 17 | Ht 65.0 in | Wt 164.2 lb

## 2022-08-07 DIAGNOSIS — I340 Nonrheumatic mitral (valve) insufficiency: Secondary | ICD-10-CM

## 2022-08-07 DIAGNOSIS — I429 Cardiomyopathy, unspecified: Secondary | ICD-10-CM

## 2022-08-07 DIAGNOSIS — I10 Essential (primary) hypertension: Secondary | ICD-10-CM

## 2022-08-07 DIAGNOSIS — E785 Hyperlipidemia, unspecified: Secondary | ICD-10-CM

## 2022-08-07 DIAGNOSIS — I361 Nonrheumatic tricuspid (valve) insufficiency: Secondary | ICD-10-CM

## 2022-08-07 NOTE — Progress Notes (Signed)
Katie [name], DOB April 24, 1946, MRN [id number]  PCP:  Janith Lima, MD  Cardiologist:  Rex Kras, DO, [facility] (established care 06/03/2020)  Date: 08/07/22 Last Office Visit: 05/26/2022  Chief Complaint  Patient presents with   Cardiomyopathy   Follow-up    1 year    HPI  Katie [name] is a 77 y.o. female whose past medical history and cardiovascular risk factors include: Cardiomyopathy, History of DVT, GERD, hypertension chronic kidney disease stage, acquired hypothyroidism, hyperlipidemia, prediabetes, advanced age, postmenopausal female.  Patient was being followed by the practice for valvular heart disease.  However repeat echocardiogram noted mildly reduced LVEF with grade 1 diastolic impairment and elevated left atrial pressures.  Given the change in LVEF we discussed undergoing ischemic workup for reevaluation of ischemic substrate.  However, since she was asymptomatic she wanted to hold off additional testing such as MPI or coronary CTA and GDMT was uptitrated.  She presents today for 25-month follow-up visit.  Home blood pressures are well-controlled.  She denies chest pain.  Shortness of breath with effort related activity/overexertion is still present but well-controlled.  Patient states that she wants to focus on taking care of herself however is the primary caregiver for her husband who requires one-on-one attention.    FUNCTIONAL STATUS: Prior to the pandemic she would regularly go to gym. No structured exercise program or daily routine.   ALLERGIES: Allergies  Allergen Reactions   Lisinopril Other (See Comments) and Cough    Cough and throat clearing   Metoprolol Succinate Other (See Comments)    Unknown reaction   Penicillins Swelling    Has patient had a PCN reaction causing immediate rash, facial/tongue/throat swelling, SOB or lightheadedness with hypotension: Yes Has patient had a PCN reaction causing severe rash involving mucus membranes or skin necrosis: No Has  patient had a PCN reaction that required hospitalization: No Has patient had a PCN reaction occurring within the last 10 years: No If all of the above answers are "NO", then may proceed with Cephalosporin use.    Sulfamethoxazole Other (See Comments)    Unknown reaction   Sulfamethoxazole-Trimethoprim Other (See Comments)    Unknown reaction   Trimethoprim Other (See Comments)    Unknown reaction   Metformin Nausea Only    MEDICATION LIST PRIOR TO VISIT: Current Meds  Medication Sig   ACCU-CHEK AVIVA PLUS test strip    allopurinol (ZYLOPRIM) 100 MG tablet Take 1 tablet (100 mg total) by mouth daily. (Patient taking differently: Take 100 mg by mouth daily. Patient taking every other day)   ALPRAZolam (XANAX) 0.5 MG tablet TAKE 1 TABLET BY MOUTH 3 TIMES  DAILY AS NEEDED FOR ANXIETY OR  SLEEP   aspirin 81 MG chewable tablet Chew 81 mg by mouth every morning.   cyanocobalamin (VITAMIN B12) 1000 MCG/ML injection INJECT 1 ML INTO THE MUSCLE  ONCE EVERY MONTH   Digestive Enzymes (DIGESTIVE ENZYME PO) Take 2 capsules by mouth every morning.   ENTRESTO 24-26 MG Take 1 tablet by mouth twice daily   famotidine (PEPCID) 20 MG tablet Take 2 tablets by mouth once daily   hydrALAZINE (APRESOLINE) 25 MG tablet Take 1 tablet (25 mg total) by mouth in the morning and at bedtime.   Lancet Devices (SIMPLE DIAGNOSTICS LANCING DEV) MISC    levothyroxine (SYNTHROID, LEVOTHROID) 88 MCG tablet Take 88 mcg by mouth daily before breakfast.   magnesium hydroxide (DULCOLAX) 400 MG/5ML suspension Take 15 mLs by mouth daily as needed for mild  constipation.   Polyethyl Glycol-Propyl Glycol (SYSTANE) 0.4-0.3 % SOLN Place 1 drop into both eyes daily as needed (dry eyes).   rosuvastatin (CRESTOR) 10 MG tablet Take 10 mg by mouth every morning.   vitamin E 180 MG (400 UNITS) capsule Take 400 Units by mouth every morning.     PAST MEDICAL HISTORY: Past Medical History:  Diagnosis Date   Acute venous embolism and  thrombosis of unspecified deep vessels of lower extremity 2001   RLE DVT and PE, coumadin x 41mo, now ASSA   Anxiety state, unspecified    Chronic idiopathic constipation 11/02/2016   Degeneration of cervical intervertebral disc    Gallstones    History of DVT (deep vein thrombosis)    Iron deficiency anemia secondary to blood loss (chronic)    Irritable bowel syndrome    Lumbago    Other B-complex deficiencies    Personal history of colonic polyps    adenomatous   Pure hypercholesterolemia    Reflux esophagitis    Tubular adenoma of colon 09/2002   Type II or unspecified type diabetes mellitus without mention of complication, not stated as uncontrolled    Unspecified essential hypertension    Unspecified hypothyroidism    Unspecified iridocyclitis    Unspecified venous (peripheral) insufficiency    Unspecified vitamin D deficiency     PAST SURGICAL HISTORY: Past Surgical History:  Procedure Laterality Date   BIOPSY  04/24/2018   Procedure: BIOPSY;  Surgeon: Irving Copas., MD;  Location: [facility];  Service: Gastroenterology;;   Wilmon Pali RELEASE     CATARACT EXTRACTION Bilateral 08/29/2017,09/19/17   ESOPHAGOGASTRODUODENOSCOPY (EGD) WITH PROPOFOL N/A 04/24/2018   Procedure: ESOPHAGOGASTRODUODENOSCOPY (EGD) WITH PROPOFOL;  Surgeon: Irving Copas., MD;  Location: [facility];  Service: Gastroenterology;  Laterality: N/A;  with EMR   FEMORAL HERNIA REPAIR     OOPHORECTOMY     POLYPECTOMY  04/24/2018   Procedure: POLYPECTOMY;  Surgeon: Mansouraty, Telford Nab., MD;  Location: [facility];  Service: Gastroenterology;;   UPPER ESOPHAGEAL ENDOSCOPIC ULTRASOUND (EUS)  04/24/2018   Procedure: UPPER ESOPHAGEAL ENDOSCOPIC ULTRASOUND (EUS);  Surgeon: Rush Landmark Telford Nab., MD;  Location: [facility];  Service: Gastroenterology;;   VESICOVAGINAL FISTULA CLOSURE W/ TAH      FAMILY HISTORY: The patient family history includes Deep vein thrombosis in her mother;  Diabetes in her mother; Healthy in her sister; Hypertension in her mother; Prostate cancer in her father.  SOCIAL HISTORY:  The patient  reports that she has never smoked. She has never used smokeless tobacco. She reports current alcohol use. She reports that she does not use drugs.  REVIEW OF SYSTEMS: Review of Systems  Cardiovascular:  Negative for chest pain, dyspnea on exertion, leg swelling, orthopnea, palpitations, paroxysmal nocturnal dyspnea and syncope.  Respiratory:  Negative for shortness of breath.     PHYSICAL EXAM:    08/07/2022   11:01 AM 05/26/2022   11:51 AM 02/14/2022   10:04 AM  Vitals with BMI  Height 5\' 5"  5\' 5"  5\' 4"   Weight 164 lbs 3 oz 163 lbs 10 oz 164 lbs  BMI 27.32 [id number]  Systolic [id number]  Diastolic 77 80 64  Pulse 84 86 62   Physical Exam  Constitutional: No distress.  Age appropriate, hemodynamically stable.   Eyes:  Arcus senilis  Neck: No JVD present.  Cardiovascular: Normal rate, regular rhythm, S1 normal, S2 normal, intact distal pulses and normal pulses. Exam reveals no gallop, no S3 and no S4.  Murmur heard. Holosystolic murmur is present with a grade of 3/6 at the apex radiating to the axilla. Pulmonary/Chest: Effort normal and breath sounds normal. No stridor. She has no wheezes. She has no rales.  Abdominal: Soft. Bowel sounds are normal. She exhibits no distension. There is no abdominal tenderness.  Musculoskeletal:        General: No edema.     Cervical back: Neck supple.  Neurological: She is alert and oriented to person, place, and time. She has intact cranial nerves (2-12).  Skin: Skin is warm and moist.   CARDIAC DATABASE: EKG: 05/26/2022: Sinus rhythm, 93 bpm, LVH per voltage criteria without ST-T changes, LAE.  Echocardiogram: 07/27/2021: Mildly depressed LV systolic function with visual EF 45-50%. Left ventricle cavity is mildly dilated. Mild left ventricular hypertrophy. Normal global wall motion. Doppler evidence  of grade I (impaired) diastolic dysfunction, elevated LAP. Moderate (Grade II) mitral regurgitation. Mild tricuspid regurgitation. No evidence of pulmonary hypertension. Moderate pulmonic regurgitation. Compared to study 06/09/2020: LVEF reduced from 59% to 45-50%, elevated LAP and moderate PR/MR are new findings, otherwise no significant change.  Stress Testing: Exercise tetrofosmin stress test  06/23/2020: Normal ECG stress. The patient exercised for 3 minutes and 33 seconds of a Bruce protocol, achieving 5.28 METs.   No chest pian, fatigue led to termination of stress. Normal BP response.   Myocardial perfusion reveals mild breast attenuation noted in the inferoseptal region. No ischemia or scar. Overall LV systolic function is abnormal without regional wall motion abnormalities. Stress LV EF: 42%.  Visually the LVEF appears normal. No previous exam available for comparison. Low risk.  Heart Catheterization: None  LABORATORY DATA:    Latest Ref Rng & Units 02/14/2022   10:37 AM 09/28/2021   10:12 AM 08/15/2021    2:06 PM  CBC  WBC 4.0 - 10.5 K/uL 3.7  3.9  4.3   Hemoglobin 12.0 - 15.0 g/dL 13.2  13.7  13.5   Hematocrit 36.0 - 46.0 % 38.5  40.4  39.7   Platelets 150.0 - 400.0 K/uL 185.0  174  203.0        Latest Ref Rng & Units 02/14/2022   10:37 AM 12/15/2021    2:26 PM 11/17/2021   12:17 PM  CMP  Glucose 70 - 99 mg/dL 85  92  103   BUN 6 - 23 mg/dL 9  17  14    Creatinine 0.40 - 1.20 mg/dL 1.15  1.38  1.34   Sodium 135 - 145 mEq/L 138  143  138   Potassium 3.5 - 5.1 mEq/L 5.1  4.3  5.1   Chloride 96 - 112 mEq/L 104  103  101   CO2 19 - 32 mEq/L 28  28  24    Calcium 8.4 - 10.5 mg/dL 9.7  10.2  10.2     Lipid Panel  Lab Results  Component Value Date   CHOL 126 08/15/2021   HDL 82.70 08/15/2021   LDLCALC 33 08/15/2021   LDLDIRECT 105.9 07/04/2007   TRIG 49.0 08/15/2021   CHOLHDL 2 08/15/2021    No components found for: "NTPROBNP" Recent Labs    11/17/21 1217  12/15/21 1426  PROBNP 755* 497   Recent Labs    08/15/21 1406 02/14/22 1037  TSH 4.41 2.69   HEMOGLOBIN A1C Lab Results  Component Value Date   HGBA1C 5.3 02/14/2022    IMPRESSION:    ICD-10-CM   1. Cardiomyopathy, unspecified type ([place])  I42.9     2. Essential  hypertension  I10     3. Hyperlipidemia  E78.5     4. Nonrheumatic mitral valve regurgitation  I34.0     5. Nonrheumatic tricuspid valve regurgitation  I36.1        RECOMMENDATIONS: Katie [name] is a 77 y.o. female whose past medical history and cardiac risk factors include: Newly discovered cardiomyopathy, History of DVT, GERD, hypertension chronic kidney disease stage , acquired hypothyroidism, hyperlipidemia, prediabetes, advanced age, postmenopausal female.  Cardiomyopathy, unspecified type ([place]) Etiology unspecified. Compensated, stage B, NYHA class II Has tolerated initiation of Entresto well. Wants to hold off on up titration of Entresto at this time as the medication is prohibitive.  Samples for Entresto provided to patient assistance form provided. She wants to hold off on up titration of GDMT for now. Strict I's and O's, daily weights. Did not tolerate isosorbide dinitrate in the past due to headaches  Essential hypertension Office blood pressures at home numbers are well-controlled. Continue current medical therapy. No changes warranted at this time  Hyperlipidemia Currently on rosuvastatin.   She denies myalgia or other side effects. Most recent lipids dated April 2023, reviewed as noted above. Currently managed by primary care provider.  FINAL MEDICATION LIST END OF ENCOUNTER: No orders of the defined types were placed in this encounter.   There are no discontinued medications.    Current Outpatient Medications:    ACCU-CHEK AVIVA PLUS test strip, , Disp: , Rfl:    allopurinol (ZYLOPRIM) 100 MG tablet, Take 1 tablet (100 mg total) by mouth daily. (Patient taking differently: Take 100 mg  by mouth daily. Patient taking every other day), Disp: 90 tablet, Rfl: 1   ALPRAZolam (XANAX) 0.5 MG tablet, TAKE 1 TABLET BY MOUTH 3 TIMES  DAILY AS NEEDED FOR ANXIETY OR  SLEEP, Disp: 270 tablet, Rfl: 1   aspirin 81 MG chewable tablet, Chew 81 mg by mouth every morning., Disp: , Rfl:    cyanocobalamin (VITAMIN B12) 1000 MCG/ML injection, INJECT 1 ML INTO THE MUSCLE  ONCE EVERY MONTH, Disp: 3 mL, Rfl: 0   Digestive Enzymes (DIGESTIVE ENZYME PO), Take 2 capsules by mouth every morning., Disp: , Rfl:    ENTRESTO 24-26 MG, Take 1 tablet by mouth twice daily, Disp: 60 tablet, Rfl: 0   famotidine (PEPCID) 20 MG tablet, Take 2 tablets by mouth once daily, Disp: 180 tablet, Rfl: 0   hydrALAZINE (APRESOLINE) 25 MG tablet, Take 1 tablet (25 mg total) by mouth in the morning and at bedtime., Disp: 270 tablet, Rfl: 3   Lancet Devices (SIMPLE DIAGNOSTICS LANCING DEV) MISC, , Disp: , Rfl:    levothyroxine (SYNTHROID, LEVOTHROID) 88 MCG tablet, Take 88 mcg by mouth daily before breakfast., Disp: , Rfl:    magnesium hydroxide (DULCOLAX) 400 MG/5ML suspension, Take 15 mLs by mouth daily as needed for mild constipation., Disp: , Rfl:    Polyethyl Glycol-Propyl Glycol (SYSTANE) 0.4-0.3 % SOLN, Place 1 drop into both eyes daily as needed (dry eyes)., Disp: , Rfl:    rosuvastatin (CRESTOR) 10 MG tablet, Take 10 mg by mouth every morning., Disp: , Rfl:    vitamin E 180 MG (400 UNITS) capsule, Take 400 Units by mouth every morning., Disp: , Rfl:   No orders of the defined types were placed in this encounter.   There are no Patient Instructions on file for this visit.   --Continue cardiac medications as reconciled in final medication list. --Return in about 6 months (around 02/07/2023) for Follow up cardiomyopathy . Or  sooner if needed. --Continue follow-up with your primary care physician regarding the management of your other chronic comorbid conditions.  Patient's questions and concerns were addressed to her  satisfaction. She voices understanding of the instructions provided during this encounter.   This note was created using a voice recognition software as a result there may be grammatical errors inadvertently enclosed that do not reflect the nature of this encounter. Every attempt is made to correct such errors.  Rex Kras, [place], [facility]  Pager:  [phone] Office: [phone]

## 2022-08-10 ENCOUNTER — Ambulatory Visit: Payer: Medicare Other | Admitting: Cardiology

## 2022-08-14 ENCOUNTER — Encounter: Payer: Self-pay | Admitting: Nephrology

## 2022-09-06 ENCOUNTER — Encounter: Payer: Self-pay | Admitting: Internal Medicine

## 2022-09-06 ENCOUNTER — Ambulatory Visit: Payer: Medicare Other | Admitting: Internal Medicine

## 2022-09-06 VITALS — BP 126/80 | HR 77 | Temp 98.0°F | Ht 65.0 in | Wt 161.0 lb

## 2022-09-06 DIAGNOSIS — E785 Hyperlipidemia, unspecified: Secondary | ICD-10-CM

## 2022-09-06 DIAGNOSIS — N1832 Chronic kidney disease, stage 3b: Secondary | ICD-10-CM | POA: Diagnosis not present

## 2022-09-06 DIAGNOSIS — F331 Major depressive disorder, recurrent, moderate: Secondary | ICD-10-CM

## 2022-09-06 DIAGNOSIS — Z0001 Encounter for general adult medical examination with abnormal findings: Secondary | ICD-10-CM

## 2022-09-06 DIAGNOSIS — E039 Hypothyroidism, unspecified: Secondary | ICD-10-CM | POA: Diagnosis not present

## 2022-09-06 DIAGNOSIS — K31A0 Gastric intestinal metaplasia, unspecified: Secondary | ICD-10-CM | POA: Diagnosis not present

## 2022-09-06 DIAGNOSIS — I10 Essential (primary) hypertension: Secondary | ICD-10-CM | POA: Diagnosis not present

## 2022-09-06 DIAGNOSIS — F411 Generalized anxiety disorder: Secondary | ICD-10-CM

## 2022-09-06 DIAGNOSIS — R7303 Prediabetes: Secondary | ICD-10-CM

## 2022-09-06 LAB — CBC WITH DIFFERENTIAL/PLATELET
Basophils Absolute: 0 10*3/uL (ref 0.0–0.1)
Basophils Relative: 0.8 % (ref 0.0–3.0)
Eosinophils Absolute: 0.2 10*3/uL (ref 0.0–0.7)
Eosinophils Relative: 3.9 % (ref 0.0–5.0)
HCT: 41.3 % (ref 36.0–46.0)
Hemoglobin: 14.2 g/dL (ref 12.0–15.0)
Lymphocytes Relative: 29.4 % (ref 12.0–46.0)
Lymphs Abs: 1.4 10*3/uL (ref 0.7–4.0)
MCHC: 34.4 g/dL (ref 30.0–36.0)
MCV: 82.7 fl (ref 78.0–100.0)
Monocytes Absolute: 0.6 10*3/uL (ref 0.1–1.0)
Monocytes Relative: 12.1 % — ABNORMAL HIGH (ref 3.0–12.0)
Neutro Abs: 2.6 10*3/uL (ref 1.4–7.7)
Neutrophils Relative %: 53.8 % (ref 43.0–77.0)
Platelets: 208 10*3/uL (ref 150.0–400.0)
RBC: 5 Mil/uL (ref 3.87–5.11)
RDW: 15 % (ref 11.5–15.5)
WBC: 4.9 10*3/uL (ref 4.0–10.5)

## 2022-09-06 LAB — LIPID PANEL
Cholesterol: 116 mg/dL (ref 0–200)
HDL: 79.7 mg/dL (ref >39.00)
LDL Cholesterol: 28 mg/dL (ref 0–99)
NonHDL: 36.63
Total CHOL/HDL Ratio: 1
Triglycerides: 44 mg/dL (ref 0.0–149.0)
VLDL: 8.8 mg/dL (ref 0.0–40.0)

## 2022-09-06 LAB — HEPATIC FUNCTION PANEL
ALT: 18 U/L (ref 0–35)
AST: 31 U/L (ref 0–37)
Albumin: 4 g/dL (ref 3.5–5.2)
Alkaline Phosphatase: 45 U/L (ref 39–117)
Bilirubin, Direct: 0.3 mg/dL (ref 0.0–0.3)
Total Bilirubin: 1.5 mg/dL — ABNORMAL HIGH (ref 0.2–1.2)
Total Protein: 7 g/dL (ref 6.0–8.3)

## 2022-09-06 LAB — BASIC METABOLIC PANEL
BUN: 10 mg/dL (ref 6–23)
CO2: 30 mEq/L (ref 19–32)
Calcium: 9.9 mg/dL (ref 8.4–10.5)
Chloride: 105 mEq/L (ref 96–112)
Creatinine, Ser: 1.15 mg/dL (ref 0.40–1.20)
GFR: 45.9 mL/min — ABNORMAL LOW (ref >60.00)
Glucose, Bld: 102 mg/dL — ABNORMAL HIGH (ref 70–99)
Potassium: 5.2 mEq/L — ABNORMAL HIGH (ref 3.5–5.1)
Sodium: 141 mEq/L (ref 135–145)

## 2022-09-06 LAB — URINALYSIS, ROUTINE W REFLEX MICROSCOPIC
Bilirubin Urine: NEGATIVE
Hgb urine dipstick: NEGATIVE
Nitrite: NEGATIVE
Specific Gravity, Urine: 1.025 (ref 1.000–1.030)
Urine Glucose: NEGATIVE
Urobilinogen, UA: 0.2 (ref 0.0–1.0)
pH: 5.5 (ref 5.0–8.0)

## 2022-09-06 LAB — MICROALBUMIN / CREATININE URINE RATIO
Creatinine,U: 247.4 mg/dL
Microalb Creat Ratio: 2.4 mg/g (ref 0.0–30.0)
Microalb, Ur: 5.9 mg/dL — ABNORMAL HIGH (ref 0.0–1.9)

## 2022-09-06 LAB — TSH: TSH: 2.95 u[IU]/mL (ref 0.35–5.50)

## 2022-09-06 MED ORDER — ROSUVASTATIN CALCIUM 10 MG PO TABS: 10.0000 mg | ORAL_TABLET | Freq: Every morning | ORAL | 1 refills | Status: DC

## 2022-09-06 MED ORDER — MIRTAZAPINE 7.5 MG PO TABS: 7.5000 mg | ORAL_TABLET | Freq: Every day | ORAL | 0 refills | Status: DC

## 2022-09-06 MED ORDER — ALPRAZOLAM 0.5 MG PO TABS: ORAL_TABLET | ORAL | 1 refills | Status: DC

## 2022-09-06 NOTE — Patient Instructions (Signed)

## 2022-09-06 NOTE — Progress Notes (Signed)
Subjective:  Patient ID: Katie [name], female    DOB: [birth year]  Age: 77 y.o. MRN: [id number]  CC: Annual Exam, Hypothyroidism, Hypertension, and Depression   HPI Katie [name] presents for a CPX and f/up --   She complains of tremors, sadness, irritability, cold sensation, anxiety, and feeling hopeless.  Outpatient Medications Prior to Visit  Medication Sig Dispense Refill   allopurinol (ZYLOPRIM) 100 MG tablet Take 1 tablet (100 mg total) by mouth daily. (Patient taking differently: Take 100 mg by mouth daily. Patient taking every other day) 90 tablet 1   aspirin 81 MG chewable tablet Chew 81 mg by mouth every morning.     cyanocobalamin (VITAMIN B12) 1000 MCG/ML injection INJECT 1 ML INTO THE MUSCLE  ONCE EVERY MONTH 3 mL 0   ENTRESTO 24-26 MG Take 1 tablet by mouth twice daily 60 tablet 0   famotidine (PEPCID) 20 MG tablet Take 2 tablets by mouth once daily 180 tablet 0   hydrALAZINE (APRESOLINE) 25 MG tablet Take 1 tablet (25 mg total) by mouth in the morning and at bedtime. 270 tablet 3   magnesium hydroxide (DULCOLAX) 400 MG/5ML suspension Take 15 mLs by mouth daily as needed for mild constipation.     Polyethyl Glycol-Propyl Glycol (SYSTANE) 0.4-0.3 % SOLN Place 1 drop into both eyes daily as needed (dry eyes).     ACCU-CHEK AVIVA PLUS test strip      ALPRAZolam (XANAX) 0.5 MG tablet TAKE 1 TABLET BY MOUTH 3 TIMES  DAILY AS NEEDED FOR ANXIETY OR  SLEEP 270 tablet 1   Digestive Enzymes (DIGESTIVE ENZYME PO) Take 2 capsules by mouth every morning.     Lancet Devices (SIMPLE DIAGNOSTICS LANCING DEV) MISC      levothyroxine (SYNTHROID, LEVOTHROID) 88 MCG tablet Take 88 mcg by mouth daily before breakfast.     rosuvastatin (CRESTOR) 10 MG tablet Take 10 mg by mouth every morning.     vitamin E 180 MG (400 UNITS) capsule Take 400 Units by mouth every morning.     No facility-administered medications prior to visit.    ROS Review of Systems  Constitutional:  Negative for chills,  diaphoresis, fatigue, fever and unexpected weight change.  HENT: Negative.    Eyes: Negative.   Respiratory:  Negative for cough, chest tightness, shortness of breath and wheezing.   Cardiovascular:  Negative for chest pain, palpitations and leg swelling.  Gastrointestinal:  Negative for abdominal pain, constipation, diarrhea, nausea and vomiting.  Endocrine: Negative.   Genitourinary: Negative.  Negative for difficulty urinating and dysuria.  Musculoskeletal: Negative.  Negative for arthralgias and myalgias.  Skin: Negative.   Neurological: Negative.  Negative for dizziness, weakness, light-headedness and headaches.  Hematological:  Negative for adenopathy. Does not bruise/bleed easily.  Psychiatric/Behavioral:  Positive for decreased concentration, dysphoric mood and sleep disturbance. Negative for confusion and suicidal ideas. The patient is nervous/anxious. The patient is not hyperactive.     Objective:  BP 126/80 (BP Location: Right Arm, Patient Position: Sitting, Cuff Size: Large)   Pulse 77   Temp 98 F (36.7 C) (Oral)   Ht 5\' 5"  (1.651 m)   Wt 161 lb (73 kg)   SpO2 98%   BMI 26.79 kg/m   BP Readings from Last 3 Encounters:  09/06/22 126/80  08/07/22 135/77  05/26/22 135/80    Wt Readings from Last 3 Encounters:  09/06/22 161 lb (73 kg)  08/07/22 164 lb 3.2 oz (74.5 kg)  05/26/22 163 lb 9.6 oz (74.2 kg)  Physical Exam Vitals reviewed.  Constitutional:      Appearance: Normal appearance. She is not ill-appearing.  HENT:     Nose: Nose normal.     Mouth/Throat:     Mouth: Mucous membranes are moist.  Eyes:     General: No scleral icterus.    Conjunctiva/sclera: Conjunctivae normal.  Cardiovascular:     Rate and Rhythm: Normal rate and regular rhythm.     Heart sounds: No murmur heard.    No gallop.  Pulmonary:     Breath sounds: No stridor. No wheezing, rhonchi or rales.  Abdominal:     Palpations: There is no mass.     Tenderness: There is no abdominal  tenderness. There is no guarding.     Hernia: No hernia is present.  Musculoskeletal:        General: Normal range of motion.     Cervical back: Neck supple.     Right lower leg: No edema.     Left lower leg: No edema.  Lymphadenopathy:     Cervical: No cervical adenopathy.  Skin:    General: Skin is warm and dry.     Coloration: Skin is not pale.  Neurological:     General: No focal deficit present.     Mental Status: She is alert. Mental status is at baseline.  Psychiatric:        Attention and Perception: Attention and perception normal. She is attentive.        Mood and Affect: Mood is anxious and depressed. Affect is flat. Affect is not tearful.        Speech: Speech normal. She is communicative. Speech is not rapid and pressured, delayed or tangential.        Thought Content: Thought content normal. Thought content is not paranoid or delusional. Thought content does not include homicidal or suicidal ideation.     Lab Results  Component Value Date   WBC 4.9 09/06/2022   HGB 14.2 09/06/2022   HCT 41.3 09/06/2022   PLT 208.0 09/06/2022   GLUCOSE 102 (H) 09/06/2022   CHOL 116 09/06/2022   TRIG 44.0 09/06/2022   HDL 79.70 09/06/2022   LDLDIRECT 105.9 07/04/2007   LDLCALC 28 09/06/2022   ALT 18 09/06/2022   AST 31 09/06/2022   NA 141 09/06/2022   K 5.2 (H) 09/06/2022   CL 105 09/06/2022   CREATININE 1.15 09/06/2022   BUN 10 09/06/2022   CO2 30 09/06/2022   TSH 2.95 09/06/2022   HGBA1C 5.3 02/14/2022   MICROALBUR 5.9 (H) 09/06/2022    MR LUMBAR SPINE WO CONTRAST  Result Date: 06/12/2022 CLINICAL DATA:  Low back pain radiating into the left lower extremity for 6 weeks. EXAM: MRI LUMBAR SPINE WITHOUT CONTRAST TECHNIQUE: Multiplanar, multisequence MR imaging of the lumbar spine was performed. No intravenous contrast was administered. COMPARISON:  10/04/2017 FINDINGS: Segmentation:  Standard. Alignment:  Grade 1 anterolisthesis at L4-5 and L5-S1. Vertebrae: No acute  fracture, evidence of discitis, or bone lesion. Remote L2 body fracture. Conus medullaris and cauda equina: Conus extends to the L1 level. Conus and cauda equina appear normal. Paraspinal and other soft tissues: Cholelithiasis. No perispinal mass or inflammation Disc levels: T12- L1: Mild disc height loss and bulging.  Mild facet spurring L1-L2: Disc height loss with mild bulging.  No neural impingement L2-L3: Disc height loss with bulging. Mild degenerative facet spurring. L3-L4: Degenerative facet spurring on both sides. Mild disc bulging. L4-L5: Advanced facet osteoarthritis with spurring and anterolisthesis.  Circumferential disc bulging with central protrusion. Bilateral foraminal impingement greater on the left. Mild spinal stenosis L5-S1:Advanced facet osteoarthritis with bulkier spurring on the right. Greatest level of degenerative disc narrowing with circumferential bulging and far-lateral spurring. Patent canal and foramina. Mild right subarticular recess narrowing. IMPRESSION: 1. No significant change when compared to 2019. 2. Generalized lumbar spine degeneration especially affecting the L4-5 and L5-S1 facets where there is anterolisthesis. 3. L4-5 left more than right foraminal stenosis that could be symptomatic. 4. Diffusely patent spinal canal. 5. Incidental cholelithiasis. Electronically Signed   By: Tiburcio Pea M.D.   On: 06/12/2022 19:30    Assessment & Plan:   Hyperlipidemia with target LDL less than 100- LDL goal achieved. Doing well on the statin  -     Rosuvastatin Calcium; Take 1 tablet (10 mg total) by mouth every morning.  Dispense: 90 tablet; Refill: 1 -     Lipid panel; Future -     Hepatic function panel; Future -     TSH; Future  GAD (generalized anxiety disorder) -     Mirtazapine; Take 1 tablet (7.5 mg total) by mouth at bedtime.  Dispense: 90 tablet; Refill: 0 -     ALPRAZolam; TAKE 1 TABLET BY MOUTH 3 TIMES  DAILY AS NEEDED FOR ANXIETY OR  SLEEP  Dispense: 270 tablet;  Refill: 1 -     TSH; Future  Moderate episode of recurrent major depressive disorder (HCC) -     Mirtazapine; Take 1 tablet (7.5 mg total) by mouth at bedtime.  Dispense: 90 tablet; Refill: 0 -     TSH; Future  Stage 3b chronic kidney disease (HCC) - Renal function is stable. -     Urinalysis, Routine w reflex microscopic; Future -     Microalbumin / creatinine urine ratio; Future -     Basic metabolic panel; Future  Encounter for general adult medical examination with abnormal findings- Exam completed, labs reviewed, vaccines are up-to-date, cancer screenings not indicated, patient education was given.  Essential hypertension- Her blood pressure is well-controlled. -     CBC with Differential/Platelet; Future -     Urinalysis, Routine w reflex microscopic; Future -     Basic metabolic panel; Future  Acquired hypothyroidism- She is euthyroid. -     TSH; Future  Intestinal metaplasia of gastric mucosa- -     Hepatic function panel; Future  Prediabetes -     HM Diabetes Foot Exam     Follow-up: Return in about 6 months (around 03/08/2023).  Sanda Linger, MD

## 2022-09-23 ENCOUNTER — Other Ambulatory Visit: Payer: Self-pay | Admitting: Cardiology

## 2022-09-23 DIAGNOSIS — I10 Essential (primary) hypertension: Secondary | ICD-10-CM

## 2022-09-27 ENCOUNTER — Telehealth: Payer: Self-pay | Admitting: Hematology and Oncology

## 2022-09-27 NOTE — Telephone Encounter (Signed)
Left patient a vm to call back to get appointment rescheduled

## 2022-09-28 ENCOUNTER — Other Ambulatory Visit: Payer: Self-pay | Admitting: *Deleted

## 2022-09-28 DIAGNOSIS — D472 Monoclonal gammopathy: Secondary | ICD-10-CM

## 2022-09-29 ENCOUNTER — Inpatient Hospital Stay: Payer: Medicare Other | Attending: Hematology and Oncology

## 2022-09-29 ENCOUNTER — Other Ambulatory Visit: Payer: Self-pay

## 2022-09-29 DIAGNOSIS — D472 Monoclonal gammopathy: Secondary | ICD-10-CM | POA: Diagnosis not present

## 2022-09-29 LAB — CBC WITH DIFFERENTIAL (CANCER CENTER ONLY)
Abs Immature Granulocytes: 0.02 10*3/uL (ref 0.00–0.07)
Basophils Absolute: 0.1 10*3/uL (ref 0.0–0.1)
Basophils Relative: 1 %
Eosinophils Absolute: 0.3 10*3/uL (ref 0.0–0.5)
Eosinophils Relative: 6 %
HCT: 38.2 % (ref 36.0–46.0)
Hemoglobin: 13.7 g/dL (ref 12.0–15.0)
Immature Granulocytes: 0 %
Lymphocytes Relative: 28 %
Lymphs Abs: 1.5 10*3/uL (ref 0.7–4.0)
MCH: 29.5 pg (ref 26.0–34.0)
MCHC: 35.9 g/dL (ref 30.0–36.0)
MCV: 82.3 fL (ref 80.0–100.0)
Monocytes Absolute: 0.5 10*3/uL (ref 0.1–1.0)
Monocytes Relative: 10 %
Neutro Abs: 2.9 10*3/uL (ref 1.7–7.7)
Neutrophils Relative %: 55 %
Platelet Count: 183 10*3/uL (ref 150–400)
RBC: 4.64 MIL/uL (ref 3.87–5.11)
RDW: 14.7 % (ref 11.5–15.5)
WBC Count: 5.3 10*3/uL (ref 4.0–10.5)
nRBC: 0 % (ref 0.0–0.2)

## 2022-09-29 LAB — CMP (CANCER CENTER ONLY)
ALT: 18 U/L (ref 0–44)
AST: 29 U/L (ref 15–41)
Albumin: 4 g/dL (ref 3.5–5.0)
Alkaline Phosphatase: 50 U/L (ref 38–126)
Anion gap: 4 — ABNORMAL LOW (ref 5–15)
BUN: 10 mg/dL (ref 8–23)
CO2: 29 mmol/L (ref 22–32)
Calcium: 9.4 mg/dL (ref 8.9–10.3)
Chloride: 108 mmol/L (ref 98–111)
Creatinine: 1.08 mg/dL — ABNORMAL HIGH (ref 0.44–1.00)
GFR, Estimated: 53 mL/min — ABNORMAL LOW (ref >60)
Glucose, Bld: 93 mg/dL (ref 70–99)
Potassium: 3.7 mmol/L (ref 3.5–5.1)
Sodium: 141 mmol/L (ref 135–145)
Total Bilirubin: 1.4 mg/dL — ABNORMAL HIGH (ref 0.3–1.2)
Total Protein: 6.8 g/dL (ref 6.5–8.1)

## 2022-10-02 ENCOUNTER — Other Ambulatory Visit: Payer: Self-pay | Admitting: Cardiology

## 2022-10-02 DIAGNOSIS — I429 Cardiomyopathy, unspecified: Secondary | ICD-10-CM

## 2022-10-03 LAB — KAPPA/LAMBDA LIGHT CHAINS
Kappa free light chain: 29.6 mg/L — ABNORMAL HIGH (ref 3.3–19.4)
Kappa, lambda light chain ratio: 0.35 (ref 0.26–1.65)
Lambda free light chains: 84.9 mg/L — ABNORMAL HIGH (ref 5.7–26.3)

## 2022-10-04 ENCOUNTER — Telehealth: Payer: Self-pay | Admitting: Hematology and Oncology

## 2022-10-04 NOTE — Telephone Encounter (Signed)
Rescheduled appointment per incoming call. Patient is aware of the changes made to her upcoming appointment.

## 2022-10-06 ENCOUNTER — Inpatient Hospital Stay: Payer: Medicare Other | Admitting: Hematology and Oncology

## 2022-10-08 LAB — IMMUNOFIXATION ELECTROPHORESIS
IgA: 220 mg/dL (ref 64–422)
IgG (Immunoglobin G), Serum: 1242 mg/dL (ref 586–1602)
IgM (Immunoglobulin M), Srm: 160 mg/dL (ref 26–217)
Total Protein ELP: 6.5 g/dL (ref 6.0–8.5)

## 2022-10-10 LAB — TSH: TSH: 2.53 (ref 0.41–5.90)

## 2022-10-10 LAB — HEMOGLOBIN A1C: Hemoglobin A1C: 5.6

## 2022-10-11 LAB — PROTEIN ELECTROPHORESIS, SERUM, WITH REFLEX
A/G Ratio: 1.2 (ref 0.7–1.7)
Albumin ELP: 3.6 g/dL (ref 2.9–4.4)
Alpha-1-Globulin: 0.2 g/dL (ref 0.0–0.4)
Alpha-2-Globulin: 0.7 g/dL (ref 0.4–1.0)
Beta Globulin: 0.9 g/dL (ref 0.7–1.3)
Gamma Globulin: 1.3 g/dL (ref 0.4–1.8)
Globulin, Total: 3.1 g/dL (ref 2.2–3.9)
M-Spike, %: 0.5 g/dL — ABNORMAL HIGH
SPEP Interpretation: 0
Total Protein ELP: 6.7 g/dL (ref 6.0–8.5)

## 2022-10-11 LAB — IMMUNOFIXATION REFLEX, SERUM
IgA: 229 mg/dL (ref 64–422)
IgG (Immunoglobin G), Serum: 1326 mg/dL (ref 586–1602)
IgM (Immunoglobulin M), Srm: 175 mg/dL (ref 26–217)

## 2022-10-20 ENCOUNTER — Other Ambulatory Visit: Payer: Self-pay

## 2022-10-20 ENCOUNTER — Other Ambulatory Visit: Payer: Self-pay | Admitting: Internal Medicine

## 2022-10-20 DIAGNOSIS — R17 Unspecified jaundice: Secondary | ICD-10-CM

## 2022-10-31 ENCOUNTER — Telehealth: Payer: Self-pay

## 2022-10-31 NOTE — Telephone Encounter (Signed)
Called and LVMs regarding 11/01/22 MD appt. Pt stated her husband passed away and she needs to cancel 06/14 appt. Appt rescheduled for next available on 07/10. Advised that is Pt is unable to make this appt, to call back and reschedule. Gave number for rescheduling.

## 2022-11-01 ENCOUNTER — Inpatient Hospital Stay: Payer: Medicare Other | Admitting: Hematology and Oncology

## 2022-11-12 ENCOUNTER — Other Ambulatory Visit: Payer: Self-pay | Admitting: Internal Medicine

## 2022-11-12 DIAGNOSIS — F331 Major depressive disorder, recurrent, moderate: Secondary | ICD-10-CM

## 2022-11-12 DIAGNOSIS — F411 Generalized anxiety disorder: Secondary | ICD-10-CM

## 2022-11-15 ENCOUNTER — Ambulatory Visit
Admission: RE | Admit: 2022-11-15 | Discharge: 2022-11-15 | Disposition: A | Discharge status: Home / Self Care | Payer: Medicare Other | Source: Ambulatory Visit | Attending: Internal Medicine | Admitting: Internal Medicine

## 2022-11-15 DIAGNOSIS — R17 Unspecified jaundice: Secondary | ICD-10-CM

## 2022-11-22 ENCOUNTER — Other Ambulatory Visit: Payer: Self-pay

## 2022-11-22 ENCOUNTER — Inpatient Hospital Stay: Payer: Medicare Other | Attending: Hematology and Oncology | Admitting: Hematology and Oncology

## 2022-11-22 ENCOUNTER — Other Ambulatory Visit: Payer: Self-pay | Admitting: Internal Medicine

## 2022-11-22 ENCOUNTER — Telehealth: Payer: Self-pay | Admitting: Internal Medicine

## 2022-11-22 VITALS — BP 132/88 | HR 108 | Temp 98.1°F | Resp 18 | Ht 65.0 in | Wt 161.1 lb

## 2022-11-22 DIAGNOSIS — N189 Chronic kidney disease, unspecified: Secondary | ICD-10-CM | POA: Insufficient documentation

## 2022-11-22 DIAGNOSIS — D472 Monoclonal gammopathy: Secondary | ICD-10-CM | POA: Insufficient documentation

## 2022-11-22 DIAGNOSIS — E1122 Type 2 diabetes mellitus with diabetic chronic kidney disease: Secondary | ICD-10-CM | POA: Insufficient documentation

## 2022-11-22 DIAGNOSIS — F411 Generalized anxiety disorder: Secondary | ICD-10-CM

## 2022-11-22 DIAGNOSIS — I129 Hypertensive chronic kidney disease with stage 1 through stage 4 chronic kidney disease, or unspecified chronic kidney disease: Secondary | ICD-10-CM | POA: Insufficient documentation

## 2022-11-22 MED ORDER — ALPRAZOLAM 0.5 MG PO TABS: ORAL_TABLET | ORAL | 0 refills | Status: DC

## 2022-11-22 NOTE — Progress Notes (Signed)
[facility] CONSULT NOTE  Patient Care Team: Katie Grandchild, MD as PCP - General (Internal Medicine) Katie [name], Katie Needle, MD (Endocrinology) Katie Dare, MD (Gastroenterology) Katie [name], DPM (Podiatry) Katie Bolus, MD (Ophthalmology) Katie [name], Katie Aguas, MD (Gynecology) Katie Harder, MD (Inactive) as Consulting Physician (Dermatology)  CHIEF COMPLAINTS/PURPOSE OF CONSULTATION:  MGUS  ASSESSMENT & PLAN:   MGUS (monoclonal gammopathy of unknown significance) This is a very pleasant 77 year old female patient with past medical history significant for CKD who has recently seen her nephrologist because of worsening chronic kidney disease had some lab evaluation and was found to have monoclonal gammopathy on her labs and hence referred to hematology for further recommendations.   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 Bone marrow biopsy demonstrated small clone of plasma cells measuring about 3 to 5%, normal FISH testing, normal cytogenetics. 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 She denies any new symptoms. PE unremarkable today Labs from May 2024 stable no concern for worsening monoclonal protein. Given low risk MGUS, we have recommended follow up with labs every 6 to 12 months She will return to clinic in about 6 months.  No orders of the defined types were placed in this encounter.  Thank you for consulting [name] in the care of this patient.  Please not hesitate to contact us with any additional questions or concerns.  HISTORY OF PRESENTING ILLNESS:   Katie [name] 77 y.o. female is here because of MGUS  Interval History  Katie [name] is here for follow-up today.  Since her last visit here, her husband died suddenly.  He was doing chemoradiation for esophageal cancer.  She is mostly trying to cope with his demise.  She is trying to do her best, not eating as well and has lost  some weight.  She has some friends and family members who have been assisting her.  No B symptoms reported.  No new bone pains.  No change in breathing, bowel habits or urinary habits.   Rest of the pertinent 10 point ROS reviewed and negative.  MEDICAL HISTORY:  Past Medical History:  Diagnosis Date   Acute venous embolism and thrombosis of unspecified deep vessels of lower extremity 2001   RLE DVT and PE, coumadin x 31mo, now ASSA   Anxiety state, unspecified    Chronic idiopathic constipation 11/02/2016   Degeneration of cervical intervertebral disc    Gallstones    History of DVT (deep vein thrombosis)    Iron deficiency anemia secondary to blood loss (chronic)    Irritable bowel syndrome    Lumbago    Other B-complex deficiencies    Personal history of colonic polyps    adenomatous   Pure hypercholesterolemia    Reflux esophagitis    Tubular adenoma of colon 09/2002   Type II or unspecified type diabetes mellitus without mention of complication, not stated as uncontrolled    Unspecified essential hypertension    Unspecified hypothyroidism    Unspecified iridocyclitis    Unspecified venous (peripheral) insufficiency    Unspecified vitamin D deficiency     SURGICAL HISTORY: Past Surgical History:  Procedure Laterality Date   BIOPSY  04/24/2018   Procedure: BIOPSY;  Surgeon: Katie Lofty., MD;  Location: [facility];  Service: Gastroenterology;;   CARPAL TUNNEL RELEASE     CATARACT EXTRACTION Bilateral 08/29/2017,09/19/17   ESOPHAGOGASTRODUODENOSCOPY (EGD) WITH PROPOFOL N/A 04/24/2018  Procedure: ESOPHAGOGASTRODUODENOSCOPY (EGD) WITH PROPOFOL;  Surgeon: Katie Score Netty Starring., MD;  Location: [facility];  Service: Gastroenterology;  Laterality: N/A;  with EMR   FEMORAL HERNIA REPAIR     OOPHORECTOMY     POLYPECTOMY  04/24/2018   Procedure: POLYPECTOMY;  Surgeon: Katie [name], Netty Starring., MD;  Location: [facility];  Service: Gastroenterology;;   UPPER  ESOPHAGEAL ENDOSCOPIC ULTRASOUND (EUS)  04/24/2018   Procedure: UPPER ESOPHAGEAL ENDOSCOPIC ULTRASOUND (EUS);  Surgeon: Katie Score Netty Starring., MD;  Location: [facility];  Service: Gastroenterology;;   VESICOVAGINAL FISTULA CLOSURE W/ TAH      SOCIAL HISTORY: Social History   Socioeconomic History   Marital status: Married    Spouse name: Katie [name]   Number of children: 0   Years of education: masters   Highest education level: Not on file  Occupational History   Occupation: record Chief Executive Officer: [employer]  Tobacco Use   Smoking status: Never   Smokeless tobacco: Never  Vaping Use   Vaping Use: Never used  Substance and Sexual Activity   Alcohol use: Yes    Alcohol/week: 0.0 standard drinks of alcohol    Comment: occasional-wine    Drug use: No   Sexual activity: Not Currently  Other Topics Concern   Not on file  Social History Narrative   Lives with husband in a 2 story home with a basement.  Has no children.     Retired from the police department.     Education: masters (guidance and counseling).    Social Determinants of Health   Financial Resource Strain: Low Risk  (02/23/2022)   Overall Financial Resource Strain (CARDIA)    Difficulty of Paying Living Expenses: Not hard at all  Food Insecurity: No Food Insecurity (02/23/2022)   Hunger Vital Sign    Worried About Running Out of Food in the Last Year: Never true    Ran Out of Food in the Last Year: Never true  Transportation Needs: No Transportation Needs (02/23/2022)   PRAPARE - Administrator, Civil Service (Medical): No    Lack of Transportation (Non-Medical): No  Physical Activity: Sufficiently Active (02/23/2022)   Exercise Vital Sign    Days of Exercise per Week: 3 days    Minutes of Exercise per Session: 60 min  Stress: Stress Concern Present (02/23/2022)   [employer] of Occupational Health - Occupational Stress Questionnaire    Feeling of Stress : Very much  Social  Connections: Socially Integrated (02/23/2022)   Social Connection and Isolation Panel [NHANES]    Frequency of Communication with Friends and Family: More than three times a week    Frequency of Social Gatherings with Friends and Family: More than three times a week    Attends Religious Services: More than 4 times per year    Active Member of [employer] or Organizations: Yes    Attends Engineer, structural: More than 4 times per year    Marital Status: Married  Catering manager Violence: Not At Risk (02/23/2022)   Humiliation, Afraid, Rape, and Kick questionnaire    Fear of Current or Ex-Partner: No    Emotionally Abused: No    Physically Abused: No    Sexually Abused: No    FAMILY HISTORY: Family History  Problem Relation Age of Onset   Prostate cancer Father    Diabetes Mother        retinopathy/blind   Deep vein thrombosis Mother    Hypertension Mother    Healthy  Sister    Colon cancer Neg Hx    Esophageal cancer Neg Hx    Stomach cancer Neg Hx    Rectal cancer Neg Hx     ALLERGIES:  is allergic to lisinopril, metoprolol succinate, penicillins, sulfamethoxazole, sulfamethoxazole-trimethoprim, trimethoprim, and metformin.  MEDICATIONS:  Current Outpatient Medications  Medication Sig Dispense Refill   allopurinol (ZYLOPRIM) 100 MG tablet Take 1 tablet (100 mg total) by mouth daily. (Patient taking differently: Take 100 mg by mouth daily. Patient taking every other day) 90 tablet 1   ALPRAZolam (XANAX) 0.5 MG tablet TAKE 1 TABLET BY MOUTH 3 TIMES  DAILY AS NEEDED FOR ANXIETY OR  SLEEP 270 tablet 0   aspirin 81 MG chewable tablet Chew 81 mg by mouth every morning.     Cholecalciferol 50 MCG (2000 UT) TABS Take by mouth.     cyanocobalamin (VITAMIN B12) 1000 MCG/ML injection INJECT 1 ML INTO THE MUSCLE  ONCE EVERY MONTH 3 mL 0   famotidine (PEPCID) 20 MG tablet Take 2 tablets by mouth once daily 180 tablet 0   hydrALAZINE (APRESOLINE) 25 MG tablet TAKE 1 TABLET BY MOUTH  THREE TIMES DAILY 270 tablet 0   magnesium hydroxide (DULCOLAX) 400 MG/5ML suspension Take 15 mLs by mouth daily as needed for mild constipation.     mirtazapine (REMERON) 7.5 MG tablet TAKE 1 TABLET BY MOUTH AT  BEDTIME 90 tablet 0   Polyethyl Glycol-Propyl Glycol (SYSTANE) 0.4-0.3 % SOLN Place 1 drop into both eyes daily as needed (dry eyes).     rosuvastatin (CRESTOR) 10 MG tablet Take 1 tablet (10 mg total) by mouth every morning. 90 tablet 1   sacubitril-valsartan (ENTRESTO) 24-26 MG Take 1 tablet by mouth twice daily 180 tablet 1   No current facility-administered medications for this visit.   PHYSICAL EXAMINATION: ECOG PERFORMANCE STATUS: 0 - Asymptomatic  Vitals:   11/22/22 1455  BP: 132/88  Pulse: (!) 108  Resp: 18  Temp: 98.1 F (36.7 C)  SpO2: 99%    Filed Weights   11/22/22 1455  Weight: 161 lb 1.6 oz (73.1 kg)    Physical Exam Constitutional:      Appearance: Normal appearance.  Cardiovascular:     Rate and Rhythm: Normal rate and regular rhythm.     Pulses: Normal pulses.     Heart sounds: Normal heart sounds.  Pulmonary:     Effort: Pulmonary effort is normal.     Breath sounds: Normal breath sounds.  Musculoskeletal:        General: No swelling or tenderness.     Cervical back: Normal range of motion and neck supple. No rigidity.  Lymphadenopathy:     Cervical: No cervical adenopathy.  Skin:    General: Skin is warm and dry.  Neurological:     General: No focal deficit present.     Mental Status: She is alert.  Psychiatric:        Mood and Affect: Mood normal.      LABORATORY DATA:  I have reviewed the data as listed Lab Results  Component Value Date   WBC 5.3 09/29/2022   HGB 13.7 09/29/2022   HCT 38.2 09/29/2022   MCV 82.3 09/29/2022   PLT 183 09/29/2022     Chemistry      Component Value Date/Time   NA 141 09/29/2022 1440   NA 143 12/15/2021 1426   K 3.7 09/29/2022 1440   CL 108 09/29/2022 1440   CO2 29 09/29/2022 1440  BUN 10  09/29/2022 1440   BUN 17 12/15/2021 1426   CREATININE 1.08 (H) 09/29/2022 1440   GLU 103 01/15/2018 0000      Component Value Date/Time   CALCIUM 9.4 09/29/2022 1440   ALKPHOS 50 09/29/2022 1440   AST 29 09/29/2022 1440   ALT 18 09/29/2022 1440   BILITOT 1.4 (H) 09/29/2022 1440      SURGICAL PATHOLOGY   CASE: [id number]  PATIENT: Atticus Deol  Bone Marrow Report   Reason for Addendum #1:  Molecular Genetic Test Results, FISH  Reason for Addendum #2:  Cytogenetics results   Clinical History: MGUS, left posterior iliac, (ADC)      DIAGNOSIS:   BONE MARROW, ASPIRATE, CLOT, CORE:  -  Normocellular bone marrow with trilineage hematopoiesis and small  lambda-restricted plasma cell population  -  See comment   PERIPHERAL BLOOD:  -  Normocytic anemia  -  See CBC data    COMMENT:   CD138 immunohistochemistry on the clot and core biopsy highlights a mild  increase in plasma cells (3-5%).  Kappa/lambda light chain in situ  hybridization highlights a patchy expansion of lambda-restricted plasma  cells with aberrant CD56 expression by immunohistochemistry in a  background of polytypic plasma cells.  Correlation with clinical  findings, radiographic studies, other laboratory data, and  cytogenetics/FISH results is recommended  FISH results normal, no evidence of abnormalities  Cytogenetics normal.  Labs from 09/28/2021 showed a white blood cell count of 3.9, hemoglobin of 13.7 hematocrit of 40.4 and platelet count of 174,000  SPEP from 09/28/2021 with M spike of 0.6 g/dL.  Immunofixation showed IgG monoclonal protein with lambda light chain specificity Kappa lambda ratio normal at 0.40  CMP showed no evidence of hypercalcemia or acute kidney injury or elevated total protein  RADIOGRAPHIC STUDIES: I have personally reviewed the radiological images as listed and agreed with the findings in the report.  Total time spent: 20 minutes including history, physical exam,  review of records, counseling or coordination of care  US ABDOMEN LIMITED RUQ (LIVER/GB)  Result Date: 11/15/2022 CLINICAL DATA:  Elevated bilirubin EXAM: ULTRASOUND ABDOMEN LIMITED RIGHT UPPER QUADRANT COMPARISON:  Abdominal ultrasound 02/06/2018 FINDINGS: Gallbladder: Multiple shadowing gallstones. No gallbladder wall thickening. No sonographic Murphy sign noted by sonographer. Common bile duct: Diameter: 0.5 cm, within normal limits Liver: No focal lesion identified. Nonspecific coarsening of the hepatic echotexture. Within normal limits in parenchymal echogenicity. Portal vein is patent on color Doppler imaging with normal direction of blood flow towards the liver. Other: None. IMPRESSION: 1. Cholelithiasis without sonographic evidence of acute cholecystitis. 2. Nonspecific coarsening of the hepatic echotexture. No focal lesion. Electronically Signed   By: Emmaline Kluver M.D.   On: 11/15/2022 12:39      Rachel Moulds, MD 11/22/2022 4:30 PM

## 2022-11-22 NOTE — Telephone Encounter (Signed)
Prescription Request  11/22/2022  LOV: 09/06/2022  What is the name of the medication or equipment? ALPRAZolam (XANAX) 0.5 MG tablet   Have you contacted your pharmacy to request a refill? Yes   Which pharmacy would you like this sent to?  [facility] - [place], [place] - [address] [place] [address] Phone: [phone] Fax: [phone]    Patient notified that their request is being sent to the clinical staff for review and that they should receive a response within 2 business days.   Please advise at Mobile [phone] (mobile)

## 2022-11-22 NOTE — Assessment & Plan Note (Signed)
This is a very pleasant 77 year old female patient with past medical history significant for CKD who has recently seen her nephrologist because of worsening chronic kidney disease had some lab evaluation and was found to have monoclonal gammopathy on her labs and hence referred to hematology for further recommendations.   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 Bone marrow biopsy demonstrated small clone of plasma cells measuring about 3 to 5%, normal FISH testing, normal cytogenetics. 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 She denies any new symptoms. PE unremarkable today Labs from May 2024 stable no concern for worsening monoclonal protein. Given low risk MGUS, we have recommended follow up with labs every 6 to 12 months She will return to clinic in about 6 months.

## 2022-11-27 ENCOUNTER — Encounter: Payer: Self-pay | Admitting: Gastroenterology

## 2022-11-27 ENCOUNTER — Telehealth: Payer: Self-pay

## 2022-11-27 ENCOUNTER — Other Ambulatory Visit: Payer: Self-pay

## 2022-11-27 ENCOUNTER — Ambulatory Visit: Payer: Medicare Other

## 2022-11-27 VITALS — Ht 65.0 in | Wt 161.0 lb

## 2022-11-27 DIAGNOSIS — K2270 Barrett's esophagus without dysplasia: Secondary | ICD-10-CM

## 2022-11-27 NOTE — Telephone Encounter (Signed)
Patient was called three times for PV using two different numbers that were listed. No answer twice then no answer that went into a busy signal. Message was left to call and reschedule before 5:00 today. Patient was informed that procedure would be cancelled. If patient doesn't call a no show letter will be mailed and procedure cancelled per [facility] guidelines.

## 2022-11-27 NOTE — Progress Notes (Signed)
Denies allergies to eggs or soy products. Denies complication of anesthesia or sedation. Denies use of weight loss medication. Denies use of O2.    Patient takes Trulicity and it did not get in her instructions. Instructions for Trulicity were hand written and patient verbalizes understanding. Patient states that she does not take it every week due to the cost. Her last dose was 11/19/22. She states she knows not to take it until after her procedure on 12/04/22.

## 2022-12-01 ENCOUNTER — Telehealth: Payer: Self-pay | Admitting: Cardiology

## 2022-12-01 NOTE — Telephone Encounter (Signed)
error 

## 2022-12-04 ENCOUNTER — Ambulatory Visit (AMBULATORY_SURGERY_CENTER): Payer: Medicare Other | Admitting: Gastroenterology

## 2022-12-04 ENCOUNTER — Encounter: Payer: Self-pay | Admitting: Gastroenterology

## 2022-12-04 VITALS — BP 112/62 | HR 70 | Temp 97.1°F | Resp 13 | Ht 65.0 in | Wt 161.0 lb

## 2022-12-04 DIAGNOSIS — K2950 Unspecified chronic gastritis without bleeding: Secondary | ICD-10-CM | POA: Diagnosis not present

## 2022-12-04 DIAGNOSIS — D3A092 Benign carcinoid tumor of the stomach: Secondary | ICD-10-CM

## 2022-12-04 DIAGNOSIS — K31811 Angiodysplasia of stomach and duodenum with bleeding: Secondary | ICD-10-CM | POA: Diagnosis not present

## 2022-12-04 DIAGNOSIS — K2270 Barrett's esophagus without dysplasia: Secondary | ICD-10-CM

## 2022-12-04 DIAGNOSIS — K317 Polyp of stomach and duodenum: Secondary | ICD-10-CM | POA: Diagnosis not present

## 2022-12-04 MED ORDER — SODIUM CHLORIDE 0.9 % IV SOLN
500.0000 mL | Freq: Once | INTRAVENOUS | Status: DC
Start: 2022-12-04 — End: 2022-12-11

## 2022-12-04 NOTE — Progress Notes (Signed)
See 11/22/2022 H&P, no changes

## 2022-12-04 NOTE — Op Note (Signed)
[facility] Patient Name: Katie [name] Procedure Date: 12/04/2022 10:24 AM MRN: [id number] Endoscopist: Meryl Dare , MD, [phone] Age: 77 Referring MD:  Date of Birth: 1945/10/03 Gender: Female Account #: [id number] Procedure:                Upper GI endoscopy Indications:              Surveillance for malignancy due to personal history                            of Barrett's esophagus, gastric NET and gastric                            intestinal metaplasia Medicines:                Monitored Anesthesia Care Procedure:                Pre-Anesthesia Assessment:                           - Prior to the procedure, a History and Physical                            was performed, and patient medications and                            allergies were reviewed. The patient's tolerance of                            previous anesthesia was also reviewed. The risks                            and benefits of the procedure and the sedation                            options and risks were discussed with the patient.                            All questions were answered, and informed consent                            was obtained. Prior Anticoagulants: The patient has                            taken no anticoagulant or antiplatelet agents. ASA                            Grade Assessment: III - A patient with severe                            systemic disease. After reviewing the risks and                            benefits, the patient was deemed in satisfactory  condition to undergo the procedure.                           After obtaining informed consent, the endoscope was                            passed under direct vision. Throughout the                            procedure, the patient's blood pressure, pulse, and                            oxygen saturations were monitored continuously. The                            GIF [id number] #[id number] was  introduced through the                            mouth, and advanced to the second part of duodenum.                            The upper GI endoscopy was accomplished without                            difficulty. The patient tolerated the procedure                            well. Scope In: Scope Out: Findings:                 There were esophageal mucosal changes consistent                            with short-segment Barrett's esophagus present in                            the distal esophagus. The maximum longitudinal                            extent of these mucosal changes was 2 cm in length.                           A single area of ectopic gastric mucosa was found                            in the proximal esophagus.                           The exam of the esophagus was otherwise normal.                           Diffuse atrophic mucosa was found in the entire                            examined stomach.  A single 7 mm angioectasia with bleeding was found                            on the lesser curvature of the stomach. The                            bleeding stopped spotaneously.                           Three 3 to 4 mm sessile polyps/nodules with no                            bleeding and no stigmata of recent bleeding were                            found on the posterior wall of the stomach. The                            polyps/nodules were removed with a cold biopsy                            forceps. Resection and retrieval were complete.                           The exam of the stomach was otherwise normal.                           The duodenal bulb and second portion of the                            duodenum were normal. Complications:            No immediate complications. Estimated Blood Loss:     Estimated blood loss was minimal. Impression:               - Barrett's esophagus at the gastroesophageal                             junction. Biopsied.                           - Ectopic gastric mucosa in the proximal esophagus.                           - Gastric mucosal atrophy.                           - A single bleeding angioectasia in the stomach.                           - Three gastric polyps. Resected and retrieved.                           - Normal duodenal bulb and second portion of the  duodenum. Recommendation:           - Patient has a contact number available for                            emergencies. The signs and symptoms of potential                            delayed complications were discussed with the                            patient. Return to normal activities tomorrow.                            Written discharge instructions were provided to the                            patient.                           - Resume previous diet.                           - Continue present medications.                           - Await pathology results.                           - Repeat upper endoscopy after studies are complete                            for surveillance based on pathology results. Meryl Dare, MD 12/04/2022 11:04:45 AM This report has been signed electronically.

## 2022-12-04 NOTE — Progress Notes (Signed)
Sedate, gd SR, tolerated procedure well, VSS, report to RN 

## 2022-12-04 NOTE — Patient Instructions (Signed)
YOU HAD AN ENDOSCOPIC PROCEDURE TODAY AT THE [facility]:   Refer to the procedure report that was given to you for any specific questions about what was found during the examination.  If the procedure report does not answer your questions, please call your gastroenterologist to clarify.  If you requested that your care partner not be given the details of your procedure findings, then the procedure report has been included in a sealed envelope for you to review at your convenience later.  YOU SHOULD EXPECT: Some feelings of bloating in the abdomen. Passage of more gas than usual.  Walking can help get rid of the air that was put into your GI tract during the procedure and reduce the bloating. If you had a lower endoscopy (such as a colonoscopy or flexible sigmoidoscopy) you may notice spotting of blood in your stool or on the toilet paper. If you underwent a bowel prep for your procedure, you may not have a normal bowel movement for a few days.  Please Note:  You might notice some irritation and congestion in your nose or some drainage.  This is from the oxygen used during your procedure.  There is no need for concern and it should clear up in a day or so.  SYMPTOMS TO REPORT IMMEDIATELY:   Following upper endoscopy (EGD)  Vomiting of blood or coffee ground material  New chest pain or pain under the shoulder blades  Painful or persistently difficult swallowing  New shortness of breath  Fever of 100F or higher  Black, tarry-looking stools  For urgent or emergent issues, a gastroenterologist can be reached at any hour by calling [phone]. Do not use MyChart messaging for urgent concerns.    DIET:  We do recommend a small meal at first, but then you may proceed to your regular diet.  Drink plenty of fluids but you should avoid alcoholic beverages for 24 hours.  ACTIVITY:  You should plan to take it easy for the rest of today and you should NOT DRIVE or use heavy machinery  until tomorrow (because of the sedation medicines used during the test).    FOLLOW UP: Our staff will call the number listed on your records the next business day following your procedure.  We will call around 7:15- 8:00 am to check on you and address any questions or concerns that you may have regarding the information given to you following your procedure. If we do not reach you, we will leave a message.     If any biopsies were taken you will be contacted by phone or by letter within the next 1-3 weeks.  Please call us at [phone] if you have not heard about the biopsies in 3 weeks.    SIGNATURES/CONFIDENTIALITY: You and/or your care partner have signed paperwork which will be entered into your electronic medical record.  These signatures attest to the fact that that the information above on your After Visit Summary has been reviewed and is understood.  Full responsibility of the confidentiality of this discharge information lies with you and/or your care-partner. 

## 2022-12-04 NOTE — Progress Notes (Signed)
Called to room to assist during endoscopic procedure.  Patient ID and intended procedure confirmed with present staff. Received instructions for my participation in the procedure from the performing physician.  

## 2022-12-05 ENCOUNTER — Telehealth: Payer: Self-pay | Admitting: *Deleted

## 2022-12-05 NOTE — Telephone Encounter (Signed)
  Follow up Call-     12/04/2022    9:57 AM  Call back number  Post procedure Call Back phone  # [phone]  Permission to leave phone message Yes     Patient questions:  Do you have a fever, pain , or abdominal swelling? No. Pain Score  0 *  Have you tolerated food without any problems? Yes.    Have you been able to return to your normal activities? Yes.    Do you have any questions about your discharge instructions: Diet   No. Medications  No. Follow up visit  No.  Do you have questions or concerns about your Care? No.  Actions: * If pain score is 4 or above: No action needed, pain <4.

## 2022-12-09 ENCOUNTER — Other Ambulatory Visit: Payer: Self-pay | Admitting: Internal Medicine

## 2022-12-09 DIAGNOSIS — K2100 Gastro-esophageal reflux disease with esophagitis, without bleeding: Secondary | ICD-10-CM

## 2022-12-11 ENCOUNTER — Telehealth: Payer: Self-pay | Admitting: Internal Medicine

## 2022-12-11 ENCOUNTER — Other Ambulatory Visit: Payer: Self-pay | Admitting: Internal Medicine

## 2022-12-11 DIAGNOSIS — F411 Generalized anxiety disorder: Secondary | ICD-10-CM

## 2022-12-11 MED ORDER — ALPRAZOLAM 0.5 MG PO TABS: ORAL_TABLET | ORAL | 0 refills | Status: DC

## 2022-12-11 NOTE — Telephone Encounter (Signed)
Patient was informed by her pharmacy that they do not have ALPRAZolam (XANAX) 0.5 MG tablet in stock and do not know when they will get more. She would like to know if her prescription can be changed to [id number] - [place] ([place]), [place] - [address]. Best callback is [phone].

## 2022-12-14 ENCOUNTER — Encounter: Payer: Self-pay | Admitting: Gastroenterology

## 2022-12-20 ENCOUNTER — Ambulatory Visit: Payer: Medicare Other | Admitting: Internal Medicine

## 2022-12-20 VITALS — BP 134/84 | HR 82 | Temp 97.8°F | Ht 65.0 in | Wt 159.2 lb

## 2022-12-20 DIAGNOSIS — E039 Hypothyroidism, unspecified: Secondary | ICD-10-CM

## 2022-12-20 DIAGNOSIS — F411 Generalized anxiety disorder: Secondary | ICD-10-CM | POA: Diagnosis not present

## 2022-12-20 DIAGNOSIS — F4321 Adjustment disorder with depressed mood: Secondary | ICD-10-CM | POA: Insufficient documentation

## 2022-12-20 DIAGNOSIS — E559 Vitamin D deficiency, unspecified: Secondary | ICD-10-CM

## 2022-12-20 DIAGNOSIS — I10 Essential (primary) hypertension: Secondary | ICD-10-CM

## 2022-12-20 DIAGNOSIS — R7303 Prediabetes: Secondary | ICD-10-CM

## 2022-12-20 NOTE — Patient Instructions (Signed)
Please continue all other medications as before, and refills have been done if requested.  Please have the pharmacy call with any other refills you may need.  Please keep your appointments with your specialists as you may have planned  You will be contacted regarding the referral for: grief counseling

## 2022-12-20 NOTE — Telephone Encounter (Signed)
Pt came in to pick up samples

## 2022-12-20 NOTE — Progress Notes (Unsigned)
Patient ID: Katie [name], female   DOB: June 08, 1945, 77 y.o.   MRN: [id number]        Chief Complaint: follow up grief, anxiety, low vit d       HPI:  Katie [name] is a 77 y.o. female here with c/o 1 mo onset grief after husband passed, now with lower appetite, several lbs wt loss, somewhat lower po intake overall, shaky and tremulous at times, more off balance and cold intolerant at times.  Pt denies chest pain, increased sob or doe, wheezing, orthopnea, PND, increased LE swelling, palpitations, dizziness or syncope.   Pt denies polydipsia, polyuria, or new focal neuro s/s.    Pt denies fever, night sweats, loss of appetite, or other constitutional symptoms  Xanax seems to help.  .       Wt Readings from Last 3 Encounters:  12/20/22 159 lb 4 oz (72.2 kg)  12/04/22 161 lb (73 kg)  11/27/22 161 lb (73 kg)   BP Readings from Last 3 Encounters:  12/20/22 134/84  12/04/22 112/62  11/22/22 132/88         Past Medical History:  Diagnosis Date   Acute venous embolism and thrombosis of unspecified deep vessels of lower extremity 2001   RLE DVT and PE, coumadin x 63mo, now ASSA   Allergy    Anxiety state, unspecified    Blood transfusion without reported diagnosis    Cataract    Chronic idiopathic constipation 11/02/2016   Clotting disorder (HCC)    Degeneration of cervical intervertebral disc    Gallstones    History of DVT (deep vein thrombosis)    Iron deficiency anemia secondary to blood loss (chronic)    Irritable bowel syndrome    Lumbago    Other B-complex deficiencies    Personal history of colonic polyps    adenomatous   Pure hypercholesterolemia    Reflux esophagitis    Tubular adenoma of colon 09/2002   Type II or unspecified type diabetes mellitus without mention of complication, not stated as uncontrolled    Unspecified essential hypertension    Unspecified hypothyroidism    Unspecified iridocyclitis    Unspecified venous (peripheral) insufficiency    Unspecified vitamin D  deficiency    Past Surgical History:  Procedure Laterality Date   BIOPSY  04/24/2018   Procedure: BIOPSY;  Surgeon: Lemar Lofty., MD;  Location: [facility];  Service: Gastroenterology;;   Fidela Salisbury RELEASE     CATARACT EXTRACTION Bilateral 08/29/2017,09/19/17   ESOPHAGOGASTRODUODENOSCOPY (EGD) WITH PROPOFOL N/A 04/24/2018   Procedure: ESOPHAGOGASTRODUODENOSCOPY (EGD) WITH PROPOFOL;  Surgeon: Lemar Lofty., MD;  Location: [facility];  Service: Gastroenterology;  Laterality: N/A;  with EMR   FEMORAL HERNIA REPAIR     OOPHORECTOMY     POLYPECTOMY  04/24/2018   Procedure: POLYPECTOMY;  Surgeon: Mansouraty, Netty Starring., MD;  Location: [facility];  Service: Gastroenterology;;   UPPER ESOPHAGEAL ENDOSCOPIC ULTRASOUND (EUS)  04/24/2018   Procedure: UPPER ESOPHAGEAL ENDOSCOPIC ULTRASOUND (EUS);  Surgeon: Meridee Score Netty Starring., MD;  Location: [facility];  Service: Gastroenterology;;   VESICOVAGINAL FISTULA CLOSURE W/ TAH      reports that she has never smoked. She has never used smokeless tobacco. She reports current alcohol use. She reports that she does not use drugs. family history includes Deep vein thrombosis in her mother; Diabetes in her mother; Healthy in her sister; Hypertension in her mother; Prostate cancer in her father. Allergies  Allergen Reactions   Lisinopril Other (See Comments) and Cough  Cough and throat clearing   Metoprolol Succinate Other (See Comments)    Unknown reaction   Penicillins Swelling    Has patient had a PCN reaction causing immediate rash, facial/tongue/throat swelling, SOB or lightheadedness with hypotension: Yes Has patient had a PCN reaction causing severe rash involving mucus membranes or skin necrosis: No Has patient had a PCN reaction that required hospitalization: No Has patient had a PCN reaction occurring within the last 10 years: No If all of the above answers are "NO", then may proceed with Cephalosporin use.     Sulfamethoxazole Other (See Comments)    Unknown reaction   Sulfamethoxazole-Trimethoprim Other (See Comments)    Unknown reaction   Trimethoprim Other (See Comments)    Unknown reaction   Metformin Nausea Only   Current Outpatient Medications on File Prior to Visit  Medication Sig Dispense Refill   allopurinol (ZYLOPRIM) 100 MG tablet Take 1 tablet (100 mg total) by mouth daily. (Patient taking differently: Take 100 mg by mouth daily. Patient taking every other day) 90 tablet 1   ALPRAZolam (XANAX) 0.5 MG tablet TAKE 1 TABLET BY MOUTH 3 TIMES  DAILY AS NEEDED FOR ANXIETY OR  SLEEP 270 tablet 0   aspirin 81 MG chewable tablet Chew 81 mg by mouth every morning.     Cholecalciferol 50 MCG (2000 UT) TABS Take by mouth.     cyanocobalamin (VITAMIN B12) 1000 MCG/ML injection INJECT 1 ML INTO THE MUSCLE  ONCE EVERY MONTH 3 mL 0   Dulaglutide (TRULICITY) 1.5 MG/0.5ML SOPN Inject 1.5 mg as directed once a week.     famotidine (PEPCID) 20 MG tablet Take 2 tablets by mouth once daily 180 tablet 0   hydrALAZINE (APRESOLINE) 25 MG tablet TAKE 1 TABLET BY MOUTH THREE TIMES DAILY 270 tablet 0   levothyroxine (SYNTHROID) 88 MCG tablet Take 88 mcg by mouth daily before breakfast.     magnesium hydroxide (DULCOLAX) 400 MG/5ML suspension Take 15 mLs by mouth daily as needed for mild constipation.     mirtazapine (REMERON) 7.5 MG tablet TAKE 1 TABLET BY MOUTH AT  BEDTIME 90 tablet 0   Polyethyl Glycol-Propyl Glycol (SYSTANE) 0.4-0.3 % SOLN Place 1 drop into both eyes daily as needed (dry eyes).     rosuvastatin (CRESTOR) 10 MG tablet Take 1 tablet (10 mg total) by mouth every morning. 90 tablet 1   sacubitril-valsartan (ENTRESTO) 24-26 MG Take 1 tablet by mouth twice daily 180 tablet 1   No current facility-administered medications on file prior to visit.        ROS:  All others reviewed and negative.  Objective        PE:  BP 134/84   Pulse 82   Temp 97.8 F (36.6 C) (Temporal)   Ht 5\' 5"  (1.651 m)    Wt 159 lb 4 oz (72.2 kg)   SpO2 94%   BMI 26.50 kg/m                 Constitutional: Pt appears in NAD               HENT: Head: NCAT.                Right Ear: External ear normal.                 Left Ear: External ear normal.                Eyes: . Pupils are equal, round, and  reactive to light. Conjunctivae and EOM are normal               Nose: without d/c or deformity               Neck: Neck supple. Gross normal ROM               Cardiovascular: Normal rate and regular rhythm.                 Pulmonary/Chest: Effort normal and breath sounds without rales or wheezing.                Abd:  Soft, NT, ND, + BS, no organomegaly               Neurological: Pt is alert. At baseline orientation, motor grossly intact               Skin: Skin is warm. No rashes, no other new lesions, LE edema - none               Psychiatric: Pt behavior is normal without agitation   Micro: none  Cardiac tracings I have personally interpreted today:  none  Pertinent Radiological findings (summarize): none   Lab Results  Component Value Date   WBC 5.3 09/29/2022   HGB 13.7 09/29/2022   HCT 38.2 09/29/2022   PLT 183 09/29/2022   GLUCOSE 93 09/29/2022   CHOL 116 09/06/2022   TRIG 44.0 09/06/2022   HDL 79.70 09/06/2022   LDLDIRECT 105.9 07/04/2007   LDLCALC 28 09/06/2022   ALT 18 09/29/2022   AST 29 09/29/2022   NA 141 09/29/2022   K 3.7 09/29/2022   CL 108 09/29/2022   CREATININE 1.08 (H) 09/29/2022   BUN 10 09/29/2022   CO2 29 09/29/2022   TSH 2.95 09/06/2022   HGBA1C 5.3 02/14/2022   MICROALBUR 5.9 (H) 09/06/2022   Assessment/Plan:  Katie [name] is a 77 y.o. Black or African American [2] female with  has a past medical history of Acute venous embolism and thrombosis of unspecified deep vessels of lower extremity (2001), Allergy, Anxiety state, unspecified, Blood transfusion without reported diagnosis, Cataract, Chronic idiopathic constipation (11/02/2016), Clotting disorder (HCC),  Degeneration of cervical intervertebral disc, Gallstones, History of DVT (deep vein thrombosis), Iron deficiency anemia secondary to blood loss (chronic), Irritable bowel syndrome, Lumbago, Other B-complex deficiencies, Personal history of colonic polyps, Pure hypercholesterolemia, Reflux esophagitis, Tubular adenoma of colon (09/2002), Type II or unspecified type diabetes mellitus without mention of complication, not stated as uncontrolled, Unspecified essential hypertension, Unspecified hypothyroidism, Unspecified iridocyclitis, Unspecified venous (peripheral) insufficiency, and Unspecified vitamin D deficiency.  Grief Without worsening depressive illness it seems, ok for grief counseling referral, cont xanax prn  Prediabetes Lab Results  Component Value Date   HGBA1C 5.3 02/14/2022   Stable, pt to continue current medical treatment trulciity 1.5 mg weekly   Essential hypertension BP Readings from Last 3 Encounters:  12/20/22 134/84  12/04/22 112/62  11/22/22 132/88   Stable, pt to continue medical treatment hydralazine 25 tid, entresto    GAD (generalized anxiety disorder) Pt to continue xanax prn  Vitamin D deficiency Last vitamin D Lab Results  Component Value Date   VD25OH 92.49 12/30/2018   Stable, cont oral replacement   Hypothyroidism Lab Results  Component Value Date   TSH 2.95 09/06/2022   Stable, pt to continue levothyroxine 88 mcg qd  Followup: Return if symptoms worsen or fail to improve.  Oliver Barre, MD 12/21/2022  12:48 PM [facility]

## 2022-12-21 ENCOUNTER — Encounter: Payer: Self-pay | Admitting: Internal Medicine

## 2022-12-21 NOTE — Assessment & Plan Note (Signed)
Last vitamin D Lab Results  Component Value Date   VD25OH 92.49 12/30/2018   Stable, cont oral replacement

## 2022-12-21 NOTE — Assessment & Plan Note (Signed)
Pt to continue xanax prn

## 2022-12-21 NOTE — Assessment & Plan Note (Signed)
Lab Results  Component Value Date   TSH 2.95 09/06/2022   Stable, pt to continue levothyroxine 88 mcg qd

## 2022-12-21 NOTE — Assessment & Plan Note (Signed)
Lab Results  Component Value Date   HGBA1C 5.3 02/14/2022   Stable, pt to continue current medical treatment trulciity 1.5 mg weekly

## 2022-12-21 NOTE — Assessment & Plan Note (Addendum)
Without worsening depressive illness it seems, ok for grief counseling referral, cont xanax prn

## 2022-12-21 NOTE — Assessment & Plan Note (Signed)
BP Readings from Last 3 Encounters:  12/20/22 134/84  12/04/22 112/62  11/22/22 132/88   Stable, pt to continue medical treatment hydralazine 25 tid, entresto

## 2023-01-01 ENCOUNTER — Encounter: Payer: Self-pay | Admitting: Internal Medicine

## 2023-01-01 ENCOUNTER — Other Ambulatory Visit: Payer: Self-pay | Admitting: Internal Medicine

## 2023-01-01 DIAGNOSIS — E538 Deficiency of other specified B group vitamins: Secondary | ICD-10-CM

## 2023-01-23 ENCOUNTER — Ambulatory Visit: Payer: Medicare Other

## 2023-01-23 DIAGNOSIS — I429 Cardiomyopathy, unspecified: Secondary | ICD-10-CM

## 2023-01-31 ENCOUNTER — Other Ambulatory Visit: Payer: Self-pay | Admitting: Internal Medicine

## 2023-01-31 DIAGNOSIS — E785 Hyperlipidemia, unspecified: Secondary | ICD-10-CM

## 2023-02-01 NOTE — Progress Notes (Signed)
Called and spoke with patient regarding her echocardiogram results.

## 2023-02-08 ENCOUNTER — Ambulatory Visit: Payer: Medicare Other | Admitting: Cardiology

## 2023-02-15 NOTE — Progress Notes (Signed)
Cardiology Office Note:  .   Date:  02/16/2023  ID:  Katie [name], DOB 1946-02-19, MRN [id number] PCP: Etta Grandchild, MD  [facility] Providers Cardiologist:  None {  History of Present Illness: .   Katie [name] is a 77 y.o. female with a past medical history of cardiomyopathy, history of DVT, GERD, hypertension, chronic kidney disease, hypothyroidism, hyperlipidemia, prediabetes, advanced age, postmenopausal female here for follow-up appointment.  Patient was followed by Dr. Jovita Gamma for valvular heart disease.  However, repeat echocardiogram noted mildly reduced LVEF with grade 1 diastolic dysfunction and elevated left atrial pressures.  Given the change in LVEF we discussed undergoing ischemic workup for reevaluation of ischemic substrate.  However, since she was asymptomatic she wanted to hold off on additional testing such as MPI or coronary CTA and GDMT was uptitrated.  Was seen 08/07/2022 and home blood pressures were well-controlled.  Denied chest pain.  Shortness of breath with effort related to activity/overexertion which was still present but well-controlled.  Patient stated she wanted to focus on taking care of herself however she is the primary caregiver for her husband which requires one-on-one attention.  Today, she tells me that she needs a refill of her hydralazine.  She has not had any issues with any of her medications.  Unfortunately, she lost her husband in June and has been very anxious and stressed since that loss.  Heart rate is elevated today to 100 bpm.  Last visit she was in the 80s.  Blood pressure well-controlled today.  We reviewed her most recent echocardiogram and her LVEF dropped to 20 to 25%.  Previously 40 to 45% a year and a half ago.  Discussed with Dr. Odis Hollingshead over messenger.  We have placed a referral to the CHF clinic.  We have also started her on spironolactone 12.5 mg daily with labs in a week.  Unfortunately, she cannot afford Entresto and has been getting  samples from [facility] cardiology.  We do not have any samples today we have provided her with a co-pay card which she may or may not be eligible for due to having commercial insurance.  We have sent in a losartan prescription for her just in case she is off Entresto for a period of time.  Reports no shortness of breath nor dyspnea on exertion. Reports no chest pain, pressure, or tightness. No edema, orthopnea, PND. Reports no palpitations.    ROS: Pertinent ROS in HPI  Studies Reviewed: Marland Kitchen   EKG Interpretation Date/Time:  Friday February 16 2023 09:03:00 EDT Ventricular Rate:  100 PR Interval:  152 QRS Duration:  80 QT Interval:  344 QTC Calculation: 443 R Axis:   -16  Text Interpretation: Sinus rhythm with Premature supraventricular complexes When compared with ECG of 04-Feb-2002 09:20, Premature supraventricular complexes are now Present Vent. rate has increased BY  35 BPM Nonspecific T wave abnormality has replaced inverted T waves in Inferior leads Confirmed by Jari Favre [phone]) on 02/16/2023 9:22:35 AM    Her echocardiogram from 01/23/2023 was scanned in.  Results show LVEF 20 to 25% where previous LVEF was 40 to 45%.       Physical Exam:   VS:  BP 128/62   Pulse 100   Ht 5\' 5"  (1.651 m)   Wt 164 lb (74.4 kg)   BMI 27.29 kg/m    Wt Readings from Last 3 Encounters:  02/16/23 164 lb (74.4 kg)  12/20/22 159 lb 4 oz (72.2 kg)  12/04/22 161 lb (73  kg)    GEN: Well nourished, well developed in no acute distress NECK: No JVD; No carotid bruits CARDIAC: RRR, no murmurs, rubs, gallops RESPIRATORY:  Clear to auscultation without rales, wheezing or rhonchi  ABDOMEN: Soft, non-tender, non-distended EXTREMITIES:  No edema; No deformity   ASSESSMENT AND PLAN: .   1.  Cardiomyopathy -recent echo 20-25% -We have added spironolactone 12.5 mg daily and ordered labs in a week -low sodium diet  -We have ordered a cardiac MRI for further evaluation of recent change in her  LVEF -Encouraged her to increase her exercise regimen as tolerated  2.  Hypertension -well controlled today -Continue current medications including Entresto (if off due to price will substitute with losartan 25 mg daily), hydralazine 25 mg 3 times a day, aspirin 81 mg daily, Crestor 10 mg daily  3.  Hyperlipidemia   -spring labs  -Last LDL was at goal, 28    Dispo: you can see Dr. Odis Hollingshead in 3 months.  Signed, Sharlene Dory, PA-C

## 2023-02-16 ENCOUNTER — Other Ambulatory Visit: Payer: Self-pay | Admitting: Cardiology

## 2023-02-16 ENCOUNTER — Ambulatory Visit: Payer: Medicare Other | Attending: Cardiology | Admitting: Physician Assistant

## 2023-02-16 ENCOUNTER — Encounter: Payer: Self-pay | Admitting: Physician Assistant

## 2023-02-16 VITALS — BP 128/62 | HR 100 | Ht 65.0 in | Wt 164.0 lb

## 2023-02-16 DIAGNOSIS — I429 Cardiomyopathy, unspecified: Secondary | ICD-10-CM

## 2023-02-16 DIAGNOSIS — E785 Hyperlipidemia, unspecified: Secondary | ICD-10-CM | POA: Diagnosis not present

## 2023-02-16 DIAGNOSIS — I10 Essential (primary) hypertension: Secondary | ICD-10-CM

## 2023-02-16 DIAGNOSIS — I5020 Unspecified systolic (congestive) heart failure: Secondary | ICD-10-CM

## 2023-02-16 MED ORDER — SPIRONOLACTONE 25 MG PO TABS: 12.5000 mg | ORAL_TABLET | Freq: Every day | ORAL | 3 refills | Status: DC

## 2023-02-16 MED ORDER — LOSARTAN POTASSIUM 25 MG PO TABS: 25.0000 mg | ORAL_TABLET | Freq: Every day | ORAL | 3 refills | Status: DC

## 2023-02-16 NOTE — Patient Instructions (Addendum)
Medication Instructions:   IN ONE WEEK START TAKING ON 02-23-23 :  LOSARTAN 25 MG ONCE A DAY   START TAKING:   SPRINOLACTONE 12.5 MG ONCE A DAY   STOP TAKING AND REMOVE THIS MEDICATION FROM YOUR MEDICATION LIST:  ENTRESTO AFTER 02-23-23   *If you need a refill on your cardiac medications before your next appointment, please call your pharmacy*   Lab Work: RETURN IN ONE WEEK FOR BMET    If you have labs (blood work) drawn today and your tests are completely normal, you will receive your results only by: MyChart Message (if you have MyChart) OR A paper copy in the mail If you have any lab test that is abnormal or we need to change your treatment, we will call you to review the results.   Testing/Procedures: Your physician has requested that you have a cardiac MRI. Cardiac MRI uses a computer to create images of your heart as its beating, producing both still and moving pictures of your heart and major blood vessels. For further information please visit [email or web address]. Please follow the instruction sheet given to you today for more information.      Follow-Up: At [facility], you and your health needs are our priority.  As part of our continuing mission to provide you with exceptional heart care, we have created designated Provider Care Teams.  These Care Teams include your primary Cardiologist (physician) and Advanced Practice Providers (APPs -  Physician Assistants and Nurse Practitioners) who all work together to provide you with the care you need, when you need it.  We recommend signing up for the patient portal called "MyChart".  Sign up information is provided on this After Visit Summary.  MyChart is used to connect with patients for Virtual Visits (Telemedicine).  Patients are able to view lab/test results, encounter notes, upcoming appointments, etc.  Non-urgent messages can be sent to your provider as well.   To learn more about what you can do with MyChart, go to  [email or web address].    Your next appointment: You have been [facility] to [facility]      3 month(s)  Provider:  Dr. Odis Hollingshead     Other Instructions

## 2023-02-19 ENCOUNTER — Telehealth: Payer: Self-pay | Admitting: Physician Assistant

## 2023-02-19 NOTE — Telephone Encounter (Signed)
  Pt c/o medication issue:  1. Name of Medication:   hydrALAZINE (APRESOLINE) 25 MG tablet   losartan (COZAAR) 25 MG tablet    spironolactone (ALDACTONE) 25 MG tablet   2. How are you currently taking this medication (dosage and times per day)? As written   3. Are you having a reaction (difficulty breathing--STAT)? No   4. What is your medication issue? Pt would like to ask if she need to taker her hydralazine with her two new prescription, losartan and spironolactone

## 2023-02-19 NOTE — Telephone Encounter (Signed)
Called Pt with Tessa Conte's recommendations. Pt said her Bps had been on the low end and was afraid they would drop too low if she started taking 3. I told Pt to keep track of her Bps and to let us know if BP goes up before her next appointment so we can adjust accordingly.

## 2023-02-19 NOTE — Telephone Encounter (Signed)
Returned pt call. Pt would like to clarify she should be taking hydralazine 25mg  3 times a daily. Pt has been taking it one time daily and though she was told to take it that way. Told pt Jari Favre has in her notes to take 3 times a day but she would like to double check. Will forward to Jari Favre for her review.

## 2023-02-23 ENCOUNTER — Ambulatory Visit: Payer: Medicare Other | Attending: Physician Assistant

## 2023-02-23 DIAGNOSIS — I5020 Unspecified systolic (congestive) heart failure: Secondary | ICD-10-CM

## 2023-02-24 LAB — BASIC METABOLIC PANEL
BUN/Creatinine Ratio: 8 — ABNORMAL LOW (ref 12–28)
BUN: 10 mg/dL (ref 8–27)
CO2: 22 mmol/L (ref 20–29)
Calcium: 9.7 mg/dL (ref 8.7–10.3)
Chloride: 103 mmol/L (ref 96–106)
Creatinine, Ser: 1.22 mg/dL — ABNORMAL HIGH (ref 0.57–1.00)
Glucose: 92 mg/dL (ref 70–99)
Potassium: 4.3 mmol/L (ref 3.5–5.2)
Sodium: 139 mmol/L (ref 134–144)
eGFR: 45 mL/min/{1.73_m2} — ABNORMAL LOW (ref >59)

## 2023-02-26 ENCOUNTER — Ambulatory Visit (INDEPENDENT_AMBULATORY_CARE_PROVIDER_SITE_OTHER): Payer: Medicare Other

## 2023-02-26 VITALS — Ht 65.0 in | Wt 164.0 lb

## 2023-02-26 DIAGNOSIS — Z0000 Encounter for general adult medical examination without abnormal findings: Secondary | ICD-10-CM | POA: Diagnosis not present

## 2023-02-26 NOTE — Patient Instructions (Signed)
Ms. Sexson , Thank you for taking time to come for your Medicare Wellness Visit. I appreciate your ongoing commitment to your health goals. Please review the following plan we discussed and let me know if I can assist you in the future.   Referrals/Orders/Follow-Ups/Clinician Recommendations: No  This is a list of the screening recommended for you and due dates:  Health Maintenance  Topic Date Due   Hemoglobin A1C  08/16/2022   Flu Shot  12/14/2022   Yearly kidney health urinalysis for diabetes  09/06/2023   Complete foot exam   09/06/2023   Eye exam for diabetics  12/26/2023   Yearly kidney function blood test for diabetes  02/23/2024   Medicare Annual Wellness Visit  02/26/2024   DTaP/Tdap/Td vaccine (2 - Td or Tdap) 03/15/2025   Colon Cancer Screening  01/29/2026   Pneumonia Vaccine  Completed   DEXA scan (bone density measurement)  Completed   Hepatitis C Screening  Completed   Zoster (Shingles) Vaccine  Completed   HPV Vaccine  Aged Out   COVID-19 Vaccine  Discontinued    Advanced directives: (Declined) Advance directive discussed with you today. Even though you declined this today, please call our office should you change your mind, and we can give you the proper paperwork for you to fill out.  Next Medicare Annual Wellness Visit scheduled for next year: Yes

## 2023-02-26 NOTE — Progress Notes (Signed)
Subjective:   [name] is a 77 y.o. female who presents for Medicare Annual (Subsequent) preventive examination.  Visit Complete: Virtual I connected with  [name] on 02/26/23 by a audio enabled telemedicine application and verified that I am speaking with the correct person using two identifiers.  Patient Location: Home  Provider Location: Office/Clinic  I discussed the limitations of evaluation and management by telemedicine. The patient expressed understanding and agreed to proceed.  Vital Signs: Because this visit was a virtual/telehealth visit, some criteria may be missing or patient reported. Any vitals not documented were not able to be obtained and vitals that have been documented are patient reported.   Cardiac Risk Factors include: advanced age (>35men, >26 women);family history of premature cardiovascular disease;dyslipidemia;hypertension     Objective:    Today's Vitals   02/26/23 1119  Weight: 164 lb (74.4 kg)  Height: 5\' 5"  (1.651 m)  PainSc: 0-No pain   Body mass index is 27.29 kg/m.     02/26/2023   11:21 AM 02/23/2022   10:51 AM 02/18/2021    4:14 PM 08/11/2020   10:59 AM 10/30/2019   10:03 AM 04/24/2018   10:06 AM 03/28/2018    9:45 AM  Advanced Directives  Does Patient Have a Medical Advance Directive? No No No No Yes Yes No  Type of Advance Directive     Living will Living will   Does patient want to make changes to medical advance directive?     No - Patient declined    Would patient like information on creating a medical advance directive? No - Patient declined No - Patient declined No - Patient declined Yes (MAU/Ambulatory/Procedural Areas - Information given)   Yes (ED - Information included in AVS)    Current Medications (verified) Outpatient Encounter Medications as of 02/26/2023  Medication Sig   allopurinol (ZYLOPRIM) 100 MG tablet Take 1 tablet (100 mg total) by mouth daily. (Patient taking differently: Take 100 mg by mouth daily.  Patient taking every other day)   ALPRAZolam (XANAX) 0.5 MG tablet TAKE 1 TABLET BY MOUTH 3 TIMES  DAILY AS NEEDED FOR ANXIETY OR  SLEEP   aspirin 81 MG chewable tablet Chew 81 mg by mouth every morning.   Cholecalciferol 50 MCG (2000 UT) TABS Take by mouth.   cyanocobalamin (VITAMIN B12) 1000 MCG/ML injection INJECT 1 ML INTO THE MUSCLE ONCE EVERY MONTH   Dulaglutide (TRULICITY) 1.5 MG/0.5ML SOPN Inject 1.5 mg as directed once a week.   famotidine (PEPCID) 20 MG tablet Take 2 tablets by mouth once daily   hydrALAZINE (APRESOLINE) 25 MG tablet TAKE 1 TABLET BY MOUTH THREE TIMES DAILY   levothyroxine (SYNTHROID) 88 MCG tablet Take 88 mcg by mouth daily before breakfast.   losartan (COZAAR) 25 MG tablet Take 1 tablet (25 mg total) by mouth daily.   magnesium hydroxide (DULCOLAX) 400 MG/5ML suspension Take 15 mLs by mouth daily as needed for mild constipation.   mirtazapine (REMERON) 7.5 MG tablet TAKE 1 TABLET BY MOUTH AT  BEDTIME   Polyethyl Glycol-Propyl Glycol (SYSTANE) 0.4-0.3 % SOLN Place 1 drop into both eyes daily as needed (dry eyes).   rosuvastatin (CRESTOR) 10 MG tablet TAKE 1 TABLET BY MOUTH EVERY  MORNING   spironolactone (ALDACTONE) 25 MG tablet Take 0.5 tablets (12.5 mg total) by mouth daily.   No facility-administered encounter medications on file as of 02/26/2023.    Allergies (verified) Lisinopril, Metoprolol succinate, Penicillins, Sulfamethoxazole, Sulfamethoxazole-trimethoprim, Trimethoprim, and Metformin   History: Past  Medical History:  Diagnosis Date   Acute venous embolism and thrombosis of unspecified deep vessels of lower extremity 2001   RLE DVT and PE, coumadin x 45mo, now ASSA   Allergy    Anxiety state, unspecified    Blood transfusion without reported diagnosis    Cataract    Chronic idiopathic constipation 11/02/2016   Clotting disorder (HCC)    Degeneration of cervical intervertebral disc    Gallstones    History of DVT (deep vein thrombosis)    Iron  deficiency anemia secondary to blood loss (chronic)    Irritable bowel syndrome    Lumbago    Other B-complex deficiencies    Personal history of colonic polyps    adenomatous   Pure hypercholesterolemia    Reflux esophagitis    Tubular adenoma of colon 09/2002   Type II or unspecified type diabetes mellitus without mention of complication, not stated as uncontrolled    Unspecified essential hypertension    Unspecified hypothyroidism    Unspecified iridocyclitis    Unspecified venous (peripheral) insufficiency    Unspecified vitamin D deficiency    Past Surgical History:  Procedure Laterality Date   BIOPSY  04/24/2018   Procedure: BIOPSY;  Surgeon: Lemar Lofty., MD;  Location: [facility];  Service: Gastroenterology;;   Fidela Salisbury RELEASE     CATARACT EXTRACTION Bilateral 08/29/2017,09/19/17   ESOPHAGOGASTRODUODENOSCOPY (EGD) WITH PROPOFOL N/A 04/24/2018   Procedure: ESOPHAGOGASTRODUODENOSCOPY (EGD) WITH PROPOFOL;  Surgeon: Lemar Lofty., MD;  Location: [facility];  Service: Gastroenterology;  Laterality: N/A;  with EMR   FEMORAL HERNIA REPAIR     OOPHORECTOMY     POLYPECTOMY  04/24/2018   Procedure: POLYPECTOMY;  Surgeon: Mansouraty, Netty Starring., MD;  Location: [facility];  Service: Gastroenterology;;   UPPER ESOPHAGEAL ENDOSCOPIC ULTRASOUND (EUS)  04/24/2018   Procedure: UPPER ESOPHAGEAL ENDOSCOPIC ULTRASOUND (EUS);  Surgeon: Meridee Score, Netty Starring., MD;  Location: [facility];  Service: Gastroenterology;;   VESICOVAGINAL FISTULA CLOSURE W/ TAH     Family History  Problem Relation Age of Onset   Prostate cancer Father    Diabetes Mother        retinopathy/blind   Deep vein thrombosis Mother    Hypertension Mother    Healthy Sister    Colon cancer Neg Hx    Esophageal cancer Neg Hx    Stomach cancer Neg Hx    Rectal cancer Neg Hx    Social History   Socioeconomic History   Marital status: Married    Spouse name: Gabrial Poppell   Number of  children: 0   Years of education: masters   Highest education level: Not on file  Occupational History   Occupation: record Chief Executive Officer: [employer]  Tobacco Use   Smoking status: Never   Smokeless tobacco: Never  Vaping Use   Vaping status: Never Used  Substance and Sexual Activity   Alcohol use: Yes    Alcohol/week: 0.0 standard drinks of alcohol    Comment: occasional-wine    Drug use: No   Sexual activity: Not Currently  Other Topics Concern   Not on file  Social History Narrative   Lives with husband in a 2 story home with a basement.  Has no children.     Retired from the police department.     Education: masters (guidance and counseling).    Social Determinants of Health   Financial Resource Strain: Low Risk  (02/26/2023)   Overall Financial Resource Strain (CARDIA)  Difficulty of Paying Living Expenses: Not hard at all  Food Insecurity: No Food Insecurity (02/26/2023)   Hunger Vital Sign    Worried About Running Out of Food in the Last Year: Never true    Ran Out of Food in the Last Year: Never true  Transportation Needs: No Transportation Needs (02/26/2023)   PRAPARE - Administrator, Civil Service (Medical): No    Lack of Transportation (Non-Medical): No  Physical Activity: Sufficiently Active (02/26/2023)   Exercise Vital Sign    Days of Exercise per Week: 3 days    Minutes of Exercise per Session: 60 min  Stress: No Stress Concern Present (02/26/2023)   [employer] of Occupational Health - Occupational Stress Questionnaire    Feeling of Stress : Not at all  Social Connections: Socially Integrated (02/26/2023)   Social Connection and Isolation Panel [NHANES]    Frequency of Communication with Friends and Family: More than three times a week    Frequency of Social Gatherings with Friends and Family: More than three times a week    Attends Religious Services: More than 4 times per year    Active Member of [employer] or Organizations:  Yes    Attends Engineer, structural: More than 4 times per year    Marital Status: Married    Tobacco Counseling Counseling given: Not Answered   Clinical Intake:  Pre-visit preparation completed: Yes  Pain : No/denies pain Pain Score: 0-No pain     BMI - recorded: 27.29 Nutritional Status: BMI 25 -29 Overweight Nutritional Risks: None Diabetes: No  How often do you need to have someone help you when you read instructions, pamphlets, or other written materials from your doctor or pharmacy?: 1 - Never What is the last grade level you completed in school?: MASTER'S DEGREE  Interpreter Needed?: No  Information entered by :: Dashauna Heymann N. Wai Litt, LPN.   Activities of Daily Living    02/26/2023   11:25 AM  In your present state of health, do you have any difficulty performing the following activities:  Hearing? 0  Vision? 0  Difficulty concentrating or making decisions? 0  Walking or climbing stairs? 0  Dressing or bathing? 0  Doing errands, shopping? 0  Preparing Food and eating ? N  Using the Toilet? N  In the past six months, have you accidently leaked urine? N  Do you have problems with loss of bowel control? N  Managing your Medications? N  Managing your Finances? N  Housekeeping or managing your Housekeeping? N    Patient Care Team: Etta Grandchild, MD as PCP - General (Internal Medicine) Altheimer, Casimiro Needle, MD (Endocrinology) Meryl Dare, MD (Gastroenterology) Larey Dresser, DPM (Podiatry) Jethro Bolus, MD (Ophthalmology) Chevis Pretty, Dimas Aguas, MD (Gynecology) Janalyn Harder, MD (Inactive) as Consulting Physician (Dermatology)  Indicate any recent Medical Services you may have received from other than [facility] providers in the past year (date may be approximate).     Assessment:   This is a routine wellness examination for Katie [name].  Hearing/Vision screen Hearing Screening - Comments:: Patient denied any hearing difficulty.   No hearing  aids.  Vision Screening - Comments:: Patient does wear corrective lenses/contacts.  Annual eye exam done by: [facility]    Goals Addressed             This Visit's Progress    To get back into the gym.        Depression Screen    02/26/2023   11:24  AM 02/23/2022   10:55 AM 02/18/2021    4:12 PM 10/30/2019   10:04 AM 12/30/2018    4:25 PM 03/28/2018    9:45 AM 03/26/2017   11:38 AM  PHQ 2/9 Scores  PHQ - 2 Score 1 1 0 0 0 0 1  PHQ- 9 Score 1     2 2     Fall Risk    02/26/2023   11:22 AM 02/23/2022   10:52 AM 10/05/2021    2:40 PM 02/18/2021    4:14 PM 12/13/2020    9:26 AM  Fall Risk   Falls in the past year? 0 0 0 0 0  Comment   denies falls x 12 months; does not use assistive devices    Number falls in past yr: 0 0 0 0   Injury with Fall? 0 0 0 0   Risk for fall due to : No Fall Risks No Fall Risks No Fall Risks No Fall Risks   Follow up Falls prevention discussed Falls prevention discussed Falls prevention discussed Falls evaluation completed     MEDICARE RISK AT HOME: Medicare Risk at Home Any stairs in or around the home?: Yes If so, are there any without handrails?: No Home free of loose throw rugs in walkways, pet beds, electrical cords, etc?: Yes Adequate lighting in your home to reduce risk of falls?: Yes Life alert?: No Use of a cane, walker or w/c?: No Grab bars in the bathroom?: Yes Shower chair or bench in shower?: No Elevated toilet seat or a handicapped toilet?: Yes  TIMED UP AND GO:  Was the test performed?  No    Cognitive Function:    03/26/2017   11:52 AM 02/16/2016   10:58 AM  MMSE - Mini Mental State Exam  Not completed:  --  Orientation to time 5   Orientation to Place 5   Registration 3   Attention/ Calculation 4   Recall 2   Language- name 2 objects 2   Language- repeat 1   Language- follow 3 step command 3   Language- read & follow direction 1   Write a sentence 1   Copy design 1   Total score 28          02/26/2023   11:31 AM 02/23/2022   10:54 AM 10/30/2019   10:05 AM  6CIT Screen  What Year? 0 points 0 points 0 points  What month? 0 points 0 points 0 points  What time? 0 points 0 points 0 points  Count back from 20 0 points 0 points 0 points  Months in reverse 0 points 0 points 0 points  Repeat phrase 0 points 0 points 0 points  Total Score 0 points 0 points 0 points    Immunizations Immunization History  Administered Date(s) Administered   Fluad Quad(high Dose 65+) 01/25/2021   Influenza Split 02/28/2012, 02/15/2013, 01/27/2014   Influenza Whole 02/13/2008, 04/07/2009   Influenza, High Dose Seasonal PF 01/29/2018, 02/02/2019   Influenza-Unspecified 03/01/2017, 01/28/2020, 01/24/2022   PFIZER(Purple Top)SARS-COV-2 Vaccination 06/21/2019, 07/16/2019, 03/27/2020   Pfizer Covid-19 Vaccine Bivalent Booster 7yrs & up 04/18/2021   Pfizer(Comirnaty)Fall Seasonal Vaccine 12 years and older 01/22/2023   Pneumococcal Conjugate-13 08/11/2014   Pneumococcal Polysaccharide-23 05/31/2011, 06/18/2017   RSV,unspecified 04/24/2022   Tdap 03/16/2015   Zoster Recombinant(Shingrix) 01/26/2017, 05/09/2017    TDAP status: Up to date  Flu Vaccine status: Due, Education has been provided regarding the importance of this vaccine. Advised may receive this vaccine at local  pharmacy or Health Dept. Aware to provide a copy of the vaccination record if obtained from local pharmacy or Health Dept. Verbalized acceptance and understanding.  Pneumococcal vaccine status: Up to date  Covid-19 vaccine status: Completed vaccines  Qualifies for Shingles Vaccine? Yes   Zostavax completed No   Shingrix Completed?: Yes  Screening Tests Health Maintenance  Topic Date Due   HEMOGLOBIN A1C  08/16/2022   INFLUENZA VACCINE  12/14/2022   Diabetic kidney evaluation - Urine ACR  09/06/2023   FOOT EXAM  09/06/2023   OPHTHALMOLOGY EXAM  12/26/2023   Diabetic kidney evaluation - eGFR measurement  02/23/2024    Medicare Annual Wellness (AWV)  02/26/2024   DTaP/Tdap/Td (2 - Td or Tdap) 03/15/2025   Colonoscopy  01/29/2026   Pneumonia Vaccine 68+ Years old  Completed   DEXA SCAN  Completed   Hepatitis C Screening  Completed   Zoster Vaccines- Shingrix  Completed   HPV VACCINES  Aged Out   COVID-19 Vaccine  Discontinued    Health Maintenance  Health Maintenance Due  Topic Date Due   HEMOGLOBIN A1C  08/16/2022   INFLUENZA VACCINE  12/14/2022    Colorectal cancer screening: Type of screening: Colonoscopy. Completed 01/30/2019. Repeat every 7 years  Mammogram status: Completed 06/13/2022. Repeat every year  Bone Density status: Completed 11/13/2016. Results reflect: Bone density results: NORMAL. Repeat every 5 years.  Lung Cancer Screening: (Low Dose CT Chest recommended if Age 15-80 years, 20 pack-year currently smoking OR have quit w/in 15years.) does not qualify.   Lung Cancer Screening Referral: no  Additional Screening:  Hepatitis C Screening: does qualify; Completed 09/20/2016  Vision Screening: Recommended annual ophthalmology exams for early detection of glaucoma and other disorders of the eye. Is the patient up to date with their annual eye exam?  Yes  Who is the provider or what is the name of the office in which the patient attends annual eye exams? [facility] If pt is not established with a provider, would they like to be referred to a provider to establish care? No .   Dental Screening: Recommended annual dental exams for proper oral hygiene  Diabetic Foot Exam: Diabetic Foot Exam: Completed 09/06/2022  Community Resource Referral / Chronic Care Management: CRR required this visit?  No   CCM required this visit?  No     Plan:     I have personally reviewed and noted the following in the patient's chart:   Medical and social history Use of alcohol, tobacco or illicit drugs  Current medications and supplements including opioid prescriptions. Patient is not  currently taking opioid prescriptions. Functional ability and status Nutritional status Physical activity Advanced directives List of other physicians Hospitalizations, surgeries, and ER visits in previous 12 months Vitals Screenings to include cognitive, depression, and falls Referrals and appointments  In addition, I have reviewed and discussed with patient certain preventive protocols, quality metrics, and best practice recommendations. A written personalized care plan for preventive services as well as general preventive health recommendations were provided to patient.     Mickeal Needy, LPN   16/02/9603   After Visit Summary: (MyChart) Due to this being a telephonic visit, the after visit summary with patients personalized plan was offered to patient via MyChart   Nurse Notes: Normal cognitive status assessed by direct observation via telephone conversation by this Nurse Health Advisor. No abnormalities found.

## 2023-03-07 ENCOUNTER — Encounter: Payer: Self-pay | Admitting: Internal Medicine

## 2023-03-07 ENCOUNTER — Ambulatory Visit: Payer: Medicare Other | Admitting: Internal Medicine

## 2023-03-07 VITALS — BP 142/86 | HR 85 | Temp 98.1°F | Ht 65.0 in | Wt 162.0 lb

## 2023-03-07 DIAGNOSIS — F411 Generalized anxiety disorder: Secondary | ICD-10-CM | POA: Diagnosis not present

## 2023-03-07 DIAGNOSIS — E559 Vitamin D deficiency, unspecified: Secondary | ICD-10-CM

## 2023-03-07 DIAGNOSIS — I10 Essential (primary) hypertension: Secondary | ICD-10-CM | POA: Diagnosis not present

## 2023-03-07 MED ORDER — MIRTAZAPINE 15 MG PO TABS
15.0000 mg | ORAL_TABLET | Freq: Every day | ORAL | 1 refills | Status: DC
Start: 2023-03-07 — End: 2023-05-25

## 2023-03-07 NOTE — Progress Notes (Signed)
Subjective:  Patient ID: Katie [name], female    DOB: 1946/04/03  Age: 77 y.o. MRN: [id number]  CC: Hypertension and Hypothyroidism   HPI Katie [name] presents for f/up ----  She feels emotionally stressed from losing several family members over the last year.  She is active and denies chest pain, shortness of breath, diaphoresis, or edema.  Outpatient Medications Prior to Visit  Medication Sig Dispense Refill   allopurinol (ZYLOPRIM) 100 MG tablet Take 1 tablet (100 mg total) by mouth daily. 90 tablet 1   ALPRAZolam (XANAX) 0.5 MG tablet TAKE 1 TABLET BY MOUTH 3 TIMES  DAILY AS NEEDED FOR ANXIETY OR  SLEEP 270 tablet 0   aspirin 81 MG chewable tablet Chew 81 mg by mouth every morning.     cyanocobalamin (VITAMIN B12) 1000 MCG/ML injection INJECT 1 ML INTO THE MUSCLE ONCE EVERY MONTH 3 mL 0   Dulaglutide (TRULICITY) 1.5 MG/0.5ML SOPN Inject 1.5 mg as directed once a week.     famotidine (PEPCID) 20 MG tablet Take 2 tablets by mouth once daily (Patient taking differently: Take 20 mg by mouth daily.) 180 tablet 0   hydrALAZINE (APRESOLINE) 25 MG tablet TAKE 1 TABLET BY MOUTH THREE TIMES DAILY 270 tablet 3   levothyroxine (SYNTHROID) 88 MCG tablet Take 88 mcg by mouth daily before breakfast.     losartan (COZAAR) 25 MG tablet Take 1 tablet (25 mg total) by mouth daily. 90 tablet 3   magnesium hydroxide (DULCOLAX) 400 MG/5ML suspension Take 15 mLs by mouth daily as needed for mild constipation.     Polyethyl Glycol-Propyl Glycol (SYSTANE) 0.4-0.3 % SOLN Place 1 drop into both eyes daily as needed (dry eyes).     rosuvastatin (CRESTOR) 10 MG tablet TAKE 1 TABLET BY MOUTH EVERY  MORNING 90 tablet 0   spironolactone (ALDACTONE) 25 MG tablet Take 0.5 tablets (12.5 mg total) by mouth daily. 45 tablet 3   Cholecalciferol 50 MCG (2000 UT) TABS Take by mouth.     mirtazapine (REMERON) 7.5 MG tablet TAKE 1 TABLET BY MOUTH AT  BEDTIME 90 tablet 0   No facility-administered medications prior to visit.     ROS Review of Systems  Constitutional:  Negative for diaphoresis and fatigue.  HENT: Negative.    Eyes:  Negative for visual disturbance.  Respiratory:  Negative for cough, chest tightness, shortness of breath and wheezing.   Cardiovascular:  Negative for chest pain, palpitations and leg swelling.  Gastrointestinal:  Negative for abdominal pain, constipation, diarrhea, nausea and vomiting.  Genitourinary: Negative.  Negative for difficulty urinating.  Musculoskeletal: Negative.  Negative for arthralgias and myalgias.  Skin: Negative.   Neurological: Negative.  Negative for dizziness.  Hematological:  Negative for adenopathy. Does not bruise/bleed easily.  Psychiatric/Behavioral:  Positive for confusion, decreased concentration, dysphoric mood and sleep disturbance. Negative for suicidal ideas. The patient is nervous/anxious.     Objective:  BP (!) 142/86 (BP Location: Left Arm, Patient Position: Sitting, Cuff Size: Normal)   Pulse 85   Temp 98.1 F (36.7 C) (Oral)   Ht 5\' 5"  (1.651 m)   Wt 162 lb (73.5 kg)   SpO2 99%   BMI 26.96 kg/m   BP Readings from Last 3 Encounters:  03/07/23 (!) 142/86  02/16/23 128/62  12/20/22 134/84    Wt Readings from Last 3 Encounters:  03/07/23 162 lb (73.5 kg)  02/26/23 164 lb (74.4 kg)  02/16/23 164 lb (74.4 kg)    Physical Exam Vitals reviewed.  Constitutional:      Appearance: She is not ill-appearing.  HENT:     Mouth/Throat:     Mouth: Mucous membranes are moist.  Eyes:     General: No scleral icterus.    Conjunctiva/sclera: Conjunctivae normal.  Cardiovascular:     Rate and Rhythm: Normal rate and regular rhythm.     Heart sounds: No murmur heard.    No friction rub. No gallop.  Pulmonary:     Effort: Pulmonary effort is normal.     Breath sounds: No stridor. No wheezing, rhonchi or rales.  Abdominal:     General: Abdomen is flat.     Palpations: There is no mass.     Tenderness: There is no abdominal tenderness.  There is no guarding.     Hernia: No hernia is present.  Musculoskeletal:        General: Normal range of motion.     Cervical back: Neck supple.     Right lower leg: No edema.     Left lower leg: No edema.  Lymphadenopathy:     Cervical: No cervical adenopathy.  Skin:    General: Skin is warm and dry.  Neurological:     General: No focal deficit present.     Mental Status: She is alert. Mental status is at baseline.  Psychiatric:        Attention and Perception: She is inattentive.        Mood and Affect: Mood is anxious and depressed. Affect is flat. Affect is not tearful.        Speech: Speech is delayed and tangential.        Behavior: Behavior normal. Behavior is cooperative.        Thought Content: Thought content is not paranoid or delusional. Thought content does not include homicidal or suicidal ideation.        Cognition and Memory: Cognition normal.     Lab Results  Component Value Date   WBC 5.3 09/29/2022   HGB 13.7 09/29/2022   HCT 38.2 09/29/2022   PLT 183 09/29/2022   GLUCOSE 92 02/23/2023   CHOL 116 09/06/2022   TRIG 44.0 09/06/2022   HDL 79.70 09/06/2022   LDLDIRECT 105.9 07/04/2007   LDLCALC 28 09/06/2022   ALT 18 09/29/2022   AST 29 09/29/2022   NA 139 02/23/2023   K 4.3 02/23/2023   CL 103 02/23/2023   CREATININE 1.22 (H) 02/23/2023   BUN 10 02/23/2023   CO2 22 02/23/2023   TSH 2.53 10/10/2022   HGBA1C 5.6 10/10/2022   MICROALBUR 5.9 (H) 09/06/2022    US ABDOMEN LIMITED RUQ (LIVER/GB)  Result Date: 11/15/2022 CLINICAL DATA:  Elevated bilirubin EXAM: ULTRASOUND ABDOMEN LIMITED RIGHT UPPER QUADRANT COMPARISON:  Abdominal ultrasound 02/06/2018 FINDINGS: Gallbladder: Multiple shadowing gallstones. No gallbladder wall thickening. No sonographic Murphy sign noted by sonographer. Common bile duct: Diameter: 0.5 cm, within normal limits Liver: No focal lesion identified. Nonspecific coarsening of the hepatic echotexture. Within normal limits in  parenchymal echogenicity. Portal vein is patent on color Doppler imaging with normal direction of blood flow towards the liver. Other: None. IMPRESSION: 1. Cholelithiasis without sonographic evidence of acute cholecystitis. 2. Nonspecific coarsening of the hepatic echotexture. No focal lesion. Electronically Signed   By: Emmaline Kluver M.D.   On: 11/15/2022 12:39    Assessment & Plan:   Vitamin D deficiency -     Cholecalciferol; Take 1 tablet (2,000 Units total) by mouth daily.  Dispense: 90 tablet; Refill: 1  Essential hypertension- Her BP is adequately well controlled.  GAD (generalized anxiety disorder)- Will increase the dose of mirtazapine. -     Mirtazapine; Take 1 tablet (15 mg total) by mouth at bedtime.  Dispense: 90 tablet; Refill: 1     Follow-up: Return in about 6 months (around 09/05/2023).  Sanda Linger, MD

## 2023-03-07 NOTE — Patient Instructions (Signed)
Managing Anxiety, Adult  After being diagnosed with anxiety, you may be relieved to know why you have felt or behaved a certain way. You may also feel overwhelmed about the treatment ahead and what it will mean for your life. With care and support, you can manage your anxiety.  How to manage lifestyle changes  Understanding the difference between stress and anxiety  Although stress can play a role in anxiety, it is not the same as anxiety. Stress is your body's reaction to life changes and events, both good and bad. Stress is often caused by something external, such as a deadline, test, or competition. It normally goes away after the event has ended and will last just a few hours. But, stress can be ongoing and can lead to more than just stress.  Anxiety is caused by something internal, such as imagining a terrible outcome or worrying that something will go wrong that will greatly upset you. Anxiety often does not go away even after the event is over, and it can become a long-term (chronic) worry.  Lowering stress and anxiety    Talk with your health care provider or a counselor to learn more about lowering anxiety and stress. They may suggest tension-reduction techniques, such as:  Music. Spend time creating or listening to music that you enjoy and that inspires you.  Mindfulness-based meditation. Practice being aware of your normal breaths while not trying to control your breathing. It can be done while sitting or walking.  Centering prayer. Focus on a word, phrase, or sacred image that means something to you and brings you peace.  Deep breathing. Expand your stomach and inhale slowly through your nose. Hold your breath for 3-5 seconds. Then breathe out slowly, letting your stomach muscles relax.  Self-talk. Learn to notice and spot thought patterns that lead to anxiety reactions. Change those patterns to thoughts that feel peaceful.  Muscle relaxation. Take time to tense muscles and then relax them.  Choose a  tension-reduction technique that fits your lifestyle and personality. These techniques take time and practice. Set aside 5-15 minutes a day to do them. Specialized therapists can offer counseling and training in these techniques. The training to help with anxiety may be covered by some insurance plans.  Other things you can do to manage stress and anxiety include:  Keeping a stress diary. This can help you learn what triggers your reaction and then learn ways to manage your response.  Thinking about how you react to certain situations. You may not be able to control everything, but you can control your response.  Making time for activities that help you relax and not feeling guilty about spending your time in this way.  Doing visual imagery. This involves imagining or creating mental pictures to help you relax.  Practicing yoga. Through yoga poses, you can lower tension and relax.     Medicines  Medicines for anxiety include:  Antidepressant medicines. These are usually prescribed for long-term daily control.  Anti-anxiety medicines. These may be added in severe cases, especially when panic attacks occur.  When used together, medicines, psychotherapy, and tension-reduction techniques may be the most effective treatment.  Relationships  Relationships can play a big part in helping you recover. Spend more time connecting with trusted friends and family members. Think about going to couples counseling if you have a partner, taking family education classes, or going to family therapy. Therapy can help you and others better understand your anxiety.  How to recognize changes in  your anxiety  Everyone responds differently to treatment for anxiety. Recovery from anxiety happens when symptoms lessen and stop interfering with your daily life at home or work. This may mean that you will start to:  Have better concentration and focus. Worry will interfere less in your daily thinking.  Sleep better.  Be less irritable.  Have  more energy.  Have improved memory.  Try to recognize when your condition is getting worse. Contact your provider if your symptoms interfere with home or work and you feel like your condition is not improving.  Follow these instructions at home:  Activity  Exercise. Adults should:  Exercise for at least 150 minutes each week. The exercise should increase your heart rate and make you sweat (moderate-intensity exercise).  Do strengthening exercises at least twice a week.  Get the right amount and quality of sleep. Most adults need 7-9 hours of sleep each night.  Lifestyle    Eat a healthy diet that includes plenty of vegetables, fruits, whole grains, low-fat dairy products, and lean protein.  Do not eat a lot of foods that are high in fats, added sugars, or salt (sodium).  Make choices that simplify your life.  Do not use any products that contain nicotine or tobacco. These products include cigarettes, chewing tobacco, and vaping devices, such as e-cigarettes. If you need help quitting, ask your provider.  Avoid caffeine, alcohol, and certain over-the-counter cold medicines. These may make you feel worse. Ask your pharmacist which medicines to avoid.  General instructions  Take over-the-counter and prescription medicines only as told by your provider.  Keep all follow-up visits. This is to make sure you are managing your anxiety well or if you need more support.  Where to find support  You can get help and support from:  Self-help groups.  Online and [employer].  A trusted spiritual leader.  Couples counseling.  Family education classes.  Family therapy.  Where to find more information  You may find that joining a support group helps you deal with your anxiety. The following sources can help you find counselors or support groups near you:  Mental Health America: mentalhealthamerica.net  Anxiety and Depression Association of [place] (ADAA): adaa.org  The [employer] on Mental Illness (NAMI):  nami.org  Contact a health care provider if:  You have a hard time staying focused or finishing tasks.  You spend many hours a day feeling worried about everyday life.  You are very tired because you cannot stop worrying.  You start to have headaches or often feel tense.  You have chronic nausea or diarrhea.  Get help right away if:  Your heart feels like it is racing.  You have shortness of breath.  You have thoughts of hurting yourself or others.  Get help right away if you feel like you may hurt yourself or others, or have thoughts about taking your own life. Go to your nearest emergency room or:  Call 911.  Call the National Suicide Prevention Lifeline at 417-380-0019 or 988. This is open 24 hours a day.  Text the Crisis Text Line at 4151481703.  This information is not intended to replace advice given to you by your health care provider. Make sure you discuss any questions you have with your health care provider.  Document Revised: 02/07/2022 Document Reviewed: 08/22/2020  Elsevier Patient Education  2024 [employer].

## 2023-03-09 MED ORDER — CHOLECALCIFEROL 50 MCG (2000 UT) PO TABS
1.0000 | ORAL_TABLET | Freq: Every day | ORAL | 1 refills | Status: AC
Start: 2023-03-09 — End: 2024-03-08

## 2023-03-13 ENCOUNTER — Ambulatory Visit (HOSPITAL_COMMUNITY)
Admission: RE | Admit: 2023-03-13 | Discharge: 2023-03-13 | Disposition: A | Discharge status: Home / Self Care | Payer: Medicare Other | Source: Ambulatory Visit | Attending: Internal Medicine | Admitting: Internal Medicine

## 2023-03-13 ENCOUNTER — Other Ambulatory Visit (HOSPITAL_COMMUNITY): Payer: Self-pay

## 2023-03-13 ENCOUNTER — Encounter (HOSPITAL_COMMUNITY): Payer: Self-pay | Admitting: Internal Medicine

## 2023-03-13 VITALS — BP 142/86 | HR 98 | Wt 161.2 lb

## 2023-03-13 DIAGNOSIS — Z86718 Personal history of other venous thrombosis and embolism: Secondary | ICD-10-CM | POA: Insufficient documentation

## 2023-03-13 DIAGNOSIS — E785 Hyperlipidemia, unspecified: Secondary | ICD-10-CM | POA: Insufficient documentation

## 2023-03-13 DIAGNOSIS — K219 Gastro-esophageal reflux disease without esophagitis: Secondary | ICD-10-CM | POA: Diagnosis not present

## 2023-03-13 DIAGNOSIS — D472 Monoclonal gammopathy: Secondary | ICD-10-CM | POA: Insufficient documentation

## 2023-03-13 DIAGNOSIS — R7303 Prediabetes: Secondary | ICD-10-CM | POA: Diagnosis not present

## 2023-03-13 DIAGNOSIS — I10 Essential (primary) hypertension: Secondary | ICD-10-CM | POA: Diagnosis not present

## 2023-03-13 DIAGNOSIS — I5020 Unspecified systolic (congestive) heart failure: Secondary | ICD-10-CM | POA: Diagnosis present

## 2023-03-13 DIAGNOSIS — I130 Hypertensive heart and chronic kidney disease with heart failure and stage 1 through stage 4 chronic kidney disease, or unspecified chronic kidney disease: Secondary | ICD-10-CM | POA: Insufficient documentation

## 2023-03-13 DIAGNOSIS — Z634 Disappearance and death of family member: Secondary | ICD-10-CM | POA: Insufficient documentation

## 2023-03-13 DIAGNOSIS — I5022 Chronic systolic (congestive) heart failure: Secondary | ICD-10-CM | POA: Diagnosis not present

## 2023-03-13 DIAGNOSIS — N189 Chronic kidney disease, unspecified: Secondary | ICD-10-CM | POA: Diagnosis not present

## 2023-03-13 MED ORDER — EMPAGLIFLOZIN 10 MG PO TABS: 10.0000 mg | ORAL_TABLET | Freq: Every day | ORAL | 11 refills | Status: DC

## 2023-03-13 NOTE — Progress Notes (Signed)
ADVANCED HF CLINIC CONSULT NOTE  Referring Physician: Etta Grandchild, MD Primary Care: Etta Grandchild, MD Primary Cardiologist: Dr. Odis Hollingshead  HPI:  Katie [name] is a 77 y.o. female with a past medical history of cardiomyopathy, history of DVT, GERD, hypertension, chronic kidney disease, hypothyroidism, hyperlipidemia, prediabetes and MGUS.  She has been followed by Dr. Odis Hollingshead for progressively falling EF. She has been resistant to a formal w/u. Referred by Dr. Odis Hollingshead for HF evaluation.    Echo 1/22 EF 59% Lexiscan  2/22: EF 42% normal perfusion Echo 3/23 EF 45-50% Echo 9/24 EF 20-25%    Was seen 08/07/2022 and home blood pressures were well-controlled.  Denied chest pain.  Shortness of breath with effort related to activity/overexertion which was still present but well-controlled.  Patient stated she wanted to focus on taking care of herself however she is the primary caregiver for her husband which requires one-on-one attention.   Bone marrow biopsy demonstrated small clone of plasma cells measuring about 3 to 5%, normal FISH testing, normal cytogenetics. 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  Here with her friend. Under a lot of stress lately. Has lost her husband (died in 11/01/2022 from esophageal CA), sister (died this month from pancreatic CA) and cousin in the last year. Still struggling with grief. Says she feels pretty good. Was going to the gym 3x/week but hasn't gone much since January due to husband's illness. Able to do all ADLs without CP or SOB. Saw Jari Favre recently and cMRI ordered    Past Medical History:  Diagnosis Date   Acute venous embolism and thrombosis of unspecified deep vessels of lower extremity 2001   RLE DVT and PE, coumadin x 41mo, now ASSA   Allergy    Anxiety state, unspecified    Blood transfusion without reported diagnosis    Cataract    Chronic idiopathic constipation 11/02/2016   Clotting disorder (HCC)    Degeneration of cervical  intervertebral disc    Gallstones    History of DVT (deep vein thrombosis)    Iron deficiency anemia secondary to blood loss (chronic)    Irritable bowel syndrome    Lumbago    Other B-complex deficiencies    Personal history of colonic polyps    adenomatous   Pure hypercholesterolemia    Reflux esophagitis    Tubular adenoma of colon 09/2002   Type II or unspecified type diabetes mellitus without mention of complication, not stated as uncontrolled    Unspecified essential hypertension    Unspecified hypothyroidism    Unspecified iridocyclitis    Unspecified venous (peripheral) insufficiency    Unspecified vitamin D deficiency     Current Outpatient Medications  Medication Sig Dispense Refill   allopurinol (ZYLOPRIM) 100 MG tablet Take 1 tablet (100 mg total) by mouth daily. 90 tablet 1   ALPRAZolam (XANAX) 0.5 MG tablet TAKE 1 TABLET BY MOUTH 3 TIMES  DAILY AS NEEDED FOR ANXIETY OR  SLEEP 270 tablet 0   aspirin 81 MG chewable tablet Chew 81 mg by mouth every morning.     CEQUA 0.09 % SOLN Apply 1 drop to eye 2 (two) times daily.     Cholecalciferol 50 MCG (2000 UT) TABS Take 1 tablet (2,000 Units total) by mouth daily. 90 tablet 1   cyanocobalamin (VITAMIN B12) 1000 MCG/ML injection INJECT 1 ML INTO THE MUSCLE ONCE EVERY MONTH 3 mL 0   Dulaglutide (TRULICITY) 1.5 MG/0.5ML SOPN Inject 1.5 mg as  directed once a week.     famotidine (PEPCID) 20 MG tablet Take 2 tablets by mouth once daily (Patient taking differently: Take 20 mg by mouth daily.) 180 tablet 0   hydrALAZINE (APRESOLINE) 25 MG tablet TAKE 1 TABLET BY MOUTH THREE TIMES DAILY 270 tablet 3   levothyroxine (SYNTHROID) 88 MCG tablet Take 88 mcg by mouth daily before breakfast.     losartan (COZAAR) 25 MG tablet Take 1 tablet (25 mg total) by mouth daily. 90 tablet 3   Magnesium Glycinate 100 MG CAPS Take by mouth.     magnesium hydroxide (DULCOLAX) 400 MG/5ML suspension Take 15 mLs by mouth daily as needed for mild  constipation.     mirtazapine (REMERON) 15 MG tablet Take 1 tablet (15 mg total) by mouth at bedtime. 90 tablet 1   Omega-3 Fatty Acids (FISH OIL PO) Take 1,400 mg by mouth daily.     Polyethyl Glycol-Propyl Glycol (SYSTANE) 0.4-0.3 % SOLN Place 1 drop into both eyes daily as needed (dry eyes).     rosuvastatin (CRESTOR) 10 MG tablet TAKE 1 TABLET BY MOUTH EVERY  MORNING 90 tablet 0   spironolactone (ALDACTONE) 25 MG tablet Take 0.5 tablets (12.5 mg total) by mouth daily. 45 tablet 3   No current facility-administered medications for this encounter.    Allergies  Allergen Reactions   Lisinopril Other (See Comments) and Cough    Cough and throat clearing   Metoprolol Succinate Other (See Comments)    Unknown reaction   Penicillins Swelling    Has patient had a PCN reaction causing immediate rash, facial/tongue/throat swelling, SOB or lightheadedness with hypotension: Yes Has patient had a PCN reaction causing severe rash involving mucus membranes or skin necrosis: No Has patient had a PCN reaction that required hospitalization: No Has patient had a PCN reaction occurring within the last 10 years: No If all of the above answers are "NO", then may proceed with Cephalosporin use.    Sulfamethoxazole Other (See Comments)    Unknown reaction   Sulfamethoxazole-Trimethoprim Other (See Comments)    Unknown reaction   Trimethoprim Other (See Comments)    Unknown reaction   Metformin Nausea Only      Social History   Socioeconomic History   Marital status: Married    Spouse name: Alanee Disabato   Number of children: 0   Years of education: masters   Highest education level: Not on file  Occupational History   Occupation: record Chief Executive Officer: [employer]  Tobacco Use   Smoking status: Never   Smokeless tobacco: Never  Vaping Use   Vaping status: Never Used  Substance and Sexual Activity   Alcohol use: Yes    Alcohol/week: 0.0 standard drinks of alcohol    Comment:  occasional-wine    Drug use: No   Sexual activity: Not Currently  Other Topics Concern   Not on file  Social History Narrative   Lives with husband in a 2 story home with a basement.  Has no children.     Retired from the police department.     Education: masters (guidance and counseling).    Social Determinants of Health   Financial Resource Strain: Low Risk  (02/26/2023)   Overall Financial Resource Strain (CARDIA)    Difficulty of Paying Living Expenses: Not hard at all  Food Insecurity: No Food Insecurity (02/26/2023)   Hunger Vital Sign    Worried About Running Out of Food in the Last Year: Never true  Ran Out of Food in the Last Year: Never true  Transportation Needs: No Transportation Needs (02/26/2023)   PRAPARE - Administrator, Civil Service (Medical): No    Lack of Transportation (Non-Medical): No  Physical Activity: Sufficiently Active (02/26/2023)   Exercise Vital Sign    Days of Exercise per Week: 3 days    Minutes of Exercise per Session: 60 min  Stress: No Stress Concern Present (02/26/2023)   [employer] of Occupational Health - Occupational Stress Questionnaire    Feeling of Stress : Not at all  Social Connections: Socially Integrated (02/26/2023)   Social Connection and Isolation Panel [NHANES]    Frequency of Communication with Friends and Family: More than three times a week    Frequency of Social Gatherings with Friends and Family: More than three times a week    Attends Religious Services: More than 4 times per year    Active Member of [employer] or Organizations: Yes    Attends Engineer, structural: More than 4 times per year    Marital Status: Married  Catering manager Violence: Not At Risk (02/26/2023)   Humiliation, Afraid, Rape, and Kick questionnaire    Fear of Current or Ex-Partner: No    Emotionally Abused: No    Physically Abused: No    Sexually Abused: No      Family History  Problem Relation Age of Onset    Prostate cancer Father    Diabetes Mother        retinopathy/blind   Deep vein thrombosis Mother    Hypertension Mother    Healthy Sister    Colon cancer Neg Hx    Esophageal cancer Neg Hx    Stomach cancer Neg Hx    Rectal cancer Neg Hx     Vitals:   03/13/23 1419  BP: (!) 142/86  Pulse: 98  SpO2: 99%  Weight: 73.1 kg (161 lb 3.2 oz)    PHYSICAL EXAM: General:  Elderly. Frail appearing. No respiratory difficulty HEENT: normal Neck: supple. no JVD. Carotids 2+ bilat; no bruits. No lymphadenopathy or thryomegaly appreciated. Cor: PMI nondisplaced. Regular tachy No rubs, gallops or murmurs. Lungs: clear Abdomen: soft, nontender, nondistended. No hepatosplenomegaly. No bruits or masses. Good bowel sounds. Extremities: no cyanosis, clubbing, rash, edema Neuro: alert & oriented x 3, cranial nerves grossly intact. moves all 4 extremities w/o difficulty. Affect pleasant.  ECG: 02/16/23 Sinus tach 100 1PAC normal QRS = No St-T abnormalities low volts. Personally reviewed   ASSESSMENT & PLAN:  1. Chronic systolic HF - Echo 1/22 EF 59% - Lexiscan  2/22: EF 42% normal perfusion - Echo 3/23 EF 45-50% - Echo 9/24 EF 20-25%  - She denies significant HF symptoms but clinically I suspect she is at least NYHA II particularly given her tachycardia - Volume status OK - Etiology of progressive cardiomyopathy unclear. With MGUS concern for potential infiltrative cardiomyopathy but protracted time course in confounding - Continue spironolactone 12.5 mg daily  - Continue losartan 25 daily. Need to try to get back on Entresto at some point - Continue hydral/imdur - Hold of on b-blocker now given tachycardia and risk for provoking worse HF - Add Jardiance 10  - Stressed need for cMRI to evaluate. Will attempt to move up cMRI from January.  - will see back soon to continue to titrate meds  2.  Hypertension - Meds as above  I spent a total > 55  minutes today: 1) reviewing the patient's  medical records  including previous charts, labs and recent notes from other providers; 2) examining the patient and counseling them on their medical issues/explaining the plan of care; 3) adjusting meds as needed and 4) ordering lab work or other needed tests.   Arvilla Meres, MD  2:50 PM

## 2023-03-13 NOTE — Patient Instructions (Addendum)
START Jadiance 10 mg daily.  Your physician recommends that you schedule a follow-up appointment in: 2 months (January 2025) ** PLEASE CALL THE OFFICE IN MID NOVEMBER TO ARRANGE YOUR FOLLOW UP APPOINTMENT. **  If you have any questions or concerns before your next appointment please send [name] a message through [place] or call our office at [phone].    TO LEAVE A MESSAGE FOR THE NURSE SELECT OPTION 2, PLEASE LEAVE A MESSAGE INCLUDING: YOUR NAME DATE OF BIRTH CALL BACK NUMBER REASON FOR CALL**this is important as we prioritize the call backs  YOU WILL RECEIVE A CALL BACK THE SAME DAY AS LONG AS YOU CALL BEFORE 4:00 PM  At the [facility], you and your health needs are our priority. As part of our continuing mission to provide you with exceptional heart care, we have created designated Provider Care Teams. These Care Teams include your primary Cardiologist (physician) and Advanced Practice Providers (APPs- Physician Assistants and Nurse Practitioners) who all work together to provide you with the care you need, when you need it.   You may see any of the following providers on your designated Care Team at your next follow up: Dr Arvilla Meres Dr Marca Ancona Dr. Dorthula Nettles Dr. Clearnce Hasten Amy Filbert Schilder, NP Robbie Lis, [place] [facility] [place], [place] Brynda Peon, NP [place] Lee, NP Karle Plumber, PharmD   Please be sure to bring in all your medications bottles to every appointment.    Thank you for choosing [facility]

## 2023-03-22 ENCOUNTER — Other Ambulatory Visit: Payer: Self-pay | Admitting: Internal Medicine

## 2023-03-22 DIAGNOSIS — E538 Deficiency of other specified B group vitamins: Secondary | ICD-10-CM

## 2023-04-09 ENCOUNTER — Telehealth: Payer: Self-pay | Admitting: Physician Assistant

## 2023-04-09 ENCOUNTER — Telehealth: Payer: Self-pay

## 2023-04-09 ENCOUNTER — Telehealth: Payer: Self-pay | Admitting: Internal Medicine

## 2023-04-09 NOTE — Telephone Encounter (Signed)
Patient states she does not wish to have anesthesia. She will do MRI but without anesthesia

## 2023-04-09 NOTE — Telephone Encounter (Signed)
Patient is calling wanting to know why her MRI is scheduled to be with anesthesia. She states if it is not required she would prefer not to use it.   Please advise

## 2023-04-09 NOTE — Telephone Encounter (Signed)
Patient wants to know if we have a samples pens of trulicity 1.5 - please call patient and let her know  [phone]

## 2023-04-09 NOTE — Telephone Encounter (Signed)
Advised patient we do not have any samples of Trulicity in office she gave a verbal understanding.

## 2023-04-09 NOTE — Telephone Encounter (Signed)
LVM explaining to pt that Dr. Odis Hollingshead had requested to cancel pt's cardiac MRI d/t the pt needing general anesthesia for the scan and that not being an option d/t the instructions the pt has to follow during the scan. Order has been canceled.   ----- Message -----  From: Tessa Lerner, DO  Sent: 04/08/2023   3:27 PM EST  To: Caroleen Hamman, NT; Elliot Cousin, RMA; *  Subject: RE: MRI with sedation                          Please cancel the cMRI as general anesthesia is not an option.  For cardiac MRI she has to do breath hold and all.   Sunit Malvern, DO, [facility]

## 2023-04-10 ENCOUNTER — Other Ambulatory Visit: Payer: Self-pay

## 2023-04-10 ENCOUNTER — Telehealth: Payer: Self-pay

## 2023-04-10 DIAGNOSIS — I5020 Unspecified systolic (congestive) heart failure: Secondary | ICD-10-CM

## 2023-04-10 NOTE — Telephone Encounter (Signed)
-----   Message from Sharlene Dory sent at 04/10/2023  1:23 PM EST ----- Not from my end. Will add Dr. Odis Hollingshead as well.   Thanks,  Sharlene Dory, PA-C ----- Message ----- From: Cherylann Banas, RN Sent: 04/09/2023   3:58 PM EST To: Sharlene Dory, PA-C  Hey! Was there a specific reason for why this pt order for a cardiac MRI was with general anesthesia?

## 2023-04-10 NOTE — Telephone Encounter (Signed)
Spoke with pt and explained the reasoning behind canceling the original cardiac MRI (it was ordered with general anesthesia and that is not compatible with being able to follow directions in the MRI). Pt understood and agreed to reordering a cardiac MRI without general anesthesia. Order is placed and a message has been sent to scheduling pool.

## 2023-04-24 ENCOUNTER — Encounter (HOSPITAL_COMMUNITY): Payer: Self-pay

## 2023-04-25 ENCOUNTER — Telehealth (HOSPITAL_COMMUNITY): Payer: Self-pay | Admitting: *Deleted

## 2023-04-25 ENCOUNTER — Telehealth: Payer: Self-pay | Admitting: Cardiology

## 2023-04-25 NOTE — Telephone Encounter (Signed)
Spoke with the patient and advised her not to take spironolactone tomorrow before her MRI. She may take all other medications with a sip of water. Patient verbalized understanding.

## 2023-04-25 NOTE — Telephone Encounter (Signed)
Reaching out to patient to offer assistance regarding upcoming cardiac imaging study; pt verbalizes understanding of appt date/time, parking situation and where to check in, and verified current allergies; name and call back number provided for further questions should they arise  Larey Brick RN Navigator Cardiac Imaging [facility] [phone] office [phone] cell  Patient denies metal but does report claustrophobia.

## 2023-04-25 NOTE — Telephone Encounter (Signed)
Patient calling with question concerning her medication on tomorrow. Please advise

## 2023-04-26 ENCOUNTER — Other Ambulatory Visit: Payer: Self-pay | Admitting: Cardiology

## 2023-04-26 ENCOUNTER — Ambulatory Visit (HOSPITAL_COMMUNITY): Admit: 2023-04-26 | Payer: Medicare Other

## 2023-04-26 ENCOUNTER — Ambulatory Visit (HOSPITAL_COMMUNITY): Payer: Medicare Other

## 2023-04-26 ENCOUNTER — Ambulatory Visit (HOSPITAL_COMMUNITY)
Admission: RE | Admit: 2023-04-26 | Discharge: 2023-04-26 | Disposition: A | Discharge status: Home / Self Care | Payer: Medicare Other | Source: Ambulatory Visit | Attending: Cardiology | Admitting: Cardiology

## 2023-04-26 DIAGNOSIS — I5020 Unspecified systolic (congestive) heart failure: Secondary | ICD-10-CM | POA: Insufficient documentation

## 2023-04-26 SURGERY — MRI WITH ANESTHESIA
Anesthesia: General

## 2023-04-26 MED ORDER — GADOBUTROL 1 MMOL/ML IV SOLN
7.0000 mL | Freq: Once | INTRAVENOUS | Status: AC | PRN
  Administered 2023-04-26: 7 mL via INTRAVENOUS

## 2023-04-26 MED ORDER — GADOBUTROL 1 MMOL/ML IV SOLN: 7.0000 mL | Freq: Once | INTRAVENOUS | Status: DC | PRN

## 2023-05-01 ENCOUNTER — Telehealth (HOSPITAL_COMMUNITY): Payer: Self-pay | Admitting: Pharmacy Technician

## 2023-05-01 NOTE — Telephone Encounter (Signed)
Advanced Heart Failure Patient Advocate Encounter  The patient was approved for a [employer] grant that will help cover the cost of Jardiance, Spironolactone and Losartan. Total amount awarded, $10,000. Eligibility, 04/01/23 - 03/30/24.  ID [id number]  BIN [id number]  PCN [id number]  Group [id number]  Called and spoke with the patient. Provided billing information to pharmacy.   Archer Asa, CPhT

## 2023-05-03 ENCOUNTER — Telehealth: Payer: Self-pay

## 2023-05-03 ENCOUNTER — Other Ambulatory Visit: Payer: Self-pay

## 2023-05-03 ENCOUNTER — Other Ambulatory Visit (HOSPITAL_COMMUNITY): Payer: Self-pay

## 2023-05-03 ENCOUNTER — Telehealth: Payer: Self-pay | Admitting: Cardiology

## 2023-05-03 MED ORDER — EMPAGLIFLOZIN 10 MG PO TABS: 10.0000 mg | ORAL_TABLET | Freq: Every day | ORAL | 11 refills | Status: DC

## 2023-05-03 NOTE — Telephone Encounter (Signed)
    Patient called re [facility] and grant. Notes indicate both sent to [facility] pharmacy today but patient called tonight advising that pharmacy did not have grant information. Rx still showing full price for patient. Please re-send grant information.  Perlie Gold, PA-C

## 2023-05-03 NOTE — Telephone Encounter (Signed)
Spoke with pt and advised her that she is eligible for a [employer] grant to help with the price of Jardiance. Explained to pt a refill for her medication has been sent to her preferred [facility] pharmacy and that the grant information has already been faxed to the pharmacy per Haze Rushing, [employer]. Pt verbalized understanding and has no further questions at this time.

## 2023-05-04 ENCOUNTER — Telehealth: Payer: Self-pay

## 2023-05-04 NOTE — Telephone Encounter (Signed)
[facility] is bad about throwing away important faxes. I think Katie [name] had faxed it to them also. Just refaxed grant info to [employer]. If anything we can provide it to the patient so she can present the card at pickup.

## 2023-05-04 NOTE — Telephone Encounter (Signed)
Spoke with pt regarding her [employer]well grant information. Explained that per Haze Rushing, [employer], the grant information was resent to [facility]. A MyChart message was sent to the pt with the grant information in case [facility] still had an issue however, pt doesn't currently have access to MyChart. Pt told a copy of the MyChart message would be left at the front desk of our office for her to pick-up so she would have the grant information to present to the [facility] pharmacy if needed. Pt verbalized understanding and stated she would stop by today to get the paper.

## 2023-05-04 NOTE — Telephone Encounter (Signed)
Sent grant info to patient via Clinical cytogeneticist after refaxing info to [employer].

## 2023-05-18 ENCOUNTER — Other Ambulatory Visit: Payer: Self-pay | Admitting: Internal Medicine

## 2023-05-18 DIAGNOSIS — K2100 Gastro-esophageal reflux disease with esophagitis, without bleeding: Secondary | ICD-10-CM

## 2023-05-19 ENCOUNTER — Other Ambulatory Visit: Payer: Self-pay | Admitting: Internal Medicine

## 2023-05-19 DIAGNOSIS — E785 Hyperlipidemia, unspecified: Secondary | ICD-10-CM

## 2023-05-22 ENCOUNTER — Telehealth: Payer: Self-pay | Admitting: Internal Medicine

## 2023-05-22 NOTE — Telephone Encounter (Signed)
 Copied from CRM [id number]. Topic: Clinical - Medication Question >> May 22, 2023 11:03 AM Burnard DEL wrote: Reason for CRM: patient would like to know if she is still suppose to be taking Trulicity.Per her medication list it looks like this medication is a historical medication.Please advise

## 2023-05-22 NOTE — Telephone Encounter (Signed)
**Note De-identified  [place] Obfuscation** Please advise 

## 2023-05-22 NOTE — Telephone Encounter (Signed)
 Copied from CRM [id number]. Topic: Referral - Question >> May 22, 2023 10:55 AM Burnard DEL wrote: Reason for CRM: patient called in stating she would like a to see a new endocrinologist named Dr Tawni Fendt. She tried to set up an appointment with them but she think that she is needing a referral sent over to them. [address] [place] [address] is her address. She also states that she is needing to see a counselor for her anxiety and depression after the lost of her husband and sister.She mentioned someone named bianca Ashe that is with [facility] that she would like to see. She is needing a referral to see her as well.

## 2023-05-22 NOTE — Telephone Encounter (Signed)
She's going to need an appointment for this.

## 2023-05-23 ENCOUNTER — Telehealth: Payer: Self-pay | Admitting: Internal Medicine

## 2023-05-23 NOTE — Telephone Encounter (Signed)
 PATIENT IS CALLING IN TO SEE ABOUT A REFERRAL TO    DR Ernest Haber

## 2023-05-23 NOTE — Telephone Encounter (Signed)
 Patient has been scheduled on 1/20 at 2:20 so that we can place this referral.

## 2023-05-25 ENCOUNTER — Ambulatory Visit (HOSPITAL_COMMUNITY)
Admission: RE | Admit: 2023-05-25 | Discharge: 2023-05-25 | Disposition: A | Discharge status: Home / Self Care | Payer: Medicare Other | Source: Ambulatory Visit | Attending: Internal Medicine | Admitting: Internal Medicine

## 2023-05-25 ENCOUNTER — Encounter (HOSPITAL_COMMUNITY): Payer: Self-pay | Admitting: Internal Medicine

## 2023-05-25 VITALS — BP 118/78 | HR 91 | Wt 158.8 lb

## 2023-05-25 DIAGNOSIS — Z79899 Other long term (current) drug therapy: Secondary | ICD-10-CM | POA: Insufficient documentation

## 2023-05-25 DIAGNOSIS — I429 Cardiomyopathy, unspecified: Secondary | ICD-10-CM | POA: Diagnosis not present

## 2023-05-25 DIAGNOSIS — E039 Hypothyroidism, unspecified: Secondary | ICD-10-CM | POA: Diagnosis not present

## 2023-05-25 DIAGNOSIS — N189 Chronic kidney disease, unspecified: Secondary | ICD-10-CM | POA: Insufficient documentation

## 2023-05-25 DIAGNOSIS — E1122 Type 2 diabetes mellitus with diabetic chronic kidney disease: Secondary | ICD-10-CM | POA: Insufficient documentation

## 2023-05-25 DIAGNOSIS — Z043 Encounter for examination and observation following other accident: Secondary | ICD-10-CM | POA: Diagnosis not present

## 2023-05-25 DIAGNOSIS — I10 Essential (primary) hypertension: Secondary | ICD-10-CM

## 2023-05-25 DIAGNOSIS — K219 Gastro-esophageal reflux disease without esophagitis: Secondary | ICD-10-CM | POA: Insufficient documentation

## 2023-05-25 DIAGNOSIS — I130 Hypertensive heart and chronic kidney disease with heart failure and stage 1 through stage 4 chronic kidney disease, or unspecified chronic kidney disease: Secondary | ICD-10-CM | POA: Insufficient documentation

## 2023-05-25 DIAGNOSIS — D472 Monoclonal gammopathy: Secondary | ICD-10-CM | POA: Diagnosis not present

## 2023-05-25 DIAGNOSIS — Z86718 Personal history of other venous thrombosis and embolism: Secondary | ICD-10-CM | POA: Diagnosis not present

## 2023-05-25 DIAGNOSIS — I5022 Chronic systolic (congestive) heart failure: Secondary | ICD-10-CM | POA: Insufficient documentation

## 2023-05-25 MED ORDER — CARVEDILOL 3.125 MG PO TABS: 3.1250 mg | ORAL_TABLET | Freq: Two times a day (BID) | ORAL | 5 refills | Status: DC

## 2023-05-25 NOTE — Addendum Note (Signed)
 Encounter addended by: Dolores Patty, MD on: 05/25/2023 11:39 AM  Actions taken: Clinical Note Signed

## 2023-05-25 NOTE — Progress Notes (Addendum)
 [facility] NOTE  Referring Physician: Joshua Debby CROME, MD Primary Care: Katie Debby CROME, MD Primary Cardiologist: Dr. Michele  HPI:  Katie [name] is a 78 y.o. female with a past medical history of cardiomyopathy, history of DVT, GERD, hypertension, chronic kidney disease, hypothyroidism, hyperlipidemia, prediabetes and MGUS.  She has been followed by Dr. Michele for progressively falling EF. She has been resistant to a formal w/u. Referred by Dr. Michele for HF evaluation.    Echo 1/22 EF 59% Lexiscan  2/22: EF 42% normal perfusion Echo 3/23 EF 45-50% Echo 9/24 EF 20-25%      Bone marrow biopsy demonstrated small clone of plasma cells measuring about 3 to 5%, normal FISH testing, normal cytogenetics. 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  I saw her in 10/24 and ordered cMRI 12/24 1. Normal LV size, mild hypertrophy, and moderate systolic dysfunction (EF 39%) 2.  Normal RV size with mild systolic dysfunction (EF 45%) 3. RV insertion site LGE, which is a nonspecific scar pattern often seen in setting of elevated pulmonary pressures  Here for routine f/u. Remains under a lot of stress. Last year was a tough year for her. Lost her husband (died in 10/29/2023 from esophageal CA) and sister (died this month from pancreatic CA) Still struggling with grief. Otherwise feels good. Active. Denies SOB, edema, orthopnea or PND. Tolerating meds well.    Past Medical History:  Diagnosis Date   Acute venous embolism and thrombosis of unspecified deep vessels of lower extremity 2001   RLE DVT and PE, coumadin x 27mo, now ASSA   Allergy    Anxiety state, unspecified    Blood transfusion without reported diagnosis    Cataract    Chronic idiopathic constipation 11/02/2016   Clotting disorder (HCC)    Degeneration of cervical intervertebral disc    Gallstones    History of DVT (deep vein thrombosis)    Iron deficiency anemia secondary to blood loss (chronic)    Irritable bowel syndrome    Lumbago     Other B-complex deficiencies    Personal history of colonic polyps    adenomatous   Pure hypercholesterolemia    Reflux esophagitis    Tubular adenoma of colon 09/2002   Type II or unspecified type diabetes mellitus without mention of complication, not stated as uncontrolled    Unspecified essential hypertension    Unspecified hypothyroidism    Unspecified iridocyclitis    Unspecified venous (peripheral) insufficiency    Unspecified vitamin D  deficiency     Current Outpatient Medications  Medication Sig Dispense Refill   ALPRAZolam  (XANAX ) 0.5 MG tablet TAKE 1 TABLET BY MOUTH 3 TIMES  DAILY AS NEEDED FOR ANXIETY OR  SLEEP 270 tablet 0   aspirin  81 MG chewable tablet Chew 81 mg by mouth every morning.     Cholecalciferol  50 MCG (2000 UT) TABS Take 1 tablet (2,000 Units total) by mouth daily. 90 tablet 1   cyanocobalamin  (VITAMIN B12) 1000 MCG/ML injection INJECT 1 ML INTO THE MUSCLE EVERY MONTH 3 mL 0   empagliflozin  (JARDIANCE ) 10 MG TABS tablet Take 1 tablet (10 mg total) by mouth daily before breakfast. 30 tablet 11   famotidine  (PEPCID ) 20 MG tablet Take 20 mg by mouth daily.     hydrALAZINE  (APRESOLINE ) 25 MG tablet TAKE 1 TABLET BY MOUTH THREE TIMES DAILY 270 tablet 3   levothyroxine  (SYNTHROID ) 88 MCG tablet Take 88 mcg by mouth daily before breakfast.     losartan  (COZAAR ) 25 MG  tablet Take 1 tablet (25 mg total) by mouth daily. 90 tablet 3   magnesium hydroxide (DULCOLAX) 400 MG/5ML suspension Take 15 mLs by mouth daily as needed for mild constipation.     mirtazapine  (REMERON  SOL-TAB) 15 MG disintegrating tablet Take 15 mg by mouth at bedtime as needed.     Omega-3 Fatty Acids (FISH OIL PO) Take 1,400 mg by mouth daily.     Polyethyl Glycol-Propyl Glycol (SYSTANE) 0.4-0.3 % SOLN Place 1 drop into both eyes daily as needed (dry eyes).     rosuvastatin  (CRESTOR ) 10 MG tablet TAKE 1 TABLET BY MOUTH IN THE  MORNING 90 tablet 0   spironolactone  (ALDACTONE ) 25 MG tablet Take 0.5  tablets (12.5 mg total) by mouth daily. 45 tablet 3   Dulaglutide (TRULICITY) 1.5 MG/0.5ML SOPN Inject 1.5 mg as directed once a week. (Patient not taking: Reported on 05/25/2023)     No current facility-administered medications for this encounter.    Allergies  Allergen Reactions   Lisinopril  Other (See Comments) and Cough    Cough and throat clearing   Metoprolol Succinate Other (See Comments)    Unknown reaction   Penicillins Swelling    Has patient had a PCN reaction causing immediate rash, facial/tongue/throat swelling, SOB or lightheadedness with hypotension: Yes Has patient had a PCN reaction causing severe rash involving mucus membranes or skin necrosis: No Has patient had a PCN reaction that required hospitalization: No Has patient had a PCN reaction occurring within the last 10 years: No If all of the above answers are NO, then may proceed with Cephalosporin use.    Sulfamethoxazole Other (See Comments)    Unknown reaction   Sulfamethoxazole-Trimethoprim Other (See Comments)    Unknown reaction   Trimethoprim Other (See Comments)    Unknown reaction   Metformin Nausea Only      Social History   Socioeconomic History   Marital status: Married    Spouse name: Katie [name]   Number of children: 0   Years of education: masters   Highest education level: Not on file  Occupational History   Occupation: record Chief Executive Officer: [employer]  Tobacco Use   Smoking status: Never   Smokeless tobacco: Never  Vaping Use   Vaping status: Never Used  Substance and Sexual Activity   Alcohol use: Yes    Alcohol/week: 0.0 standard drinks of alcohol    Comment: occasional-wine    Drug use: No   Sexual activity: Not Currently  Other Topics Concern   Not on file  Social History Narrative   Lives with husband in a 2 story home with a basement.  Has no children.     Retired from the police department.     Education: masters (guidance and counseling).    Social  Drivers of Corporate Investment Banker Strain: Low Risk  (02/26/2023)   Overall Financial Resource Strain (CARDIA)    Difficulty of Paying Living Expenses: Not hard at all  Food Insecurity: Low Risk  (04/05/2023)   Received from [facility]   Hunger Vital Sign    Worried About Running Out of Food in the Last Year: Never true    Ran Out of Food in the Last Year: Never true  Transportation Needs: No Transportation Needs (04/05/2023)   Received from [facility]    In the past 12 months, has lack of reliable transportation kept you from medical appointments, meetings, work or from getting things needed for  daily living? : No  Physical Activity: Sufficiently Active (02/26/2023)   Exercise Vital Sign    Days of Exercise per Week: 3 days    Minutes of Exercise per Session: 60 min  Stress: No Stress Concern Present (02/26/2023)   [employer] of Occupational Health - Occupational Stress Questionnaire    Feeling of Stress : Not at all  Social Connections: Socially Integrated (02/26/2023)   Social Connection and Isolation Panel [NHANES]    Frequency of Communication with Friends and Family: More than three times a week    Frequency of Social Gatherings with Friends and Family: More than three times a week    Attends Religious Services: More than 4 times per year    Active Member of [employer] or Organizations: Yes    Attends Engineer, Structural: More than 4 times per year    Marital Status: Married  Catering Manager Violence: Not At Risk (02/26/2023)   Humiliation, Afraid, Rape, and Kick questionnaire    Fear of Current or Ex-Partner: No    Emotionally Abused: No    Physically Abused: No    Sexually Abused: No      Family History  Problem Relation Age of Onset   Prostate cancer Father    Diabetes Mother        retinopathy/blind   Deep vein thrombosis Mother    Hypertension Mother    Healthy Sister    Colon cancer Neg Hx    Esophageal cancer Neg Hx     Stomach cancer Neg Hx    Rectal cancer Neg Hx     Vitals:   05/25/23 1054  BP: 118/78  Pulse: 91  SpO2: 99%  Weight: 72 kg (158 lb 12.8 oz)    PHYSICAL EXAM: General:  Well appearing. No resp difficulty HEENT: normal Neck: supple. no JVD. Carotids 2+ bilat; no bruits. No lymphadenopathy or thryomegaly appreciated. Cor: PMI nondisplaced. Regular rate & rhythm. No rubs, gallops or murmurs. Lungs: clear Abdomen: soft, nontender, nondistended. No hepatosplenomegaly. No bruits or masses. Good bowel sounds. Extremities: no cyanosis, clubbing, rash, edema Neuro: alert & orientedx3, cranial nerves grossly intact. moves all 4 extremities w/o difficulty. Affect pleasant  ASSESSMENT & PLAN:  1. Chronic systolic HF - Echo 1/22 EF 59% - Lexiscan  2/22: EF 42% normal perfusion - Echo 3/23 EF 45-50% - Echo 9/24 EF 20-25%  - CMRI 12/24 EF 39% RV insertion site LGE. Otherwise ok  - Doing well. NYHA I - Volume status OK - Continue Jardiance  10 - Continue spironolactone  12.5 mg daily  - Continue losartan  25 daily. - Add carvedilol  3.125 bid (reported potential rash Toprol)  - Continue hydral (off Imdur) - cMRI reassuring. EF improving. No evidence of infiltrative process. Suspect DCM. Will continue meidcal therapy. Repeat echo 6 months.   2.  Hypertension - Blood pressure well controlled. Meds as above  3. MGUS - followed bu Hematology/Oncology - no evidence of cardiac amyloid on cMRI  Toribio Fuel, MD  11:07 AM

## 2023-05-25 NOTE — Addendum Note (Signed)
 Encounter addended by: Baird Cancer, RN on: 05/25/2023 11:42 AM  Actions taken: Clinical Note Signed, Order list changed

## 2023-05-25 NOTE — Patient Instructions (Signed)
 Medication Changes:  START: CARVEDILOL  3.125MG  TWICE DAILY   Follow-Up in: 6 MONTHS WITH ECHO PLEASE CALL OUR OFFICE AROUND MAY 2025 TO GET SCHEDULED FOR YOUR APPOINTMENT. PHONE NUMBER IS [phone] OPTION 2    At the [facility], you and your health needs are our priority. We have a designated team specialized in the treatment of Heart Failure. This Care Team includes your primary Heart Failure Specialized Cardiologist (physician), Advanced Practice Providers (APPs- Physician Assistants and Nurse Practitioners), and Pharmacist who all work together to provide you with the care you need, when you need it.   You may see any of the following providers on your designated Care Team at your next follow up:  Dr. Toribio Fuel Dr. Ezra Shuck Dr. Ria Commander Dr. Odis Brownie Greig Mosses, NP Caffie Shed, [place] [facility] [place], [place] Beckey Coe, NP Jordan Lee, NP Tinnie Redman, PharmD   Please be sure to bring in all your medications bottles to every appointment.   Need to Contact Us :  If you have any questions or concerns before your next appointment please send us  a message through [facility] or call our office at [phone].    TO LEAVE A MESSAGE FOR THE NURSE SELECT OPTION 2, PLEASE LEAVE A MESSAGE INCLUDING: YOUR NAME DATE OF BIRTH CALL BACK NUMBER REASON FOR CALL**this is important as we prioritize the call backs  YOU WILL RECEIVE A CALL BACK THE SAME DAY AS LONG AS YOU CALL BEFORE 4:00 PM

## 2023-05-28 ENCOUNTER — Other Ambulatory Visit: Payer: Self-pay | Admitting: Internal Medicine

## 2023-05-28 DIAGNOSIS — K2100 Gastro-esophageal reflux disease with esophagitis, without bleeding: Secondary | ICD-10-CM

## 2023-05-29 ENCOUNTER — Inpatient Hospital Stay: Payer: Medicare Other | Admitting: Hematology and Oncology

## 2023-06-04 ENCOUNTER — Ambulatory Visit: Payer: Medicare Other | Attending: Cardiology | Admitting: Cardiology

## 2023-06-04 ENCOUNTER — Encounter: Payer: Self-pay | Admitting: Cardiology

## 2023-06-04 ENCOUNTER — Ambulatory Visit (INDEPENDENT_AMBULATORY_CARE_PROVIDER_SITE_OTHER): Payer: Medicare Other | Admitting: Internal Medicine

## 2023-06-04 ENCOUNTER — Telehealth (HOSPITAL_COMMUNITY): Payer: Self-pay | Admitting: Pharmacy Technician

## 2023-06-04 ENCOUNTER — Encounter: Payer: Self-pay | Admitting: Internal Medicine

## 2023-06-04 VITALS — BP 120/82 | HR 79 | Temp 98.5°F | Ht 65.0 in | Wt 158.0 lb

## 2023-06-04 VITALS — BP 112/70 | HR 83 | Resp 16 | Ht 65.0 in | Wt 157.8 lb

## 2023-06-04 DIAGNOSIS — R270 Ataxia, unspecified: Secondary | ICD-10-CM

## 2023-06-04 DIAGNOSIS — I10 Essential (primary) hypertension: Secondary | ICD-10-CM

## 2023-06-04 DIAGNOSIS — D472 Monoclonal gammopathy: Secondary | ICD-10-CM | POA: Diagnosis not present

## 2023-06-04 DIAGNOSIS — I340 Nonrheumatic mitral (valve) insufficiency: Secondary | ICD-10-CM | POA: Diagnosis not present

## 2023-06-04 DIAGNOSIS — I429 Cardiomyopathy, unspecified: Secondary | ICD-10-CM

## 2023-06-04 DIAGNOSIS — E538 Deficiency of other specified B group vitamins: Secondary | ICD-10-CM | POA: Diagnosis not present

## 2023-06-04 DIAGNOSIS — I5042 Chronic combined systolic (congestive) and diastolic (congestive) heart failure: Secondary | ICD-10-CM

## 2023-06-04 DIAGNOSIS — N1832 Chronic kidney disease, stage 3b: Secondary | ICD-10-CM

## 2023-06-04 DIAGNOSIS — F411 Generalized anxiety disorder: Secondary | ICD-10-CM

## 2023-06-04 DIAGNOSIS — E039 Hypothyroidism, unspecified: Secondary | ICD-10-CM

## 2023-06-04 DIAGNOSIS — E785 Hyperlipidemia, unspecified: Secondary | ICD-10-CM | POA: Diagnosis not present

## 2023-06-04 DIAGNOSIS — R7303 Prediabetes: Secondary | ICD-10-CM | POA: Diagnosis not present

## 2023-06-04 LAB — CBC WITH DIFFERENTIAL/PLATELET
Basophils Absolute: 0 10*3/uL (ref 0.0–0.1)
Basophils Relative: 0.8 % (ref 0.0–3.0)
Eosinophils Absolute: 0.2 10*3/uL (ref 0.0–0.7)
Eosinophils Relative: 3.3 % (ref 0.0–5.0)
HCT: 42.3 % (ref 36.0–46.0)
Hemoglobin: 14.1 g/dL (ref 12.0–15.0)
Lymphocytes Relative: 29.2 % (ref 12.0–46.0)
Lymphs Abs: 1.4 10*3/uL (ref 0.7–4.0)
MCHC: 33.3 g/dL (ref 30.0–36.0)
MCV: 84.2 fL (ref 78.0–100.0)
Monocytes Absolute: 0.5 10*3/uL (ref 0.1–1.0)
Monocytes Relative: 9.8 % (ref 3.0–12.0)
Neutro Abs: 2.7 10*3/uL (ref 1.4–7.7)
Neutrophils Relative %: 56.9 % (ref 43.0–77.0)
Platelets: 215 10*3/uL (ref 150.0–400.0)
RBC: 5.03 Mil/uL (ref 3.87–5.11)
RDW: 14.9 % (ref 11.5–15.5)
WBC: 4.8 10*3/uL (ref 4.0–10.5)

## 2023-06-04 LAB — HEPATIC FUNCTION PANEL
ALT: 17 U/L (ref 0–35)
AST: 22 U/L (ref 0–37)
Albumin: 4.1 g/dL (ref 3.5–5.2)
Alkaline Phosphatase: 48 U/L (ref 39–117)
Bilirubin, Direct: 0.2 mg/dL (ref 0.0–0.3)
Total Bilirubin: 1.2 mg/dL (ref 0.2–1.2)
Total Protein: 7.3 g/dL (ref 6.0–8.3)

## 2023-06-04 LAB — BASIC METABOLIC PANEL
BUN: 19 mg/dL (ref 6–23)
CO2: 29 meq/L (ref 19–32)
Calcium: 10 mg/dL (ref 8.4–10.5)
Chloride: 105 meq/L (ref 96–112)
Creatinine, Ser: 1.28 mg/dL — ABNORMAL HIGH (ref 0.40–1.20)
GFR: 40.15 mL/min — ABNORMAL LOW (ref >60.00)
Glucose, Bld: 154 mg/dL — ABNORMAL HIGH (ref 70–99)
Potassium: 4.9 meq/L (ref 3.5–5.1)
Sodium: 139 meq/L (ref 135–145)

## 2023-06-04 LAB — MICROALBUMIN / CREATININE URINE RATIO
Creatinine,U: 89.5 mg/dL
Microalb Creat Ratio: 1.7 mg/g (ref 0.0–30.0)
Microalb, Ur: 1.6 mg/dL (ref 0.0–1.9)

## 2023-06-04 LAB — HEMOGLOBIN A1C: Hgb A1c MFr Bld: 5.6 % (ref 4.6–6.5)

## 2023-06-04 LAB — TSH: TSH: 2.27 u[IU]/mL (ref 0.35–5.50)

## 2023-06-04 MED ORDER — SPIRONOLACTONE 25 MG PO TABS: 25.0000 mg | ORAL_TABLET | Freq: Every morning | ORAL | 3 refills | Status: DC

## 2023-06-04 MED ORDER — LOSARTAN POTASSIUM 25 MG PO TABS: 25.0000 mg | ORAL_TABLET | Freq: Every evening | ORAL | Status: DC

## 2023-06-04 MED ORDER — EMPAGLIFLOZIN 10 MG PO TABS: 10.0000 mg | ORAL_TABLET | Freq: Every morning | ORAL | Status: DC

## 2023-06-04 MED ORDER — HYDRALAZINE HCL 25 MG PO TABS: 25.0000 mg | ORAL_TABLET | Freq: Two times a day (BID) | ORAL | 3 refills | Status: DC

## 2023-06-04 NOTE — Progress Notes (Unsigned)
Subjective:  Patient ID: Katie [name], female    DOB: 1945/09/12  Age: 78 y.o. MRN: [id number]  CC: Hypothyroidism   HPI Katie [name] presents for f/up -----  Discussed the use of AI scribe software for clinical note transcription with the patient, who gave verbal consent to proceed.  History of Present Illness   The patient, with a history of anxiety, diabetes, and degenerative disc disease, presents with ongoing anxiety symptoms. She describes a sensation of anxiety that originates in the stomach and interferes with her daily life. The anxiety is somewhat managed with occasional use of alprazolam, which she takes only when needed. She reports that her anxiety can cause her heart to race at times, but this symptom is also alleviated with alprazolam. The patient's anxiety is often triggered by stressful events or situations, such as a pipe bursting at home.  The patient also reports a loss of appetite following the death of her sister in 03/12/2024, which she attributes to stress. She has been prescribed Jardiance by her cardiologist, but expresses concern about the cost of the medication. She also expresses a desire to change her diabetes doctor to one that is closer to her location.  In addition to her anxiety and diabetes, the patient reports occasional imbalance, which she believes is related to her degenerative disc disease in the lower back. She has previously received an injection for this condition, which provided temporary relief. She expresses a desire to improve her overall health and is considering physical therapy or gym attendance to help manage her back pain and imbalance.  The patient is also on levothyroxine for a thyroid condition and famotidine for heartburn, although she reports not experiencing heartburn or indigestion. She has been referred to a behavioral specialist, Colleen Can, for further management of her anxiety.        Outpatient Medications Prior to Visit  Medication  Sig Dispense Refill   ALPRAZolam (XANAX) 0.5 MG tablet TAKE 1 TABLET BY MOUTH 3 TIMES  DAILY AS NEEDED FOR ANXIETY OR  SLEEP 270 tablet 0   aspirin 81 MG chewable tablet Chew 81 mg by mouth every morning.     carvedilol (COREG) 3.125 MG tablet Take 1 tablet (3.125 mg total) by mouth 2 (two) times daily. 60 tablet 5   Cholecalciferol 50 MCG (2000 UT) TABS Take 1 tablet (2,000 Units total) by mouth daily. 90 tablet 1   cyanocobalamin (VITAMIN B12) 1000 MCG/ML injection INJECT 1 ML INTO THE MUSCLE EVERY MONTH 3 mL 0   Dulaglutide (TRULICITY) 1.5 MG/0.5ML SOPN Inject 1.5 mg as directed once a week.     empagliflozin (JARDIANCE) 10 MG TABS tablet Take 1 tablet (10 mg total) by mouth in the morning.     famotidine (PEPCID) 20 MG tablet Take 2 tablets by mouth once daily 180 tablet 0   hydrALAZINE (APRESOLINE) 25 MG tablet Take 1 tablet (25 mg total) by mouth in the morning and at bedtime. 180 tablet 3   levothyroxine (SYNTHROID) 88 MCG tablet Take 88 mcg by mouth daily before breakfast.     losartan (COZAAR) 25 MG tablet Take 1 tablet (25 mg total) by mouth every evening.     magnesium hydroxide (DULCOLAX) 400 MG/5ML suspension Take 15 mLs by mouth daily as needed for mild constipation.     mirtazapine (REMERON SOL-TAB) 15 MG disintegrating tablet Take 15 mg by mouth at bedtime as needed.     Omega-3 Fatty Acids (FISH OIL PO) Take 1,400 mg by mouth daily.  Polyethyl Glycol-Propyl Glycol (SYSTANE) 0.4-0.3 % SOLN Place 1 drop into both eyes daily as needed (dry eyes).     rosuvastatin (CRESTOR) 10 MG tablet TAKE 1 TABLET BY MOUTH IN THE  MORNING 90 tablet 0   spironolactone (ALDACTONE) 25 MG tablet Take 1 tablet (25 mg total) by mouth in the morning. 90 tablet 3   No facility-administered medications prior to visit.    ROS Review of Systems  Constitutional: Negative.  Negative for chills, diaphoresis, fatigue and unexpected weight change.  HENT: Negative.    Respiratory: Negative.  Negative for  chest tightness, shortness of breath and wheezing.   Cardiovascular:  Negative for chest pain, palpitations and leg swelling.  Gastrointestinal:  Negative for abdominal pain, constipation, diarrhea, nausea and vomiting.  Genitourinary: Negative.  Negative for difficulty urinating and dyspareunia.  Musculoskeletal:  Positive for back pain and gait problem. Negative for arthralgias, myalgias and neck pain.  Skin: Negative.   Neurological:  Negative for dizziness, weakness, light-headedness and numbness.  Hematological:  Negative for adenopathy. Does not bruise/bleed easily.  Psychiatric/Behavioral:  Positive for confusion and decreased concentration. Negative for sleep disturbance. The patient is nervous/anxious.     Objective:  BP 120/82 (BP Location: Left Arm, Patient Position: Sitting, Cuff Size: Normal)   Pulse 79   Temp 98.5 F (36.9 C) (Oral)   Ht 5\' 5"  (1.651 m)   Wt 158 lb (71.7 kg)   SpO2 99%   BMI 26.29 kg/m   BP Readings from Last 3 Encounters:  06/04/23 120/82  06/04/23 112/70  05/25/23 118/78    Wt Readings from Last 3 Encounters:  06/04/23 158 lb (71.7 kg)  06/04/23 157 lb 12.8 oz (71.6 kg)  05/25/23 158 lb 12.8 oz (72 kg)    Physical Exam Vitals reviewed.  Constitutional:      Appearance: Normal appearance.  HENT:     Nose: Nose normal.     Mouth/Throat:     Mouth: Mucous membranes are moist.  Eyes:     General: No scleral icterus.    Conjunctiva/sclera: Conjunctivae normal.  Cardiovascular:     Rate and Rhythm: Normal rate and regular rhythm.     Heart sounds: No murmur heard.    No gallop.  Pulmonary:     Effort: Pulmonary effort is normal.     Breath sounds: No stridor. No wheezing, rhonchi or rales.  Abdominal:     General: Abdomen is flat.     Palpations: There is no mass.     Tenderness: There is no abdominal tenderness. There is no guarding.     Hernia: No hernia is present.  Musculoskeletal:        General: Normal range of motion.      Cervical back: Neck supple.     Right lower leg: No edema.     Left lower leg: No edema.  Lymphadenopathy:     Cervical: No cervical adenopathy.  Skin:    General: Skin is warm and dry.     Coloration: Skin is not pale.  Neurological:     General: No focal deficit present.     Mental Status: She is alert and oriented to person, place, and time. Mental status is at baseline.  Psychiatric:        Mood and Affect: Mood normal.        Behavior: Behavior normal.        Thought Content: Thought content normal.        Judgment: Judgment normal.  Lab Results  Component Value Date   WBC 4.8 06/04/2023   HGB 14.1 06/04/2023   HCT 42.3 06/04/2023   PLT 215.0 06/04/2023   GLUCOSE 154 (H) 06/04/2023   CHOL 116 09/06/2022   TRIG 44.0 09/06/2022   HDL 79.70 09/06/2022   LDLDIRECT 105.9 07/04/2007   LDLCALC 28 09/06/2022   ALT 17 06/04/2023   AST 22 06/04/2023   NA 139 06/04/2023   K 4.9 06/04/2023   CL 105 06/04/2023   CREATININE 1.28 (H) 06/04/2023   BUN 19 06/04/2023   CO2 29 06/04/2023   TSH 2.27 06/04/2023   HGBA1C 5.6 06/04/2023   MICROALBUR 1.6 06/04/2023    No results found.  Assessment & Plan:   Essential hypertension - Her BP is well controlled. -     Basic metabolic panel; Future -     Hepatic function panel; Future -     Urinalysis, Routine w reflex microscopic; Future  Stage 3b chronic kidney disease (HCC)- Will avoid nephrotoxic agents  -     Basic metabolic panel; Future -     Microalbumin / creatinine urine ratio; Future -     Urinalysis, Routine w reflex microscopic; Future  B12 deficiency -     CBC with Differential/Platelet; Future -     Vitamin B12; Future -     Folate; Future  Acquired hypothyroidism- She is euthyroid. -     TSH; Future  Prediabetes -     Hemoglobin A1c; Future  GAD (generalized anxiety disorder) -     AMB Referral [facility] Care Management  Ataxia -     Referral to Neuro Rehab     Follow-up: Return in about 6 months  (around 12/02/2023).  Sanda Linger, MD

## 2023-06-04 NOTE — Progress Notes (Signed)
Cardiology Office Note:  .   Date:  06/04/2023  ID:  Katie [name], DOB Dec 30, 1945, MRN [id number] PCP:  Etta Grandchild, MD  Former Cardiology Providers: None [facility] Providers Cardiologist:  None , [facility] (established care June 03, 2020) Electrophysiologist:  None  Click to update primary MD,subspecialty MD or APP then REFRESH:1}    Chief Complaint  Patient presents with   Follow-up    Cardiomyopathy and heart failure.     History of Present Illness: .   Katie [name] is a 78 y.o. African-American female whose past medical history and cardiovascular risk factors includes: Cardiomyopathy, History of DVT, GERD, hypertension chronic kidney disease stage, acquired hypothyroidism, hyperlipidemia, prediabetes, advanced age, postmenopausal female.   Patient is being followed by the practice given her valvular heart disease.  Serial echocardiograms started to illustrate reduction in LVEF.  Given the cardiomyopathy she was recommended to undergo ischemic workup.  However since she was asymptomatic she did not want to undergo MPI or coronary CTA in the past.  Since her last office visit patient was seen in October 2024 by Jari Favre -during which time cardiac MRI was ordered and she was referred to advanced heart failure to establish care.  GDMT was uptitrated.  Of note Sherryll Burger was not refilled as it was cost prohibitive.  She saw Dr. Nicholes Mango in October 2024 as well as January 2025.  Cardiac MRI was performed in the interim which noted moderately reduced LVEF results noted below for further reference.  It was recommended that she continue with GDMT to maximally tolerated doses.  Of note, patient did undergo bone marrow biopsy in the past which noted small clone of plasma cells measuring about 3 to 5%, normal FISH testing, normal cytogenetics.  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   Clinically denies anginal chest pain or heart failure symptoms.  She  continues to grieve the loss of loved ones but slowly improving.  No hospitalizations or urgent care visits for cardiovascular symptoms.   Review of Systems: .   Review of Systems  Cardiovascular:  Negative for chest pain, claudication, irregular heartbeat, leg swelling, near-syncope, orthopnea, palpitations, paroxysmal nocturnal dyspnea and syncope.  Respiratory:  Negative for shortness of breath.   Hematologic/Lymphatic: Negative for bleeding problem.    Studies Reviewed:   Echocardiogram: Echo 1/22 EF 59% Lexiscan  2/22: EF 42% normal perfusion Echo 3/23 EF 45-50% Echo 9/24 EF 20-25%   Stress Testing: Exercise tetrofosmin stress test  06/23/2020: Low risk study  Cardiac MRI December 2024 1. Normal LV size, mild hypertrophy, and moderate systolic dysfunction (EF 39%)   2.  Normal RV size with mild systolic dysfunction (EF 45%)   3. RV insertion site LGE, which is a nonspecific scar pattern often seen in setting of elevated pulmonary pressures  RADIOLOGY: NA  Risk Assessment/Calculations:   N/A   Labs:       Latest Ref Rng & Units 09/29/2022    2:40 PM 09/06/2022   11:50 AM 02/14/2022   10:37 AM  CBC  WBC 4.0 - 10.5 K/uL 5.3  4.9  3.7   Hemoglobin 12.0 - 15.0 g/dL 46.9  62.9  52.8   Hematocrit 36.0 - 46.0 % 38.2  41.3  38.5   Platelets 150 - 400 K/uL 183  208.0  185.0        Latest Ref Rng & Units 02/23/2023   11:53 AM 09/29/2022    2:40 PM 09/06/2022   11:50 AM  BMP  Glucose 70 - 99 mg/dL 92  93  323   BUN 8 - 27 mg/dL 10  10  10    Creatinine 0.57 - 1.00 mg/dL 5.57  3.22  0.25   BUN/Creat Ratio 12 - 28 8     Sodium 134 - 144 mmol/L 139  141  141   Potassium 3.5 - 5.2 mmol/L 4.3  3.7  5.2   Chloride 96 - 106 mmol/L 103  108  105   CO2 20 - 29 mmol/L 22  29  30    Calcium 8.7 - 10.3 mg/dL 9.7  9.4  9.9       Latest Ref Rng & Units 02/23/2023   11:53 AM 09/29/2022    2:40 PM 09/06/2022   11:50 AM  CMP  Glucose 70 - 99 mg/dL 92  93  427   BUN 8 - 27  mg/dL 10  10  10    Creatinine 0.57 - 1.00 mg/dL 0.62  3.76  2.83   Sodium 134 - 144 mmol/L 139  141  141   Potassium 3.5 - 5.2 mmol/L 4.3  3.7  5.2   Chloride 96 - 106 mmol/L 103  108  105   CO2 20 - 29 mmol/L 22  29  30    Calcium 8.7 - 10.3 mg/dL 9.7  9.4  9.9   Total Protein 6.5 - 8.1 g/dL  6.8  7.0   Total Bilirubin 0.3 - 1.2 mg/dL  1.4  1.5   Alkaline Phos 38 - 126 U/L  50  45   AST 15 - 41 U/L  29  31   ALT 0 - 44 U/L  18  18     Lab Results  Component Value Date   CHOL 116 09/06/2022   HDL 79.70 09/06/2022   LDLCALC 28 09/06/2022   LDLDIRECT 105.9 07/04/2007   TRIG 44.0 09/06/2022   CHOLHDL 1 09/06/2022   No results for input(s): "LIPOA" in the last 8760 hours. No components found for: "NTPROBNP" No results for input(s): "PROBNP" in the last 8760 hours. Recent Labs    09/06/22 1150 10/10/22 0000  TSH 2.95 2.53    Physical Exam:    Today's Vitals   06/04/23 1037  BP: 112/70  Pulse: 83  Resp: 16  SpO2: 92%  Weight: 157 lb 12.8 oz (71.6 kg)  Height: 5\' 5"  (1.651 m)   Body mass index is 26.26 kg/m. Wt Readings from Last 3 Encounters:  06/04/23 157 lb 12.8 oz (71.6 kg)  05/25/23 158 lb 12.8 oz (72 kg)  03/13/23 161 lb 3.2 oz (73.1 kg)    Physical Exam  Constitutional: No distress.  Age appropriate, hemodynamically stable.   Eyes:  Arcus senilis  Neck: No JVD present.  Cardiovascular: Normal rate, regular rhythm, S1 normal, S2 normal, intact distal pulses and normal pulses. Exam reveals no gallop, no S3 and no S4.  Murmur heard. Holosystolic murmur is present with a grade of 3/6 at the apex radiating to the axilla. Pulmonary/Chest: Effort normal and breath sounds normal. No stridor. She has no wheezes. She has no rales.  Abdominal: Soft. Bowel sounds are normal. She exhibits no distension. There is no abdominal tenderness.  Musculoskeletal:        General: No edema.     Cervical back: Neck supple.  Neurological: She is alert and oriented to person,  place, and time. She has intact cranial nerves (2-12).  Skin: Skin is warm and moist.   Impression & Recommendation(s):  Impression:   ICD-10-CM   1. 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 Cardiomyopathy, unspecified type (HCC)  I42.9 hydrALAZINE (APRESOLINE) 25 MG tablet    spironolactone (ALDACTONE) 25 MG tablet    losartan (COZAAR) 25 MG tablet    empagliflozin (JARDIANCE) 10 MG TABS tablet    3. Essential hypertension  I10 Basic metabolic panel    hydrALAZINE (APRESOLINE) 25 MG tablet    Basic metabolic panel    4. MGUS (monoclonal gammopathy of unknown significance)  D47.2     5. Hyperlipidemia  E78.5     6. 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 twice daily. Continue Jardiance 10 mg p.o. daily, recommended to take in the morning. Continue losartan 25 mg p.o. every afternoon Increase spironolactone to 25 mg p.o. daily Will decrease hydralazine from 25 mg p.o. 3 times daily to 25 mg p.o. twice daily Patient states that she received a grant to help cover the cost of Jardiance. I will refer her to Pharm.D. to help transition her to [facility] if it affordable.  I am not placing a prescription for Entresto just yet to avoid confusion but when initiated would recommend starting with 24/26 mg p.o. twice daily She has  a follow-up appointment with Dr. Gala Romney in 6 months with an echocardiogram prior to that office visit  Essential hypertension Home and office blood pressures are well-controlled. Medications as noted above. Reemphasized importance of low-salt diet.  MGUS (monoclonal gammopathy of unknown significance) No documented evidence of cardiac amyloidosis on the recent cardiac MRI.  Hyperlipidemia Currently on Crestor 10 mg p.o. daily.   She denies myalgia or other side effects. Most recent lipids dated April 2024, independently reviewed as noted above.  LDL well-controlled-28 mg/dL Cardiology is peripherally following.  Nonrheumatic mitral valve regurgitation Remains asymptomatic. Will have it reevaluated in 6 months when she has the echocardiogram. Monitor for now   Orders Placed:  Orders Placed This Encounter  Procedures   Basic metabolic panel    Standing Status:   Future    Number of Occurrences:   1    Expected Date:   06/11/2023    Expiration Date:   06/03/2024   ECHOCARDIOGRAM COMPLETE    Standing Status:   Future    Expected Date:   12/02/2023    Expiration Date:   06/03/2024    Where should this test be performed:   [facility])    Does the patient weigh less than or greater than 250 lbs?:   Patient weighs less than 250 lbs    Perflutren DEFINITY (image enhancing agent) should be administered unless hypersensitivity or allergy exist:   Administer Perflutren    Reason for exam-Echo:   Congestive Heart Failure  I50.9   Final Medication List:    Meds ordered this encounter  Medications  hydrALAZINE (APRESOLINE) 25 MG tablet    Sig: Take 1 tablet (25 mg total) by mouth in the morning and at bedtime.    Dispense:  180 tablet    Refill:  3   spironolactone (ALDACTONE) 25 MG tablet    Sig: Take 1 tablet (25 mg total) by mouth in the morning.    Dispense:  90 tablet    Refill:  3   losartan (COZAAR) 25 MG tablet    Sig: Take 1 tablet (25 mg total)  by mouth every evening.   empagliflozin (JARDIANCE) 10 MG TABS tablet    Sig: Take 1 tablet (10 mg total) by mouth in the morning.    Medications Discontinued During This Encounter  Medication Reason   losartan (COZAAR) 25 MG tablet    spironolactone (ALDACTONE) 25 MG tablet Reorder   hydrALAZINE (APRESOLINE) 25 MG tablet    empagliflozin (JARDIANCE) 10 MG TABS tablet      Current Outpatient Medications:    ALPRAZolam (XANAX) 0.5 MG tablet, TAKE 1 TABLET BY MOUTH 3 TIMES  DAILY AS NEEDED FOR ANXIETY OR  SLEEP, Disp: 270 tablet, Rfl: 0   aspirin 81 MG chewable tablet, Chew 81 mg by mouth every morning., Disp: , Rfl:    carvedilol (COREG) 3.125 MG tablet, Take 1 tablet (3.125 mg total) by mouth 2 (two) times daily., Disp: 60 tablet, Rfl: 5   Cholecalciferol 50 MCG (2000 UT) TABS, Take 1 tablet (2,000 Units total) by mouth daily., Disp: 90 tablet, Rfl: 1   cyanocobalamin (VITAMIN B12) 1000 MCG/ML injection, INJECT 1 ML INTO THE MUSCLE EVERY MONTH, Disp: 3 mL, Rfl: 0   Dulaglutide (TRULICITY) 1.5 MG/0.5ML SOPN, Inject 1.5 mg as directed once a week., Disp: , Rfl:    famotidine (PEPCID) 20 MG tablet, Take 2 tablets by mouth once daily, Disp: 180 tablet, Rfl: 0   levothyroxine (SYNTHROID) 88 MCG tablet, Take 88 mcg by mouth daily before breakfast., Disp: , Rfl:    magnesium hydroxide (DULCOLAX) 400 MG/5ML suspension, Take 15 mLs by mouth daily as needed for mild constipation., Disp: , Rfl:    mirtazapine (REMERON SOL-TAB) 15 MG disintegrating tablet, Take 15 mg by mouth at bedtime as needed., Disp: , Rfl:    Omega-3 Fatty Acids (FISH OIL PO), Take 1,400 mg by mouth daily., Disp: , Rfl:    Polyethyl Glycol-Propyl Glycol (SYSTANE) 0.4-0.3 % SOLN, Place 1 drop into both eyes daily as needed (dry eyes)., Disp: , Rfl:    rosuvastatin (CRESTOR) 10 MG tablet, TAKE 1 TABLET BY MOUTH IN THE  MORNING, Disp: 90 tablet, Rfl: 0   empagliflozin (JARDIANCE) 10 MG TABS tablet, Take 1 tablet (10 mg total) by  mouth in the morning., Disp: , Rfl:    hydrALAZINE (APRESOLINE) 25 MG tablet, Take 1 tablet (25 mg total) by mouth in the morning and at bedtime., Disp: 180 tablet, Rfl: 3   losartan (COZAAR) 25 MG tablet, Take 1 tablet (25 mg total) by mouth every evening., Disp: , Rfl:    spironolactone (ALDACTONE) 25 MG tablet, Take 1 tablet (25 mg total) by mouth in the morning., Disp: 90 tablet, Rfl: 3  Consent:   NA  Disposition:   1 year follow-up with myself. Plans to follow-up with advanced heart failure in 6 months June 2024  Her questions and concerns were addressed to her satisfaction. She voices understanding of the recommendations provided during this encounter.    Signed, Tessa Lerner, DO, [facility]   [facility]  [address] [place], [place] [address] 06/04/2023 1:26 PM

## 2023-06-04 NOTE — Patient Instructions (Addendum)
Medication Instructions:  Your physician has recommended you make the following change in your medication:   Hydralazine changed to twice a day  Spironolactone increasing to 25 mg in the morning   Jardiance to be taken in the morning  Losartan to be taken in the evening   *If you need a refill on your cardiac medications before your next appointment, please call your pharmacy*  Lab Work: To be completed today: BMP If you have labs (blood work) drawn today and your tests are completely normal, you will receive your results only by: MyChart Message (if you have MyChart) OR A paper copy in the mail If you have any lab test that is abnormal or we need to change your treatment, we will call you to review the results.  Testing/Procedures: ECHO complete in 6 months   Your physician has requested that you have an echocardiogram. Echocardiography is a painless test that uses sound waves to create images of your heart. It provides your doctor with information about the size and shape of your heart and how well your heart's chambers and valves are working. This procedure takes approximately one hour. There are no restrictions for this procedure. Please do NOT wear cologne, perfume, aftershave, or lotions (deodorant is allowed). Please arrive 15 minutes prior to your appointment time.  Please note: We ask at that you not bring children with you during ultrasound (echo/ vascular) testing. Due to room size and safety concerns, children are not allowed in the ultrasound rooms during exams. Our front office staff cannot provide observation of children in our lobby area while testing is being conducted. An adult accompanying a patient to their appointment will only be allowed in the ultrasound room at the discretion of the ultrasound technician under special circumstances. We apologize for any inconvenience.   Follow-Up: At [facility], you and your health needs are our priority.  As part of our  continuing mission to provide you with exceptional heart care, we have created designated Provider Care Teams.  These Care Teams include your primary Cardiologist (physician) and Advanced Practice Providers (APPs -  Physician Assistants and Nurse Practitioners) who all work together to provide you with the care you need, when you need it.  We recommend signing up for the patient portal called "MyChart".  Sign up information is provided on this After Visit Summary.  MyChart is used to connect with patients for Virtual Visits (Telemedicine).  Patients are able to view lab/test results, encounter notes, upcoming appointments, etc.  Non-urgent messages can be sent to your provider as well.   To learn more about what you can do with MyChart, go to [email or web address].    Your next appointment:   6 month(s)  The format for your next appointment:   In Person  Provider:   Jari Favre, PA-C, Ronie Spies, PA-C, Robin Searing, NP, Jacolyn Reedy, PA-C, Eligha Bridegroom, NP, Tereso Newcomer, PA-C, or Perlie Gold, PA-C     Then, Dr. Odis Hollingshead will plan to see you again in 1 year(s).{

## 2023-06-04 NOTE — Telephone Encounter (Signed)
Advanced Heart Failure Patient Advocate Encounter  Received message that Jardiance grant was not processing. Called pharmacy. Provided grant information again. Co-pay now $0. Patient updated.  Archer Asa, CPhT

## 2023-06-04 NOTE — Patient Instructions (Signed)

## 2023-06-05 ENCOUNTER — Telehealth: Payer: Self-pay | Admitting: *Deleted

## 2023-06-05 ENCOUNTER — Encounter: Payer: Self-pay | Admitting: Internal Medicine

## 2023-06-05 LAB — URINALYSIS, ROUTINE W REFLEX MICROSCOPIC
Bilirubin Urine: NEGATIVE
Hgb urine dipstick: NEGATIVE
Ketones, ur: NEGATIVE
Nitrite: NEGATIVE
Specific Gravity, Urine: 1.015 (ref 1.000–1.030)
Total Protein, Urine: NEGATIVE
Urine Glucose: >=1000 — AB
Urobilinogen, UA: 0.2 (ref 0.0–1.0)
pH: 6 (ref 5.0–8.0)

## 2023-06-05 LAB — BASIC METABOLIC PANEL
BUN/Creatinine Ratio: 13 (ref 12–28)
BUN: 19 mg/dL (ref 8–27)
CO2: 26 mmol/L (ref 20–29)
Calcium: 10.1 mg/dL (ref 8.7–10.3)
Chloride: 104 mmol/L (ref 96–106)
Creatinine, Ser: 1.42 mg/dL — ABNORMAL HIGH (ref 0.57–1.00)
Glucose: 97 mg/dL (ref 70–99)
Potassium: 5 mmol/L (ref 3.5–5.2)
Sodium: 139 mmol/L (ref 134–144)
eGFR: 38 mL/min/{1.73_m2} — ABNORMAL LOW (ref >59)

## 2023-06-05 LAB — FOLATE: Folate: 22.2 ng/mL (ref >5.9)

## 2023-06-05 LAB — VITAMIN B12: Vitamin B-12: 1452 pg/mL — ABNORMAL HIGH (ref 211–911)

## 2023-06-05 NOTE — Progress Notes (Unsigned)
Complex Care Management Note Care Guide Note  06/05/2023 Name: Katie [name] MRN: [id number] DOB: 1945-07-16   Complex Care Management Outreach Attempts: An unsuccessful telephone outreach was attempted today to offer the patient information about available complex care management services.  Follow Up Plan:  Additional outreach attempts will be made to offer the patient complex care management information and services.   Encounter Outcome:  No Answer  Burman Nieves, CMA, Care Guide [facility]  [facility] Guide Direct Dial: [phone]  Fax: [phone] Website: [email or web address]

## 2023-06-06 NOTE — Progress Notes (Signed)
Complex Care Management Note  Care Guide Note 06/06/2023 Name: Kayliegh Jerzak MRN: [id number] DOB: 1945/08/11  Solaris Kozloff is a 78 y.o. year old female who sees Etta Grandchild, MD for primary care. I reached out to [name] by phone today to offer complex care management services.  Ms. Barreiro was given information about Complex Care Management services today including:   The Complex Care Management services include support from the care team which includes your Nurse Coordinator, Clinical Social Worker, or Pharmacist.  The Complex Care Management team is here to help remove barriers to the health concerns and goals most important to you. Complex Care Management services are voluntary, and the patient may decline or stop services at any time by request to their care team member.   Complex Care Management Consent Status: Patient agreed to services and verbal consent obtained.   Follow up plan:  Telephone appointment with complex care management team member scheduled for:  06/11/2023  Encounter Outcome:  Patient Scheduled  Burman Nieves, CMA, Care Guide [facility]  [facility], [facility] Guide Direct Dial: [phone]  Fax: [phone] Website: [email or web address]

## 2023-06-07 ENCOUNTER — Ambulatory Visit (HOSPITAL_COMMUNITY): Payer: Medicare Other

## 2023-06-08 ENCOUNTER — Telehealth: Payer: Self-pay | Admitting: Cardiology

## 2023-06-08 ENCOUNTER — Ambulatory Visit: Payer: Medicare Other | Admitting: Physical Therapy

## 2023-06-08 DIAGNOSIS — Z79899 Other long term (current) drug therapy: Secondary | ICD-10-CM

## 2023-06-08 NOTE — Telephone Encounter (Signed)
Spoke with patient and she is aware of lab results and provider recommendations.  Lab is ordered

## 2023-06-08 NOTE — Telephone Encounter (Signed)
Patient called stating she received a message yesterday about her labs results.  She is not sure if she needs to go for repeat lab work. She would also like a copy of her lab results sent her as well.

## 2023-06-11 ENCOUNTER — Ambulatory Visit: Payer: Self-pay | Admitting: Licensed Clinical Social Worker

## 2023-06-11 NOTE — Patient Instructions (Signed)
Visit Information  Thank you for taking time to visit with me today. Please don't hesitate to contact me if I can be of assistance to you.   Following are the goals we discussed today:   Goals Addressed             This Visit's Progress    Care Coordination       Activities and task to complete in order to accomplish goals.   You have decided not to move forward with counseling and would like to work with me on brief coping skills to assist you with managing anxiety and grief Start / continue relaxed breathing 3 times daily Continue with compliance of taking medication prescribed by Doctor Self Support options  (when ready consider engaging in griefshare)         Our next appointment is by telephone on 06/25/2023 at 3pm  Please call the care guide team at [phone] if you need to cancel or reschedule your appointment.   If you are experiencing a Mental Health or Behavioral Health Crisis or need someone to talk to, please call 911   Patient verbalizes understanding of instructions and care plan provided today and agrees to view in MyChart. Active MyChart status and patient understanding of how to access instructions and care plan via MyChart confirmed with patient.     Telephone follow up appointment with care management team member scheduled for:06/25/2023  Gwyndolyn Saxon MSW, LCSW Licensed Clinical Social Worker  [facility], Population Health Direct Dial: [phone]  Fax: [phone]

## 2023-06-11 NOTE — Patient Outreach (Signed)
  Care Coordination   Initial Visit Note   06/11/2023 Name: Katie [name] MRN: [id number] DOB: 1946-01-29  Katie [name] is a 78 y.o. year old female who sees Etta Grandchild, MD for primary care. I spoke with  [name] by phone today.  What matters to the patients health and wellness today?  Pt reports spouse passed away 2025-07-02and sister October 24. Patient is grieving loss of spouse and sister which is contributing to symptoms related to anxiety. Pt given information for griefshare and mindfulness to alleviate symptoms. LCSW A. Felton Clinton encouraged pt to continue taking medication as directed by prescribing physician.     Goals Addressed             This Visit's Progress    Care Coordination       Activities and task to complete in order to accomplish goals.   You have decided not to move forward with counseling and would like to work with me on brief coping skills to assist you with managing anxiety and grief Start / continue relaxed breathing 3 times daily Continue with compliance of taking medication prescribed by Doctor Self Support options  (when ready consider engaging in griefshare)         SDOH assessments and interventions completed:  Yes  SDOH Interventions Today    Flowsheet Row Most Recent Value  SDOH Interventions   Food Insecurity Interventions Intervention Not Indicated  Housing Interventions Intervention Not Indicated  Transportation Interventions Intervention Not Indicated  Utilities Interventions Intervention Not Indicated        Care Coordination Interventions:  Yes, provided  Interventions Today    Flowsheet Row Most Recent Value  Chronic Disease   Chronic disease during today's visit Other  Education Interventions   Education Provided Provided Education  Provided Verbal Education On [employer], Exercise  [Pt interested in physical activity,informed pt of griefshare and coping strategies such as mindfulness.]  Mental Health Interventions    Mental Health Discussed/Reviewed Mental Health Discussed, Mental Health Reviewed, Coping Strategies, Grief and Loss  [Pt reports spouse passed 2025-07-02and sister passed Oct 24. Pt reports symptoms of anxiety such as ruminating thoughts, social isolation, physical pain and worry. Pt encouraged to continue taking med as directed and consider griefshare.]       Follow up plan: Follow up call scheduled for 06/25/23     Encounter Outcome:  Patient Visit Completed   Gwyndolyn Saxon MSW, LCSW Licensed Clinical Social Worker  [facility], Population Health Direct Dial: [phone]  Fax: [phone]

## 2023-06-14 ENCOUNTER — Telehealth: Payer: Self-pay

## 2023-06-14 NOTE — Telephone Encounter (Signed)
Copied from CRM [id number]. Topic: Referral - Request for Referral >> Jun 14, 2023 10:09 AM Sonny Dandy B wrote: Did the patient discuss referral with their provider in the last year? Yes (If No - schedule appointment) (If Yes - send message)  Appointment offered? No  Type of order/referral and detailed reason for visit: endocrinologist   Preference of office, provider, location: Jolene Schimke located at [address], [place], [place] [address]  86 [address] [phone]  If referral order, have you been seen by this specialty before? No (If Yes, this issue or another issue? When? Where?  Can we respond through MyChart? No

## 2023-06-15 ENCOUNTER — Other Ambulatory Visit: Payer: Self-pay

## 2023-06-15 DIAGNOSIS — Z79899 Other long term (current) drug therapy: Secondary | ICD-10-CM

## 2023-06-15 DIAGNOSIS — R7303 Prediabetes: Secondary | ICD-10-CM

## 2023-06-15 NOTE — Telephone Encounter (Signed)
 May you please place this referral ?

## 2023-06-15 NOTE — Telephone Encounter (Signed)
 Referral has been placed.

## 2023-06-16 ENCOUNTER — Telehealth: Payer: Self-pay | Admitting: Internal Medicine

## 2023-06-16 ENCOUNTER — Other Ambulatory Visit (INDEPENDENT_AMBULATORY_CARE_PROVIDER_SITE_OTHER): Payer: Medicare Other | Admitting: Cardiology

## 2023-06-16 DIAGNOSIS — I5042 Chronic combined systolic (congestive) and diastolic (congestive) heart failure: Secondary | ICD-10-CM | POA: Diagnosis not present

## 2023-06-16 DIAGNOSIS — E875 Hyperkalemia: Secondary | ICD-10-CM | POA: Diagnosis not present

## 2023-06-16 LAB — BASIC METABOLIC PANEL
BUN/Creatinine Ratio: 11 — ABNORMAL LOW (ref 12–28)
BUN: 16 mg/dL (ref 8–27)
CO2: 26 mmol/L (ref 20–29)
Calcium: 9.7 mg/dL (ref 8.7–10.3)
Chloride: 105 mmol/L (ref 96–106)
Creatinine, Ser: 1.41 mg/dL — ABNORMAL HIGH (ref 0.57–1.00)
Glucose: 101 mg/dL — ABNORMAL HIGH (ref 70–99)
Potassium: 5.8 mmol/L (ref 3.5–5.2)
Sodium: 141 mmol/L (ref 134–144)
eGFR: 38 mL/min/{1.73_m2} — ABNORMAL LOW (ref >59)

## 2023-06-16 MED ORDER — LOKELMA 10 G PO PACK: PACK | ORAL | 0 refills | Status: DC

## 2023-06-16 NOTE — Telephone Encounter (Signed)
[facility] called regarding elevated potassium of 5.8.  I instructed [facility] to rerun the assay given concern for error.  Patient is on spironolactone and losartan.  Attempted to call patient to instruct her to discontinue for now, for patient did not answer.

## 2023-06-16 NOTE — Telephone Encounter (Signed)
I called patient again this morning, patient did not answer. I left VM instructing her to discontinue losartan and spironolactone. I gave her anticipatory guidance on when to go to the ER.

## 2023-06-16 NOTE — Progress Notes (Signed)
ON-CALL CARDIOLOGY 06/16/23  Patient's name: Katie [name].   MRN: [id number].    DOB: 08-05-1945 Primary care provider: Etta Grandchild, MD. Primary cardiologist: Katie Lerner, DO, [facility]  Patient location: Home Physician location: Home office  Interaction regarding this patient's care today: Follow labs this morning noted Hyperkalemia.   Called the patient she is doing fine no complaints.   No OTC potassium tabs / supplements.   Does not consume high potassium diet but on occasions enjoys to eat banana, potatoes, tomatoes, and spinach.  Has had mild hyperkalemia in the past as well.   Impression:   ICD-10-CM   1. Hyperkalemia  E87.5 sodium zirconium cyclosilicate (LOKELMA) 10 g PACK packet    Basic metabolic panel    Basic metabolic panel    2. 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 Starting Day 3 take 10 gram po daily    Dispense:  30 packet    Refill:  0    Orders Placed This Encounter  Procedures   Basic metabolic panel    Recommendations: Start Lokelma 10g pack tid for first two days and starting day 3 10g packet daily  If she can afford Lokelma she will have repeat BMP on Monday Feb 3rd 2025.  If [name] is cost prohibitive than she will go to ER for hyperkalemia management.    Telephone encounter total time: 18 minutes   Katie Lerner, DO, [facility]  [address] [place], [place] [address] 06/16/2023 12:18 PM

## 2023-06-18 ENCOUNTER — Telehealth: Payer: Self-pay

## 2023-06-18 DIAGNOSIS — Z1231 Encounter for screening mammogram for malignant neoplasm of breast: Secondary | ICD-10-CM | POA: Diagnosis not present

## 2023-06-18 DIAGNOSIS — E875 Hyperkalemia: Secondary | ICD-10-CM | POA: Diagnosis not present

## 2023-06-18 LAB — HM MAMMOGRAPHY

## 2023-06-18 NOTE — Telephone Encounter (Signed)
-----   Message from [facility] sent at 06/16/2023 12:20 PM EST ----- She had high potassium levels.  I sent in a prescription for Lokelma-if she was able to afford it she is going to take the medication over the weekend and have a BMP on 06/18/2023.  If she was not able to afford well.  Please follow-up to see what happened.  Sunit Basalt, DO, [facility]

## 2023-06-18 NOTE — Telephone Encounter (Signed)
Spoke with pt over the phone and she stated that she did get her Lokelma over the weekend and is planning to get her repeat blood work today. Dr. Odis Hollingshead is aware.

## 2023-06-19 ENCOUNTER — Encounter: Payer: Self-pay | Admitting: Internal Medicine

## 2023-06-19 LAB — BASIC METABOLIC PANEL
BUN/Creatinine Ratio: 9 — ABNORMAL LOW (ref 12–28)
BUN: 13 mg/dL (ref 8–27)
CO2: 25 mmol/L (ref 20–29)
Calcium: 9.6 mg/dL (ref 8.7–10.3)
Chloride: 104 mmol/L (ref 96–106)
Creatinine, Ser: 1.41 mg/dL — ABNORMAL HIGH (ref 0.57–1.00)
Glucose: 125 mg/dL — ABNORMAL HIGH (ref 70–99)
Potassium: 4.4 mmol/L (ref 3.5–5.2)
Sodium: 141 mmol/L (ref 134–144)
eGFR: 38 mL/min/{1.73_m2} — ABNORMAL LOW (ref >59)

## 2023-06-22 ENCOUNTER — Telehealth: Payer: Self-pay | Admitting: Cardiology

## 2023-06-22 NOTE — Telephone Encounter (Signed)
 Pt c/o medication issue:  1. Name of Medication:   sodium zirconium cyclosilicate  (LOKELMA ) 10 g PACK packet   2. How are you currently taking this medication (dosage and times per day)?   3. Are you having a reaction (difficulty breathing--STAT)?   4. What is your medication issue?   Patient wants to know if she needs to take this medication with meal or should she wait before taking this medication.  Patient wants a call back to get further instruction on taking this medication.

## 2023-06-25 ENCOUNTER — Ambulatory Visit: Payer: Self-pay | Admitting: Licensed Clinical Social Worker

## 2023-06-25 ENCOUNTER — Telehealth: Payer: Self-pay

## 2023-06-25 ENCOUNTER — Telehealth: Payer: Self-pay | Admitting: Hematology and Oncology

## 2023-06-25 ENCOUNTER — Other Ambulatory Visit: Payer: Self-pay | Admitting: Internal Medicine

## 2023-06-25 DIAGNOSIS — F411 Generalized anxiety disorder: Secondary | ICD-10-CM

## 2023-06-25 NOTE — Patient Instructions (Signed)
 Visit Information  Thank you for taking time to visit with me today. Please don't hesitate to contact me if I can be of assistance to you.   Following are the goals we discussed today:   Goals Addressed             This Visit's Progress    Care Coordination       Activities and task to complete in order to accomplish goals.   You have decided not to move forward with counseling and would like to work with me on brief coping skills to assist you with managing anxiety and grief Start / continue relaxed breathing 3 times daily Continue with compliance of taking medication prescribed by Doctor Self Support options  (when ready consider engaging in [place])  Reviewed goals with Mrs. Petrick 06/25/2023        Our next appointment is by telephone on 07/06/2023 at 3pm  Please call the care guide team at [phone] if you need to cancel or reschedule your appointment.   If you are experiencing a Mental Health or Behavioral Health Crisis or need someone to talk to, please call 911   Patient verbalizes understanding of instructions and care plan provided today and agrees to view in MyChart. Active MyChart status and patient understanding of how to access instructions and care plan via MyChart confirmed with patient.     Telephone follow up appointment with care management team member scheduled for:07/09/2023  Fletcher Humble MSW, LCSW Licensed Clinical Social Worker  [facility], Population Health Direct Dial: [phone]  Fax: [phone]

## 2023-06-25 NOTE — Telephone Encounter (Signed)
 Left message to confirm Dr appointment with Dr Arno Bibles on 06/26/2023.  Advised patient to try and arrive 15 mins prior to appointment time.

## 2023-06-25 NOTE — Telephone Encounter (Signed)
 She should empty the entire contents into at least 15 ml of water, stir and drink immediately. If any powder remains in glass, add water, stir and drink right away.  Other medications should be taken >2hr before or 2 hr after taking Lokelma .  Does not matter is she takes with or without food. Just make sure she takes at least 2 hr before or after other medications

## 2023-06-25 NOTE — Telephone Encounter (Signed)
 Patient called to reschedule her upcoming appointment due to weather conditions.

## 2023-06-25 NOTE — Patient Outreach (Signed)
  Care Coordination   Follow Up Visit Note   06/25/2023 Name: Jadea Bibbee MRN: [id number] DOB: 1945/08/23  Suleyka Sabogal is a 78 y.o. year old female who sees Arcadio Knuckles, MD for primary care. I spoke with  Marshia Skene by phone today.  What matters to the patients health and wellness today?  Mrs. Osuch and LCSW A. Sway Guttierrez completed follow up via phone. Mrs. Sedlar reports improvement in mood and engaged in group grief therapy at her local church. Mrs. Giacomo is encouraged to increase social engagement with family and friends. LCSW A. Jia Dottavio provided emotional support.     Goals Addressed             This Visit's Progress    Care Coordination       Activities and task to complete in order to accomplish goals.   You have decided not to move forward with counseling and would like to work with me on brief coping skills to assist you with managing anxiety and grief Start / continue relaxed breathing 3 times daily Continue with compliance of taking medication prescribed by Doctor Self Support options  (when ready consider engaging in [place])  Reviewed goals with Mrs. Redner 06/25/2023        SDOH assessments and interventions completed:  Yes  SDOH Interventions Today    Flowsheet Row Most Recent Value  SDOH Interventions   Food Insecurity Interventions Intervention Not Indicated  Housing Interventions Intervention Not Indicated  Transportation Interventions Intervention Not Indicated  Utilities Interventions Intervention Not Indicated        Care Coordination Interventions:  Yes, provided  Interventions Today    Flowsheet Row Most Recent Value  Mental Health Interventions   Mental Health Discussed/Reviewed Mental Health Reviewed, Grief and Loss, Coping Strategies  [Mrs. Slee reports improvement in mood. According to Mrs. Proano she is attending group grief counseling.]       Follow up plan: Follow up call scheduled for 07/09/2023    Encounter Outcome:  Patient Visit  Completed  Fletcher Humble MSW, LCSW Licensed Clinical Social Worker  [facility], Population Health Direct Dial: [phone]  Fax: [phone]

## 2023-06-25 NOTE — Telephone Encounter (Signed)
 Spoke with patient and shared Pharmacist's response:  She should empty the entire contents into at least 15 ml of water, stir and drink immediately. If any powder remains in glass, add water, stir and drink right away.  Other medications should be taken >2hr before or 2 hr after taking Lokelma .   Does not matter is she takes with or without food. Just make sure she takes at least 2 hr before or after other medications      Patient states she has been doing this and expressed appreciate for call. No further needs voiced at this time.

## 2023-06-26 ENCOUNTER — Inpatient Hospital Stay: Payer: Medicare Other | Admitting: Hematology and Oncology

## 2023-06-27 ENCOUNTER — Ambulatory Visit: Payer: Medicare Other | Admitting: Hematology and Oncology

## 2023-06-28 ENCOUNTER — Telehealth: Payer: Self-pay | Admitting: Cardiology

## 2023-06-28 NOTE — Telephone Encounter (Signed)
Spoke with pt and let her know the lab resulted have been placed in our outgoing mailbox and they should be sent out either later today or tomorrow morning. Pt verbalized understanding and had no further questions or concerns.

## 2023-06-28 NOTE — Telephone Encounter (Signed)
Patient is requesting for [name] to mail her lab results from 06/18/23 to her. Patient would like the nurse to call once they have sent the letter. Please advise.

## 2023-07-04 ENCOUNTER — Inpatient Hospital Stay: Payer: Medicare Other | Admitting: Hematology and Oncology

## 2023-07-05 ENCOUNTER — Telehealth: Payer: Self-pay | Admitting: Cardiology

## 2023-07-05 ENCOUNTER — Telehealth: Payer: Self-pay | Admitting: Pharmacy Technician

## 2023-07-05 ENCOUNTER — Other Ambulatory Visit: Payer: Self-pay

## 2023-07-05 ENCOUNTER — Other Ambulatory Visit (HOSPITAL_COMMUNITY): Payer: Self-pay

## 2023-07-05 DIAGNOSIS — E875 Hyperkalemia: Secondary | ICD-10-CM

## 2023-07-05 DIAGNOSIS — I5042 Chronic combined systolic (congestive) and diastolic (congestive) heart failure: Secondary | ICD-10-CM

## 2023-07-05 IMAGING — CR DG FOOT COMPLETE 3+V*R*
3 series · 3 of 3 positions shown · non-contrast
Comparison: None.

CLINICAL DATA: Right foot pain

EXAM:
RIGHT FOOT COMPLETE - 3+ VIEW

[foot ap]
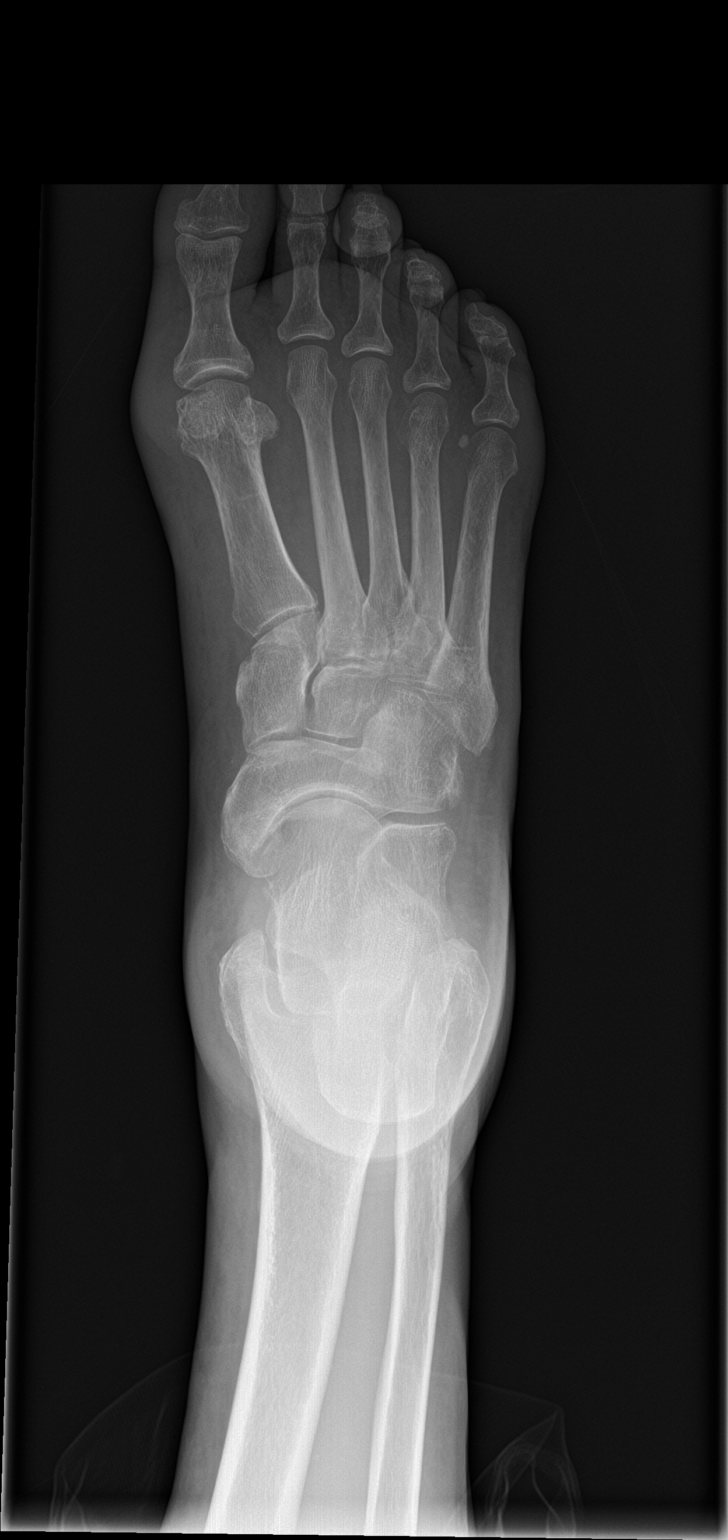

[foot obl]
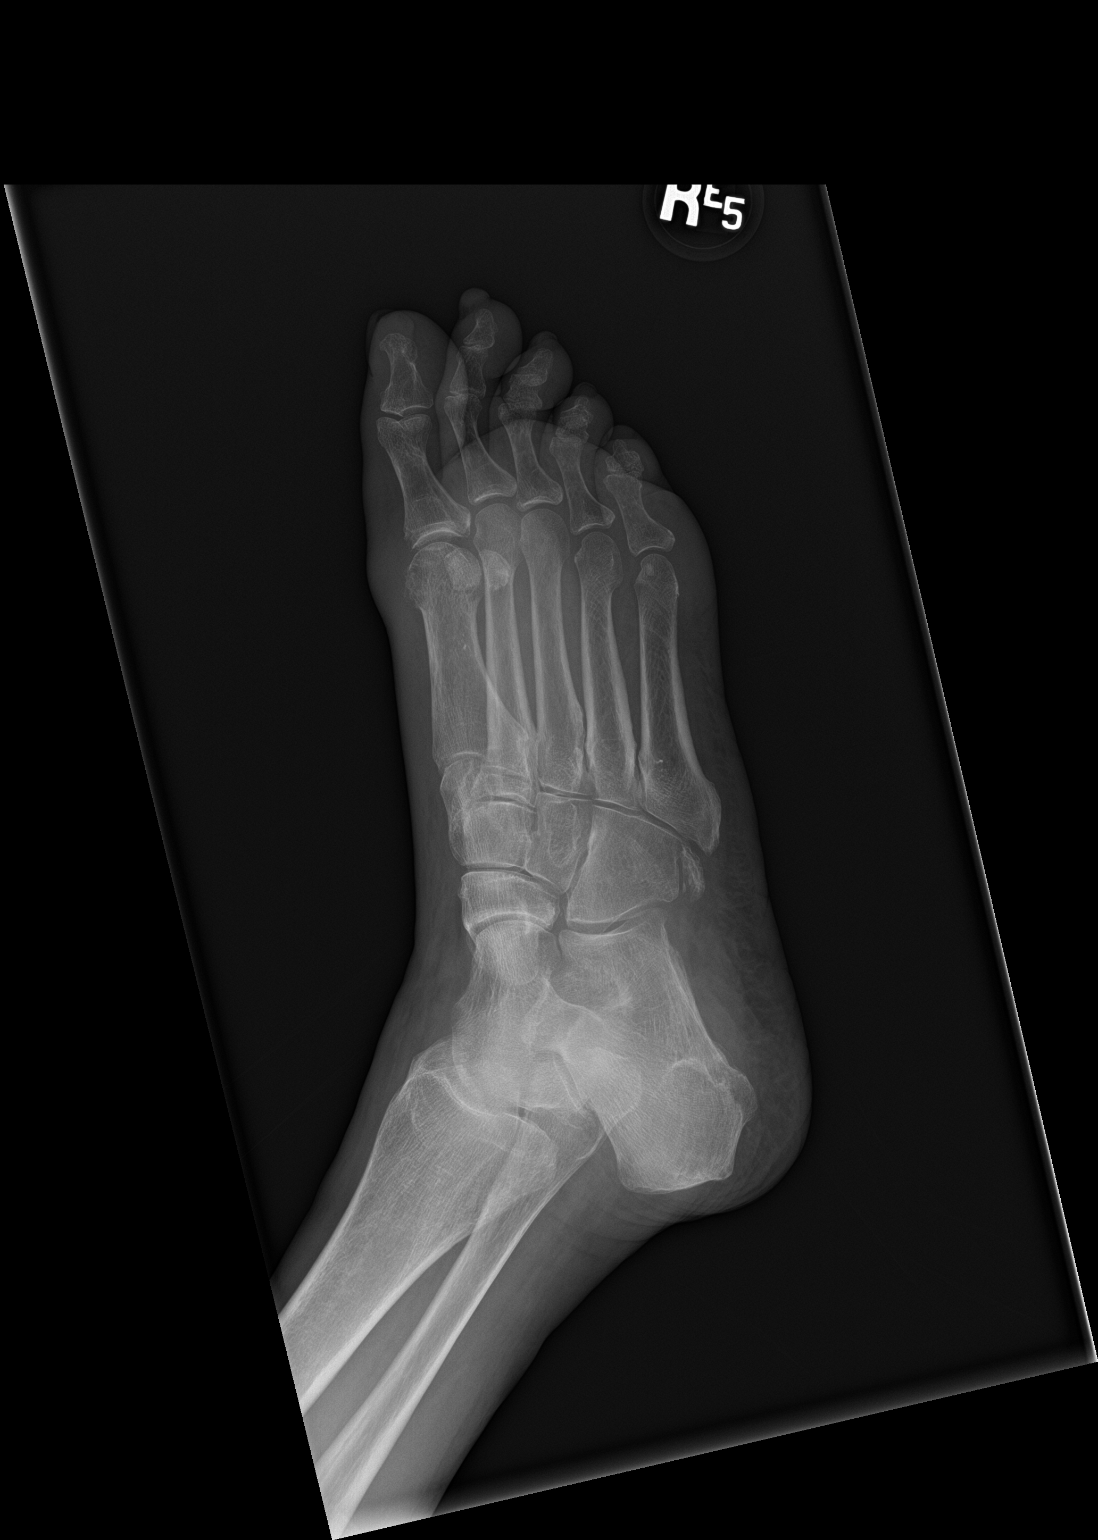

[foot lat]
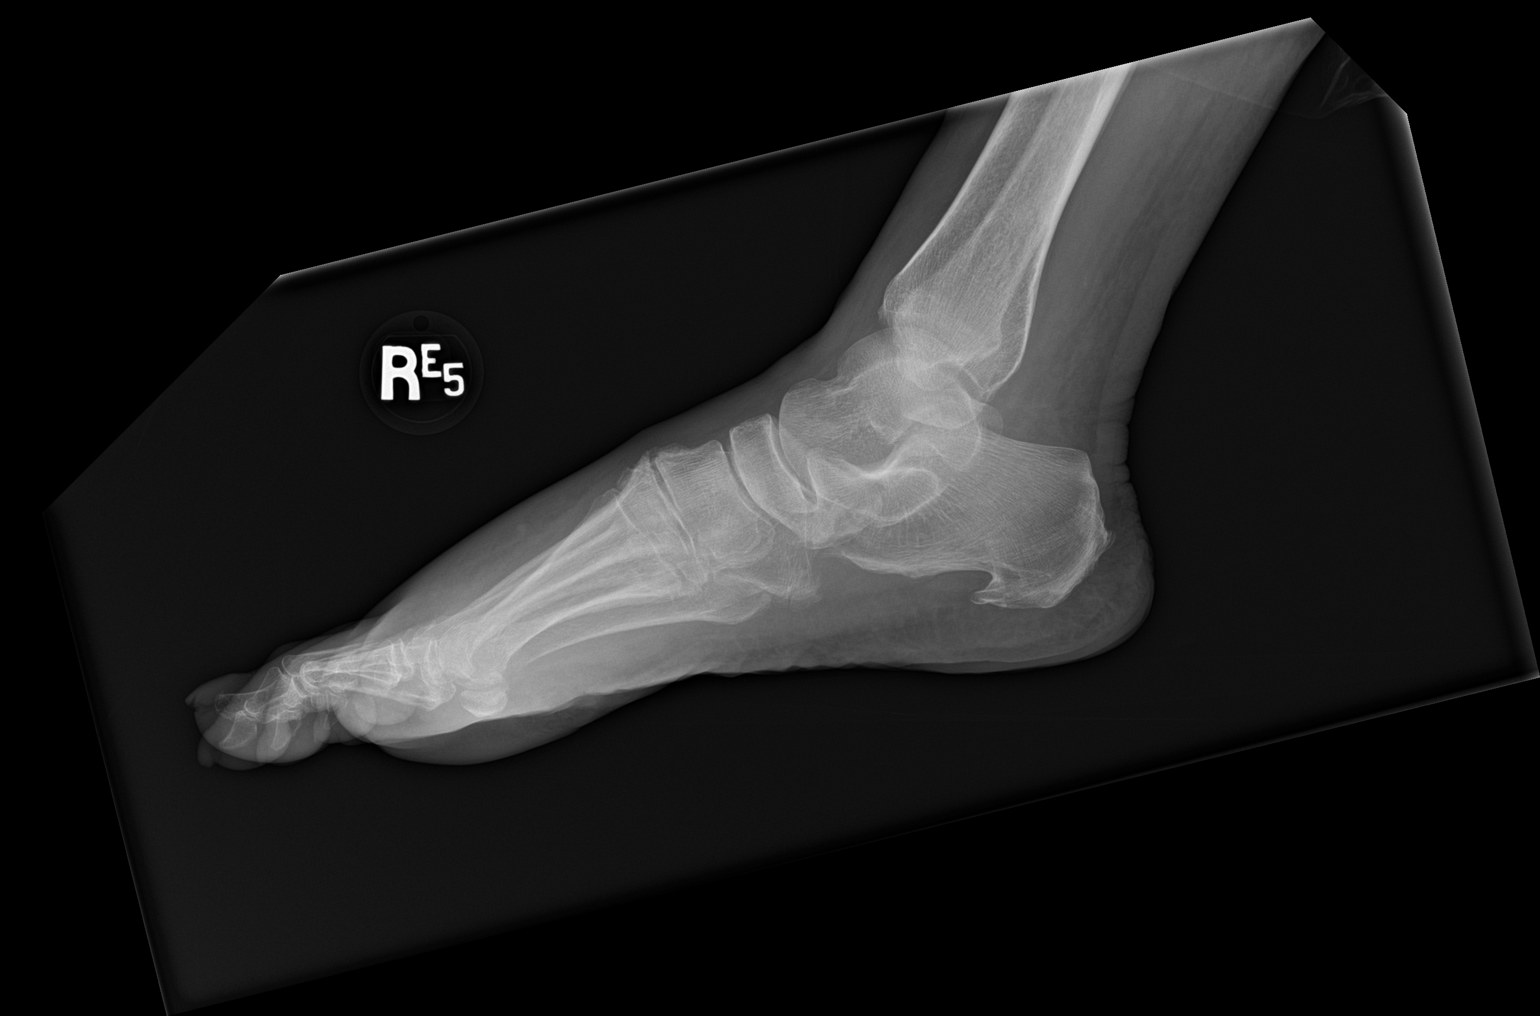

[3 of 3 positions shown; findings below may reference images not displayed]

FINDINGS: No acute fracture or dislocation identified. Degenerative changes in
the midfoot with mild joint space narrowing and dorsal osteophytes.
Mild degenerative changes at the first metatarsophalangeal joint.
Prominent plantar calcaneal spur. Mild soft tissue swelling in the
forefoot.
IMPRESSION: Degenerative changes with no acute osseous abnormality identified.
Mild soft tissue swelling.

## 2023-07-05 MED ORDER — LOKELMA 10 G PO PACK: PACK | ORAL | 0 refills | Status: DC

## 2023-07-05 NOTE — Telephone Encounter (Signed)
Please continue the medication.   Katie Temkin St[place], DO, [facility]

## 2023-07-05 NOTE — Telephone Encounter (Signed)
Can she get patient assistance?   Veltassa is an alternative, not sure what the cost will be.   Torryn Fiske Cable, DO, [facility]

## 2023-07-05 NOTE — Telephone Encounter (Signed)
Likely chronic as it helps keep her potassium remain stable.   Katie [name] [place], DO, [facility]

## 2023-07-05 NOTE — Telephone Encounter (Signed)
Pt c/o medication issue:  1. Name of Medication: Lokelma 10 mg  2. How are you currently taking this medication (dosage and times per day)? 1 daily  3. Are you having a reaction (difficulty breathing--STAT)? No   4. What is your medication issue? Pt said Dr Odis Hollingshead told her to let him know when she was almost done taking medications and she is almost done

## 2023-07-05 NOTE — Telephone Encounter (Signed)
The copay for Veltassa is 100.00 for 30 days. The [name] is coming up too soon until 07/06/23. There is no medication assistance that we have for those meds. I doubled check with Kayla.      Cherylann Banas, RN to Rx Med Assistance Team     07/05/23  1:38 PM Is there any way we can run this pt's insurance to see if they can get either Lokelma or the Veltassa for a decreased price at our [employer]? Tessa Lerner, DO to [place], RN  ST   07/05/23 12:19 PM Note Can she get patient assistance?    Veltassa is an alternative, not sure what the cost will be.    Sunit Dorado, DO, [facility]       McComb, Gerald Leitz, RN to [place], DO     07/05/23 11:46 AM What is the alternative to [facility] that you had talked about? This is the pt that stated the [facility] was expensive so if she states she doesn't want to have to buy more of it, I want to be prepared to tell her the alternative option.

## 2023-07-05 NOTE — Telephone Encounter (Signed)
The copay for Veltassa is 100.00 for 30 days. The [name] is coming up too soon until 07/06/23. There is no medication assistance that we have for those meds. I doubled check with Kayla.

## 2023-07-06 ENCOUNTER — Telehealth: Payer: Self-pay | Admitting: Cardiology

## 2023-07-06 ENCOUNTER — Telehealth: Payer: Self-pay

## 2023-07-06 DIAGNOSIS — E875 Hyperkalemia: Secondary | ICD-10-CM

## 2023-07-06 NOTE — Telephone Encounter (Signed)
Spoke with pt and stated she should have her blood work completed on 3/4 which would be 1 week after her last dose of Lokelma. Pt verbalized understanding.

## 2023-07-06 NOTE — Telephone Encounter (Signed)
Pt requesting cb to see when she needs to go have her labs drawn. She couldn't remember what she was told

## 2023-07-06 NOTE — Telephone Encounter (Signed)
Spoke with patient and confirmed appointment on 07/09/23 with Dr Al Pimple.

## 2023-07-09 ENCOUNTER — Inpatient Hospital Stay: Payer: Medicare Other

## 2023-07-09 ENCOUNTER — Ambulatory Visit: Payer: Self-pay | Admitting: Licensed Clinical Social Worker

## 2023-07-09 ENCOUNTER — Inpatient Hospital Stay: Payer: Medicare Other | Attending: Hematology and Oncology | Admitting: Hematology and Oncology

## 2023-07-09 VITALS — BP 141/64 | HR 89 | Temp 97.8°F | Resp 17 | Wt 155.3 lb

## 2023-07-09 DIAGNOSIS — D472 Monoclonal gammopathy: Secondary | ICD-10-CM | POA: Diagnosis not present

## 2023-07-09 DIAGNOSIS — D649 Anemia, unspecified: Secondary | ICD-10-CM | POA: Insufficient documentation

## 2023-07-09 LAB — CBC WITH DIFFERENTIAL/PLATELET
Abs Immature Granulocytes: 0.02 10*3/uL (ref 0.00–0.07)
Basophils Absolute: 0 10*3/uL (ref 0.0–0.1)
Basophils Relative: 1 %
Eosinophils Absolute: 0.2 10*3/uL (ref 0.0–0.5)
Eosinophils Relative: 4 %
HCT: 38.6 % (ref 36.0–46.0)
Hemoglobin: 13.6 g/dL (ref 12.0–15.0)
Immature Granulocytes: 0 %
Lymphocytes Relative: 29 %
Lymphs Abs: 1.4 10*3/uL (ref 0.7–4.0)
MCH: 28.5 pg (ref 26.0–34.0)
MCHC: 35.2 g/dL (ref 30.0–36.0)
MCV: 80.8 fL (ref 80.0–100.0)
Monocytes Absolute: 0.5 10*3/uL (ref 0.1–1.0)
Monocytes Relative: 10 %
Neutro Abs: 2.7 10*3/uL (ref 1.7–7.7)
Neutrophils Relative %: 56 %
Platelets: 186 10*3/uL (ref 150–400)
RBC: 4.78 MIL/uL (ref 3.87–5.11)
RDW: 14.2 % (ref 11.5–15.5)
WBC: 4.8 10*3/uL (ref 4.0–10.5)
nRBC: 0 % (ref 0.0–0.2)

## 2023-07-09 LAB — CMP (CANCER CENTER ONLY)
ALT: 16 U/L (ref 0–44)
AST: 23 U/L (ref 15–41)
Albumin: 3.9 g/dL (ref 3.5–5.0)
Alkaline Phosphatase: 45 U/L (ref 38–126)
Anion gap: 6 (ref 5–15)
BUN: 14 mg/dL (ref 8–23)
CO2: 31 mmol/L (ref 22–32)
Calcium: 9.8 mg/dL (ref 8.9–10.3)
Chloride: 105 mmol/L (ref 98–111)
Creatinine: 1.21 mg/dL — ABNORMAL HIGH (ref 0.44–1.00)
GFR, Estimated: 46 mL/min — ABNORMAL LOW (ref >60)
Glucose, Bld: 158 mg/dL — ABNORMAL HIGH (ref 70–99)
Potassium: 3.1 mmol/L — ABNORMAL LOW (ref 3.5–5.1)
Sodium: 142 mmol/L (ref 135–145)
Total Bilirubin: 1.6 mg/dL — ABNORMAL HIGH (ref 0.0–1.2)
Total Protein: 7 g/dL (ref 6.5–8.1)

## 2023-07-09 NOTE — Patient Outreach (Signed)
 Care Coordination   Follow Up Visit Note   07/09/2023 Name: Katie [name] MRN: [id number] DOB: 08-01-1945  Katie [name] is a 78 y.o. year old female who sees Etta Grandchild, MD for primary care. I spoke with  Katie [name] by phone today.  What matters to the patients health and wellness today?  Katie [name] and LCSW A.Felton Clinton completed follow up. Katie [name] reports improvement in mood and feelings associated with grief.     Goals Addressed             This Visit's Progress    Care Coordination       Activities and task to complete in order to accomplish goals.   You have decided not to move forward with counseling and would like to work with me on brief coping skills to assist you with managing anxiety and grief Start / continue relaxed breathing 3 times daily Continue with compliance of taking medication prescribed by Doctor Self Support options  (when ready consider engaging in [place])  Reviewed goals with Katie [name] 07/09/2023        SDOH assessments and interventions completed:  Yes  SDOH Interventions Today    Flowsheet Row Most Recent Value  SDOH Interventions   Food Insecurity Interventions Intervention Not Indicated  Housing Interventions Intervention Not Indicated  Transportation Interventions Intervention Not Indicated  Utilities Interventions Intervention Not Indicated        Care Coordination Interventions:  Yes, provided  Interventions Today    Flowsheet Row Most Recent Value  Mental Health Interventions   Mental Health Discussed/Reviewed Mental Health Reviewed, Grief and Loss, Coping Strategies  [Ms. Ingle reports improvement in mood however anxiety increases slightly when going to doctors office. Katie [name] encouraged to direct thoughts and steady breathes.]       Follow up plan: Follow up call scheduled for 07/23/2023    Encounter Outcome:  Patient Visit Completed   Gwyndolyn Saxon MSW, LCSW Licensed Clinical Social Worker  [facility], Population Health Direct Dial: [phone]  Fax: [phone]

## 2023-07-09 NOTE — Patient Instructions (Signed)
 Visit Information  Thank you for taking time to visit with me today. Please don't hesitate to contact me if I can be of assistance to you.   Following are the goals we discussed today:   Goals Addressed             This Visit's Progress    Care Coordination       Activities and task to complete in order to accomplish goals.   You have decided not to move forward with counseling and would like to work with me on brief coping skills to assist you with managing anxiety and grief Start / continue relaxed breathing 3 times daily Continue with compliance of taking medication prescribed by Doctor Self Support options  (when ready consider engaging in [place])  Reviewed goals with Mrs. Weyandt 07/09/2023        Our next appointment is by telephone on 07/23/2023 at 2pm  Please call the care guide team at [phone] if you need to cancel or reschedule your appointment.   If you are experiencing a Mental Health or Behavioral Health Crisis or need someone to talk to, please call 911   Patient verbalizes understanding of instructions and care plan provided today and agrees to view in MyChart. Active MyChart status and patient understanding of how to access instructions and care plan via MyChart confirmed with patient.     Telephone follow up appointment with care management team member scheduled for:07/23/2023  Gwyndolyn Saxon MSW, LCSW Licensed Clinical Social Worker  [facility], Population Health Direct Dial: [phone]  Fax: [phone]

## 2023-07-09 NOTE — Assessment & Plan Note (Addendum)
 This is a very pleasant 78 year old female patient with past medical history significant for CKD who has recently seen her nephrologist because of worsening chronic kidney disease had some lab evaluation and was found to have monoclonal gammopathy on her labs and hence referred to hematology for further recommendations.   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 Bone marrow biopsy demonstrated small clone of plasma cells measuring about 3 to 5%, normal FISH testing, normal cytogenetics. Given low risk MGUS, she is here for surveillance.  Monoclonal Gammopathy of Undetermined Significance (MGUS) Asymptomatic with no new symptoms suggestive of progression to Multiple Myeloma. Discussed the nature of MGUS as a precancerous condition and the need for regular monitoring. -Order blood work to monitor abnormal protein levels and kidney function. -Plan to follow up in 1 year unless lab results indicate a need for earlier intervention.  Cardiac Concerns Patient under the care of a cardiologist (Dr. Odis Hollingshead) for unspecified heart issues. No new symptoms reported. -No changes to current management plan as this is being managed by the cardiologist.  Sleep Disturbances Previously reported difficulty sleeping, improved with medication from primary care provider. -Continue current management plan as prescribed by primary care provider.  Appetite Loss Likely secondary to stress and anxiety, improving. -No specific intervention needed at this time, continue to monitor.  Constipation Occasional, likely related to diet and hydration. -Advise to maintain adequate hydration and fiber intake.

## 2023-07-09 NOTE — Progress Notes (Signed)
 [facility] CONSULT NOTE  Patient Care Team: Etta Grandchild, MD as PCP - General (Internal Medicine) Tessa Lerner, DO as PCP - Cardiology (Cardiology) Bensimhon, Bevelyn Buckles, MD as PCP - Advanced Heart Failure (Cardiology) Altheimer, Casimiro Needle, MD (Endocrinology) Meryl Dare, MD (Inactive) (Gastroenterology) Larey Dresser, DPM (Podiatry) Jethro Bolus, MD (Ophthalmology) Chevis Pretty, Dimas Aguas, MD (Gynecology) Janalyn Harder, MD (Inactive) as Consulting Physician (Dermatology)  CHIEF COMPLAINTS/PURPOSE OF CONSULTATION:  MGUS  ASSESSMENT & PLAN:   MGUS (monoclonal gammopathy of unknown significance) This is a very pleasant 78 year old female patient with past medical history significant for CKD who has recently seen her nephrologist because of worsening chronic kidney disease had some lab evaluation and was found to have monoclonal gammopathy on her labs and hence referred to hematology for further recommendations.   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 Bone marrow biopsy demonstrated small clone of plasma cells measuring about 3 to 5%, normal FISH testing, normal cytogenetics. Given low risk MGUS, she is here for surveillance.  Monoclonal Gammopathy of Undetermined Significance (MGUS) Asymptomatic with no new symptoms suggestive of progression to Multiple Myeloma. Discussed the nature of MGUS as a precancerous condition and the need for regular monitoring. -Order blood work to monitor abnormal protein levels and kidney function. -Plan to follow up in 1 year unless lab results indicate a need for earlier intervention.  Cardiac Concerns Patient under the care of a cardiologist (Dr. Odis Hollingshead) for unspecified heart issues. No new symptoms reported. -No changes to current management plan as this is being managed by the cardiologist.  Sleep Disturbances Previously reported difficulty sleeping,  improved with medication from primary care provider. -Continue current management plan as prescribed by primary care provider.  Appetite Loss Likely secondary to stress and anxiety, improving. -No specific intervention needed at this time, continue to monitor.  Constipation Occasional, likely related to diet and hydration. -Advise to maintain adequate hydration and fiber intake.   Orders Placed This Encounter  Procedures   CBC with Differential/Platelet    Standing Status:   Standing    Number of Occurrences:   22    Expiration Date:   07/08/2024   CMP (Cancer Center only)    Standing Status:   Future    Expiration Date:   07/08/2024   SPEP with reflex to IFE    Standing Status:   Future    Expiration Date:   07/08/2024   Kappa/lambda light chains    Standing Status:   Future    Expiration Date:   07/08/2024   IgG, IgA, IgM    Standing Status:   Future    Expiration Date:   07/08/2024   Thank you for consulting [name] in the care of this patient.  Please not hesitate to contact us with any additional questions or concerns.  HISTORY OF PRESENTING ILLNESS:   Katie [name] 78 y.o. female is here because of MGUS  Interval History  Discussed the use of AI scribe software for clinical note transcription with the patient, who gave verbal consent to proceed.  History of Present Illness    Katie [name] is a 78 year old female with MGUS who presents for routine follow-up.  She has a history of monoclonal gammopathy of undetermined significance (MGUS) and is here for routine follow-up. No new symptoms related to MGUS, such as fevers, night sweats, or changes in breathing. She is able to perform daily activities,  including climbing stairs and exercising at the gym.  She is experiencing significant stress and anxiety due to the recent loss of her husband. This emotional distress has impacted her sleep and appetite. Initially, she had difficulties with sleep, which have improved with medication  prescribed by her primary care doctor. Her appetite decreased due to anxiety but is now returning to normal. She is satisfied with her current weight.  She experiences occasional constipation, which she attributes to dietary factors and hydration. No issues with urination  Rest of the pertinent 10 point ROS reviewed and negative.  MEDICAL HISTORY:  Past Medical History:  Diagnosis Date   Acute venous embolism and thrombosis of unspecified deep vessels of lower extremity 2001   RLE DVT and PE, coumadin x 62mo, now ASSA   Allergy    Anxiety state, unspecified    Blood transfusion without reported diagnosis    Cataract    Chronic idiopathic constipation 11/02/2016   Clotting disorder (HCC)    Degeneration of cervical intervertebral disc    Gallstones    History of DVT (deep vein thrombosis)    Iron deficiency anemia secondary to blood loss (chronic)    Irritable bowel syndrome    Lumbago    Other B-complex deficiencies    Personal history of colonic polyps    adenomatous   Pure hypercholesterolemia    Reflux esophagitis    Tubular adenoma of colon 09/2002   Type II or unspecified type diabetes mellitus without mention of complication, not stated as uncontrolled    Unspecified essential hypertension    Unspecified hypothyroidism    Unspecified iridocyclitis    Unspecified venous (peripheral) insufficiency    Unspecified vitamin D deficiency     SURGICAL HISTORY: Past Surgical History:  Procedure Laterality Date   BIOPSY  04/24/2018   Procedure: BIOPSY;  Surgeon: Lemar Lofty., MD;  Location: [facility];  Service: Gastroenterology;;   Fidela Salisbury RELEASE     CATARACT EXTRACTION Bilateral 08/29/2017,09/19/17   ESOPHAGOGASTRODUODENOSCOPY (EGD) WITH PROPOFOL N/A 04/24/2018   Procedure: ESOPHAGOGASTRODUODENOSCOPY (EGD) WITH PROPOFOL;  Surgeon: Lemar Lofty., MD;  Location: [facility];  Service: Gastroenterology;  Laterality: N/A;  with EMR   FEMORAL HERNIA  REPAIR     OOPHORECTOMY     POLYPECTOMY  04/24/2018   Procedure: POLYPECTOMY;  Surgeon: Mansouraty, Netty Starring., MD;  Location: [facility];  Service: Gastroenterology;;   UPPER ESOPHAGEAL ENDOSCOPIC ULTRASOUND (EUS)  04/24/2018   Procedure: UPPER ESOPHAGEAL ENDOSCOPIC ULTRASOUND (EUS);  Surgeon: Meridee Score Netty Starring., MD;  Location: [facility];  Service: Gastroenterology;;   VESICOVAGINAL FISTULA CLOSURE W/ TAH      SOCIAL HISTORY: Social History   Socioeconomic History   Marital status: Married    Spouse name: Sheniya Garciaperez   Number of children: 0   Years of education: masters   Highest education level: Not on file  Occupational History   Occupation: record Chief Executive Officer: [employer]  Tobacco Use   Smoking status: Never   Smokeless tobacco: Never  Vaping Use   Vaping status: Never Used  Substance and Sexual Activity   Alcohol use: Yes    Alcohol/week: 0.0 standard drinks of alcohol    Comment: occasional-wine    Drug use: No   Sexual activity: Not Currently  Other Topics Concern   Not on file  Social History Narrative   Lives with husband in a 2 story home with a basement.  Has no children.     Retired from the police  department.     Education: masters (guidance and counseling).    Social Drivers of Corporate investment banker Strain: Low Risk  (02/26/2023)   Overall Financial Resource Strain (CARDIA)    Difficulty of Paying Living Expenses: Not hard at all  Food Insecurity: No Food Insecurity (06/25/2023)   Hunger Vital Sign    Worried About Running Out of Food in the Last Year: Never true    Ran Out of Food in the Last Year: Never true  Transportation Needs: No Transportation Needs (06/25/2023)   PRAPARE - Administrator, Civil Service (Medical): No    Lack of Transportation (Non-Medical): No  Physical Activity: Sufficiently Active (02/26/2023)   Exercise Vital Sign    Days of Exercise per Week: 3 days    Minutes of Exercise per  Session: 60 min  Stress: No Stress Concern Present (02/26/2023)   [employer] of Occupational Health - Occupational Stress Questionnaire    Feeling of Stress : Not at all  Social Connections: Socially Integrated (02/26/2023)   Social Connection and Isolation Panel [NHANES]    Frequency of Communication with Friends and Family: More than three times a week    Frequency of Social Gatherings with Friends and Family: More than three times a week    Attends Religious Services: More than 4 times per year    Active Member of [employer] or Organizations: Yes    Attends Engineer, structural: More than 4 times per year    Marital Status: Married  Catering manager Violence: Not At Risk (06/25/2023)   Humiliation, Afraid, Rape, and Kick questionnaire    Fear of Current or Ex-Partner: No    Emotionally Abused: No    Physically Abused: No    Sexually Abused: No    FAMILY HISTORY: Family History  Problem Relation Age of Onset   Prostate cancer Father    Diabetes Mother        retinopathy/blind   Deep vein thrombosis Mother    Hypertension Mother    Healthy Sister    Colon cancer Neg Hx    Esophageal cancer Neg Hx    Stomach cancer Neg Hx    Rectal cancer Neg Hx     ALLERGIES:  is allergic to lisinopril, metoprolol succinate, penicillins, sulfamethoxazole, sulfamethoxazole-trimethoprim, trimethoprim, and metformin.  MEDICATIONS:  Current Outpatient Medications  Medication Sig Dispense Refill   ALPRAZolam (XANAX) 0.5 MG tablet TAKE 1 TABLET BY MOUTH THREE TIMES DAILY AS NEEDED FOR ANXIETY OR SLEEP 270 tablet 0   aspirin 81 MG chewable tablet Chew 81 mg by mouth every morning.     carvedilol (COREG) 3.125 MG tablet Take 1 tablet (3.125 mg total) by mouth 2 (two) times daily. 60 tablet 5   Cholecalciferol 50 MCG (2000 UT) TABS Take 1 tablet (2,000 Units total) by mouth daily. 90 tablet 1   cyanocobalamin (VITAMIN B12) 1000 MCG/ML injection INJECT 1 ML INTO THE MUSCLE EVERY MONTH 3  mL 0   Dulaglutide (TRULICITY) 1.5 MG/0.5ML SOPN Inject 1.5 mg as directed once a week.     empagliflozin (JARDIANCE) 10 MG TABS tablet Take 1 tablet (10 mg total) by mouth in the morning.     famotidine (PEPCID) 20 MG tablet Take 2 tablets by mouth once daily 180 tablet 0   hydrALAZINE (APRESOLINE) 25 MG tablet Take 1 tablet (25 mg total) by mouth in the morning and at bedtime. 180 tablet 3   levothyroxine (SYNTHROID) 88 MCG tablet Take 88 mcg  by mouth daily before breakfast.     losartan (COZAAR) 25 MG tablet Take 1 tablet (25 mg total) by mouth every evening.     magnesium hydroxide (DULCOLAX) 400 MG/5ML suspension Take 15 mLs by mouth daily as needed for mild constipation.     mirtazapine (REMERON SOL-TAB) 15 MG disintegrating tablet Take 15 mg by mouth at bedtime as needed.     Omega-3 Fatty Acids (FISH OIL PO) Take 1,400 mg by mouth daily.     Polyethyl Glycol-Propyl Glycol (SYSTANE) 0.4-0.3 % SOLN Place 1 drop into both eyes daily as needed (dry eyes).     rosuvastatin (CRESTOR) 10 MG tablet TAKE 1 TABLET BY MOUTH IN THE  MORNING 90 tablet 0   sodium zirconium cyclosilicate (LOKELMA) 10 g PACK packet Take 10 gram po daily 30 packet 0   spironolactone (ALDACTONE) 25 MG tablet Take 1 tablet (25 mg total) by mouth in the morning. 90 tablet 3   No current facility-administered medications for this visit.   PHYSICAL EXAMINATION: ECOG PERFORMANCE STATUS: 0 - Asymptomatic  Vitals:   07/09/23 1137  BP: (!) 141/64  Pulse: 89  Resp: 17  Temp: 97.8 F (36.6 C)  SpO2: 100%    Filed Weights   07/09/23 1137  Weight: 155 lb 4.8 oz (70.4 kg)    Physical Exam Constitutional:      Appearance: Normal appearance.  Cardiovascular:     Rate and Rhythm: Normal rate and regular rhythm.     Pulses: Normal pulses.     Heart sounds: Normal heart sounds.  Pulmonary:     Effort: Pulmonary effort is normal.     Breath sounds: Normal breath sounds.  Musculoskeletal:        General: No swelling  or tenderness.     Cervical back: Normal range of motion and neck supple. No rigidity.  Lymphadenopathy:     Cervical: No cervical adenopathy.  Skin:    General: Skin is warm and dry.  Neurological:     General: No focal deficit present.     Mental Status: She is alert.  Psychiatric:        Mood and Affect: Mood normal.      LABORATORY DATA:  I have reviewed the data as listed Lab Results  Component Value Date   WBC 4.8 06/04/2023   HGB 14.1 06/04/2023   HCT 42.3 06/04/2023   MCV 84.2 06/04/2023   PLT 215.0 06/04/2023     Chemistry      Component Value Date/Time   NA 141 06/18/2023 1138   K 4.4 06/18/2023 1138   CL 104 06/18/2023 1138   CO2 25 06/18/2023 1138   BUN 13 06/18/2023 1138   CREATININE 1.41 (H) 06/18/2023 1138   CREATININE 1.08 (H) 09/29/2022 1440   GLU 103 01/15/2018 0000      Component Value Date/Time   CALCIUM 9.6 06/18/2023 1138   ALKPHOS 48 06/04/2023 1503   AST 22 06/04/2023 1503   AST 29 09/29/2022 1440   ALT 17 06/04/2023 1503   ALT 18 09/29/2022 1440   BILITOT 1.2 06/04/2023 1503   BILITOT 1.4 (H) 09/29/2022 1440      SURGICAL PATHOLOGY   CASE: [id number]  PATIENT: Katie [name]  Bone Marrow Report   Reason for Addendum #1:  Molecular Genetic Test Results, FISH  Reason for Addendum #2:  Cytogenetics results   Clinical History: MGUS, left posterior iliac, (ADC)      DIAGNOSIS:   BONE MARROW, ASPIRATE, CLOT, CORE:  -  Normocellular bone marrow with trilineage hematopoiesis and small  lambda-restricted plasma cell population  -  See comment   PERIPHERAL BLOOD:  -  Normocytic anemia  -  See CBC data    COMMENT:   CD138 immunohistochemistry on the clot and core biopsy highlights a mild  increase in plasma cells (3-5%).  Kappa/lambda light chain in situ  hybridization highlights a patchy expansion of lambda-restricted plasma  cells with aberrant CD56 expression by immunohistochemistry in a  background of polytypic plasma  cells.  Correlation with clinical  findings, radiographic studies, other laboratory data, and  cytogenetics/FISH results is recommended  FISH results normal, no evidence of abnormalities  Cytogenetics normal.  Labs from 09/28/2021 showed a white blood cell count of 3.9, hemoglobin of 13.7 hematocrit of 40.4 and platelet count of 174,000  SPEP from 09/28/2021 with M spike of 0.6 g/dL.  Immunofixation showed IgG monoclonal protein with lambda light chain specificity Kappa lambda ratio normal at 0.40  CMP showed no evidence of hypercalcemia or acute kidney injury or elevated total protein  RADIOGRAPHIC STUDIES: I have personally reviewed the radiological images as listed and agreed with the findings in the report.  Total time spent: 20 minutes including history, physical exam, review of records, counseling or coordination of care  No results found.    Rachel Moulds, MD 07/09/2023 11:59 AM

## 2023-07-10 DIAGNOSIS — R82998 Other abnormal findings in urine: Secondary | ICD-10-CM | POA: Diagnosis not present

## 2023-07-10 LAB — KAPPA/LAMBDA LIGHT CHAINS
Kappa free light chain: 28.1 mg/L — ABNORMAL HIGH (ref 3.3–19.4)
Kappa, lambda light chain ratio: 0.34 (ref 0.26–1.65)
Lambda free light chains: 83.3 mg/L — ABNORMAL HIGH (ref 5.7–26.3)

## 2023-07-10 LAB — IGG, IGA, IGM
IgA: 214 mg/dL (ref 64–422)
IgG (Immunoglobin G), Serum: 1247 mg/dL (ref 586–1602)
IgM (Immunoglobulin M), Srm: 172 mg/dL (ref 26–217)

## 2023-07-17 DIAGNOSIS — E875 Hyperkalemia: Secondary | ICD-10-CM | POA: Diagnosis not present

## 2023-07-18 LAB — BASIC METABOLIC PANEL
BUN/Creatinine Ratio: 12 (ref 12–28)
BUN: 14 mg/dL (ref 8–27)
CO2: 23 mmol/L (ref 20–29)
Calcium: 10 mg/dL (ref 8.7–10.3)
Chloride: 104 mmol/L (ref 96–106)
Creatinine, Ser: 1.2 mg/dL — ABNORMAL HIGH (ref 0.57–1.00)
Glucose: 106 mg/dL — ABNORMAL HIGH (ref 70–99)
Potassium: 4.7 mmol/L (ref 3.5–5.2)
Sodium: 141 mmol/L (ref 134–144)
eGFR: 46 mL/min/{1.73_m2} — ABNORMAL LOW (ref >59)

## 2023-07-18 LAB — IMMUNOFIXATION REFLEX, SERUM
IgA: 227 mg/dL (ref 64–422)
IgG (Immunoglobin G), Serum: 1388 mg/dL (ref 586–1602)
IgM (Immunoglobulin M), Srm: 183 mg/dL (ref 26–217)

## 2023-07-18 LAB — PROTEIN ELECTROPHORESIS, SERUM, WITH REFLEX
A/G Ratio: 1.1 (ref 0.7–1.7)
Albumin ELP: 3.5 g/dL (ref 2.9–4.4)
Alpha-1-Globulin: 0.2 g/dL (ref 0.0–0.4)
Alpha-2-Globulin: 0.7 g/dL (ref 0.4–1.0)
Beta Globulin: 0.9 g/dL (ref 0.7–1.3)
Gamma Globulin: 1.2 g/dL (ref 0.4–1.8)
Globulin, Total: 3.1 g/dL (ref 2.2–3.9)
M-Spike, %: 0.5 g/dL — ABNORMAL HIGH
SPEP Interpretation: 0
Total Protein ELP: 6.6 g/dL (ref 6.0–8.5)

## 2023-07-23 ENCOUNTER — Ambulatory Visit: Payer: Self-pay | Admitting: Licensed Clinical Social Worker

## 2023-07-23 NOTE — Patient Outreach (Signed)
 Care Coordination   Follow Up Visit Note   07/23/2023 Name: Katie [name] MRN: [id number] DOB: 1945-08-21  Katie [name] is a 78 y.o. year old female who sees Katie Grandchild, MD for primary care. I spoke with  Katie [name] by phone today.  What matters to the patients health and wellness today?  LCSW A. Katie [name] and Pt Katie [name]     Goals Addressed             This Visit's Progress    Care Coordination       Activities and task to complete in order to accomplish goals.   You have decided not to move forward with counseling and would like to work with me on brief coping skills to assist you with managing anxiety and grief Start / continue relaxed breathing 3 times daily Continue with compliance of taking medication prescribed by Doctor Self Support options  (when ready consider engaging in [place])  Reviewed goals with Katie [name] 07/23/2023        SDOH assessments and interventions completed:  Yes  SDOH Interventions Today    Flowsheet Row Most Recent Value  SDOH Interventions   Food Insecurity Interventions Intervention Not Indicated  Housing Interventions Intervention Not Indicated  Transportation Interventions Intervention Not Indicated  Utilities Interventions Intervention Not Indicated        Care Coordination Interventions:  Yes, provided  Interventions Today    Flowsheet Row Most Recent Value  Mental Health Interventions   Mental Health Discussed/Reviewed Mental Health Discussed, Mental Health Reviewed, Grief and Loss  [Pt Katie [name] and LCSW A. Felton Clinton processed emotions associated w/ grieving loss of sister. Identifying emotional and cognitive triggers that promote anxious emotions.]       Follow up plan: Follow up call scheduled for 08/06/2023    Encounter Outcome:  Patient Visit Completed   Gwyndolyn Saxon MSW, LCSW Licensed Clinical Social Worker  [facility], Population Health Direct Dial: [phone]  Fax: [phone]

## 2023-07-23 NOTE — Patient Instructions (Signed)
 Visit Information  Thank you for taking time to visit with me today. Please don't hesitate to contact me if I can be of assistance to you.   Following are the goals we discussed today:   Goals Addressed             This Visit's Progress    Care Coordination       Activities and task to complete in order to accomplish goals.   You have decided not to move forward with counseling and would like to work with me on brief coping skills to assist you with managing anxiety and grief Start / continue relaxed breathing 3 times daily Continue with compliance of taking medication prescribed by Doctor Self Support options  (when ready consider engaging in [place])  Reviewed goals with Mrs. Lukasiewicz 07/23/2023        Our next appointment is by telephone on 08/06/2023 at 2pm  Please call the care guide team at [phone] if you need to cancel or reschedule your appointment.   If you are experiencing a Mental Health or Behavioral Health Crisis or need someone to talk to, please call 911   Patient verbalizes understanding of instructions and care plan provided today and agrees to view in MyChart. Active MyChart status and patient understanding of how to access instructions and care plan via MyChart confirmed with patient.     Telephone follow up appointment with care management team member scheduled for:08/06/2023  Gwyndolyn Saxon MSW, LCSW Licensed Clinical Social Worker  [facility], Population Health Direct Dial: [phone]  Fax: [phone]

## 2023-07-25 ENCOUNTER — Ambulatory Visit: Payer: Medicare Other | Attending: Cardiology | Admitting: Pharmacist

## 2023-07-25 ENCOUNTER — Encounter: Payer: Self-pay | Admitting: Pharmacist

## 2023-07-25 VITALS — BP 122/84 | HR 84

## 2023-07-25 DIAGNOSIS — I10 Essential (primary) hypertension: Secondary | ICD-10-CM

## 2023-07-25 MED ORDER — CARVEDILOL 6.25 MG PO TABS: 6.2500 mg | ORAL_TABLET | Freq: Two times a day (BID) | ORAL | 3 refills | Status: DC

## 2023-07-25 NOTE — Assessment & Plan Note (Signed)
 Assessment: BP is controlled in office BP 122/84 mmHg heart rate 84 (goal<130/80). Last EF 20-25% on 04/26/23.  Tolerates all HF/BP medications well without any side effects Denies SOB, palpitation, chest pain, headaches,or swelling Able to perform ADLs. Denies  LEE, PND, or orthopnea. Appetite has been less Go to gym 3 times per week ( 30 min cardio + 20 min resistance) follows low salt diet   Plan:  Increase carvedilol dose from 3.125 mg twice daily to 6.25 mg twice daily, stop taking hydralazine 25 mg twice daily   Continue taking Jardiance 10 mg daily,  losartan 25 mg daily, spironolactone 25 mg every morning   Patient to keep record of BP readings with heart rate and report to [name] at the next visit Future plan to switch [facility] to [facility] ( enrolled in the grant for Jardiance)  Patient to see PharmD in 6-8 weeks for follow up  Follow up lab(s) :none

## 2023-07-25 NOTE — Progress Notes (Signed)
 Patient ID: Katie [name]                 DOB: [birth year]                      MRN: [id number]     HPI: Katie [name] is a 78 y.o. female referred by Dr. Odis Hollingshead to pharmacy clinic for HF medication management. PMH is significant for Cardiomyopathy, History of DVT, GERD, hypertension chronic kidney disease stage, acquired hypothyroidism, hyperlipidemia, prediabetes, advanced age . Most recent LVEF 20-25% on 04/26/23. Echo 1/22 EF 59% Lexiscan  2/22: EF 42% normal perfusion Echo 3/23 EF 45-50% Echo 9/24 EF 20-25%    Patient is being followed by the practice given her valvular heart disease.  Serial echocardiograms started to illustrate reduction in LVEF.  Given the cardiomyopathy she was recommended to undergo ischemic workup.  However since she was asymptomatic she did not want to undergo MPI or coronary CTA in the past.   Since her last office visit patient was seen in October 2024 by Jari Favre -during which time cardiac MRI was ordered and she was referred to advanced heart failure to establish care.  GDMT was uptitrated.  Of note Sherryll Burger was not refilled as it was cost prohibitive.   She saw Dr. Nicholes Mango in October 2024 as well as January 2025.  Cardiac MRI was performed in the interim which noted moderately reduced LVEF results noted below for further reference.  It was recommended that she continue with GDMT to maximally tolerated doses.  At last visit with Dr.Tolia spironolactone dose were increased to 25 mg daily and hydralazine dose were reduced from 25 mg three times daily to 25 mg twice daily. F/u BMP K level was elevated Lokelma was prescribed and k level returned to normal (suspected lab error since pt's K level historically always WNL). Enrolled in the grant for Jardiance.   Patient presented today in good spirit. Reports she does not check her BP at home every day but home BP ~  127-135/72-85. She tolerates medication changes that were implemented last time by Dr.Tolia. denies  dizziness, lightheadedness,  fatigue, SOB, chest pain or palpitations. Follow low salt diet and goes to gym 3 times per week. Able to perform ADLs. Denies  LEE, PND, or orthopnea. Appetite has been less. She lost her husband and her sister last year and since then her health is declining. She suffers from anxiety now. She takes PRN alprazolam but try not to get dependent on. Getting help from counselor and have good friend support.    Current CHF meds: Jardiance 10 mg daily, hydralazine 25 mg twice daily, carvedilol 3.125 mg twice daily, losartan 25 mg daily, spironolactone 25 mg every morning   Previously tried:  Adherence Assessment  Do you ever forget to take your medication? Uses pill box  [] Yes [x] No  Do you ever skip doses due to side effects? [] Yes [x] No  Do you have trouble affording your medicines? [] Yes [x] No  Are you ever unable to pick up your medication due to transportation difficulties? [] Yes [x] No  Do you ever stop taking your medications because you don't believe they are helping? [] Yes [x] No  Do you check your weight daily? [] Yes [x] No   Adherence strategy: pill box   Barriers to obtaining medications: none - enrolled in the grant for brand name medications   BP goal: <130/80   Family History: Relation Problem Comments  Mother (Deceased at age 47) Deep vein thrombosis  Diabetes retinopathy/blind  Hypertension     Father (Deceased at age 69) Prostate cancer      Social History:  Alcohol: none  Smoking: never   Diet: low salt diet. Does not add slat to her home cooked meals only eats out occasionally.   Exercise: 30 min walks and some resistance at gym -3 times per week   Home BP readings: recalls from memory 127-135/72-85  Wt Readings from Last 3 Encounters:  07/09/23 155 lb 4.8 oz (70.4 kg)  06/04/23 158 lb (71.7 kg)  06/04/23 157 lb 12.8 oz (71.6 kg)   BP Readings from Last 3 Encounters:  07/25/23 122/84  07/09/23 (!) 141/64  06/04/23 120/82    Pulse Readings from Last 3 Encounters:  07/25/23 84  07/09/23 89  06/04/23 79    Renal function: CrCl cannot be calculated (Unknown ideal weight.).  Past Medical History:  Diagnosis Date   Acute venous embolism and thrombosis of unspecified deep vessels of lower extremity 2001   RLE DVT and PE, coumadin x 81mo, now ASSA   Allergy    Anxiety state, unspecified    Blood transfusion without reported diagnosis    Cataract    Chronic idiopathic constipation 11/02/2016   Clotting disorder (HCC)    Degeneration of cervical intervertebral disc    Gallstones    History of DVT (deep vein thrombosis)    Iron deficiency anemia secondary to blood loss (chronic)    Irritable bowel syndrome    Lumbago    Other B-complex deficiencies    Personal history of colonic polyps    adenomatous   Pure hypercholesterolemia    Reflux esophagitis    Tubular adenoma of colon 09/2002   Type II or unspecified type diabetes mellitus without mention of complication, not stated as uncontrolled    Unspecified essential hypertension    Unspecified hypothyroidism    Unspecified iridocyclitis    Unspecified venous (peripheral) insufficiency    Unspecified vitamin D deficiency     Current Outpatient Medications on File Prior to Visit  Medication Sig Dispense Refill   ALPRAZolam (XANAX) 0.5 MG tablet TAKE 1 TABLET BY MOUTH THREE TIMES DAILY AS NEEDED FOR ANXIETY OR SLEEP 270 tablet 0   aspirin 81 MG chewable tablet Chew 81 mg by mouth every morning.     Cholecalciferol 50 MCG (2000 UT) TABS Take 1 tablet (2,000 Units total) by mouth daily. 90 tablet 1   cyanocobalamin (VITAMIN B12) 1000 MCG/ML injection INJECT 1 ML INTO THE MUSCLE EVERY MONTH 3 mL 0   Dulaglutide (TRULICITY) 1.5 MG/0.5ML SOPN Inject 1.5 mg as directed once a week.     empagliflozin (JARDIANCE) 10 MG TABS tablet Take 1 tablet (10 mg total) by mouth in the morning.     levothyroxine (SYNTHROID) 88 MCG tablet Take 88 mcg by mouth daily  before breakfast.     losartan (COZAAR) 25 MG tablet Take 1 tablet (25 mg total) by mouth every evening.     magnesium hydroxide (DULCOLAX) 400 MG/5ML suspension Take 15 mLs by mouth daily as needed for mild constipation.     mirtazapine (REMERON SOL-TAB) 15 MG disintegrating tablet Take 15 mg by mouth at bedtime as needed.     Omega-3 Fatty Acids (FISH OIL PO) Take 1,400 mg by mouth daily.     Polyethyl Glycol-Propyl Glycol (SYSTANE) 0.4-0.3 % SOLN Place 1 drop into both eyes daily as needed (dry eyes).     rosuvastatin (CRESTOR) 10 MG tablet TAKE 1 TABLET BY MOUTH  IN THE  MORNING 90 tablet 0   spironolactone (ALDACTONE) 25 MG tablet Take 1 tablet (25 mg total) by mouth in the morning. 90 tablet 3   famotidine (PEPCID) 20 MG tablet Take 2 tablets by mouth once daily (Patient taking differently: Take 20 mg by mouth daily.) 180 tablet 0   hydrALAZINE (APRESOLINE) 25 MG tablet Take 1 tablet (25 mg total) by mouth in the morning and at bedtime. 180 tablet 3   sodium zirconium cyclosilicate (LOKELMA) 10 g PACK packet Take 10 gram po daily 30 packet 0   No current facility-administered medications on file prior to visit.    Allergies  Allergen Reactions   Lisinopril Other (See Comments) and Cough    Cough and throat clearing   Metoprolol Succinate Other (See Comments)    Unknown reaction   Penicillins Swelling    Has patient had a PCN reaction causing immediate rash, facial/tongue/throat swelling, SOB or lightheadedness with hypotension: Yes Has patient had a PCN reaction causing severe rash involving mucus membranes or skin necrosis: No Has patient had a PCN reaction that required hospitalization: No Has patient had a PCN reaction occurring within the last 10 years: No If all of the above answers are "NO", then may proceed with Cephalosporin use.    Sulfamethoxazole Other (See Comments)    Unknown reaction   Sulfamethoxazole-Trimethoprim Other (See Comments)    Unknown reaction    Trimethoprim Other (See Comments)    Unknown reaction   Metformin Nausea Only     Assessment/Plan:  1. CHF -  Essential hypertension Assessment: BP is controlled in office BP 122/84 mmHg heart rate 84 (goal<130/80). Last EF 20-25% on 04/26/23.  Tolerates all HF/BP medications well without any side effects Denies SOB, palpitation, chest pain, headaches,or swelling Able to perform ADLs. Denies  LEE, PND, or orthopnea. Appetite has been less Go to gym 3 times per week ( 30 min cardio + 20 min resistance) follows low salt diet   Plan:  Increase carvedilol dose from 3.125 mg twice daily to 6.25 mg twice daily, stop taking hydralazine 25 mg twice daily   Continue taking Jardiance 10 mg daily,  losartan 25 mg daily, spironolactone 25 mg every morning   Patient to keep record of BP readings with heart rate and report to [name] at the next visit Future plan to switch ARB to [facility] ( enrolled in the grant for Jardiance)  Patient to see PharmD in 6-8 weeks for follow up  Follow up lab(s) :none   Essential hypertension Overview: Estimated Creatinine Clearance: 40.9 mL/min (A) (by C-G formula based on SCr of 1.3 mg/dL (A)).  Estimated Creatinine Clearance: 41 mL/min (by C-G formula based on SCr of 1.15 mg/dL).   Assessment & Plan: Assessment: BP is controlled in office BP 122/84 mmHg heart rate 84 (goal<130/80). Last EF 20-25% on 04/26/23.  Tolerates all HF/BP medications well without any side effects Denies SOB, palpitation, chest pain, headaches,or swelling Able to perform ADLs. Denies  LEE, PND, or orthopnea. Appetite has been less Go to gym 3 times per week ( 30 min cardio + 20 min resistance) follows low salt diet   Plan:  Increase carvedilol dose from 3.125 mg twice daily to 6.25 mg twice daily, stop taking hydralazine 25 mg twice daily   Continue taking Jardiance 10 mg daily,  losartan 25 mg daily, spironolactone 25 mg every morning   Patient to keep record of BP readings with heart  rate and report to [name] at the  next visit Future plan to switch ARB to [facility] ( enrolled in the grant for Jardiance)  Patient to see PharmD in 6-8 weeks for follow up  Follow up lab(s) :none     Other orders -     Carvedilol; Take 1 tablet (6.25 mg total) by mouth 2 (two) times daily.  Dispense: 180 tablet; Refill: 3       Thank you   Carmela Hurt, Pharm.D [facility] A Division of [facility] [address] [place], [place] [address]  Phone: [phone]; Fax: [phone]

## 2023-07-25 NOTE — Patient Instructions (Addendum)
 Changes made by your pharmacist Carmela Hurt, PharmD at today's visit:    Instructions/Changes  (what do you need to do) Your Notes  (what you did and when you did it)  Increase carvedilol dose from 3.125 mg twice daily to 6.25 mg twice daily, stop taking hydralazine 25 mg twice daily     Continue taking Jardiance 10 mg daily,  losartan 25 mg daily, spironolactone 25 mg every morning     Continue doing exercise and follow low salt diet     Bring all of your meds, your BP cuff and your record of home blood pressures to your next appointment.    HOW TO TAKE YOUR BLOOD PRESSURE AT HOME  Rest 5 minutes before taking your blood pressure.  Don't smoke or drink caffeinated beverages for at least 30 minutes before. Take your blood pressure before (not after) you eat. Sit comfortably with your back supported and both feet on the floor (don't cross your legs). Elevate your arm to heart level on a table or a desk. Use the proper sized cuff. It should fit smoothly and snugly around your bare upper arm. There should be enough room to slip a fingertip under the cuff. The bottom edge of the cuff should be 1 inch above the crease of the elbow. Ideally, take 3 measurements at one sitting and record the average.  Important lifestyle changes to control high blood pressure  Intervention  Effect on the BP  Lose extra pounds and watch your waistline Weight loss is one of the most effective lifestyle changes for controlling blood pressure. If you're overweight or obese, losing even a small amount of weight can help reduce blood pressure. Blood pressure might go down by about 1 millimeter of mercury (mm Hg) with each kilogram (about 2.2 pounds) of weight lost.  Exercise regularly As a general goal, aim for at least 30 minutes of moderate physical activity every day. Regular physical activity can lower high blood pressure by about 5 to 8 mm Hg.  Eat a healthy diet Eating a diet rich in whole grains, fruits,  vegetables, and low-fat dairy products and low in saturated fat and cholesterol. A healthy diet can lower high blood pressure by up to 11 mm Hg.  Reduce salt (sodium) in your diet Even a small reduction of sodium in the diet can improve heart health and reduce high blood pressure by about 5 to 6 mm Hg.  Limit alcohol One drink equals 12 ounces of beer, 5 ounces of wine, or 1.5 ounces of 80-proof liquor.  Limiting alcohol to less than one drink a day for women or two drinks a day for men can help lower blood pressure by about 4 mm Hg.   If you have any questions or concerns please use My Chart to send questions or call the office at [phone]

## 2023-07-31 ENCOUNTER — Telehealth: Payer: Self-pay | Admitting: Cardiology

## 2023-07-31 NOTE — Telephone Encounter (Signed)
 Pt c/o medication issue:  1. Name of Medication:   All morning medication  2. How are you currently taking this medication (dosage and times per day)?   As prescribed  3. Are you having a reaction (difficulty breathing--STAT)?   4. What is your medication issue?    Patient stated that at her last visit with Dr. Allena Katz she was told she could take all of her morning medications at the same time but patient has noticed she gets hoarse when she takes all of her medications together.  Patient wants advice on next steps and noted she has an appointment at 1:45-2:30 pm.

## 2023-08-01 NOTE — Telephone Encounter (Signed)
 Spoke to pt, addressed concern. She may be having some post nasal drip from spring season that could be leading to hoarseness of voice. She denies any SOB, swelling or orthopnea, PND.  She will continue taking current medications.

## 2023-08-04 ENCOUNTER — Other Ambulatory Visit: Payer: Self-pay | Admitting: Internal Medicine

## 2023-08-04 DIAGNOSIS — E538 Deficiency of other specified B group vitamins: Secondary | ICD-10-CM

## 2023-08-06 ENCOUNTER — Other Ambulatory Visit: Payer: Self-pay | Admitting: Internal Medicine

## 2023-08-06 ENCOUNTER — Ambulatory Visit: Payer: Self-pay | Admitting: Licensed Clinical Social Worker

## 2023-08-06 DIAGNOSIS — E785 Hyperlipidemia, unspecified: Secondary | ICD-10-CM

## 2023-08-06 NOTE — Patient Instructions (Signed)
 Visit Information  Thank you for taking time to visit with me today. Please don't hesitate to contact me if I can be of assistance to you.   Following are the goals we discussed today:   Goals Addressed   None     Our next appointment is by telephone on 08/20/2023 at 2pm  Please call the care guide team at [phone] if you need to cancel or reschedule your appointment.   If you are experiencing a Mental Health or Behavioral Health Crisis or need someone to talk to, please call 911   Patient verbalizes understanding of instructions and care plan provided today and agrees to view in MyChart. Active MyChart status and patient understanding of how to access instructions and care plan via MyChart confirmed with patient.     Telephone follow up appointment with care management team member scheduled for:08/20/2023  Gwyndolyn Saxon MSW, LCSW Licensed Clinical Social Worker  [facility], Population Health Direct Dial: [phone]  Fax: [phone]

## 2023-08-06 NOTE — Patient Outreach (Signed)
 Care Coordination   Follow Up Visit Note   08/06/2023 Name: Marianela Mandrell MRN: [id number] DOB: 11-26-1945  Candra Wegner is a 78 y.o. year old female who sees Etta Grandchild, MD for primary care. I spoke with  [name] by phone today.  What matters to the patients health and wellness today?  Processing thoughts and emotions of grief    Goals Addressed   None     SDOH assessments and interventions completed:  Yes  SDOH Interventions Today    Flowsheet Row Most Recent Value  SDOH Interventions   Food Insecurity Interventions Intervention Not Indicated  Housing Interventions Intervention Not Indicated  Utilities Interventions Intervention Not Indicated        Care Coordination Interventions:  Yes, provided  Interventions Today    Flowsheet Row Most Recent Value  Mental Health Interventions   Mental Health Discussed/Reviewed Mental Health Reviewed, Grief and Loss, Coping Strategies       Follow up plan: Follow up call scheduled for 08/20/2023    Encounter Outcome:  Patient Visit Completed   Gwyndolyn Saxon MSW, LCSW Licensed Clinical Social Worker  [facility], Population Health Direct Dial: [phone]  Fax: [phone]

## 2023-08-08 DIAGNOSIS — I129 Hypertensive chronic kidney disease with stage 1 through stage 4 chronic kidney disease, or unspecified chronic kidney disease: Secondary | ICD-10-CM | POA: Diagnosis not present

## 2023-08-08 DIAGNOSIS — E1122 Type 2 diabetes mellitus with diabetic chronic kidney disease: Secondary | ICD-10-CM | POA: Diagnosis not present

## 2023-08-08 DIAGNOSIS — I5020 Unspecified systolic (congestive) heart failure: Secondary | ICD-10-CM | POA: Diagnosis not present

## 2023-08-08 DIAGNOSIS — N1832 Chronic kidney disease, stage 3b: Secondary | ICD-10-CM | POA: Diagnosis not present

## 2023-08-08 DIAGNOSIS — D472 Monoclonal gammopathy: Secondary | ICD-10-CM | POA: Diagnosis not present

## 2023-08-08 DIAGNOSIS — N2581 Secondary hyperparathyroidism of renal origin: Secondary | ICD-10-CM | POA: Diagnosis not present

## 2023-08-09 LAB — LAB REPORT - SCANNED
Albumin, Urine POC: 8.9
Albumin/Creatinine Ratio, Urine, POC: 9
Creatinine, POC: 94.3 mg/dL
EGFR: 48

## 2023-08-13 ENCOUNTER — Encounter: Payer: Self-pay | Admitting: Cardiology

## 2023-08-20 ENCOUNTER — Ambulatory Visit: Payer: Self-pay | Admitting: Licensed Clinical Social Worker

## 2023-08-20 NOTE — Patient Outreach (Signed)
 Complex Care Management   Visit Note  08/20/2023  Name:  Katie [name] MRN: [id number] DOB: [birth year]  Situation: Referral received for Complex Care Management related to SDOH Barriers:  anxiety  I obtained verbal consent from [facility] .  Visit completed with [name]   on the phone  Background:   Past Medical History:  Diagnosis Date   Acute venous embolism and thrombosis of unspecified deep vessels of lower extremity 2001   RLE DVT and PE, coumadin x 56mo, now ASSA   Allergy    Anxiety state, unspecified    Blood transfusion without reported diagnosis    Cataract    Chronic idiopathic constipation 11/02/2016   Clotting disorder (HCC)    Degeneration of cervical intervertebral disc    Gallstones    History of DVT (deep vein thrombosis)    Iron deficiency anemia secondary to blood loss (chronic)    Irritable bowel syndrome    Lumbago    Other B-complex deficiencies    Personal history of colonic polyps    adenomatous   Pure hypercholesterolemia    Reflux esophagitis    Tubular adenoma of colon 09/2002   Type II or unspecified type diabetes mellitus without mention of complication, not stated as uncontrolled    Unspecified essential hypertension    Unspecified hypothyroidism    Unspecified iridocyclitis    Unspecified venous (peripheral) insufficiency    Unspecified vitamin D deficiency     Assessment: Patient Reported Symptoms:  Cognitive    Neurological      HEENT      Cardiovascular      Respiratory      Endocrine      Gastrointestinal      Genitourinary      Integumentary      Musculoskeletal      Psychosocial Anxiety - if selected complete GAD     There were no vitals filed for this visit.  Medications Reviewed Today   Medications were not reviewed in this encounter     Recommendation:   Ms.Dueitt engage in grief share or another grief support group of her choice.   Follow Up Plan:   Telephone follow-up 3 week follow up via phone  Gwyndolyn Saxon MSW, LCSW Licensed Clinical Social Worker  [facility], Population Health Direct Dial: [phone]  Fax: [phone]

## 2023-08-20 NOTE — Patient Instructions (Signed)
 Visit Information  Thank you for taking time to visit with me today. Please don't hesitate to contact me if I can be of assistance to you.   Following are the goals we discussed today:   Goals Addressed             This Visit's Progress    Care Coordination       Activities and task to complete in order to accomplish goals.   You have decided not to move forward with counseling and would like to work with me on brief coping skills to assist you with managing anxiety and grief Start / continue relaxed breathing 3 times daily Continue with compliance of taking medication prescribed by Doctor Self Support options  (when ready consider engaging in [place])  Reviewed goals with Mrs. Thaker 08/20/2023        Our next appointment is by telephone on 09/10/2023 at 2pm  Please call the care guide team at [phone] if you need to cancel or reschedule your appointment.   If you are experiencing a Mental Health or Behavioral Health Crisis or need someone to talk to, please call 911   Patient verbalizes understanding of instructions and care plan provided today and agrees to view in MyChart. Active MyChart status and patient understanding of how to access instructions and care plan via MyChart confirmed with patient.     Telephone follow up appointment with care management team member scheduled for:09/10/2023  Gwyndolyn Saxon MSW, LCSW Licensed Clinical Social Worker  [facility], Population Health Direct Dial: [phone]  Fax: [phone]

## 2023-08-22 DIAGNOSIS — L720 Epidermal cyst: Secondary | ICD-10-CM | POA: Diagnosis not present

## 2023-08-27 DIAGNOSIS — H00024 Hordeolum internum left upper eyelid: Secondary | ICD-10-CM | POA: Diagnosis not present

## 2023-08-27 DIAGNOSIS — H04123 Dry eye syndrome of bilateral lacrimal glands: Secondary | ICD-10-CM | POA: Diagnosis not present

## 2023-08-27 DIAGNOSIS — H02881 Meibomian gland dysfunction right upper eyelid: Secondary | ICD-10-CM | POA: Diagnosis not present

## 2023-08-27 DIAGNOSIS — H02884 Meibomian gland dysfunction left upper eyelid: Secondary | ICD-10-CM | POA: Diagnosis not present

## 2023-09-05 ENCOUNTER — Encounter: Payer: Self-pay | Admitting: Internal Medicine

## 2023-09-05 ENCOUNTER — Ambulatory Visit: Payer: Medicare Other | Admitting: Internal Medicine

## 2023-09-05 VITALS — BP 126/82 | HR 54 | Temp 97.3°F | Resp 16 | Ht 65.0 in | Wt 152.8 lb

## 2023-09-05 DIAGNOSIS — I10 Essential (primary) hypertension: Secondary | ICD-10-CM

## 2023-09-05 DIAGNOSIS — E538 Deficiency of other specified B group vitamins: Secondary | ICD-10-CM

## 2023-09-05 DIAGNOSIS — E785 Hyperlipidemia, unspecified: Secondary | ICD-10-CM

## 2023-09-05 DIAGNOSIS — E039 Hypothyroidism, unspecified: Secondary | ICD-10-CM | POA: Diagnosis not present

## 2023-09-05 DIAGNOSIS — Z0001 Encounter for general adult medical examination with abnormal findings: Secondary | ICD-10-CM

## 2023-09-05 DIAGNOSIS — Z0000 Encounter for general adult medical examination without abnormal findings: Secondary | ICD-10-CM | POA: Diagnosis not present

## 2023-09-05 LAB — LIPID PANEL
Cholesterol: 103 mg/dL (ref 0–200)
HDL: 65.1 mg/dL (ref >39.00)
LDL Cholesterol: 26 mg/dL (ref 0–99)
NonHDL: 37.49
Total CHOL/HDL Ratio: 2
Triglycerides: 55 mg/dL (ref 0.0–149.0)
VLDL: 11 mg/dL (ref 0.0–40.0)

## 2023-09-05 LAB — TSH: TSH: 0.81 u[IU]/mL (ref 0.35–5.50)

## 2023-09-05 LAB — FOLATE: Folate: 21.4 ng/mL (ref >5.9)

## 2023-09-05 NOTE — Patient Instructions (Signed)

## 2023-09-05 NOTE — Progress Notes (Signed)
 Subjective:  Patient ID: Katie [name], female    DOB: [birth year]  Age: 78 y.o. MRN: [id number]  CC: Annual Exam and Hypertension   HPI Liliannah Langevin presents for a CPX and f/up ---  Discussed the use of AI scribe software for clinical note transcription with the patient, who gave verbal consent to proceed.  History of Present Illness   She is a 78 year old female who presents with anxiety and insomnia following the recent passing of her brother and sister.  She experiences significant anxiety and insomnia, attributing these symptoms to the recent passing of her brother, Rich Champ, and her sister, which occurred within four months of each other. She feels overwhelmed at times, with anxiety intensifying when she is alone, particularly at home. She attends therapy sessions, finding them helpful, but feels the need for more frequent sessions.  She is currently taking Xanax  for anxiety, using it up to three times a day, and mirtazapine  at bedtime. While mirtazapine  aids her sleep, she still wakes up throughout the night, sometimes needing to use the restroom and then struggling to fall back asleep. She is uncertain if her sleep disturbances are due to insomnia or anxiety.  She mentions experiencing hoarseness in her throat and recalls a previous mention of hyperparathyroidism by another doctor, though she is uncertain if this is related to her symptoms. She also reports a lack of appetite, stating she is not hungry but knows she needs to eat.  She has a history of lower back pain attributed to degenerative disc disease and arthritis, for which she received an epidural injection last year. Despite the injection, she continues to experience lower back pain but does not take any medication for it, such as Tylenol  or ibuprofen.  She notes feeling stressed and anxious, with a history of depression that has improved over time. She wants to become more physically active but has not yet started a routine. No  chest pain, shortness of breath, dizziness, or lightheadedness.       Outpatient Medications Prior to Visit  Medication Sig Dispense Refill   ALPRAZolam  (XANAX ) 0.5 MG tablet TAKE 1 TABLET BY MOUTH THREE TIMES DAILY AS NEEDED FOR ANXIETY OR SLEEP 270 tablet 0   aspirin  81 MG chewable tablet Chew 81 mg by mouth every morning.     carvedilol  (COREG ) 6.25 MG tablet Take 1 tablet (6.25 mg total) by mouth 2 (two) times daily. 180 tablet 3   Cholecalciferol  50 MCG (2000 UT) TABS Take 1 tablet (2,000 Units total) by mouth daily. 90 tablet 1   cyanocobalamin  (VITAMIN B12) 1000 MCG/ML injection INJECT 1 ML INTO THE MUSCLE ONCE EVERY MONTH 3 mL 0   empagliflozin  (JARDIANCE ) 10 MG TABS tablet Take 1 tablet (10 mg total) by mouth in the morning.     famotidine  (PEPCID ) 20 MG tablet Take 2 tablets by mouth once daily (Patient taking differently: Take 20 mg by mouth daily.) 180 tablet 0   levothyroxine (SYNTHROID) 88 MCG tablet Take 88 mcg by mouth daily before breakfast.     losartan  (COZAAR ) 25 MG tablet Take 1 tablet (25 mg total) by mouth every evening.     magnesium hydroxide (DULCOLAX) 400 MG/5ML suspension Take 15 mLs by mouth daily as needed for mild constipation.     MAXITROL 3.5-10000-0.1 OINT Place 1 Application into the left eye 3 (three) times daily.     mirtazapine  (REMERON  SOL-TAB) 15 MG disintegrating tablet Take 15 mg by mouth at bedtime as needed.  Omega-3 Fatty Acids (FISH OIL PO) Take 1,400 mg by mouth daily.     Polyethyl Glycol-Propyl Glycol (SYSTANE) 0.4-0.3 % SOLN Place 1 drop into both eyes daily as needed (dry eyes).     rosuvastatin  (CRESTOR ) 10 MG tablet TAKE 1 TABLET BY MOUTH IN THE  MORNING 90 tablet 3   spironolactone  (ALDACTONE ) 25 MG tablet Take 1 tablet (25 mg total) by mouth in the morning. 90 tablet 3   No facility-administered medications prior to visit.    ROS Review of Systems  Constitutional:  Negative for appetite change, chills, diaphoresis, fatigue and  fever.  HENT: Negative.    Eyes: Negative.  Negative for visual disturbance.  Respiratory: Negative.  Negative for cough, chest tightness, shortness of breath and wheezing.   Cardiovascular:  Negative for chest pain, palpitations and leg swelling.  Gastrointestinal: Negative.  Negative for abdominal pain, constipation, diarrhea, nausea and vomiting.  Endocrine: Negative.   Genitourinary: Negative.  Negative for difficulty urinating.  Musculoskeletal:  Positive for arthralgias and back pain.  Skin: Negative.   Neurological:  Negative for dizziness, weakness and light-headedness.  Hematological:  Negative for adenopathy. Does not bruise/bleed easily.  Psychiatric/Behavioral:  Positive for decreased concentration and sleep disturbance. Negative for confusion and hallucinations. The patient is nervous/anxious. The patient is not hyperactive.     Objective:  BP 126/82 (BP Location: Left Arm, Patient Position: Sitting, Cuff Size: Normal)   Pulse (!) 54   Temp (!) 97.3 F (36.3 C) (Temporal)   Resp 16   Ht 5\' 5"  (1.651 m)   Wt 152 lb 12.8 oz (69.3 kg)   SpO2 96% Comment: unable to get due to her nails.  BMI 25.43 kg/m   BP Readings from Last 3 Encounters:  09/05/23 126/82  07/25/23 122/84  07/09/23 (!) 141/64    Wt Readings from Last 3 Encounters:  09/05/23 152 lb 12.8 oz (69.3 kg)  07/09/23 155 lb 4.8 oz (70.4 kg)  06/04/23 158 lb (71.7 kg)    Physical Exam Vitals reviewed.  Constitutional:      Appearance: Normal appearance.  HENT:     Nose: Nose normal.     Mouth/Throat:     Mouth: Mucous membranes are moist.  Eyes:     General: No scleral icterus.    Conjunctiva/sclera: Conjunctivae normal.  Cardiovascular:     Rate and Rhythm: Regular rhythm. Bradycardia present.     Heart sounds: Normal heart sounds, S1 normal and S2 normal. No murmur heard.    Comments: EKG---  SB, 54 bpm Minimal LVH No Q waves TWI in III Unchanged  Pulmonary:     Effort: Pulmonary effort  is normal.     Breath sounds: No stridor. No wheezing, rhonchi or rales.  Abdominal:     General: Abdomen is flat.     Palpations: There is no mass.     Tenderness: There is no abdominal tenderness. There is no guarding.     Hernia: No hernia is present.  Musculoskeletal:     Cervical back: Neck supple.     Right lower leg: No edema.     Left lower leg: No edema.  Lymphadenopathy:     Cervical: No cervical adenopathy.  Skin:    General: Skin is warm and dry.     Findings: No rash.  Neurological:     General: No focal deficit present.     Mental Status: She is alert. Mental status is at baseline.  Psychiatric:  Mood and Affect: Mood normal.        Behavior: Behavior normal.     Lab Results  Component Value Date   WBC 4.8 07/09/2023   HGB 13.6 07/09/2023   HCT 38.6 07/09/2023   PLT 186 07/09/2023   GLUCOSE 106 (H) 07/17/2023   CHOL 103 09/05/2023   TRIG 55.0 09/05/2023   HDL 65.10 09/05/2023   LDLDIRECT 105.9 07/04/2007   LDLCALC 26 09/05/2023   ALT 16 07/09/2023   AST 23 07/09/2023   NA 141 07/17/2023   K 4.7 07/17/2023   CL 104 07/17/2023   CREATININE 1.20 (H) 07/17/2023   BUN 14 07/17/2023   CO2 23 07/17/2023   TSH 0.81 09/05/2023   HGBA1C 5.6 06/04/2023   MICROALBUR 1.6 06/04/2023    No results found.  Assessment & Plan:  Essential hypertension- Her BP is well controlled. -     EKG 12-Lead  Hyperlipidemia with target LDL less than 100- LDL goal achieved. Doing well on the statin  -     Lipid panel; Future -     TSH; Future  B12 deficiency -     Folate; Future  Encounter for general adult medical examination with abnormal findings- Exam completed, labs reviewed, vaccines reviewed and updated, cancer screenings addressed, pt ed material was given.   Acquired hypothyroidism- She is euthyroid. -     Lipid panel; Future -     TSH; Future     Follow-up: Return in about 6 months (around 03/06/2024).  Sandra Crouch, MD

## 2023-09-07 ENCOUNTER — Telehealth: Payer: Self-pay | Admitting: Internal Medicine

## 2023-09-07 NOTE — Telephone Encounter (Signed)
 Copied from CRM [id number]. Topic: Clinical - Lab/Test Results >> Sep 06, 2023  4:04 PM Adonis Hoot wrote: Reason for CRM: Patient would like her lab results from labs drawn on  09/05/2023 mailed to her home,along with any other information related to what Dr Rochelle Chu thinks is going on with her regarding stress and anxiety.

## 2023-09-14 ENCOUNTER — Other Ambulatory Visit: Payer: Self-pay | Admitting: Internal Medicine

## 2023-09-14 DIAGNOSIS — F411 Generalized anxiety disorder: Secondary | ICD-10-CM

## 2023-09-14 MED ORDER — TRAZODONE HCL 50 MG PO TABS: 50.0000 mg | ORAL_TABLET | Freq: Every day | ORAL | 0 refills | Status: DC

## 2023-09-14 NOTE — Telephone Encounter (Signed)
 Can you make comments on her labs so I can send them out.

## 2023-09-14 NOTE — Telephone Encounter (Signed)
 Labs were normal Letter was mailed An addition med for anxiety has been prescribed

## 2023-09-17 NOTE — Telephone Encounter (Signed)
 Patient has been made aware and gave a verbal understanding,.

## 2023-09-20 ENCOUNTER — Other Ambulatory Visit: Payer: Self-pay | Admitting: Internal Medicine

## 2023-09-20 DIAGNOSIS — L720 Epidermal cyst: Secondary | ICD-10-CM | POA: Diagnosis not present

## 2023-09-20 DIAGNOSIS — F411 Generalized anxiety disorder: Secondary | ICD-10-CM

## 2023-09-21 ENCOUNTER — Ambulatory Visit: Payer: Medicare Other | Admitting: Internal Medicine

## 2023-09-21 ENCOUNTER — Ambulatory Visit: Admitting: Pharmacist

## 2023-09-21 ENCOUNTER — Encounter: Payer: Self-pay | Admitting: Internal Medicine

## 2023-09-21 VITALS — BP 120/74 | HR 70 | Ht 65.0 in | Wt 154.0 lb

## 2023-09-21 DIAGNOSIS — N1831 Type 2 diabetes mellitus with diabetic chronic kidney disease: Secondary | ICD-10-CM | POA: Insufficient documentation

## 2023-09-21 DIAGNOSIS — E1122 Type 2 diabetes mellitus with diabetic chronic kidney disease: Secondary | ICD-10-CM | POA: Diagnosis not present

## 2023-09-21 DIAGNOSIS — E1142 Type 2 diabetes mellitus with diabetic polyneuropathy: Secondary | ICD-10-CM | POA: Insufficient documentation

## 2023-09-21 DIAGNOSIS — E039 Hypothyroidism, unspecified: Secondary | ICD-10-CM

## 2023-09-21 DIAGNOSIS — Z7984 Long term (current) use of oral hypoglycemic drugs: Secondary | ICD-10-CM

## 2023-09-21 LAB — POCT GLYCOSYLATED HEMOGLOBIN (HGB A1C): Hemoglobin A1C: 6 % — AB (ref 4.0–5.6)

## 2023-09-21 MED ORDER — LEVOTHYROXINE SODIUM 88 MCG PO TABS
88.0000 ug | ORAL_TABLET | Freq: Every day | ORAL | 3 refills | Status: DC
Start: 2023-09-21 — End: 2024-01-31

## 2023-09-21 MED ORDER — LEVOTHYROXINE SODIUM 88 MCG PO TABS: 88.0000 ug | ORAL_TABLET | Freq: Every day | ORAL | 3 refills | Status: DC

## 2023-09-21 NOTE — Patient Instructions (Signed)
 Continue Jardiance  10 mg, 1 tablet every morning  This medication is prescribed to you by the heart doctor but it also helps with your diabetes   You do not need any Trulicity at this time as your A1c is 6.0%

## 2023-09-21 NOTE — Progress Notes (Signed)
 Name: Katie [name]  MRN/ DOB: [id number], Jul 05, 1945   Age/ Sex: 78 y.o., female    PCP: Arcadio Knuckles, MD   Reason for Endocrinology Evaluation: Type 2 Diabetes Mellitus     Date of Initial Endocrinology Visit: 09/21/2023     PATIENT IDENTIFIER: Katie [name] is a 78 y.o. female with a past medical history of DM, HTN, hypothyroid. The patient presented for initial endocrinology clinic visit on 09/21/2023 for consultative assistance with her diabetes management.    HPI: Katie [name] was    Diagnosed with DM 2009 Prior Medications tried/Intolerance: Metformin- nausea. Trulicity- no interlance Currently checking blood sugars 1 x / day Hypoglycemia episodes : no                Hemoglobin A1c has ranged from 5.2% in 2019, peaking at 6.9% in 2009.   She was to follow-up with [facility]  She was evaluated by podiatry yesterday Denies nausea or vomiting  Denies constipation or diarrhea  Denies local neck swelling , but has hoarseness Denies palpitations  Has right hand tremors   She does follow-up with cardiology for CHF She also follows with [facility]  THYROID  HISTORY :  She has been on levothyroxine for many years.  No prior RAI or anterior neck surgery in the past    HOME DIABETES REGIMEN: Jardiance  10 mg, 1 tablet daily - pt assistance      Statin: yes ACE-I/ARB: yes   METER DOWNLOAD SUMMARY: Unable to download 102 - 138 mg/dL     DIABETIC COMPLICATIONS: Microvascular complications:  Polyneuropathy, CKD III Denies: DR Last eye exam: Completed 2025  Macrovascular complications:   Denies: CAD, PVD, CVA   PAST HISTORY: Past Medical History:  Past Medical History:  Diagnosis Date   Acute venous embolism and thrombosis of unspecified deep vessels of lower extremity 2001   RLE DVT and PE, coumadin x 28mo, now ASSA   Allergy    Anxiety state, unspecified    Blood transfusion without reported diagnosis    Cataract    Chronic idiopathic  constipation 11/02/2016   Clotting disorder (HCC)    Degeneration of cervical intervertebral disc    Gallstones    History of DVT (deep vein thrombosis)    Iron deficiency anemia secondary to blood loss (chronic)    Irritable bowel syndrome    Lumbago    Other B-complex deficiencies    Personal history of colonic polyps    adenomatous   Pure hypercholesterolemia    Reflux esophagitis    Tubular adenoma of colon 09/2002   Type II or unspecified type diabetes mellitus without mention of complication, not stated as uncontrolled    Unspecified essential hypertension    Unspecified hypothyroidism    Unspecified iridocyclitis    Unspecified venous (peripheral) insufficiency    Unspecified vitamin D  deficiency    Past Surgical History:  Past Surgical History:  Procedure Laterality Date   BIOPSY  04/24/2018   Procedure: BIOPSY;  Surgeon: Normie Becton., MD;  Location: [facility];  Service: Gastroenterology;;   Ritta Chessman RELEASE     CATARACT EXTRACTION Bilateral 08/29/2017,09/19/17   ESOPHAGOGASTRODUODENOSCOPY (EGD) WITH PROPOFOL  N/A 04/24/2018   Procedure: ESOPHAGOGASTRODUODENOSCOPY (EGD) WITH PROPOFOL ;  Surgeon: Normie Becton., MD;  Location: [facility];  Service: Gastroenterology;  Laterality: N/A;  with EMR   FEMORAL HERNIA REPAIR     OOPHORECTOMY     POLYPECTOMY  04/24/2018   Procedure: POLYPECTOMY;  Surgeon: Mansouraty, Albino Alu., MD;  Location: [facility];  Service: Gastroenterology;;   UPPER ESOPHAGEAL ENDOSCOPIC ULTRASOUND (EUS)  04/24/2018   Procedure: UPPER ESOPHAGEAL ENDOSCOPIC ULTRASOUND (EUS);  Surgeon: Brice Campi Albino Alu., MD;  Location: [facility];  Service: Gastroenterology;;   VESICOVAGINAL FISTULA CLOSURE W/ TAH      Social History:  reports that she has never smoked. She has never used smokeless tobacco. She reports current alcohol use. She reports that she does not use drugs. Family History:  Family History  Problem Relation Age  of Onset   Prostate cancer Father    Diabetes Mother        retinopathy/blind   Deep vein thrombosis Mother    Hypertension Mother    Healthy Sister    Colon cancer Neg Hx    Esophageal cancer Neg Hx    Stomach cancer Neg Hx    Rectal cancer Neg Hx      HOME MEDICATIONS: Allergies as of 09/21/2023       Reactions   Lisinopril  Other (See Comments), Cough   Cough and throat clearing   Metoprolol Succinate Other (See Comments)   Unknown reaction   Penicillins Swelling   Has patient had a PCN reaction causing immediate rash, facial/tongue/throat swelling, SOB or lightheadedness with hypotension: Yes Has patient had a PCN reaction causing severe rash involving mucus membranes or skin necrosis: No Has patient had a PCN reaction that required hospitalization: No Has patient had a PCN reaction occurring within the last 10 years: No If all of the above answers are "NO", then may proceed with Cephalosporin use.   Sulfamethoxazole Other (See Comments)   Unknown reaction   Sulfamethoxazole-trimethoprim Other (See Comments)   Unknown reaction   Trimethoprim Other (See Comments)   Unknown reaction   Metformin Nausea Only        Medication List        Accurate as of Sep 21, 2023 10:29 AM. If you have any questions, ask your nurse or doctor.          Accu-Chek Guide Test test strip Generic drug: glucose blood SMARTSIG:Strip(s)   ALPRAZolam  0.5 MG tablet Commonly known as: XANAX  TAKE 1 TABLET BY MOUTH THREE TIMES DAILY AS NEEDED FOR ANXIETY OR SLEEP   aspirin  81 MG chewable tablet Chew 81 mg by mouth every morning.   carvedilol  6.25 MG tablet Commonly known as: COREG  Take 1 tablet (6.25 mg total) by mouth 2 (two) times daily.   Cholecalciferol  50 MCG (2000 UT) Tabs Take 1 tablet (2,000 Units total) by mouth daily.   cyanocobalamin  1000 MCG/ML injection Commonly known as: VITAMIN B12 INJECT 1 ML INTO THE MUSCLE ONCE EVERY MONTH   Dulcolax 1200 MG/15ML  suspension Generic drug: magnesium hydroxide Take 15 mLs by mouth daily as needed for mild constipation.   empagliflozin  10 MG Tabs tablet Commonly known as: Jardiance  Take 1 tablet (10 mg total) by mouth in the morning.   famotidine  20 MG tablet Commonly known as: PEPCID  Take 2 tablets by mouth once daily What changed: how much to take   FISH OIL PO Take 1,400 mg by mouth daily.   levothyroxine 88 MCG tablet Commonly known as: SYNTHROID Take 88 mcg by mouth daily before breakfast.   losartan  25 MG tablet Commonly known as: COZAAR  Take 1 tablet (25 mg total) by mouth every evening.   Maxitrol 3.5-10000-0.1 Oint Generic drug: neomycin-polymyxin-dexameth Place 1 Application into the left eye 3 (three) times daily.   mirtazapine  15 MG disintegrating tablet Commonly known as: REMERON  SOL-TAB Take 15  mg by mouth at bedtime as needed.   Pharmacist Choice Lancets Misc   rosuvastatin  10 MG tablet Commonly known as: CRESTOR  TAKE 1 TABLET BY MOUTH IN THE  MORNING   spironolactone  25 MG tablet Commonly known as: ALDACTONE  Take 1 tablet (25 mg total) by mouth in the morning.   Systane 0.4-0.3 % Soln Generic drug: Polyethyl Glycol-Propyl Glycol Place 1 drop into both eyes daily as needed (dry eyes).   traZODone  50 MG tablet Commonly known as: DESYREL  Take 1 tablet (50 mg total) by mouth at bedtime.         ALLERGIES: Allergies  Allergen Reactions   Lisinopril  Other (See Comments) and Cough    Cough and throat clearing   Metoprolol Succinate Other (See Comments)    Unknown reaction   Penicillins Swelling    Has patient had a PCN reaction causing immediate rash, facial/tongue/throat swelling, SOB or lightheadedness with hypotension: Yes Has patient had a PCN reaction causing severe rash involving mucus membranes or skin necrosis: No Has patient had a PCN reaction that required hospitalization: No Has patient had a PCN reaction occurring within the last 10 years:  No If all of the above answers are "NO", then may proceed with Cephalosporin use.    Sulfamethoxazole Other (See Comments)    Unknown reaction   Sulfamethoxazole-Trimethoprim Other (See Comments)    Unknown reaction   Trimethoprim Other (See Comments)    Unknown reaction   Metformin Nausea Only     REVIEW OF SYSTEMS: A comprehensive ROS was conducted with the patient and is negative except as per HPI and below:  ROS    OBJECTIVE:   VITAL SIGNS: BP 120/74 (BP Location: Left Arm, Patient Position: Sitting, Cuff Size: Normal)   Pulse 70   Ht 5\' 5"  (1.651 m)   Wt 154 lb (69.9 kg)   SpO2 97%   BMI 25.63 kg/m    PHYSICAL EXAM:  General: Pt appears well and is in NAD  Neck: General: Supple without adenopathy or carotid bruits. Thyroid : Thyroid  size normal.  No goiter or nodules appreciated.   Lungs: Clear with good BS bilat   Heart: RRR   Abdomen:  soft, nontender  Extremities:  Lower extremities - No pretibial edema.   Neuro: MS is good with appropriate affect, pt is alert and Ox3    DM foot exam: 09/20/2023 per podiatry   DATA REVIEWED:  Lab Results  Component Value Date   HGBA1C 5.6 06/04/2023   HGBA1C 5.6 10/10/2022   HGBA1C 5.3 02/14/2022    Latest Reference Range & Units 07/17/23 11:19  Sodium 134 - 144 mmol/L 141  Potassium 3.5 - 5.2 mmol/L 4.7  Chloride 96 - 106 mmol/L 104  CO2 20 - 29 mmol/L 23  Glucose 70 - 99 mg/dL 027 (H)  BUN 8 - 27 mg/dL 14  Creatinine 2.53 - 6.64 mg/dL 4.03 (H)  Calcium  8.7 - 10.3 mg/dL 47.4  BUN/Creatinine Ratio 12 - 28  12  eGFR >59 mL/min/1.73 46 (L)      Latest Reference Range & Units 09/05/23 11:38  Total CHOL/HDL Ratio  2  Cholesterol 0 - 200 mg/dL 259  HDL Cholesterol >56.38 mg/dL 75.64  LDL (calc) 0 - 99 mg/dL 26  NonHDL  33.29  Triglycerides 0.0 - 149.0 mg/dL 51.8  VLDL 0.0 - 84.1 mg/dL 66.0       Old records , labs and images have been reviewed.   ASSESSMENT / PLAN / RECOMMENDATIONS:   1) Type 2  Diabetes Mellitus, optimally controlled, With neuropathic and CKD III complications - Most recent A1c of 6.0 %. Goal A1c < 7.0 %.    -A1c optimal at 6.0% - She used to be on Trulicity but was not able to get it for the past year - I did explain to the patient that she does not need to be on Trulicity with an A1c of 6.0% - She is already on Jardiance  per cardiology, patient is on assistance program  MEDICATIONS: Continue Jardiance  10 mg daily  EDUCATION / INSTRUCTIONS: BG monitoring instructions: Patient is instructed to check her blood sugars 1 times a .   2) Diabetic complications:  Eye: Does not have known diabetic retinopathy.  Neuro/ Feet: Does  have known diabetic peripheral neuropathy. Renal: Patient does  have known baseline CKD. She is  on an ACEI/ARB at present.  3) Hypothyroidism:  -Her most recent TSH is within normal range - No changes at this time  Medication Continue levothyroxine 88 mcg daily  Follow-up in 4 months   I spent 45 minutes preparing to see the patient by review of recent labs, imaging and procedures, obtaining and reviewing separately obtained history, communicating with the patient/family or caregiver, ordering medications, tests or procedures, and documenting clinical information in the EHR including the differential Dx, treatment, and any further evaluation and other management    Signed electronically by: Natale Bail, MD  [facility]  [facility] [address] [place] [place], [place] [address] Phone: [phone] FAX: [phone]   CC: Arcadio Knuckles, MD [address] [place] [address] Phone: [phone]  Fax: [phone]    Return to Endocrinology clinic as below: Future Appointments  Date Time Provider Department Center  09/21/2023 10:30 AM Shloima Clinch, Julian Obey, MD [facility] None  09/24/2023  1:45 PM Cathalene Clipper, [facility] H&V  02/27/2024  1:10 PM [facility]-ANNUAL WELLNESS VISIT [facility] None  03/06/2024 10:40 AM Arcadio Knuckles, MD [facility] None  07/08/2024 11:30 AM Murleen Arms, MD [facility] None  07/08/2024 12:15 PM [facility] LAB [facility] None

## 2023-09-24 ENCOUNTER — Ambulatory Visit: Attending: Cardiology | Admitting: Pharmacist

## 2023-09-24 ENCOUNTER — Encounter: Payer: Self-pay | Admitting: Pharmacist

## 2023-09-24 VITALS — BP 129/65 | HR 63 | Ht 65.0 in | Wt 154.0 lb

## 2023-09-24 DIAGNOSIS — I10 Essential (primary) hypertension: Secondary | ICD-10-CM

## 2023-09-24 MED ORDER — CARVEDILOL 12.5 MG PO TABS: 12.5000 mg | ORAL_TABLET | Freq: Two times a day (BID) | ORAL | 3 refills | Status: DC

## 2023-09-24 NOTE — Progress Notes (Signed)
 Patient ID: Katie [name]                 DOB: 02/06/1946                      MRN: [id number]     HPI: Katie [name] is a 78 y.o. female referred by Dr. Albert Huff to pharmacy clinic for HF medication management. PMH is significant for Cardiomyopathy, History of DVT, GERD, hypertension chronic kidney disease stage, acquired hypothyroidism, hyperlipidemia, prediabetes, advanced age . Most recent LVEF 20-25% on 04/26/23. Echo 1/22 EF 59% Lexiscan  2/22: EF 42% normal perfusion Echo 3/23 EF 45-50% Echo 9/24 EF 20-25%   Patient is being followed by the practice given her valvular heart disease.  Serial echocardiograms started to illustrate reduction in LVEF.  Given the cardiomyopathy she was recommended to undergo ischemic workup.  However since she was asymptomatic she did not want to undergo MPI or coronary CTA in the past.   Since her last office visit patient was seen in October 2024 by Lovette Rud -during which time cardiac MRI was ordered and she was referred to advanced heart failure to establish care.  GDMT was uptitrated.  Of note Entresto  was not refilled as it was cost prohibitive.   She saw Dr. Carollynn Cirri in October 2024 as well as January 2025.  Cardiac MRI was performed in the interim which noted moderately reduced LVEF results noted below for further reference.  It was recommended that she continue with GDMT to maximally tolerated doses.  At last visit with Dr.Tolia spironolactone  dose were increased to 25 mg daily and hydralazine  dose were reduced from 25 mg three times daily to 25 mg twice daily. F/u BMP K level was elevated Lokelma  was prescribed and k level returned to normal (suspected lab error since pt's K level historically always WNL). Enrolled in the grant for Jardiance .   At last visit with me carvedilol  dose was increased and hydralazine  was d/c'd patient home and office BP was controlled.reported she follows low salt diet and goes to gym 3 times per week. Able to perform ADLs.  Denies  LEE, PND, or orthopnea.   Today patient presented for BP/HF follow up. Reports she feels fine tolerated last carvedilol  dose increment well. She has not been checking her BP from last 2-3 weeks. However her BP at home ~ 120/69-72 heart rate ~80. Has anxiety problem she takes alprazolam  prn. Try to minimize use of it. She is on Trazodone  prn at bedtime. Her mood is improving and she is less anxious then before, therapy sessions are helping. Denies  LEE, PND, or orthopnea.Able to perform ADLs.   Current CHF meds: Jardiance  10 mg daily,  carvedilol  12.5 mg twice daily, losartan  25 mg daily, spironolactone  25 mg every morning   Previously tried:  Adherence Assessment  Do you ever forget to take your medication? Uses pill box  [] Yes [x] No  Do you ever skip doses due to side effects? [] Yes [x] No  Do you have trouble affording your medicines? [] Yes [x] No  Are you ever unable to pick up your medication due to transportation difficulties? [] Yes [x] No  Do you ever stop taking your medications because you don't believe they are helping? [] Yes [x] No  Do you check your weight daily? [] Yes [x] No   Adherence strategy: pill box   Barriers to obtaining medications: none - enrolled in the grant for brand name medications   BP goal: <130/80   Family History: Relation Problem Comments  Mother (Deceased at age 74) Deep vein thrombosis   Diabetes retinopathy/blind  Hypertension     Father (Deceased at age 31) Prostate cancer      Social History:  Alcohol: none  Smoking: never   Diet: low salt diet. Does not add slat to her home cooked meals only eats out occasionally.   Exercise: 30 min walks and some resistance at gym -2 times per week planning to go to 3-4 times per week   Home BP readings: ~ 120/69-72 heart rate ~80  Wt Readings from Last 3 Encounters:  09/24/23 154 lb (69.9 kg)  09/21/23 154 lb (69.9 kg)  09/05/23 152 lb 12.8 oz (69.3 kg)   BP Readings from Last 3  Encounters:  09/24/23 129/65  09/21/23 120/74  09/05/23 126/82   Pulse Readings from Last 3 Encounters:  09/24/23 63  09/21/23 70  09/05/23 (!) 54    Renal function: CrCl cannot be calculated (Patient's most recent lab result is older than the maximum 21 days allowed.).  Past Medical History:  Diagnosis Date   Acute venous embolism and thrombosis of unspecified deep vessels of lower extremity 2001   RLE DVT and PE, coumadin x 74mo, now ASSA   Allergy    Anxiety state, unspecified    Blood transfusion without reported diagnosis    Cataract    Chronic idiopathic constipation 11/02/2016   Clotting disorder (HCC)    Degeneration of cervical intervertebral disc    Gallstones    History of DVT (deep vein thrombosis)    Iron deficiency anemia secondary to blood loss (chronic)    Irritable bowel syndrome    Lumbago    Other B-complex deficiencies    Personal history of colonic polyps    adenomatous   Pure hypercholesterolemia    Reflux esophagitis    Tubular adenoma of colon 09/2002   Type II or unspecified type diabetes mellitus without mention of complication, not stated as uncontrolled    Unspecified essential hypertension    Unspecified hypothyroidism    Unspecified iridocyclitis    Unspecified venous (peripheral) insufficiency    Unspecified vitamin D  deficiency     Current Outpatient Medications on File Prior to Visit  Medication Sig Dispense Refill   ACCU-CHEK GUIDE TEST test strip SMARTSIG:Strip(s)     ALPRAZolam  (XANAX ) 0.5 MG tablet TAKE 1 TABLET BY MOUTH THREE TIMES DAILY AS NEEDED FOR ANXIETY OR SLEEP 270 tablet 0   aspirin  81 MG chewable tablet Chew 81 mg by mouth every morning.     Cholecalciferol  50 MCG (2000 UT) TABS Take 1 tablet (2,000 Units total) by mouth daily. 90 tablet 1   cyanocobalamin  (VITAMIN B12) 1000 MCG/ML injection INJECT 1 ML INTO THE MUSCLE ONCE EVERY MONTH 3 mL 0   empagliflozin  (JARDIANCE ) 10 MG TABS tablet Take 1 tablet (10 mg total) by  mouth in the morning.     famotidine  (PEPCID ) 20 MG tablet Take 2 tablets by mouth once daily (Patient taking differently: Take 20 mg by mouth daily.) 180 tablet 0   levothyroxine (SYNTHROID) 88 MCG tablet Take 1 tablet (88 mcg total) by mouth daily before breakfast. 90 tablet 3   magnesium hydroxide (DULCOLAX) 400 MG/5ML suspension Take 15 mLs by mouth daily as needed for mild constipation.     MAXITROL 3.5-10000-0.1 OINT Place 1 Application into the left eye 3 (three) times daily.     mirtazapine  (REMERON  SOL-TAB) 15 MG disintegrating tablet Take 15 mg by mouth at bedtime as needed.  Omega-3 Fatty Acids (FISH OIL PO) Take 1,400 mg by mouth daily.     Pharmacist Choice Lancets MISC      Polyethyl Glycol-Propyl Glycol (SYSTANE) 0.4-0.3 % SOLN Place 1 drop into both eyes daily as needed (dry eyes).     rosuvastatin  (CRESTOR ) 10 MG tablet TAKE 1 TABLET BY MOUTH IN THE  MORNING 90 tablet 3   spironolactone  (ALDACTONE ) 25 MG tablet Take 1 tablet (25 mg total) by mouth in the morning. 90 tablet 3   traZODone  (DESYREL ) 50 MG tablet Take 1 tablet (50 mg total) by mouth at bedtime. 90 tablet 0   losartan  (COZAAR ) 25 MG tablet Take 1 tablet (25 mg total) by mouth every evening.     No current facility-administered medications on file prior to visit.    Allergies  Allergen Reactions   Lisinopril  Other (See Comments) and Cough    Cough and throat clearing   Metoprolol Succinate Other (See Comments)    Unknown reaction   Penicillins Swelling    Has patient had a PCN reaction causing immediate rash, facial/tongue/throat swelling, SOB or lightheadedness with hypotension: Yes Has patient had a PCN reaction causing severe rash involving mucus membranes or skin necrosis: No Has patient had a PCN reaction that required hospitalization: No Has patient had a PCN reaction occurring within the last 10 years: No If all of the above answers are "NO", then may proceed with Cephalosporin use.     Sulfamethoxazole Other (See Comments)    Unknown reaction   Sulfamethoxazole-Trimethoprim Other (See Comments)    Unknown reaction   Trimethoprim Other (See Comments)    Unknown reaction   Metformin Nausea Only     Assessment/Plan:  1. CHF -  Essential hypertension Assessment: BP is controlled in office BP 129/65 mmHg heart rate 69 (goal<130/80). Last EF 20-25% on 04/26/23.  Tolerates all HF/BP medications well without any side effects Denies SOB, palpitation, chest pain, headaches,or swelling Able to perform ADLs. Denies  LEE, PND, or orthopnea. Appetite has been less Go to gym 3 times per week ( 30 min cardio + 20 min resistance) follows low salt diet   Plan:  Increase carvedilol  dose from 6.25 mg twice daily to 12.5 mg twice daily,  Continue taking Jardiance  10 mg daily,  losartan  25 mg daily, spironolactone  25 mg every morning   Patient to keep record of BP readings with heart rate and report to us  at the next visit Future plan to switch [facility] to [facility] ( enrolled in the grant for Jardiance )  Patient to see PharmD in 6-8 weeks for follow up  Follow up lab(s) :none   Essential hypertension Overview: Estimated Creatinine Clearance: 40.9 mL/min (A) (by C-G formula based on SCr of 1.3 mg/dL (A)).  Estimated Creatinine Clearance: 41 mL/min (by C-G formula based on SCr of 1.15 mg/dL).   Assessment & Plan: Assessment: BP is controlled in office BP 129/65 mmHg heart rate 69 (goal<130/80). Last EF 20-25% on 04/26/23.  Tolerates all HF/BP medications well without any side effects Denies SOB, palpitation, chest pain, headaches,or swelling Able to perform ADLs. Denies  LEE, PND, or orthopnea. Appetite has been less Go to gym 3 times per week ( 30 min cardio + 20 min resistance) follows low salt diet   Plan:  Increase carvedilol  dose from 6.25 mg twice daily to 12.5 mg twice daily,  Continue taking Jardiance  10 mg daily,  losartan  25 mg daily, spironolactone  25 mg every morning    Patient to keep record  of BP readings with heart rate and report to us  at the next visit Future plan to switch [facility] to [facility] ( enrolled in the grant for Jardiance )  Patient to see PharmD in 6-8 weeks for follow up  Follow up lab(s) :none     Other orders -     Carvedilol ; Take 1 tablet (12.5 mg total) by mouth 2 (two) times daily.  Dispense: 180 tablet; Refill: 3        Thank you   Nickola Baron, Pharm.D [facility] [address], [place], [place] [address]  Phone: [phone]; Fax: [phone]

## 2023-09-24 NOTE — Patient Instructions (Addendum)
 Changes made by your pharmacist Nickola Baron, PharmD at today's visit:    Instructions/Changes  (what do you need to do) Your Notes  (what you did and when you did it)  Increase carvedilol  dose from 6.25 twice daily to 12.5 mg twice daily    Continue taking Jardiance  10 mg daily,  losartan  25 mg daily, spironolactone  25 mg every morning      Bring all of your meds, your BP cuff and your record of home blood pressures to your next appointment.    HOW TO TAKE YOUR BLOOD PRESSURE AT HOME  Rest 5 minutes before taking your blood pressure.  Don't smoke or drink caffeinated beverages for at least 30 minutes before. Take your blood pressure before (not after) you eat. Sit comfortably with your back supported and both feet on the floor (don't cross your legs). Elevate your arm to heart level on a table or a desk. Use the proper sized cuff. It should fit smoothly and snugly around your bare upper arm. There should be enough room to slip a fingertip under the cuff. The bottom edge of the cuff should be 1 inch above the crease of the elbow. Ideally, take 3 measurements at one sitting and record the average.  Important lifestyle changes to control high blood pressure  Intervention  Effect on the BP  Lose extra pounds and watch your waistline Weight loss is one of the most effective lifestyle changes for controlling blood pressure. If you're overweight or obese, losing even a small amount of weight can help reduce blood pressure. Blood pressure might go down by about 1 millimeter of mercury (mm Hg) with each kilogram (about 2.2 pounds) of weight lost.  Exercise regularly As a general goal, aim for at least 30 minutes of moderate physical activity every day. Regular physical activity can lower high blood pressure by about 5 to 8 mm Hg.  Eat a healthy diet Eating a diet rich in whole grains, fruits, vegetables, and low-fat dairy products and low in saturated fat and cholesterol. A healthy diet can  lower high blood pressure by up to 11 mm Hg.  Reduce salt (sodium) in your diet Even a small reduction of sodium in the diet can improve heart health and reduce high blood pressure by about 5 to 6 mm Hg.  Limit alcohol One drink equals 12 ounces of beer, 5 ounces of wine, or 1.5 ounces of 80-proof liquor.  Limiting alcohol to less than one drink a day for women or two drinks a day for men can help lower blood pressure by about 4 mm Hg.   If you have any questions or concerns please use My Chart to send questions or call the office at [phone]

## 2023-09-24 NOTE — Assessment & Plan Note (Signed)
 Assessment: BP is controlled in office BP 129/65 mmHg heart rate 69 (goal<130/80). Last EF 20-25% on 04/26/23.  Tolerates all HF/BP medications well without any side effects Denies SOB, palpitation, chest pain, headaches,or swelling Able to perform ADLs. Denies  LEE, PND, or orthopnea. Appetite has been less Go to gym 3 times per week ( 30 min cardio + 20 min resistance) follows low salt diet   Plan:  Increase carvedilol  dose from 6.25 mg twice daily to 12.5 mg twice daily,  Continue taking Jardiance  10 mg daily,  losartan  25 mg daily, spironolactone  25 mg every morning   Patient to keep record of BP readings with heart rate and report to us  at the next visit Future plan to switch [facility] to [facility] ( enrolled in the grant for Jardiance )  Patient to see PharmD in 6-8 weeks for follow up  Follow up lab(s) :none

## 2023-09-25 NOTE — Progress Notes (Unsigned)
    Subjective:    Patient ID: Katie [name], female    DOB: [birth year], 78 y.o.   MRN: [id number]      HPI Katie [name] is here for No chief complaint on file.    Hoarseness -     Has reflux esophagitis -  taking famotidine  20 mg daily.  Medications and allergies reviewed with patient and updated if appropriate.  Current Outpatient Medications on File Prior to Visit  Medication Sig Dispense Refill   ACCU-CHEK GUIDE TEST test strip SMARTSIG:Strip(s)     ALPRAZolam  (XANAX ) 0.5 MG tablet TAKE 1 TABLET BY MOUTH THREE TIMES DAILY AS NEEDED FOR ANXIETY OR SLEEP 270 tablet 0   aspirin  81 MG chewable tablet Chew 81 mg by mouth every morning.     carvedilol  (COREG ) 12.5 MG tablet Take 1 tablet (12.5 mg total) by mouth 2 (two) times daily. 180 tablet 3   Cholecalciferol  50 MCG (2000 UT) TABS Take 1 tablet (2,000 Units total) by mouth daily. 90 tablet 1   cyanocobalamin  (VITAMIN B12) 1000 MCG/ML injection INJECT 1 ML INTO THE MUSCLE ONCE EVERY MONTH 3 mL 0   empagliflozin  (JARDIANCE ) 10 MG TABS tablet Take 1 tablet (10 mg total) by mouth in the morning.     famotidine  (PEPCID ) 20 MG tablet Take 2 tablets by mouth once daily (Patient taking differently: Take 20 mg by mouth daily.) 180 tablet 0   levothyroxine (SYNTHROID) 88 MCG tablet Take 1 tablet (88 mcg total) by mouth daily before breakfast. 90 tablet 3   losartan  (COZAAR ) 25 MG tablet Take 1 tablet (25 mg total) by mouth every evening.     magnesium hydroxide (DULCOLAX) 400 MG/5ML suspension Take 15 mLs by mouth daily as needed for mild constipation.     MAXITROL 3.5-10000-0.1 OINT Place 1 Application into the left eye 3 (three) times daily.     mirtazapine  (REMERON  SOL-TAB) 15 MG disintegrating tablet Take 15 mg by mouth at bedtime as needed.     Omega-3 Fatty Acids (FISH OIL PO) Take 1,400 mg by mouth daily.     Pharmacist Choice Lancets MISC      Polyethyl Glycol-Propyl Glycol (SYSTANE) 0.4-0.3 % SOLN Place 1 drop into both eyes daily as  needed (dry eyes).     rosuvastatin  (CRESTOR ) 10 MG tablet TAKE 1 TABLET BY MOUTH IN THE  MORNING 90 tablet 3   spironolactone  (ALDACTONE ) 25 MG tablet Take 1 tablet (25 mg total) by mouth in the morning. 90 tablet 3   traZODone  (DESYREL ) 50 MG tablet Take 1 tablet (50 mg total) by mouth at bedtime. 90 tablet 0   No current facility-administered medications on file prior to visit.    Review of Systems     Objective:  There were no vitals filed for this visit. BP Readings from Last 3 Encounters:  09/24/23 129/65  09/21/23 120/74  09/05/23 126/82   Wt Readings from Last 3 Encounters:  09/24/23 154 lb (69.9 kg)  09/21/23 154 lb (69.9 kg)  09/05/23 152 lb 12.8 oz (69.3 kg)   There is no height or weight on file to calculate BMI.    Physical Exam         Assessment & Plan:    See Problem List for Assessment and Plan of chronic medical problems.

## 2023-09-26 ENCOUNTER — Encounter: Payer: Self-pay | Admitting: Internal Medicine

## 2023-09-26 ENCOUNTER — Ambulatory Visit: Admitting: Internal Medicine

## 2023-09-26 VITALS — BP 116/74 | HR 66 | Temp 98.0°F | Ht 65.0 in | Wt 154.0 lb

## 2023-09-26 DIAGNOSIS — R490 Dysphonia: Secondary | ICD-10-CM

## 2023-09-26 NOTE — Patient Instructions (Addendum)
    Start taking 2 famotidine  every day.     A referral was ordered [facility] and someone will call you to schedule an appointment.     Return if symptoms worsen or fail to improve.

## 2023-09-28 ENCOUNTER — Other Ambulatory Visit: Payer: Self-pay

## 2023-09-28 NOTE — Telephone Encounter (Signed)
 Patient called stating she need her diabetic supplies Rx renewed by [facility] and they informed her that they faxed over a request last week, however no notes noted about the supplies and no faxes have been received today. Patient requested fax number to give to them. Clinical pool fax number given.

## 2023-10-03 ENCOUNTER — Telehealth: Payer: Self-pay | Admitting: Cardiology

## 2023-10-03 NOTE — Telephone Encounter (Signed)
 Pt c/o medication issue:  1. Name of Medication: carvedilol  (COREG ) 12.5 MG tablet   2. How are you currently taking this medication (dosage and times per day)? 12.5 mg, 2 times daily   3. Are you having a reaction (difficulty breathing--STAT)? No  4. What is your medication issue? Pt is having an issues with not being able to belch and thinks it may be coming from the med. Requesting cb

## 2023-10-03 NOTE — Telephone Encounter (Signed)
 Patient is returning phone call.

## 2023-10-03 NOTE — Telephone Encounter (Signed)
 Patient states she has had trouble belching up excess gas in stomach. She states she worries a lot and was concerned about her heart.  Patient denies any chest pain, SOB, fatigue or nausea.   Patient states she drank a small can of Pepsi this morning and this helped her to belch and she felt better after this.  Told patient I do not see this as a side effect of carvedilol  and if it continues she should follow-up with her PCP about it.  Patient asked when she is due for F/U with HF Clinic and Dr. Albert Huff. Patient is due to F/U with Dr. Julane Ny in July, provided HF Clinic phone number and instructed her to call to schedule her appt. Patient is also due to F/U with Dr. Albert Huff in July, instructed her to call to schedule after her appt with HF Clinic.  Patient verbalized understanding.  Will forward to Pharm D to review.

## 2023-10-03 NOTE — Telephone Encounter (Signed)
 Left voicemail to return call to office

## 2023-10-04 NOTE — Telephone Encounter (Signed)
 Spoke to pt, addressed gas/belching problem. Advised to keep food dairy and to take fish oil and carvedilol  with food. Pt was appreciative of phone call.

## 2023-10-09 DIAGNOSIS — K219 Gastro-esophageal reflux disease without esophagitis: Secondary | ICD-10-CM | POA: Diagnosis not present

## 2023-10-09 DIAGNOSIS — J387 Other diseases of larynx: Secondary | ICD-10-CM | POA: Diagnosis not present

## 2023-10-09 DIAGNOSIS — R490 Dysphonia: Secondary | ICD-10-CM | POA: Diagnosis not present

## 2023-10-09 DIAGNOSIS — H6123 Impacted cerumen, bilateral: Secondary | ICD-10-CM | POA: Diagnosis not present

## 2023-10-24 ENCOUNTER — Encounter: Payer: Self-pay | Admitting: Cardiology

## 2023-10-30 ENCOUNTER — Other Ambulatory Visit: Payer: Self-pay | Admitting: Internal Medicine

## 2023-10-30 DIAGNOSIS — E538 Deficiency of other specified B group vitamins: Secondary | ICD-10-CM

## 2023-10-31 ENCOUNTER — Ambulatory Visit: Attending: Cardiology | Admitting: Pharmacist

## 2023-10-31 ENCOUNTER — Encounter: Payer: Self-pay | Admitting: Pharmacist

## 2023-10-31 VITALS — BP 112/68 | HR 75

## 2023-10-31 DIAGNOSIS — I10 Essential (primary) hypertension: Secondary | ICD-10-CM

## 2023-10-31 MED ORDER — ENTRESTO 24-26 MG PO TABS: 1.0000 | ORAL_TABLET | Freq: Two times a day (BID) | ORAL | 11 refills | Status: DC

## 2023-10-31 NOTE — Progress Notes (Signed)
 Patient ID: Katie [name]                 DOB: [birth year]                      MRN: [id number]     HPI: Katie [name] is a 78 y.o. female referred by Katie [name] to pharmacy clinic for HF medication management. PMH is significant for Cardiomyopathy, History of DVT, GERD, hypertension chronic kidney disease stage, acquired hypothyroidism, hyperlipidemia, prediabetes, advanced age . Most recent LVEF 20-25% on 04/26/23. Echo 1/22 EF 59% Lexiscan  2/22: EF 42% normal perfusion Echo 3/23 EF 45-50% Echo 9/24 EF 20-25%   Patient is being followed by the practice given her valvular heart disease.  Serial echocardiograms started to illustrate reduction in LVEF.  Given the cardiomyopathy she was recommended to undergo ischemic workup.  However since she was asymptomatic she did not want to undergo MPI or coronary CTA in the past.   Since her last office visit patient was seen in October 2024 by Katie [name] -during which time cardiac MRI was ordered and she was referred to advanced heart failure to establish care.  GDMT was uptitrated.  Of note Entresto  was not refilled as it was cost prohibitive.   She saw Katie [name] in October 2024 as well as January 2025.  Cardiac MRI was performed in the interim which noted moderately reduced LVEF results noted below for further reference.  It was recommended that she continue with GDMT to maximally tolerated doses.  At last visit with Katie [name] spironolactone  dose were increased to 25 mg daily and hydralazine  dose were reduced from 25 mg three times daily to 25 mg twice daily. F/u BMP K level was elevated Lokelma  was prescribed and k level returned to normal (suspected lab error since pt's K level historically always WNL). Enrolled in the grant for Jardiance .   At last visit with me carvedilol  dose was increased and hydralazine  was d/c'd patient home and office BP was controlled.reported she follows low salt diet and goes to gym 3 times per week. Able to perform ADLs.  Denies  LEE, PND, or orthopnea. Has anxiety problem she takes alprazolam  prn. Try to minimize use of it. She is on Trazodone  prn at bedtime. Her mood is improving and she is less anxious then before, therapy sessions are helping. Patient presented today with her good friend. She reports feeling generally well. At home, her blood pressure has been in the range of 115-120/73-75 with a heart rate of 75. She denies any dizziness, shortness of breath, palpitations, swelling, or headache. Home BP monitor validated today found to be accurate   She reports feeling bloated. She was recently started on omeprazole by ENT, which has helped with hoarseness of voice and bloating, although the bloating has not completely resolved. Recommended trial of OTC Gas-X.  Her anxiety has improved significantly, and she is considering restarting her exercise sessions with her friend.  Her main ongoing concern is lower back pain, which causes occasional imbalance in her right leg. She plans to speak with her PCP about obtaining a referral to the spine specialist she saw a few years ago.  To optimize heart failure management, the patient agreed to switch from losartan  to low-dose Entresto .  She was informed that she has been enrolled in a grant program, which will reduce the cost of Entresto  to $0.  Given her history of elevated potassium (previously up to 5.5), the dose of spironolactone  will  be reduced to 12.5 mg daily to minimize the risk of hyperkalemia. Current CHF meds: Jardiance  10 mg daily,  carvedilol  12.5 mg twice daily, losartan  25 mg daily, spironolactone  12.5 mg every morning    Previously tried:  Adherence Assessment  Do you ever forget to take your medication? Uses pill box  [] Yes [x] No  Do you ever skip doses due to side effects? [] Yes [x] No  Do you have trouble affording your medicines? [] Yes [x] No  Are you ever unable to pick up your medication due to transportation difficulties? [] Yes [x] No  Do  you ever stop taking your medications because you don't believe they are helping? [] Yes [x] No  Do you check your weight daily? [] Yes [x] No   Adherence strategy: pill box   Barriers to obtaining medications: none - enrolled in the grant for brand name medications   BP goal: <130/80   Family History: Relation Problem Comments  Mother (Deceased at age 34) Deep vein thrombosis   Diabetes retinopathy/blind  Hypertension     Father (Deceased at age 60) Prostate cancer      Social History:  Alcohol: none  Smoking: never   Diet: low salt diet. Does not add slat to her home cooked meals only eats out occasionally.   Exercise: 30 min walks and some resistance at gym -2 times per week planning to go to 3-4 times per week   Home BP readings: ~ 120/69-72 heart rate ~80  Wt Readings from Last 3 Encounters:  09/26/23 154 lb (69.9 kg)  09/24/23 154 lb (69.9 kg)  09/21/23 154 lb (69.9 kg)   BP Readings from Last 3 Encounters:  10/31/23 112/68  09/26/23 116/74  09/24/23 129/65   Pulse Readings from Last 3 Encounters:  10/31/23 75  09/26/23 66  09/24/23 63    Renal function: CrCl cannot be calculated (Patient's most recent lab result is older than the maximum 21 days allowed.).  Past Medical History:  Diagnosis Date   Acute venous embolism and thrombosis of unspecified deep vessels of lower extremity 2001   RLE DVT and PE, coumadin x 35mo, now ASSA   Allergy    Anxiety state, unspecified    Blood transfusion without reported diagnosis    Cataract    Chronic idiopathic constipation 11/02/2016   Clotting disorder (HCC)    Degeneration of cervical intervertebral disc    Gallstones    History of DVT (deep vein thrombosis)    Iron deficiency anemia secondary to blood loss (chronic)    Irritable bowel syndrome    Lumbago    Other B-complex deficiencies    Personal history of colonic polyps    adenomatous   Pure hypercholesterolemia    Reflux esophagitis    Tubular  adenoma of colon 09/2002   Type II or unspecified type diabetes mellitus without mention of complication, not stated as uncontrolled    Unspecified essential hypertension    Unspecified hypothyroidism    Unspecified iridocyclitis    Unspecified venous (peripheral) insufficiency    Unspecified vitamin D  deficiency     Current Outpatient Medications on File Prior to Visit  Medication Sig Dispense Refill   ACCU-CHEK GUIDE TEST test strip SMARTSIG:Strip(s)     ALPRAZolam  (XANAX ) 0.5 MG tablet TAKE 1 TABLET BY MOUTH THREE TIMES DAILY AS NEEDED FOR ANXIETY OR SLEEP 270 tablet 0   aspirin  81 MG chewable tablet Chew 81 mg by mouth every morning.     carvedilol  (COREG ) 12.5 MG tablet Take 1 tablet (12.5 mg total) by  mouth 2 (two) times daily. 180 tablet 3   Cholecalciferol  50 MCG (2000 UT) TABS Take 1 tablet (2,000 Units total) by mouth daily. 90 tablet 1   cyanocobalamin  (VITAMIN B12) 1000 MCG/ML injection INJECT 1 ML  INTRAMUSCULARLY ONCE EVERY MONTH 3 mL 0   empagliflozin  (JARDIANCE ) 10 MG TABS tablet Take 1 tablet (10 mg total) by mouth in the morning.     famotidine  (PEPCID ) 20 MG tablet Take 2 tablets by mouth once daily (Patient taking differently: Take 20 mg by mouth daily.) 180 tablet 0   levothyroxine  (SYNTHROID ) 88 MCG tablet Take 1 tablet (88 mcg total) by mouth daily before breakfast. 90 tablet 3   magnesium hydroxide (DULCOLAX) 400 MG/5ML suspension Take 15 mLs by mouth daily as needed for mild constipation.     MAXITROL 3.5-10000-0.1 OINT Place 1 Application into the left eye 3 (three) times daily.     mirtazapine  (REMERON  SOL-TAB) 15 MG disintegrating tablet Take 15 mg by mouth at bedtime as needed.     Omega-3 Fatty Acids (FISH OIL PO) Take 1,400 mg by mouth daily.     Pharmacist Choice Lancets MISC      Polyethyl Glycol-Propyl Glycol (SYSTANE) 0.4-0.3 % SOLN Place 1 drop into both eyes daily as needed (dry eyes).     rosuvastatin  (CRESTOR ) 10 MG tablet TAKE 1 TABLET BY MOUTH IN THE   MORNING 90 tablet 3   spironolactone  (ALDACTONE ) 25 MG tablet Take 1 tablet (25 mg total) by mouth in the morning. 90 tablet 3   traZODone  (DESYREL ) 50 MG tablet Take 1 tablet (50 mg total) by mouth at bedtime. 90 tablet 0   No current facility-administered medications on file prior to visit.    Allergies  Allergen Reactions   Lisinopril  Other (See Comments) and Cough    Cough and throat clearing   Metoprolol Succinate Other (See Comments)    Unknown reaction   Penicillins Swelling    Has patient had a PCN reaction causing immediate rash, facial/tongue/throat swelling, SOB or lightheadedness with hypotension: Yes Has patient had a PCN reaction causing severe rash involving mucus membranes or skin necrosis: No Has patient had a PCN reaction that required hospitalization: No Has patient had a PCN reaction occurring within the last 10 years: No If all of the above answers are NO, then may proceed with Cephalosporin use.    Sulfamethoxazole Other (See Comments)    Unknown reaction   Sulfamethoxazole-Trimethoprim Other (See Comments)    Unknown reaction   Trimethoprim Other (See Comments)    Unknown reaction   Metformin Nausea Only     Assessment/Plan:  1. CHF -  Essential hypertension Assessment: BP is controlled in office BP 112/68 mmHg heart rate 75 (goal<130/80). Last EF 20-25% on 04/26/23.  Tolerates all HF/BP medications well without any side effects Denies SOB, palpitation, chest pain, headaches,or swelling Able to perform ADLs. Denies  LEE, PND, or orthopnea. Appetite has been less Follows low salt diet and will be restarting gym                                                             Plan:  Stop taking Losartan  25 mg and start taking Entresto  24/26 twice daily ( enrolled in the grant to make it affordable)  Reduce dose of  spironolactone  from 25 mg daily to 12.5 mg daily ( 1/2 tab of 25 mg)  Continue taking Jardiance  10 mg daily,carvedilol  12.5 mg twice daily   Patient to keep record of BP readings with heart rate and report to us  at the next visit Patient to see PharmD in 4 weeks for follow up  Follow up lab: BMP on July 2   Essential hypertension Overview: Estimated Creatinine Clearance: 40.9 mL/min (A) (by C-G formula based on SCr of 1.3 mg/dL (A)).  Estimated Creatinine Clearance: 41 mL/min (by C-G formula based on SCr of 1.15 mg/dL).   Assessment & Plan: Assessment: BP is controlled in office BP 112/68 mmHg heart rate 75 (goal<130/80). Last EF 20-25% on 04/26/23.  Tolerates all HF/BP medications well without any side effects Denies SOB, palpitation, chest pain, headaches,or swelling Able to perform ADLs. Denies  LEE, PND, or orthopnea. Appetite has been less Follows low salt diet and will be restarting gym                                                             Plan:  Stop taking Losartan  25 mg and start taking Entresto  24/26 twice daily ( enrolled in the grant to make it affordable)  Reduce dose of spironolactone  from 25 mg daily to 12.5 mg daily ( 1/2 tab of 25 mg)  Continue taking Jardiance  10 mg daily,carvedilol  12.5 mg twice daily  Patient to keep record of BP readings with heart rate and report to us  at the next visit Patient to see PharmD in 4 weeks for follow up  Follow up lab: BMP on July 2    Orders: -     Basic metabolic panel with GFR  Other orders -     Entresto ; Take 1 tablet by mouth 2 (two) times daily.  Dispense: 60 tablet; Refill: 11     Thank you   Nickola Baron, Pharm.D [facility] of [facility] [address] [place], [place] [address]  Phone: [phone]; Fax: [phone]

## 2023-10-31 NOTE — Assessment & Plan Note (Addendum)
 Assessment: BP is controlled in office BP 112/68 mmHg heart rate 75 (goal<130/80). Last EF 20-25% on 04/26/23.  Tolerates all HF/BP medications well without any side effects Denies SOB, palpitation, chest pain, headaches,or swelling Able to perform ADLs. Denies  LEE, PND, or orthopnea. Appetite has been less Follows low salt diet and will be restarting gym                                                             Plan:  Stop taking Losartan  25 mg and start taking Entresto  24/26 twice daily ( enrolled in the grant to make it affordable)  Reduce dose of spironolactone  from 25 mg daily to 12.5 mg daily ( 1/2 tab of 25 mg)  Continue taking Jardiance  10 mg daily,carvedilol  12.5 mg twice daily  Patient to keep record of BP readings with heart rate and report to us  at the next visit Patient to see PharmD in 4 weeks for follow up  Follow up lab: BMP on July 2

## 2023-10-31 NOTE — Patient Instructions (Addendum)
 Changes made by your pharmacist Nickola Baron, PharmD at today's visit:    Instructions/Changes  (what do you need to do) Your Notes  (what you did and when you did it)  Stop taking losartan  25 mg daily and start taking Entresto  24/26 mg  twice daily    Reduce spironolactone  dose from 25 mg to 12.5 mg daily ( 1/2 table in the evening)    Ger BMP lab done on November 14, 2023    Bring all of your meds, your BP cuff and your record of home blood pressures to your next appointment.    HOW TO TAKE YOUR BLOOD PRESSURE AT HOME  Rest 5 minutes before taking your blood pressure.  Don't smoke or drink caffeinated beverages for at least 30 minutes before. Take your blood pressure before (not after) you eat. Sit comfortably with your back supported and both feet on the floor (don't cross your legs). Elevate your arm to heart level on a table or a desk. Use the proper sized cuff. It should fit smoothly and snugly around your bare upper arm. There should be enough room to slip a fingertip under the cuff. The bottom edge of the cuff should be 1 inch above the crease of the elbow. Ideally, take 3 measurements at one sitting and record the average.  Important lifestyle changes to control high blood pressure  Intervention  Effect on the BP  Lose extra pounds and watch your waistline Weight loss is one of the most effective lifestyle changes for controlling blood pressure. If you're overweight or obese, losing even a small amount of weight can help reduce blood pressure. Blood pressure might go down by about 1 millimeter of mercury (mm Hg) with each kilogram (about 2.2 pounds) of weight lost.  Exercise regularly As a general goal, aim for at least 30 minutes of moderate physical activity every day. Regular physical activity can lower high blood pressure by about 5 to 8 mm Hg.  Eat a healthy diet Eating a diet rich in whole grains, fruits, vegetables, and low-fat dairy products and low in saturated fat and  cholesterol. A healthy diet can lower high blood pressure by up to 11 mm Hg.  Reduce salt (sodium) in your diet Even a small reduction of sodium in the diet can improve heart health and reduce high blood pressure by about 5 to 6 mm Hg.  Limit alcohol One drink equals 12 ounces of beer, 5 ounces of wine, or 1.5 ounces of 80-proof liquor.  Limiting alcohol to less than one drink a day for women or two drinks a day for men can help lower blood pressure by about 4 mm Hg.   If you have any questions or concerns please use My Chart to send questions or call the office at [phone]

## 2023-11-01 DIAGNOSIS — G5762 Lesion of plantar nerve, left lower limb: Secondary | ICD-10-CM | POA: Diagnosis not present

## 2023-11-01 DIAGNOSIS — I739 Peripheral vascular disease, unspecified: Secondary | ICD-10-CM | POA: Diagnosis not present

## 2023-11-01 DIAGNOSIS — G5761 Lesion of plantar nerve, right lower limb: Secondary | ICD-10-CM | POA: Diagnosis not present

## 2023-11-01 DIAGNOSIS — L603 Nail dystrophy: Secondary | ICD-10-CM | POA: Diagnosis not present

## 2023-11-01 DIAGNOSIS — E1151 Type 2 diabetes mellitus with diabetic peripheral angiopathy without gangrene: Secondary | ICD-10-CM | POA: Diagnosis not present

## 2023-11-01 DIAGNOSIS — M65871 Other synovitis and tenosynovitis, right ankle and foot: Secondary | ICD-10-CM | POA: Diagnosis not present

## 2023-11-05 ENCOUNTER — Encounter: Payer: Self-pay | Admitting: Family Medicine

## 2023-11-05 ENCOUNTER — Ambulatory Visit (INDEPENDENT_AMBULATORY_CARE_PROVIDER_SITE_OTHER): Admitting: Family Medicine

## 2023-11-05 VITALS — BP 108/60 | HR 70 | Temp 97.9°F | Ht 65.0 in | Wt 154.4 lb

## 2023-11-05 DIAGNOSIS — G8929 Other chronic pain: Secondary | ICD-10-CM | POA: Diagnosis not present

## 2023-11-05 DIAGNOSIS — M545 Low back pain, unspecified: Secondary | ICD-10-CM

## 2023-11-05 NOTE — Patient Instructions (Signed)
 I have sent in a referral to [facility] for you for physical medicine to help with your back pain.  Keep using Tiger Balm and heat as needed.  Someone will be reaching out to get you scheduled.

## 2023-11-05 NOTE — Progress Notes (Signed)
   Acute Office Visit  Subjective:     Patient ID: Katie [name], female    DOB: 1945/08/13, 78 y.o.   MRN: [id number]  Chief Complaint  Patient presents with   Acute Visit    Tailbone pain, intermittent for a few months. Had epidural injection in the past that helped     HPI Patient is in today for evaluation of low back pain. This has been present for years.  Has history of DJD.  Has seen neurosurgery in the past.  Has had epidural injections in the past with relief. States that she has been using Tiger balm and a heating pad.  Reports that she is feeling a little bit unsteady in the lower right leg at times, especially when her back is really bothering her. Pain is worse with activity, better with rest. Has not been taking anything orally to help with pain. Denies numbness, tingling, bowel or bladder changes, other concerns.   ROS Per HPI      Objective:    BP 108/60 (BP Location: Left Arm, Patient Position: Sitting)   Pulse 70   Temp 97.9 F (36.6 C) (Temporal)   Ht 5' 5 (1.651 m)   Wt 154 lb 6.4 oz (70 kg)   SpO2 95%   BMI 25.69 kg/m    Physical Exam Vitals and nursing note reviewed.  Constitutional:      General: She is not in acute distress.    Appearance: Normal appearance. She is normal weight.  HENT:     Head: Normocephalic and atraumatic.     Right Ear: External ear normal.     Left Ear: External ear normal.     Nose: Nose normal.     Mouth/Throat:     Mouth: Mucous membranes are moist.     Pharynx: Oropharynx is clear.   Eyes:     Extraocular Movements: Extraocular movements intact.     Pupils: Pupils are equal, round, and reactive to light.    Cardiovascular:     Rate and Rhythm: Normal rate.  Pulmonary:     Effort: Pulmonary effort is normal.   Musculoskeletal:     Cervical back: Normal range of motion.     Right lower leg: No edema.     Left lower leg: No edema.     Comments: Limited range of motion to the low back, especially with  standing, sitting, changing positions, twisting.  No swelling, no bruising, no erythema, no obvious deformity  Lymphadenopathy:     Cervical: No cervical adenopathy.   Neurological:     General: No focal deficit present.     Mental Status: She is alert and oriented to person, place, and time.   Psychiatric:        Mood and Affect: Mood normal.        Thought Content: Thought content normal.    No results found for any visits on 11/05/23.      Assessment & Plan:   Chronic bilateral low back pain without sciatica -     Ambulatory referral to Physical Medicine Rehab  Handouts for low back pain rehab given  No orders of the defined types were placed in this encounter.   Return if symptoms worsen or fail to improve.  Corean LITTIE Ku, FNP

## 2023-11-08 ENCOUNTER — Telehealth: Payer: Self-pay | Admitting: Pharmacist

## 2023-11-08 NOTE — Telephone Encounter (Signed)
 Left detailed message per DPR that pt could take those medications together per pharmD.

## 2023-11-08 NOTE — Telephone Encounter (Signed)
 Pt c/o medication issue:  1. Name of Medication:   sacubitril-valsartan (ENTRESTO ) 24-26 MG    empagliflozin  (JARDIANCE ) 10 MG TABS tablet    aspirin  81 MG chewable tablet  carvedilol  (COREG ) 12.5 MG tablet   2. How are you currently taking this medication (dosage and times per day)?  Take 1 tablet by mouth 2 (two) times daily.     Take 1 tablet (10 mg total) by mouth in the morning.     Chew 81 mg by mouth every morning.     Take 1 tablet (12.5 mg total) by mouth 2 (two) times daily.     3. Are you having a reaction (difficulty breathing--STAT)? No  4. What is your medication issue? Pt is requesting a callback from pharmd regarding her wanting to know if she can take all of these medication together. Please advise

## 2023-11-09 ENCOUNTER — Telehealth: Payer: Self-pay | Admitting: Pharmacist

## 2023-11-09 DIAGNOSIS — L603 Nail dystrophy: Secondary | ICD-10-CM | POA: Diagnosis not present

## 2023-11-09 NOTE — Telephone Encounter (Signed)
 Patient left a voicemail reporting that her blood glucose (BG) reading was elevated at 160 mg/dL and expressed feeling anxious about the result. I spoke with the patient and learned that she had dessert the night before, which likely contributed to the elevated BG level the following morning. We discussed healthier snack options and the importance of monitoring morning BG levels closely. The patient was advised to keep a food log to help identify patterns and better understand which foods may contribute to glucose spikes. She is in agreement with the plan. If BG levels remain elevated, we may consider increasing Jardiance  to 25 mg daily. Follow-up will be arranged as needed.

## 2023-11-12 DIAGNOSIS — I10 Essential (primary) hypertension: Secondary | ICD-10-CM | POA: Diagnosis not present

## 2023-11-13 ENCOUNTER — Ambulatory Visit: Payer: Self-pay | Admitting: Pharmacist

## 2023-11-13 LAB — BASIC METABOLIC PANEL WITH GFR
BUN/Creatinine Ratio: 12 (ref 12–28)
BUN: 18 mg/dL (ref 8–27)
CO2: 21 mmol/L (ref 20–29)
Calcium: 10.4 mg/dL — ABNORMAL HIGH (ref 8.7–10.3)
Chloride: 103 mmol/L (ref 96–106)
Creatinine, Ser: 1.51 mg/dL — AB (ref 0.57–1.00)
Glucose: 147 mg/dL — ABNORMAL HIGH (ref 70–99)
Potassium: 4.9 mmol/L (ref 3.5–5.2)
Sodium: 137 mmol/L (ref 134–144)
eGFR: 35 mL/min/{1.73_m2} — AB (ref >59)

## 2023-11-27 ENCOUNTER — Ambulatory Visit: Admitting: Physical Medicine and Rehabilitation

## 2023-11-27 ENCOUNTER — Encounter: Payer: Self-pay | Admitting: Physical Medicine and Rehabilitation

## 2023-11-27 DIAGNOSIS — M545 Low back pain, unspecified: Secondary | ICD-10-CM

## 2023-11-27 DIAGNOSIS — M47817 Spondylosis without myelopathy or radiculopathy, lumbosacral region: Secondary | ICD-10-CM | POA: Diagnosis not present

## 2023-11-27 DIAGNOSIS — G8929 Other chronic pain: Secondary | ICD-10-CM

## 2023-11-27 DIAGNOSIS — R202 Paresthesia of skin: Secondary | ICD-10-CM

## 2023-11-27 DIAGNOSIS — M47816 Spondylosis without myelopathy or radiculopathy, lumbar region: Secondary | ICD-10-CM | POA: Diagnosis not present

## 2023-11-27 NOTE — Progress Notes (Signed)
 Core Outcome Measures Index (COMI) Back Score  Average Pain 5  COMI Score 50 %

## 2023-11-27 NOTE — Progress Notes (Signed)
 Pain Scale   Average Pain 2 Patient advising she has lower back pain that radiates to her right leg causing numbness and tingling.  Patient advises that her pain starts when she is doing daily chores and she uses ice and heat to help control the pain        +Driver, -BT, -Dye Allergies.

## 2023-11-27 NOTE — Progress Notes (Signed)
 Katie [name] - 78 y.o. female MRN [id number]  Date of birth: [birth year]  Office Visit Note: Visit Date: 11/27/2023 PCP: Joshua Debby CROME, MD Referred by: Joshua Debby CROME, MD  Subjective: Chief Complaint  Katie [name] presents with   Lower Back - Pain   HPI: Katie [name] is a 78 y.o. female who comes in today per the request of Dr. Debby Joshua for evaluation of chronic, worsening and severe bilateral lower back pain, right greater than left. Chronic numbness and tinging to calf region down to foot. Katie [name] is somewhat of poor historian, family member at bedside. Pain ongoing for several years. States her pain started after Katie [name] was attacked by a dog and thrown onto her back. Her pain worsens with prolonged standing, states he does have to take breaks to sit when performing household tasks. Katie [name] describes her pain as sore, aching and numb, currently rates as 7 out of 10. Some relief of pain with home exercise regimen, rest and use of medications. Lumbar MRI imaging from 2024 shows advanced facet arthritis at L4-L5 and L5-S1. There is bilateral foraminal stenosis at L4-L5. Katie [name] was previously treated by Dr. Rockey Peru at [facility]. Katie [name] was evaluated by Dr. Peru in February of 2024, referred to Dr. Deatrice Manus and underwent lumbar epidural steroid injection. Minimal relief of pain with lumbar epidural steroid injection. Katie [name] denies focal weakness. No recent trauma or falls.      Review of Systems  Musculoskeletal:  Positive for back pain.  Neurological:  Positive for tingling. Negative for focal weakness and weakness.  All other systems reviewed and are negative.  Otherwise per HPI.  Assessment & Plan: Visit Diagnoses:    ICD-10-CM   1. Chronic bilateral low back pain without sciatica  M54.50    G89.29     2. Spondylosis without myelopathy or radiculopathy, lumbosacral region  M47.817     3. Facet arthropathy, lumbar  M47.816     4. Paresthesia of skin  R20.2         Plan: Findings:  Chronic, worsening and severe bilateral lower back pain, right greater than left. Chronic paresthesias to right lower leg radiating down to feet. Numbness to entire calf region and right foot. Katie [name] continues to have severe pain despite good conservative therapies such as home exercise regimen, rest and use of medications. Patients presentation and exam are complex, her pain does seem to be more facet mediated. It does seem the majority of her pain is localized to lower back region. The paresthesias to right lower leg are chronic, right lower extremity nerve study from 2018 was normal, no evidence of polyneuropathy/lumbosacral radiculopathy. I do not feel right lower extremity paresthesias are directly related to her lumbar spine. Would recommend Katie [name] speak with Dr. Joshua regarding neurology referral, could be a diabetic polyneuropathy, we have seen these symptoms more unilateral. I did offer Katie [name] diagnostic and hopefully therapeutic bilateral L4-L5 and L5-S1 facet joint injection, however Katie [name] would like some time to think about options. Could also look at short course of formal physical therapy. Katie [name] has no questions at this time. No red flag symptoms noted upon exam today.     Meds & Orders: No orders of the defined types were placed in this encounter.  No orders of the defined types were placed in this encounter.   Follow-up: Return if symptoms worsen or fail to improve.   Procedures: No procedures performed      Clinical History: CLINICAL DATA:  Low back  pain radiating into the left lower extremity for 6 weeks.   EXAM: MRI LUMBAR SPINE WITHOUT CONTRAST   TECHNIQUE: Multiplanar, multisequence MR imaging of the lumbar spine was performed. No intravenous contrast was administered.   COMPARISON:  10/04/2017   FINDINGS: Segmentation:  Standard.   Alignment:  Grade 1 anterolisthesis at L4-5 and L5-S1.   Vertebrae: No acute fracture, evidence of discitis, or bone  lesion. Remote L2 body fracture.   Conus medullaris and cauda equina: Conus extends to the L1 level. Conus and cauda equina appear normal.   Paraspinal and other soft tissues: Cholelithiasis. No perispinal mass or inflammation   Disc levels:   T12- L1: Mild disc height loss and bulging.  Mild facet spurring   L1-L2: Disc height loss with mild bulging.  No neural impingement   L2-L3: Disc height loss with bulging. Mild degenerative facet spurring.   L3-L4: Degenerative facet spurring on both sides. Mild disc bulging.   L4-L5: Advanced facet osteoarthritis with spurring and anterolisthesis. Circumferential disc bulging with central protrusion. Bilateral foraminal impingement greater on the left. Mild spinal stenosis   L5-S1:Advanced facet osteoarthritis with bulkier spurring on the right. Greatest level of degenerative disc narrowing with circumferential bulging and far-lateral spurring. Patent canal and foramina. Mild right subarticular recess narrowing.   IMPRESSION: 1. No significant change when compared to 2019. 2. Generalized lumbar spine degeneration especially affecting the L4-5 and L5-S1 facets where there is anterolisthesis. 3. L4-5 left more than right foraminal stenosis that could be symptomatic. 4. Diffusely patent spinal canal. 5. Incidental cholelithiasis.     Electronically Signed   By: Dorn Roulette M.D.   On: 06/12/2022 19:30   Katie [name] reports that Katie [name] has never smoked. Katie [name] has never used smokeless tobacco.  Recent Labs    06/04/23 1503 09/21/23 1031  HGBA1C 5.6 6.0*    Objective:  VS:  HT:    WT:   BMI:     BP:   HR: bpm  TEMP: ( )  RESP:  Physical Exam Vitals and nursing note reviewed.  HENT:     Head: Normocephalic and atraumatic.     Right Ear: External ear normal.     Left Ear: External ear normal.     Nose: Nose normal.     Mouth/Throat:     Mouth: Mucous membranes are moist.  Eyes:     Extraocular Movements: Extraocular  movements intact.  Cardiovascular:     Rate and Rhythm: Normal rate.     Pulses: Normal pulses.  Pulmonary:     Effort: Pulmonary effort is normal.  Abdominal:     General: Abdomen is flat. There is no distension.  Musculoskeletal:        General: Tenderness present.     Cervical back: Normal range of motion.     Comments: Katie [name] rises from seated position to standing without difficulty. Mild pain noted with facet loading and lumbar extension. 5/5 strength noted with bilateral hip flexion, knee flexion/extension, ankle dorsiflexion/plantarflexion and EHL. No clonus noted bilaterally. No pain upon palpation of greater trochanters. No pain with internal/external rotation of bilateral hips. Sensation intact bilaterally. Negative slump test bilaterally. Ambulates without aid, gait steady.     Skin:    General: Skin is warm and dry.     Capillary Refill: Capillary refill takes less than 2 seconds.  Neurological:     General: No focal deficit present.     Mental Status: Katie [name] is alert and oriented to person, place, and time.  Psychiatric:        Mood and Affect: Mood normal.        Behavior: Behavior normal.     Ortho Exam  Imaging: No results found.  Past Medical/Family/Surgical/Social History: Medications & Allergies reviewed per EMR, new medications updated. 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9 (see actual BMI) 09/20/2016   Spinal stenosis, lumbar region with neurogenic claudication 06/28/2016   Prediabetes 11/27/2012   Vitamin D  deficiency 03/05/2008   Hyperlipidemia with target LDL less than 100 11/16/2007   Hypothyroidism 04/03/2007   B12 deficiency 04/03/2007   GAD (generalized anxiety disorder) 04/03/2007   PSEUDOTUMOR CEREBRI 04/03/2007   IRITIS 04/03/2007   Essential hypertension 04/03/2007   Reflux esophagitis 04/03/2007   Constipation 04/03/2007   DEGENERATIVE DISC DISEASE, CERVICAL SPINE 04/03/2007   Past Medical History:  Diagnosis Date   Acute venous embolism and thrombosis of unspecified deep vessels of lower extremity 2001   RLE DVT and PE, coumadin x 61mo, now ASSA   Allergy    Anxiety state, unspecified    Blood transfusion without reported diagnosis    Cataract    Chronic idiopathic constipation 11/02/2016   Clotting disorder (HCC)    Degeneration of cervical intervertebral disc    Gallstones    History of DVT (deep vein thrombosis)    Iron deficiency anemia secondary to blood loss (chronic)    Irritable bowel syndrome    Lumbago    Other B-complex deficiencies    Personal history of colonic polyps    adenomatous   Pure hypercholesterolemia    Reflux esophagitis    Tubular adenoma of colon 09/2002   Type II or unspecified type diabetes mellitus without mention of complication, not stated as uncontrolled    Unspecified essential hypertension    Unspecified hypothyroidism    Unspecified iridocyclitis    Unspecified venous (peripheral) insufficiency    Unspecified vitamin D  deficiency     Family History  Problem Relation Age of Onset   Prostate cancer Father    Diabetes Mother        retinopathy/blind   Deep vein thrombosis Mother    Hypertension Mother    Healthy Sister    Colon cancer Neg Hx    Esophageal cancer Neg Hx    Stomach cancer Neg Hx    Rectal cancer Neg Hx    Past Surgical History:  Procedure Laterality Date   BIOPSY  04/24/2018   Procedure: BIOPSY;  Surgeon: Wilhelmenia Aloha Raddle., MD;  Location: [facility];  Service: Gastroenterology;;   CARPAL TUNNEL RELEASE     CATARACT EXTRACTION Bilateral 08/29/2017,09/19/17   ESOPHAGOGASTRODUODENOSCOPY (EGD) WITH PROPOFOL  N/A 04/24/2018  Procedure: ESOPHAGOGASTRODUODENOSCOPY (EGD) WITH PROPOFOL ;  Surgeon: Mansouraty, Aloha Raddle., MD;  Location: [facility];  Service: Gastroenterology;  Laterality: N/A;  with EMR   FEMORAL HERNIA REPAIR     OOPHORECTOMY     POLYPECTOMY  04/24/2018   Procedure: POLYPECTOMY;  Surgeon: Mansouraty, Aloha Raddle., MD;  Location: [facility];  Service: Gastroenterology;;   UPPER ESOPHAGEAL ENDOSCOPIC ULTRASOUND (EUS)  04/24/2018   Procedure: UPPER ESOPHAGEAL ENDOSCOPIC ULTRASOUND (EUS);  Surgeon: Wilhelmenia Aloha Raddle., MD;  Location: [facility];  Service: Gastroenterology;;   VESICOVAGINAL FISTULA CLOSURE W/ TAH     Social History   Occupational History   Occupation: record specialist    Employer: [employer]  Tobacco Use   Smoking status: Never   Smokeless tobacco: Never  Vaping Use   Vaping status: Never Used  Substance and Sexual Activity   Alcohol use: Yes    Alcohol/week: 0.0 standard drinks of alcohol    Comment: occasional-wine    Drug use: No   Sexual activity: Not Currently

## 2023-11-29 ENCOUNTER — Ambulatory Visit: Payer: Self-pay

## 2023-11-29 ENCOUNTER — Ambulatory Visit: Payer: Self-pay | Admitting: *Deleted

## 2023-11-29 NOTE — Telephone Encounter (Signed)
 FYI Only or Action Required?: Action required by provider: request for appointment.  Patient was last seen in primary care on 11/05/2023 by Katie Corean CROME, FNP.  Called Nurse Triage reporting Numbness.  Symptoms began several months ago.  Interventions attempted: Nothing.  Symptoms are: unchanged.  Triage Disposition: See HCP Within 4 Hours (Or PCP Triage)  Patient/caregiver understands and will follow disposition?: YesCopied from CRM [id number]. Topic: Clinical - Red Word Triage >> Nov 29, 2023  1:05 PM [name] wrote: [facility] that prompted transfer to Nurse Triage: Pt. Was prompted to contact NT to schedule appt. For neurological problem. Reason for Disposition  [1] Tingling (e.g., pins and needles) of the face, arm / hand, or leg / foot on one side of the body AND [2] present now  (Exceptions: Chronic/recurrent symptom lasting > 4 weeks; or from known cause, such as: bumped elbow, carpal tunnel, pinched nerve.)  Answer Assessment - Initial Assessment Questions 1. SYMPTOM: What is the main symptom you are concerned about? (e.g., weakness, numbness)     Numbness in right leg 2. ONSET: When did this start? (e.g., minutes, hours, days; while sleeping)     May  4. PATTERN Does this come and go, or has it been constant since it started?  Is it present now?     Constant  5. CARDIAC SYMPTOMS: Have you had any of the following symptoms: chest pain, difficulty breathing, palpitations?     denies 6. NEUROLOGIC SYMPTOMS: Have you had any of the following symptoms: headache, dizziness, vision loss, double vision, changes in speech, unsteady on your feet?    Unsteady on right foot  7. OTHER SYMPTOMS: Do you have any other symptoms?     Denies   Pt stated numbness started in may in right leg and right fingertips. Pt feels unsteady on right foot due numbness. Pt stated the  intensity hasn't changed and just needs this looked at. Pt has diabetes and concerned this is  neuropathy. Pt sounds stable and is negative for all stroke symptoms.  Protocols used: Neurologic Deficit-A-AH

## 2023-11-29 NOTE — Telephone Encounter (Signed)
   FYI Only or Action Required?: Action required by provider: request for appointment.  Patient was last seen in primary care on 11/05/2023 by Alvia Corean CROME, FNP.  Called Nurse Triage reporting Neurologic Problem.  Symptoms began today.  Interventions attempted: Other: unsure .  Symptoms are: gradually worsening.  Triage Disposition: See HCP Within 4 Hours (Or PCP Triage)  Patient/caregiver understands and will follow disposition?: Unsure please advise        Copied from CRM [id number]. Topic: Clinical - Red Word Triage >> Nov 29, 2023  8:56 AM Katie [name] wrote: Red Word that prompted transfer to Nurse Triage: off balance with her right leg and she does not know what is going on with the right side Reason for Disposition  [1] Weakness of the face, arm / hand, or leg / foot on one side of the body AND [2] gradual onset (e.g., days to weeks) AND [3] present now    Denies weakness but reports change in balance from previous times.  Answer Assessment - Initial Assessment Questions Patient reports balance off and has had right sided issues since June. Poor historian of sx. Recommended ED and patient reports sx not that bad. Offered patient appt with PCP this am and patient unable to make due to transportation needs. Reports balance off more than normal and right side different with balance. Denies weakness no N/T reports she has no issues using right side. Encouraged patient to go to ED and patient reports she has another appt at 10:30 am today. Instructed patient if sx worsen on right side balance worse any other neurological sx call 911. Patient reports she is going to see if she can get ride this afternoon and call back to schedule appt. Please advise. Can call cell # [phone].   CAL notified       1. SYMPTOM: What is the main symptom you are concerned about? (e.g., weakness, numbness)     Right side feels different and  off balance more than usual  2. ONSET: When did  this start? (e.g., minutes, hours, days; while sleeping)     June  3. LAST NORMAL: When was the last time you (the patient) were normal (no symptoms)?     Month June  4. PATTERN Does this come and go, or has it been constant since it started?  Is it present now?     Constant  5. CARDIAC SYMPTOMS: Have you had any of the following symptoms: chest pain, difficulty breathing, palpitations?     No  6. NEUROLOGIC SYMPTOMS: Have you had any of the following symptoms: headache, dizziness, vision loss, double vision, changes in speech, unsteady on your feet?     More unsteady  on feet  7. OTHER SYMPTOMS: Do you have any other symptoms?     Different than normal walk denies weakness, no N/T. No slurred speech, no headaches, no visual issues. No other sx reported  8. PREGNANCY: Is there any chance you are pregnant? When was your last menstrual period?     na  Protocols used: Neurologic Deficit-A-AH

## 2023-11-30 ENCOUNTER — Ambulatory Visit: Attending: Cardiovascular Disease | Admitting: Pharmacist

## 2023-11-30 ENCOUNTER — Ambulatory Visit: Admitting: Internal Medicine

## 2023-11-30 ENCOUNTER — Encounter: Payer: Self-pay | Admitting: Internal Medicine

## 2023-11-30 ENCOUNTER — Encounter: Payer: Self-pay | Admitting: Pharmacist

## 2023-11-30 VITALS — BP 110/80 | Temp 97.8°F | Ht 65.0 in | Wt 150.4 lb

## 2023-11-30 VITALS — BP 104/67 | HR 60

## 2023-11-30 DIAGNOSIS — I10 Essential (primary) hypertension: Secondary | ICD-10-CM

## 2023-11-30 DIAGNOSIS — Z7984 Long term (current) use of oral hypoglycemic drugs: Secondary | ICD-10-CM

## 2023-11-30 DIAGNOSIS — M5416 Radiculopathy, lumbar region: Secondary | ICD-10-CM | POA: Diagnosis not present

## 2023-11-30 DIAGNOSIS — E559 Vitamin D deficiency, unspecified: Secondary | ICD-10-CM | POA: Diagnosis not present

## 2023-11-30 DIAGNOSIS — N1831 Chronic kidney disease, stage 3a: Secondary | ICD-10-CM

## 2023-11-30 DIAGNOSIS — E1122 Type 2 diabetes mellitus with diabetic chronic kidney disease: Secondary | ICD-10-CM | POA: Diagnosis not present

## 2023-11-30 NOTE — Patient Instructions (Signed)
 Please use the cane with walking at least outside of the home  Please continue all other medications as before, and refills have been done if requested.  Please have the pharmacy call with any other refills you may need.  Please keep your appointments with your specialists as you may have planned  You will be contacted regarding the referral for: MRI for the lower back, and refer to Dr Lindalee    .

## 2023-11-30 NOTE — Assessment & Plan Note (Signed)
 Last vitamin D  Lab Results  Component Value Date   VD25OH 92.49 12/30/2018   Stable, cont oral replacement d

## 2023-11-30 NOTE — Progress Notes (Signed)
 Patient ID: Katie [name], female   DOB: June 26, 1945, 78 y.o.   MRN: [id number]        Chief Complaint: follow up gait disorder with right leg numb and weakness > 3 mo, ckd3a, low vit d, dm       HPI:  Katie [name] is a 78 y.o. 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  Also mentions right hand feeling cool.  Did have lumbar ESI she thinks in Feb 2025 but does not think helped very much.  Pt denies chest pain, increased sob or doe, wheezing, orthopnea, PND, increased LE swelling, palpitations, dizziness or syncope.   Pt denies polydipsia, polyuria.  Also has hx of DM neuropathy, CKD3a       Wt Readings from Last 3 Encounters:  11/30/23 150 lb 6 oz (68.2 kg)  11/05/23 154 lb 6.4 oz (70 kg)  09/26/23 154 lb (69.9 kg)   BP Readings from Last 3 Encounters:  11/30/23 110/80  11/05/23 108/60  10/31/23 112/68         Past Medical History:  Diagnosis Date   Acute venous embolism and thrombosis of unspecified deep vessels of lower extremity 2001   RLE DVT and PE, coumadin x 49mo, now ASSA   Allergy    Anxiety state, unspecified    Blood transfusion without reported diagnosis    Cataract    Chronic idiopathic constipation 11/02/2016   Clotting disorder (HCC)    Degeneration of cervical intervertebral disc    Gallstones    History of DVT (deep vein thrombosis)    Iron deficiency anemia secondary to blood loss (chronic)    Irritable bowel syndrome    Lumbago    Other B-complex deficiencies    Personal history of colonic polyps    adenomatous   Pure hypercholesterolemia    Reflux esophagitis    Tubular adenoma of colon 09/2002   Type II or unspecified type diabetes mellitus without mention of complication, not  stated as uncontrolled    Unspecified essential hypertension    Unspecified hypothyroidism    Unspecified iridocyclitis    Unspecified venous (peripheral) insufficiency    Unspecified vitamin D  deficiency    Past Surgical History:  Procedure Laterality Date   BIOPSY  04/24/2018   Procedure: BIOPSY;  Surgeon: Wilhelmenia Aloha Raddle., MD;  Location: [facility];  Service: Gastroenterology;;   ORIN MEDIATE RELEASE     CATARACT EXTRACTION Bilateral 08/29/2017,09/19/17   ESOPHAGOGASTRODUODENOSCOPY (EGD) WITH PROPOFOL  N/A 04/24/2018   Procedure: ESOPHAGOGASTRODUODENOSCOPY (EGD) WITH PROPOFOL ;  Surgeon: Wilhelmenia Aloha Raddle., MD;  Location: [facility];  Service: Gastroenterology;  Laterality: N/A;  with EMR   FEMORAL HERNIA REPAIR     OOPHORECTOMY     POLYPECTOMY  04/24/2018   Procedure: POLYPECTOMY;  Surgeon: Mansouraty, Aloha Raddle., MD;  Location: [facility];  Service: Gastroenterology;;   UPPER ESOPHAGEAL ENDOSCOPIC ULTRASOUND (EUS)  04/24/2018   Procedure: UPPER ESOPHAGEAL ENDOSCOPIC ULTRASOUND (EUS);  Surgeon: Wilhelmenia Aloha Raddle., MD;  Location: [facility];  Service: Gastroenterology;;   VESICOVAGINAL FISTULA CLOSURE W/ TAH      reports that she has never smoked. She has never used smokeless tobacco. She reports current alcohol use. She reports that she does not use drugs. family history includes  Deep vein thrombosis in her mother; Diabetes in her mother; Healthy in her sister; Hypertension in her mother; Prostate cancer in her father. Allergies  Allergen Reactions   Lisinopril  Other (See Comments) and Cough    Cough and throat clearing   Metoprolol Succinate Other (See Comments)    Unknown reaction   Penicillins Swelling    Has patient had a PCN reaction causing immediate rash, facial/tongue/throat swelling, SOB or lightheadedness with hypotension: Yes Has patient had a PCN reaction causing severe rash involving mucus membranes or skin necrosis: No Has patient had a PCN  reaction that required hospitalization: No Has patient had a PCN reaction occurring within the last 10 years: No If all of the above answers are NO, then may proceed with Cephalosporin use.    Sulfamethoxazole Other (See Comments)    Unknown reaction   Sulfamethoxazole-Trimethoprim Other (See Comments)    Unknown reaction   Trimethoprim Other (See Comments)    Unknown reaction   Metformin Nausea Only   Current Outpatient Medications on File Prior to Visit  Medication Sig Dispense Refill   ACCU-CHEK GUIDE TEST test strip SMARTSIG:Strip(s)     ALPRAZolam  (XANAX ) 0.5 MG tablet TAKE 1 TABLET BY MOUTH THREE TIMES DAILY AS NEEDED FOR ANXIETY OR SLEEP 270 tablet 0   aspirin  81 MG chewable tablet Chew 81 mg by mouth every morning.     carvedilol  (COREG ) 12.5 MG tablet Take 1 tablet (12.5 mg total) by mouth 2 (two) times daily. 180 tablet 3   Cholecalciferol  50 MCG (2000 UT) TABS Take 1 tablet (2,000 Units total) by mouth daily. 90 tablet 1   cyanocobalamin  (VITAMIN B12) 1000 MCG/ML injection INJECT 1 ML  INTRAMUSCULARLY ONCE EVERY MONTH 3 mL 0   empagliflozin  (JARDIANCE ) 10 MG TABS tablet Take 1 tablet (10 mg total) by mouth in the morning.     famotidine  (PEPCID ) 20 MG tablet Take 2 tablets by mouth once daily (Patient taking differently: Take 20 mg by mouth daily.) 180 tablet 0   levothyroxine  (SYNTHROID ) 88 MCG tablet Take 1 tablet (88 mcg total) by mouth daily before breakfast. 90 tablet 3   magnesium hydroxide (DULCOLAX) 400 MG/5ML suspension Take 15 mLs by mouth daily as needed for mild constipation.     MAXITROL 3.5-10000-0.1 OINT Place 1 Application into the left eye 3 (three) times daily.     mirtazapine  (REMERON  SOL-TAB) 15 MG disintegrating tablet Take 15 mg by mouth at bedtime as needed.     Omega-3 Fatty Acids (FISH OIL PO) Take 1,400 mg by mouth daily.     Pharmacist Choice Lancets MISC      Polyethyl Glycol-Propyl Glycol (SYSTANE) 0.4-0.3 % SOLN Place 1 drop into both eyes daily  as needed (dry eyes).     rosuvastatin  (CRESTOR ) 10 MG tablet TAKE 1 TABLET BY MOUTH IN THE  MORNING 90 tablet 3   sacubitril-valsartan (ENTRESTO ) 24-26 MG Take 1 tablet by mouth 2 (two) times daily. 60 tablet 11   spironolactone  (ALDACTONE ) 25 MG tablet Take 1 tablet (25 mg total) by mouth in the morning. 90 tablet 3   traZODone  (DESYREL ) 50 MG tablet Take 1 tablet (50 mg total) by mouth at bedtime. 90 tablet 0   No current facility-administered medications on file prior to visit.        ROS:  All others reviewed and negative.  Objective        PE:  BP 110/80   Temp 97.8 F (36.6 C) (Temporal)   Ht 5'  5 (1.651 m)   Wt 150 lb 6 oz (68.2 kg)   BMI 25.02 kg/m                 Constitutional: Pt appears in NAD               HENT: Head: NCAT.                Right Ear: External ear normal.                 Left Ear: External ear normal.                Eyes: . Pupils are equal, round, and reactive to light. Conjunctivae and EOM are normal               Nose: without d/c or deformity               Neck: Neck supple. Gross normal ROM               Cardiovascular: Normal rate and regular rhythm.                 Pulmonary/Chest: Effort normal and breath sounds without rales or wheezing.                Abd:  Soft, NT, ND, + BS, no organomegaly               Neurological: Pt is alert. At baseline orientation, motor with 4/5 RLE weakness noted and off balance with walking, unable to step up on exam table with right leg               Skin: Skin is warm. No rashes, no other new lesions, LE edema - none               Psychiatric: Pt behavior is normal without agitation   Micro: none  Cardiac tracings I have personally interpreted today:  none  Pertinent Radiological findings (summarize): none   Lab Results  Component Value Date   WBC 4.8 07/09/2023   HGB 13.6 07/09/2023   HCT 38.6 07/09/2023   PLT 186 07/09/2023   GLUCOSE 147 (H) 11/12/2023   CHOL 103 09/05/2023   TRIG 55.0 09/05/2023    HDL 65.10 09/05/2023   LDLDIRECT 105.9 07/04/2007   LDLCALC 26 09/05/2023   ALT 16 07/09/2023   AST 23 07/09/2023   NA 137 11/12/2023   K 4.9 11/12/2023   CL 103 11/12/2023   CREATININE 1.51 (H) 11/12/2023   BUN 18 11/12/2023   CO2 21 11/12/2023   TSH 0.81 09/05/2023   HGBA1C 6.0 (A) 09/21/2023   MICROALBUR 0.0 05/18/2017   Assessment/Plan:  Argie Lober is a 78 y.o. Black or African American [2] female with  has a past medical history of Acute venous embolism and thrombosis of unspecified deep vessels of lower extremity (2001), Allergy, Anxiety state, unspecified, Blood transfusion without reported diagnosis, Cataract, Chronic idiopathic constipation (11/02/2016), Clotting disorder (HCC), Degeneration of cervical intervertebral disc, Gallstones, History of DVT (deep vein thrombosis), Iron deficiency anemia secondary to blood loss (chronic), Irritable bowel syndrome, Lumbago, Other B-complex deficiencies, Personal history of colonic polyps, Pure hypercholesterolemia, Reflux esophagitis, Tubular adenoma of colon (09/2002), Type II or unspecified type diabetes mellitus without mention of complication, not stated as uncontrolled, Unspecified essential hypertension, Unspecified hypothyroidism, Unspecified iridocyclitis, Unspecified venous (peripheral) insufficiency, and Unspecified vitamin D  deficiency.  Vitamin D  deficiency Last vitamin D  Lab Results  Component  Value Date   VD25OH 92.49 12/30/2018   Stable, cont oral replacement d  Type 2 diabetes mellitus with stage 3a chronic kidney disease, without long-term current use of insulin (HCC) Lab Results  Component Value Date   HGBA1C 6.0 (A) 09/21/2023   Stable, pt to continue current medical treatment jardiance  10 every day,    Right lumbar radiculopathy Mild to mod, declines pain control, but for MRI LS Spine repeat due to recent worsening exam, and refer back to NS Dr Lindalee, pt to f/u any worsening symptoms or  concerns  Followup: Return if symptoms worsen or fail to improve.  Lynwood Rush, MD 11/30/2023 9:44 AM [facility]

## 2023-11-30 NOTE — Patient Instructions (Signed)
 No Changes made by your pharmacist Nickola Baron, PharmD at today's visit:   Bring all of your meds, your BP cuff and your record of home blood pressures to your next appointment.    HOW TO TAKE YOUR BLOOD PRESSURE AT HOME  Rest 5 minutes before taking your blood pressure.  Don't smoke or drink caffeinated beverages for at least 30 minutes before. Take your blood pressure before (not after) you eat. Sit comfortably with your back supported and both feet on the floor (don't cross your legs). Elevate your arm to heart level on a table or a desk. Use the proper sized cuff. It should fit smoothly and snugly around your bare upper arm. There should be enough room to slip a fingertip under the cuff. The bottom edge of the cuff should be 1 inch above the crease of the elbow. Ideally, take 3 measurements at one sitting and record the average.  Important lifestyle changes to control high blood pressure  Intervention  Effect on the BP  Lose extra pounds and watch your waistline Weight loss is one of the most effective lifestyle changes for controlling blood pressure. If you're overweight or obese, losing even a small amount of weight can help reduce blood pressure. Blood pressure might go down by about 1 millimeter of mercury (mm Hg) with each kilogram (about 2.2 pounds) of weight lost.  Exercise regularly As a general goal, aim for at least 30 minutes of moderate physical activity every day. Regular physical activity can lower high blood pressure by about 5 to 8 mm Hg.  Eat a healthy diet Eating a diet rich in whole grains, fruits, vegetables, and low-fat dairy products and low in saturated fat and cholesterol. A healthy diet can lower high blood pressure by up to 11 mm Hg.  Reduce salt (sodium) in your diet Even a small reduction of sodium in the diet can improve heart health and reduce high blood pressure by about 5 to 6 mm Hg.  Limit alcohol One drink equals 12 ounces of beer, 5 ounces of wine,  or 1.5 ounces of 80-proof liquor.  Limiting alcohol to less than one drink a day for women or two drinks a day for men can help lower blood pressure by about 4 mm Hg.   If you have any questions or concerns please use My Chart to send questions or call the office at [phone]

## 2023-11-30 NOTE — Progress Notes (Signed)
 Patient ID: Katie [name]                 DOB: 22-Jun-1945                      MRN: [id number]     HPI: Katie [name] is a 78 y.o. female referred by Dr. Michele to pharmacy clinic for HF medication management. PMH is significant for Cardiomyopathy, History of DVT, GERD, hypertension chronic kidney disease stage, acquired hypothyroidism, hyperlipidemia, prediabetes, advanced age . Most recent LVEF 20-25% on 04/26/23. Echo 1/22 EF 59% Lexiscan  2/22: EF 42% normal perfusion Echo 3/23 EF 45-50% Echo 9/24 EF 20-25%   Patient is being followed by the practice given her valvular heart disease.  Serial echocardiograms started to illustrate reduction in LVEF.  Given the cardiomyopathy she was recommended to undergo ischemic workup.  However since she was asymptomatic she did not want to undergo MPI or coronary CTA in the past.   Since her last office visit patient was seen in October 2024 by Orren Fabry -during which time cardiac MRI was ordered and she was referred to advanced heart failure to establish care.  GDMT was uptitrated.  Of note Entresto  was not refilled as it was cost prohibitive.   She saw Dr. Rolan Fuel in October 2024 as well as January 2025.  Cardiac MRI was performed in the interim which noted moderately reduced LVEF results noted below for further reference.  It was recommended that she continue with GDMT to maximally tolerated doses.  At last visit with Dr.Tolia spironolactone  dose were increased to 25 mg daily and hydralazine  dose were reduced from 25 mg three times daily to 25 mg twice daily. F/u BMP K level was elevated Lokelma  was prescribed and k level returned to normal (suspected lab error since pt's K level historically always WNL). Enrolled in the grant for Jardiance .   At last visit with me carvedilol  dose was increased and hydralazine  was d/c'd patient home and office BP was controlled.reported she follows low salt diet and goes to gym 3 times per week. Able to perform ADLs.  Denies  LEE, PND, or orthopnea. Has anxiety problem she takes alprazolam  prn. Try to minimize use of it. She is on Trazodone  prn at bedtime. Her mood is improving and she is less anxious then before, therapy sessions are helping.  At last visit to optimize heart failure management, the patient agreed to switch from losartan  to low-dose Entresto .  She was informed that she has been enrolled in a grant program, which will reduce the cost of Entresto  to $0.  Given her history of elevated potassium (previously up to 5.5), the dose of spironolactone  will be reduced to 12.5 mg daily to minimize the risk of hyperkalemia. Patient presented today accompanied by her friend. She reports home blood pressure readings ranging from 98-110 systolic and 61-76 diastolic. She denies any dizziness or headaches and states she feels well overall. She reports taking her medications regularly. Since starting trazodone , she no longer wakes up with anxiety or low mood.  She is currently experiencing numbness in her right lower leg. She saw her PCP, who plans to order an MRI to evaluate the cause and guide appropriate treatment.  She continues to follow a low-salt diet but has not yet started an exercise routine. She expresses intent to begin exercising soon.   Current CHF meds: Jardiance  10 mg daily,  carvedilol  12.5 mg twice daily, Entresto  24/26 mg twice daily , spironolactone   12.5 mg every morning    Previously tried:  Adherence Assessment  Do you ever forget to take your medication? Uses pill box  [] Yes [x] No  Do you ever skip doses due to side effects? [] Yes [x] No  Do you have trouble affording your medicines? [] Yes [x] No  Are you ever unable to pick up your medication due to transportation difficulties? [] Yes [x] No  Do you ever stop taking your medications because you don't believe they are helping? [] Yes [x] No  Do you check your weight daily? [] Yes [x] No   Adherence strategy: pill box   Barriers to  obtaining medications: none - enrolled in the grant for brand name medications   BP goal: <130/80   Family History: Relation Problem Comments  Mother (Deceased at age 92) Deep vein thrombosis   Diabetes retinopathy/blind  Hypertension     Father (Deceased at age 17) Prostate cancer      Social History:  Alcohol: none  Smoking: never   Diet: low salt diet. Does not add slat to her home cooked meals only eats out occasionally.   Exercise: 30 min walks and some resistance at gym -2 times per week planning to go to 3-4 times per week   Home BP readings: ~ 120/69-72 heart rate ~80  Wt Readings from Last 3 Encounters:  11/30/23 150 lb 6 oz (68.2 kg)  11/05/23 154 lb 6.4 oz (70 kg)  09/26/23 154 lb (69.9 kg)   BP Readings from Last 3 Encounters:  11/30/23 104/67  11/30/23 110/80  11/05/23 108/60   Pulse Readings from Last 3 Encounters:  11/30/23 60  11/05/23 70  10/31/23 75    Renal function: Estimated Creatinine Clearance: 27.6 mL/min (A) (by C-G formula based on SCr of 1.51 mg/dL (H)).  Past Medical History:  Diagnosis Date   Acute venous embolism and thrombosis of unspecified deep vessels of lower extremity 2001   RLE DVT and PE, coumadin x 50mo, now ASSA   Allergy    Anxiety state, unspecified    Blood transfusion without reported diagnosis    Cataract    Chronic idiopathic constipation 11/02/2016   Clotting disorder (HCC)    Degeneration of cervical intervertebral disc    Gallstones    History of DVT (deep vein thrombosis)    Iron deficiency anemia secondary to blood loss (chronic)    Irritable bowel syndrome    Lumbago    Other B-complex deficiencies    Personal history of colonic polyps    adenomatous   Pure hypercholesterolemia    Reflux esophagitis    Tubular adenoma of colon 09/2002   Type II or unspecified type diabetes mellitus without mention of complication, not stated as uncontrolled    Unspecified essential hypertension    Unspecified  hypothyroidism    Unspecified iridocyclitis    Unspecified venous (peripheral) insufficiency    Unspecified vitamin D  deficiency     Current Outpatient Medications on File Prior to Visit  Medication Sig Dispense Refill   ACCU-CHEK GUIDE TEST test strip SMARTSIG:Strip(s)     ALPRAZolam  (XANAX ) 0.5 MG tablet TAKE 1 TABLET BY MOUTH THREE TIMES DAILY AS NEEDED FOR ANXIETY OR SLEEP 270 tablet 0   aspirin  81 MG chewable tablet Chew 81 mg by mouth every morning.     carvedilol  (COREG ) 12.5 MG tablet Take 1 tablet (12.5 mg total) by mouth 2 (two) times daily. 180 tablet 3   Cholecalciferol  50 MCG (2000 UT) TABS Take 1 tablet (2,000 Units total) by mouth daily. 90 tablet  1   cyanocobalamin  (VITAMIN B12) 1000 MCG/ML injection INJECT 1 ML  INTRAMUSCULARLY ONCE EVERY MONTH 3 mL 0   empagliflozin  (JARDIANCE ) 10 MG TABS tablet Take 1 tablet (10 mg total) by mouth in the morning.     famotidine  (PEPCID ) 20 MG tablet Take 2 tablets by mouth once daily (Patient taking differently: Take 20 mg by mouth daily.) 180 tablet 0   levothyroxine  (SYNTHROID ) 88 MCG tablet Take 1 tablet (88 mcg total) by mouth daily before breakfast. 90 tablet 3   magnesium hydroxide (DULCOLAX) 400 MG/5ML suspension Take 15 mLs by mouth daily as needed for mild constipation.     MAXITROL 3.5-10000-0.1 OINT Place 1 Application into the left eye 3 (three) times daily.     mirtazapine  (REMERON  SOL-TAB) 15 MG disintegrating tablet Take 15 mg by mouth at bedtime as needed.     Omega-3 Fatty Acids (FISH OIL PO) Take 1,400 mg by mouth daily.     Pharmacist Choice Lancets MISC      Polyethyl Glycol-Propyl Glycol (SYSTANE) 0.4-0.3 % SOLN Place 1 drop into both eyes daily as needed (dry eyes).     rosuvastatin  (CRESTOR ) 10 MG tablet TAKE 1 TABLET BY MOUTH IN THE  MORNING 90 tablet 3   sacubitril-valsartan (ENTRESTO ) 24-26 MG Take 1 tablet by mouth 2 (two) times daily. 60 tablet 11   spironolactone  (ALDACTONE ) 25 MG tablet Take 1 tablet (25 mg  total) by mouth in the morning. 90 tablet 3   traZODone  (DESYREL ) 50 MG tablet Take 1 tablet (50 mg total) by mouth at bedtime. 90 tablet 0   No current facility-administered medications on file prior to visit.    Allergies  Allergen Reactions   Lisinopril  Other (See Comments) and Cough    Cough and throat clearing   Metoprolol Succinate Other (See Comments)    Unknown reaction   Penicillins Swelling    Has patient had a PCN reaction causing immediate rash, facial/tongue/throat swelling, SOB or lightheadedness with hypotension: Yes Has patient had a PCN reaction causing severe rash involving mucus membranes or skin necrosis: No Has patient had a PCN reaction that required hospitalization: No Has patient had a PCN reaction occurring within the last 10 years: No If all of the above answers are NO, then may proceed with Cephalosporin use.    Sulfamethoxazole Other (See Comments)    Unknown reaction   Sulfamethoxazole-Trimethoprim Other (See Comments)    Unknown reaction   Trimethoprim Other (See Comments)    Unknown reaction   Metformin Nausea Only     Assessment/Plan:  1. CHF -  Essential hypertension Assessment: BP is controlled in office BP 104/67 mmHg heart rate 60 (goal<130/80). Last EF 20-25% on 04/26/23.  Tolerates all HF/BP medications well without any side effects Denies SOB, palpitation, chest pain, headaches,or swelling Able to perform ADLs. Denies  LEE, PND, or orthopnea. Appetite has been less Follows low salt diet and will be restarting gym                                                             Plan:   Continue taking  Entresto  24/ 26 twice daily and spironolactone  12.5 mg daily Jardiance  10 mg daily,carvedilol  12.5 mg twice daily  Patient to keep record of BP readings with heart rate and  report to us  at the next visit Patient to see PharmD in 8 weeks for follow up  Follow up oja:wnwz   Essential hypertension Overview: Estimated Creatinine Clearance:  40.9 mL/min (A) (by C-G formula based on SCr of 1.3 mg/dL (A)).  Estimated Creatinine Clearance: 41 mL/min (by C-G formula based on SCr of 1.15 mg/dL).   Assessment & Plan: Assessment: BP is controlled in office BP 104/67 mmHg heart rate 60 (goal<130/80). Last EF 20-25% on 04/26/23.  Tolerates all HF/BP medications well without any side effects Denies SOB, palpitation, chest pain, headaches,or swelling Able to perform ADLs. Denies  LEE, PND, or orthopnea. Appetite has been less Follows low salt diet and will be restarting gym                                                             Plan:   Continue taking  Entresto  24/ 26 twice daily and spironolactone  12.5 mg daily Jardiance  10 mg daily,carvedilol  12.5 mg twice daily  Patient to keep record of BP readings with heart rate and report to us  at the next visit Patient to see PharmD in 8 weeks for follow up  Follow up oja:wnwz         Thank you   Robbi Blanch, Pharm.D [facility] A Division of [facility] [address], [place], [place] [address]  Phone: [phone]; Fax: [phone]

## 2023-11-30 NOTE — Assessment & Plan Note (Signed)
 Lab Results  Component Value Date   HGBA1C 6.0 (A) 09/21/2023   Stable, pt to continue current medical treatment jardiance  10 every day,

## 2023-11-30 NOTE — Assessment & Plan Note (Signed)
 Assessment: BP is controlled in office BP 104/67 mmHg heart rate 60 (goal<130/80). Last EF 20-25% on 04/26/23.  Tolerates all HF/BP medications well without any side effects Denies SOB, palpitation, chest pain, headaches,or swelling Able to perform ADLs. Denies  LEE, PND, or orthopnea. Appetite has been less Follows low salt diet and will be restarting gym                                                             Plan:   Continue taking  Entresto  24/ 26 twice daily and spironolactone  12.5 mg daily Jardiance  10 mg daily,carvedilol  12.5 mg twice daily  Patient to keep record of BP readings with heart rate and report to us  at the next visit Patient to see PharmD in 8 weeks for follow up  Follow up oja:wnwz

## 2023-11-30 NOTE — Assessment & Plan Note (Signed)
 Mild to mod, declines pain control, but for MRI LS Spine repeat due to recent worsening exam, and refer back to NS Dr Lindalee, pt to f/u any worsening symptoms or concerns

## 2023-12-03 ENCOUNTER — Other Ambulatory Visit (HOSPITAL_COMMUNITY): Payer: Medicare Other

## 2023-12-04 ENCOUNTER — Telehealth: Payer: Self-pay | Admitting: Cardiology

## 2023-12-04 ENCOUNTER — Ambulatory Visit: Payer: Medicare Other | Admitting: Internal Medicine

## 2023-12-04 NOTE — Telephone Encounter (Signed)
 Call transferred into triage. Pt reports she is nervous getting such a low BP 72/57 -  BP while on the phone: 91/59, 79 Ate cereal w peaches after 11:00 am today and that has been all. Has salad and spaghetti for dinner - will add salt to her meal at this time. Feels she has been drinking adequately.  No diarrhea or vomiting.  No noted active bleeding.   Adv to increase fluids this evening and hold Entresto , carvedilol  and spironolactone  this evening.    Adv to call or MyChart in the morning w her blood pressure.  Adv to use caution w position changes because of risk of falls if BP changes.  She is careful because she is off balance anyway - not new.

## 2023-12-04 NOTE — Telephone Encounter (Signed)
 Appropriate.  If focal defects, near syncope, or syncope should go to ER for evaluation.  Hold if systolic blood pressure (top number) less than 100 mmHg or pulse less than 60 bpm (holding parameters for her HF meds).   Orazio Weller [place], DO, FACC

## 2023-12-04 NOTE — Telephone Encounter (Signed)
 Pt c/o BP issue: STAT if pt c/o blurred vision, one-sided weakness or slurred speech.  1. What is your BP concern?  BP is very low.  2. Have you taken any BP medication today? Yes   3. What are your last 5 BP readings? 72/57 78  4. Are you having any other symptoms (ex. Dizziness, headache, blurred vision, passed out)?   Tiredness, low BP, right hand numbness

## 2023-12-05 NOTE — Telephone Encounter (Signed)
 Spoke with pt and explained Dr. Tyree response to the original recommendations as well and that he agreed to them. Explained the importance of ED evaluation if focal defects or syncope occurred. Pt asked when she was to see Dr. Michele again and per last OV note, 6 month f/u with APP and 1 yr with Dr. Michele. The 6 month APP appt is due now. Appointments offered to pt and she stated she will call us  back when she looks at her calendar.

## 2023-12-07 ENCOUNTER — Ambulatory Visit
Admission: RE | Admit: 2023-12-07 | Discharge: 2023-12-07 | Disposition: A | Discharge status: Home / Self Care | Source: Ambulatory Visit | Attending: Internal Medicine | Admitting: Internal Medicine

## 2023-12-07 DIAGNOSIS — M4726 Other spondylosis with radiculopathy, lumbar region: Secondary | ICD-10-CM | POA: Diagnosis not present

## 2023-12-07 DIAGNOSIS — M48061 Spinal stenosis, lumbar region without neurogenic claudication: Secondary | ICD-10-CM | POA: Diagnosis not present

## 2023-12-07 DIAGNOSIS — M5416 Radiculopathy, lumbar region: Secondary | ICD-10-CM

## 2023-12-10 ENCOUNTER — Ambulatory Visit: Payer: Self-pay | Admitting: Internal Medicine

## 2023-12-10 DIAGNOSIS — H6123 Impacted cerumen, bilateral: Secondary | ICD-10-CM | POA: Diagnosis not present

## 2023-12-10 DIAGNOSIS — H9312 Tinnitus, left ear: Secondary | ICD-10-CM | POA: Diagnosis not present

## 2023-12-10 DIAGNOSIS — R490 Dysphonia: Secondary | ICD-10-CM | POA: Diagnosis not present

## 2023-12-12 NOTE — Telephone Encounter (Signed)
 Called patient back about Dr. Tyree advisement. Patient denies any fever, chills, or UTI symptoms (frequent urination, pain when urinating, confusion, etc.). Patient will call her PCP to see if she can see them sooner. Will send message to PharmD about getting a sooner appointment.

## 2023-12-12 NOTE — Telephone Encounter (Signed)
 Pt calling in stating her bp was 95/68 today, does she need to take her medication or no.

## 2023-12-12 NOTE — Telephone Encounter (Signed)
 Agree.  Inform PharmD as well - her GDMT was changed in June they could see her sooner as well.  Also make sure she not infected - do not want to miss SIRS/sepsis. Ask about UTI symptoms, fevers, chills, etc.   Madonna Large, DO, FACC

## 2023-12-12 NOTE — Telephone Encounter (Signed)
 Called patient back about her message. Patient stated she was told to hold her entresto  and coreg  if her BP was low. Informed patient that her BP is too low to take these medications at 95/68, HR 80's. Patient requested an appointment with Dr. Michele. Made patient an appointment next available with Dr. Michele. Encouraged patient to continue taking her BP and HR before she takes her medications, and if her BP is below 110/60 or HR below 60 to hold medication and give our office a call. Patient verbalized understanding and will send message to Dr. Michele and his nurse.

## 2023-12-18 DIAGNOSIS — M5416 Radiculopathy, lumbar region: Secondary | ICD-10-CM | POA: Diagnosis not present

## 2023-12-18 DIAGNOSIS — M4316 Spondylolisthesis, lumbar region: Secondary | ICD-10-CM | POA: Diagnosis not present

## 2023-12-19 NOTE — Progress Notes (Unsigned)
 Patient ID: Katie [name]                 DOB: 1945/12/08                      MRN: [id number]     HPI: Katie [name] is a 78 y.o. female referred by Dr. Michele to pharmacy clinic for HF medication management. PMH is significant for Cardiomyopathy, History of DVT, GERD, hypertension chronic kidney disease stage, acquired hypothyroidism, hyperlipidemia, prediabetes, advanced age . Most recent LVEF 20-25% on 04/26/23. Echo 1/22 EF 59% Lexiscan  2/22: EF 42% normal perfusion Echo 3/23 EF 45-50% Echo 9/24 EF 20-25%   Patient is being followed by the practice given her valvular heart disease.  Serial echocardiograms started to illustrate reduction in LVEF.  Given the cardiomyopathy she was recommended to undergo ischemic workup.  However since she was asymptomatic she did not want to undergo MPI or coronary CTA in the past.   Since her last office visit patient was seen in October 2024 by Orren Fabry -during which time cardiac MRI was ordered and she was referred to advanced heart failure to establish care.  GDMT was uptitrated.  Of note Entresto  was not refilled as it was cost prohibitive.   She saw Dr. Rolan Fuel in October 2024 as well as January 2025.  Cardiac MRI was performed in the interim which noted moderately reduced LVEF results noted below for further reference.  It was recommended that she continue with GDMT to maximally tolerated doses.  At last visit with Dr.Tolia spironolactone  dose were increased to 25 mg daily and hydralazine  dose were reduced from 25 mg three times daily to 25 mg twice daily. F/u BMP K level was elevated Lokelma  was prescribed and k level returned to normal (suspected lab error since pt's K level historically always WNL). Enrolled in the grant for Jardiance .   At last visit with me carvedilol  dose was increased and hydralazine  was d/c'd patient home and office BP was controlled.reported she follows low salt diet and goes to gym 3 times per week. Able to perform ADLs.  Denies  LEE, PND, or orthopnea. Has anxiety problem she takes alprazolam  prn. Try to minimize use of it. She is on Trazodone  prn at bedtime. Her mood is improving and she is less anxious then before, therapy sessions are helping.  At last visit to optimize heart failure management, the patient agreed to switch from losartan  to low-dose Entresto .  She was informed that she has been enrolled in a grant program, which will reduce the cost of Entresto  to $0.  Given her history of elevated potassium (previously up to 5.5), the dose of spironolactone  will be reduced to 12.5 mg daily to minimize the risk of hyperkalemia. Patient presented today accompanied by her friend. She reports home blood pressure readings ranging from 98-110 systolic and 61-76 diastolic. She denies any dizziness or headaches and states she feels well overall. She reports taking her medications regularly. Since starting trazodone , she no longer wakes up with anxiety or low mood.  She is currently experiencing numbness in her right lower leg. She saw her PCP, who plans to order an MRI to evaluate the cause and guide appropriate treatment.  She continues to follow a low-salt diet but has not yet started an exercise routine. She expresses intent to begin exercising soon.   Current CHF meds: Jardiance  10 mg daily,  carvedilol  12.5 mg twice daily, Entresto  24/26 mg twice daily , spironolactone   12.5 mg every morning    Previously tried:  Adherence Assessment  Do you ever forget to take your medication? Uses pill box  [] Yes [x] No  Do you ever skip doses due to side effects? [] Yes [x] No  Do you have trouble affording your medicines? [] Yes [x] No  Are you ever unable to pick up your medication due to transportation difficulties? [] Yes [x] No  Do you ever stop taking your medications because you don't believe they are helping? [] Yes [x] No  Do you check your weight daily? [] Yes [x] No   Adherence strategy: pill box   Barriers to  obtaining medications: none - enrolled in the grant for brand name medications   BP goal: <130/80   Family History: Relation Problem Comments  Mother (Deceased at age 49) Deep vein thrombosis   Diabetes retinopathy/blind  Hypertension     Father (Deceased at age 53) Prostate cancer      Social History:  Alcohol: none  Smoking: never   Diet: low salt diet. Does not add slat to her home cooked meals only eats out occasionally.   Exercise: 30 min walks and some resistance at gym -2 times per week planning to go to 3-4 times per week   Home BP readings: ~ 120/69-72 heart rate ~80  Wt Readings from Last 3 Encounters:  11/30/23 150 lb 6 oz (68.2 kg)  11/05/23 154 lb 6.4 oz (70 kg)  09/26/23 154 lb (69.9 kg)   BP Readings from Last 3 Encounters:  11/30/23 104/67  11/30/23 110/80  11/05/23 108/60   Pulse Readings from Last 3 Encounters:  11/30/23 60  11/05/23 70  10/31/23 75    Renal function: CrCl cannot be calculated (Patient's most recent lab result is older than the maximum 21 days allowed.).  Past Medical History:  Diagnosis Date   Acute venous embolism and thrombosis of unspecified deep vessels of lower extremity 2001   RLE DVT and PE, coumadin x 2mo, now ASSA   Allergy    Anxiety state, unspecified    Blood transfusion without reported diagnosis    Cataract    Chronic idiopathic constipation 11/02/2016   Clotting disorder (HCC)    Degeneration of cervical intervertebral disc    Gallstones    History of DVT (deep vein thrombosis)    Iron deficiency anemia secondary to blood loss (chronic)    Irritable bowel syndrome    Lumbago    Other B-complex deficiencies    Personal history of colonic polyps    adenomatous   Pure hypercholesterolemia    Reflux esophagitis    Tubular adenoma of colon 09/2002   Type II or unspecified type diabetes mellitus without mention of complication, not stated as uncontrolled    Unspecified essential hypertension     Unspecified hypothyroidism    Unspecified iridocyclitis    Unspecified venous (peripheral) insufficiency    Unspecified vitamin D  deficiency     Current Outpatient Medications on File Prior to Visit  Medication Sig Dispense Refill   ACCU-CHEK GUIDE TEST test strip SMARTSIG:Strip(s)     ALPRAZolam  (XANAX ) 0.5 MG tablet TAKE 1 TABLET BY MOUTH THREE TIMES DAILY AS NEEDED FOR ANXIETY OR SLEEP 270 tablet 0   aspirin  81 MG chewable tablet Chew 81 mg by mouth every morning.     carvedilol  (COREG ) 12.5 MG tablet Take 1 tablet (12.5 mg total) by mouth 2 (two) times daily. 180 tablet 3   Cholecalciferol  50 MCG (2000 UT) TABS Take 1 tablet (2,000 Units total) by mouth daily. 90  tablet 1   cyanocobalamin  (VITAMIN B12) 1000 MCG/ML injection INJECT 1 ML  INTRAMUSCULARLY ONCE EVERY MONTH 3 mL 0   empagliflozin  (JARDIANCE ) 10 MG TABS tablet Take 1 tablet (10 mg total) by mouth in the morning.     famotidine  (PEPCID ) 20 MG tablet Take 2 tablets by mouth once daily (Patient taking differently: Take 20 mg by mouth daily.) 180 tablet 0   levothyroxine  (SYNTHROID ) 88 MCG tablet Take 1 tablet (88 mcg total) by mouth daily before breakfast. 90 tablet 3   magnesium hydroxide (DULCOLAX) 400 MG/5ML suspension Take 15 mLs by mouth daily as needed for mild constipation.     MAXITROL 3.5-10000-0.1 OINT Place 1 Application into the left eye 3 (three) times daily.     mirtazapine  (REMERON  SOL-TAB) 15 MG disintegrating tablet Take 15 mg by mouth at bedtime as needed.     Omega-3 Fatty Acids (FISH OIL PO) Take 1,400 mg by mouth daily.     Pharmacist Choice Lancets MISC      Polyethyl Glycol-Propyl Glycol (SYSTANE) 0.4-0.3 % SOLN Place 1 drop into both eyes daily as needed (dry eyes).     rosuvastatin  (CRESTOR ) 10 MG tablet TAKE 1 TABLET BY MOUTH IN THE  MORNING 90 tablet 3   sacubitril-valsartan (ENTRESTO ) 24-26 MG Take 1 tablet by mouth 2 (two) times daily. 60 tablet 11   spironolactone  (ALDACTONE ) 25 MG tablet Take 1  tablet (25 mg total) by mouth in the morning. 90 tablet 3   traZODone  (DESYREL ) 50 MG tablet Take 1 tablet (50 mg total) by mouth at bedtime. 90 tablet 0   No current facility-administered medications on file prior to visit.    Allergies  Allergen Reactions   Lisinopril  Other (See Comments) and Cough    Cough and throat clearing   Metoprolol Succinate Other (See Comments)    Unknown reaction   Penicillins Swelling    Has patient had a PCN reaction causing immediate rash, facial/tongue/throat swelling, SOB or lightheadedness with hypotension: Yes Has patient had a PCN reaction causing severe rash involving mucus membranes or skin necrosis: No Has patient had a PCN reaction that required hospitalization: No Has patient had a PCN reaction occurring within the last 10 years: No If all of the above answers are NO, then may proceed with Cephalosporin use.    Sulfamethoxazole Other (See Comments)    Unknown reaction   Sulfamethoxazole-Trimethoprim Other (See Comments)    Unknown reaction   Trimethoprim Other (See Comments)    Unknown reaction   Metformin Nausea Only     Assessment/Plan:  1. CHF -  No problem-specific Assessment & Plan notes found for this encounter.  There are no diagnoses linked to this encounter.     Thank you   Robbi Blanch, Pharm.D [facility] A Division of [facility] [address] [place], [place] [address]  Phone: [phone]; Fax: [phone]

## 2023-12-20 ENCOUNTER — Other Ambulatory Visit (HOSPITAL_COMMUNITY): Payer: Self-pay

## 2023-12-20 ENCOUNTER — Encounter: Payer: Self-pay | Admitting: Pharmacist

## 2023-12-20 ENCOUNTER — Ambulatory Visit: Attending: Cardiology | Admitting: Pharmacist

## 2023-12-20 VITALS — BP 103/69 | HR 65

## 2023-12-20 DIAGNOSIS — I10 Essential (primary) hypertension: Secondary | ICD-10-CM

## 2023-12-20 MED ORDER — CARVEDILOL 6.25 MG PO TABS
6.2500 mg | ORAL_TABLET | Freq: Two times a day (BID) | ORAL | 3 refills | Status: AC
  Filled 2023-12-20: qty 180, 90d supply, fill #0

## 2023-12-20 NOTE — Assessment & Plan Note (Signed)
 Assessment and Plan: Her BP was staying below 100/60 most days of the week so she was advised to skip one of her HF meds dose that has brought the BP up in 115-120/75 range ( per home BP log) so we reduce dose carvedilol  from 12.5 mg twice daily to 6.25 mg twice daily to reduce hypotension and advised pt to take HF meds regularly without skipping any dose.   Current CHF meds: Jardiance  10 mg daily,  carvedilol  6.25 mg twice daily, Entresto  24/26 mg twice daily , spironolactone  12.5 mg every morning

## 2023-12-31 ENCOUNTER — Ambulatory Visit: Admitting: Cardiology

## 2024-01-07 DIAGNOSIS — E1151 Type 2 diabetes mellitus with diabetic peripheral angiopathy without gangrene: Secondary | ICD-10-CM | POA: Diagnosis not present

## 2024-01-07 DIAGNOSIS — M65871 Other synovitis and tenosynovitis, right ankle and foot: Secondary | ICD-10-CM | POA: Diagnosis not present

## 2024-01-07 DIAGNOSIS — G5762 Lesion of plantar nerve, left lower limb: Secondary | ICD-10-CM | POA: Diagnosis not present

## 2024-01-07 DIAGNOSIS — G5761 Lesion of plantar nerve, right lower limb: Secondary | ICD-10-CM | POA: Diagnosis not present

## 2024-01-08 ENCOUNTER — Encounter: Payer: Self-pay | Admitting: Cardiology

## 2024-01-08 ENCOUNTER — Ambulatory Visit: Attending: Cardiology | Admitting: Cardiology

## 2024-01-08 VITALS — BP 134/75 | HR 62 | Resp 16 | Ht 65.0 in

## 2024-01-08 DIAGNOSIS — I5032 Chronic diastolic (congestive) heart failure: Secondary | ICD-10-CM

## 2024-01-08 DIAGNOSIS — E785 Hyperlipidemia, unspecified: Secondary | ICD-10-CM | POA: Diagnosis not present

## 2024-01-08 DIAGNOSIS — I340 Nonrheumatic mitral (valve) insufficiency: Secondary | ICD-10-CM | POA: Diagnosis not present

## 2024-01-08 DIAGNOSIS — D472 Monoclonal gammopathy: Secondary | ICD-10-CM

## 2024-01-08 DIAGNOSIS — I429 Cardiomyopathy, unspecified: Secondary | ICD-10-CM

## 2024-01-08 DIAGNOSIS — I10 Essential (primary) hypertension: Secondary | ICD-10-CM | POA: Diagnosis not present

## 2024-01-08 NOTE — Patient Instructions (Signed)
 Medication Instructions:  Your physician recommends that you continue on your current medications as directed. Please refer to the Current Medication list given to you today.  *If you need a refill on your cardiac medications before your next appointment, please call your pharmacy*  Lab Work: None ordered.  If you have labs (blood work) drawn today and your tests are completely normal, you will receive your results only by: MyChart Message (if you have MyChart) OR A paper copy in the mail If you have any lab test that is abnormal or we need to change your treatment, we will call you to review the results.  Testing/Procedures: Your physician has requested that you have an echocardiogram. Echocardiography is a painless test that uses sound waves to create images of your heart. It provides your doctor with information about the size and shape of your heart and how well your heart's chambers and valves are working. This procedure takes approximately one hour. There are no restrictions for this procedure. Please do NOT wear cologne, perfume, aftershave, or lotions (deodorant is allowed). Please arrive 15 minutes prior to your appointment time.  Please note: We ask at that you not bring children with you during ultrasound (echo/ vascular) testing. Due to room size and safety concerns, children are not allowed in the ultrasound rooms during exams. Our front office staff cannot provide observation of children in our lobby area while testing is being conducted. An adult accompanying a patient to their appointment will only be allowed in the ultrasound room at the discretion of the ultrasound technician under special circumstances. We apologize for any inconvenience.   Follow-Up: At [facility], you and your health needs are our priority.  As part of our continuing mission to provide you with exceptional heart care, our providers are all part of one team.  This team includes your primary  Cardiologist (physician) and Advanced Practice Providers or APPs (Physician Assistants and Nurse Practitioners) who all work together to provide you with the care you need, when you need it.  Your next appointment:   6 months with Dr Michele

## 2024-01-08 NOTE — Progress Notes (Signed)
 Cardiology Office Note:  .   Date:  01/08/2024  ID:  Katie [name], DOB Nov 19, 1945, MRN [id number] PCP:  Joshua Debby CROME, MD  Former Cardiology Providers: None [facility] Providers Cardiologist:  Madonna Large, DO Advanced Heart Failure:  Toribio Fuel, MD , [facility] (established care June 03, 2020) Electrophysiologist:  None  Click to update primary MD,subspecialty MD or APP then REFRESH:1}    Chief Complaint  Patient presents with   Follow-up    Heart failure     History of Present Illness: .   Katie [name] is a 78 y.o. African-American female whose past medical history and cardiovascular risk factors includes: Chronic HFrEF, Cardiomyopathy, IgG lambda MGUS, History of DVT, GERD, hypertension chronic kidney disease stage, acquired hypothyroidism, hyperlipidemia, prediabetes, advanced age, postmenopausal female.   Patient is being followed by the practice given her valvular heart disease.  Serial echocardiograms started to illustrate reduction in LVEF.  Given the cardiomyopathy she was recommended to undergo ischemic workup.  However since she was asymptomatic she did not want to undergo MPI or coronary CTA in the past. She saw Dr. Rolan Fuel in October 2024 as well as January 2025.  Cardiac MRI was performed in the interim which noted moderately reduced LVEF results noted below for further reference.  It was recommended that she continue with GDMT to maximally tolerated doses.  Of note, patient did undergo bone marrow biopsy in the past which noted small clone of plasma cells measuring about 3 to 5%, normal FISH testing, normal cytogenetics.  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   Since last office visit patient has been followed by Pharm.D. clinic for up titration of GDMT and is currently on well-tolerated medications without hypotension or electrolyte abnormalities.  Echocardiogram was scheduled for July 2025 but this is still pending.  Patient is  accompanied by her friend Suzen and she provides verbal consent with having her present during today's encounter.  Patient denies any anginal chest pain or heart failure symptoms.  No hospitalizations for congestive heart failure since last office visit.  She been following up with Pharm.D. clinic on a regular basis with uptitration of GDMT which she is tolerating.  Home blood pressure log reviewed and SBP ranges between 110-134 mmHg.  Review of Systems: .   Review of Systems  Cardiovascular:  Negative for chest pain, claudication, irregular heartbeat, leg swelling, near-syncope, orthopnea, palpitations, paroxysmal nocturnal dyspnea and syncope.  Respiratory:  Negative for shortness of breath.   Hematologic/Lymphatic: Negative for bleeding problem.    Studies Reviewed:   EKG Interpretation Date/Time:  Tuesday January 08 2024 09:34:56 EDT Ventricular Rate:  65 PR Interval:  140 QRS Duration:  88 QT Interval:  376 QTC Calculation: 391 R Axis:   -24  Text Interpretation: Normal sinus rhythm Minimal voltage criteria for LVH, may be normal variant ( R in aVL ) Nonspecific T wave abnormality When compared with ECG of 16-Feb-2023 09:03, Premature supraventricular complexes are no longer Present Vent. rate has decreased BY  35 BPM Inverted T waves have replaced nonspecific T wave abnormality in Inferior leads Nonspecific T wave abnormality now evident in Anterolateral leads QT has shortened Confirmed by Large Madonna [phone]) on 01/08/2024 10:15:24 AM  Echocardiogram: Echo 1/22 EF 59% Lexiscan  2/22: EF 42% normal perfusion Echo 3/23 EF 45-50% Echo 9/24 EF 20-25%   Stress Testing: Exercise tetrofosmin stress test  06/23/2020: Low risk study  Cardiac MRI December 2024 1. Normal LV size, mild hypertrophy,  and moderate systolic dysfunction (EF 39%)   2.  Normal RV size with mild systolic dysfunction (EF 45%)   3. RV insertion site LGE, which is a nonspecific scar pattern often seen in  setting of elevated pulmonary pressures  RADIOLOGY: NA  Risk Assessment/Calculations:   N/A   Labs:       Latest Ref Rng & Units 07/09/2023   12:15 PM 06/04/2023    3:03 PM 09/29/2022    2:40 PM  CBC  WBC 4.0 - 10.5 K/uL 4.8  4.8  5.3   Hemoglobin 12.0 - 15.0 g/dL 86.3  85.8  86.2   Hematocrit 36.0 - 46.0 % 38.6  42.3  38.2   Platelets 150 - 400 K/uL 186  215.0  183        Latest Ref Rng & Units 11/12/2023    9:17 AM 07/17/2023   11:19 AM 07/09/2023   12:15 PM  BMP  Glucose 70 - 99 mg/dL 852  893  841   BUN 8 - 27 mg/dL 18  14  14    Creatinine 0.57 - 1.00 mg/dL 8.48  8.79  8.78   BUN/Creat Ratio 12 - 28 12  12     Sodium 134 - 144 mmol/L 137  141  142   Potassium 3.5 - 5.2 mmol/L 4.9  4.7  3.1   Chloride 96 - 106 mmol/L 103  104  105   CO2 20 - 29 mmol/L 21  23  31    Calcium  8.7 - 10.3 mg/dL 89.5  89.9  9.8       Latest Ref Rng & Units 11/12/2023    9:17 AM 07/17/2023   11:19 AM 07/09/2023   12:15 PM  CMP  Glucose 70 - 99 mg/dL 852  893  841   BUN 8 - 27 mg/dL 18  14  14    Creatinine 0.57 - 1.00 mg/dL 8.48  8.79  8.78   Sodium 134 - 144 mmol/L 137  141  142   Potassium 3.5 - 5.2 mmol/L 4.9  4.7  3.1   Chloride 96 - 106 mmol/L 103  104  105   CO2 20 - 29 mmol/L 21  23  31    Calcium  8.7 - 10.3 mg/dL 89.5  89.9  9.8   Total Protein 6.5 - 8.1 g/dL   7.0   Total Bilirubin 0.0 - 1.2 mg/dL   1.6   Alkaline Phos 38 - 126 U/L   45   AST 15 - 41 U/L   23   ALT 0 - 44 U/L   16     Lab Results  Component Value Date   CHOL 103 09/05/2023   HDL 65.10 09/05/2023   LDLCALC 26 09/05/2023   LDLDIRECT 105.9 07/04/2007   TRIG 55.0 09/05/2023   CHOLHDL 2 09/05/2023   No results for input(s): LIPOA in the last 8760 hours. No components found for: NTPROBNP No results for input(s): PROBNP in the last 8760 hours. Recent Labs    06/04/23 1503 09/05/23 1138  TSH 2.27 0.81    Physical Exam:    Today's Vitals   01/08/24 0936  BP: 134/75  Pulse: 62  Resp: 16  SpO2:  98%  Height: 5' 5 (1.651 m)   Body mass index is 25.02 kg/m. Wt Readings from Last 3 Encounters:  11/30/23 150 lb 6 oz (68.2 kg)  11/05/23 154 lb 6.4 oz (70 kg)  09/26/23 154 lb (69.9 kg)    Physical Exam  Constitutional: No  distress.  Age appropriate, hemodynamically stable.   Eyes:  Arcus senilis  Neck: No JVD present.  Cardiovascular: Normal rate, regular rhythm, S1 normal, S2 normal, intact distal pulses and normal pulses. Exam reveals no gallop, no S3 and no S4.  Murmur heard. Holosystolic murmur is present with a grade of 3/6 at the apex radiating to the axilla. Pulmonary/Chest: Effort normal and breath sounds normal. No stridor. She has no wheezes. She has no rales.  Abdominal: Soft. Bowel sounds are normal. She exhibits no distension. There is no abdominal tenderness.  Musculoskeletal:        General: No edema.     Cervical back: Neck supple.  Neurological: She is alert and oriented to person, place, and time. She has intact cranial nerves (2-12).  Skin: Skin is warm and moist.   Impression & Recommendation(s):  Impression:   ICD-10-CM   1. Heart failure with improved ejection fraction (HFimpEF) (HCC)  I50.32     2. Cardiomyopathy, unspecified type (HCC)  I42.9 ECHOCARDIOGRAM COMPLETE    3. Essential hypertension  I10 EKG 12-Lead    4. MGUS (monoclonal gammopathy of unknown significance)  D47.2     5. Hyperlipidemia  E78.5     6. Nonrheumatic mitral valve regurgitation  I34.0        Recommendation(s):  Chronic heart failure with improved ejection fraction  Cardiomyopathy, unspecified type (HCC) Stage B, NYHA class II Echocardiogram September 2024: LVEF 20-25% Cardiac MRI December 2024 noted LVEF at 39%, see report for additional details Continue carvedilol  6.25 mg p.o. twice daily. Continue Jardiance  10 mg p.o. daily Continue Entresto  24/26 mg p.o. twice daily, transition from ARB to ARNI in June 2025 Continue Aldactone  25 mg p.o. daily, transitioned from  12.5 mg p.o. daily to 25 mg p.o. daily in June 2025 Continue with Pharm.D. follow-ups for uptitration of GDMT as well as medication assistance. Echo will be ordered to evaluate for structural heart disease and left ventricular systolic function. I did introduce the concept of ICD implant for primary prevention if LVEF is <or equal to 35%.  Her questions and concerns are addressed.  Will await the results of the echocardiogram prior to discussing this further. Increased personal stress could be a risk factor to her underlying cardiomyopathy.  Essential hypertension Home and office blood pressures are acceptable, however blood pressures at times are soft. Patient is advised to hold GDMT if SBP is <100 mmHg. Medications as noted above. Reemphasized importance of low-salt diet.  MGUS (monoclonal gammopathy of unknown significance) No documented evidence of cardiac amyloidosis on the recent cardiac MRI.  Hyperlipidemia Currently on Crestor  10 mg p.o. daily.   She denies myalgia or other side effects. Most recent lipids dated April 2025, independently reviewed as noted above.   LDL well-controlled - 26 mg/dL Cardiology is peripherally following.  Nonrheumatic mitral valve regurgitation Remains asymptomatic. Monitor for now   Orders Placed:  Orders Placed This Encounter  Procedures   EKG 12-Lead   ECHOCARDIOGRAM COMPLETE    Standing Status:   Future    Expiration Date:   01/07/2025    Where should this test be performed[facility]    Does the patient weigh less than or greater than 250 lbs?:   Patient weighs less than 250 lbs    Perflutren DEFINITY (image enhancing agent) should be administered unless hypersensitivity or allergy exist:   Administer Perflutren    Reason for exam-Echo:   Cardiomyopathy-Unspecified  I42.9   Final Medication List:  No orders of the defined types were placed in this encounter.   Medications Discontinued During This Encounter  Medication  Reason   famotidine  (PEPCID ) 20 MG tablet Change in therapy     Current Outpatient Medications:    ACCU-CHEK GUIDE TEST test strip, SMARTSIG:Strip(s), Disp: , Rfl:    ALPRAZolam  (XANAX ) 0.5 MG tablet, TAKE 1 TABLET BY MOUTH THREE TIMES DAILY AS NEEDED FOR ANXIETY OR SLEEP, Disp: 270 tablet, Rfl: 0   aspirin  81 MG chewable tablet, Chew 81 mg by mouth every morning., Disp: , Rfl:    carvedilol  (COREG ) 6.25 MG tablet, Take 1 tablet (6.25 mg total) by mouth 2 (two) times daily., Disp: 180 tablet, Rfl: 3   Cholecalciferol  50 MCG (2000 UT) TABS, Take 1 tablet (2,000 Units total) by mouth daily., Disp: 90 tablet, Rfl: 1   cyanocobalamin  (VITAMIN B12) 1000 MCG/ML injection, INJECT 1 ML  INTRAMUSCULARLY ONCE EVERY MONTH, Disp: 3 mL, Rfl: 0   empagliflozin  (JARDIANCE ) 10 MG TABS tablet, Take 1 tablet (10 mg total) by mouth in the morning., Disp: , Rfl:    levothyroxine  (SYNTHROID ) 88 MCG tablet, Take 1 tablet (88 mcg total) by mouth daily before breakfast., Disp: 90 tablet, Rfl: 3   magnesium hydroxide (DULCOLAX) 400 MG/5ML suspension, Take 15 mLs by mouth daily as needed for mild constipation., Disp: , Rfl:    MAXITROL 3.5-10000-0.1 OINT, Place 1 Application into the left eye 3 (three) times daily., Disp: , Rfl:    mirtazapine  (REMERON  SOL-TAB) 15 MG disintegrating tablet, Take 15 mg by mouth at bedtime as needed., Disp: , Rfl:    Omega-3 Fatty Acids (FISH OIL PO), Take 1,400 mg by mouth daily., Disp: , Rfl:    omeprazole (PRILOSEC) 40 MG capsule, Take 40 mg by mouth daily., Disp: , Rfl:    Pharmacist Choice Lancets MISC, , Disp: , Rfl:    Polyethyl Glycol-Propyl Glycol (SYSTANE) 0.4-0.3 % SOLN, Place 1 drop into both eyes daily as needed (dry eyes)., Disp: , Rfl:    rosuvastatin  (CRESTOR ) 10 MG tablet, TAKE 1 TABLET BY MOUTH IN THE  MORNING, Disp: 90 tablet, Rfl: 3   sacubitril-valsartan (ENTRESTO ) 24-26 MG, Take 1 tablet by mouth 2 (two) times daily., Disp: 60 tablet, Rfl: 11   spironolactone   (ALDACTONE ) 25 MG tablet, Take 1 tablet (25 mg total) by mouth in the morning., Disp: 90 tablet, Rfl: 3   traZODone  (DESYREL ) 50 MG tablet, Take 1 tablet (50 mg total) by mouth at bedtime., Disp: 90 tablet, Rfl: 0  Consent:   NA  Disposition:   6 months follow-up sooner if needed.  Her questions and concerns were addressed to her satisfaction. She voices understanding of the recommendations provided during this encounter.    Signed, Madonna Michele HAS, [facility]  A Division of [facility] [address], [place], [place] [address]   01/08/2024 11:03 AM

## 2024-01-15 ENCOUNTER — Telehealth: Payer: Self-pay | Admitting: Cardiology

## 2024-01-15 NOTE — Telephone Encounter (Signed)
 Pt c/o BP issue: STAT if pt c/o blurred vision, one-sided weakness or slurred speech.  STAT if BP is GREATER than 180/120 TODAY.  STAT if BP is LESS than 90/60 and SYMPTOMATIC TODAY  1. What is your BP concern? Low BP  2. Have you taken any BP medication today? Yes carvedilol  (COREG ) 6.25 MG tablet  sacubitril-valsartan (ENTRESTO ) 24-26 MG   3. What are your last 5 BP readings?This morning 96/72 and Yesterday 101/68  4. Are you having any other symptoms (ex. Dizziness, headache, blurred vision, passed out)? No

## 2024-01-15 NOTE — Telephone Encounter (Signed)
 Spoke with pt who reports lower BP the past 2 days.  She denies current CP, SOB or dizziness.  She has been asymptomatic with her lower readings.  Pt feels she has been well hydrated.  She checks her BP as recommended prior to taking morning and evening doses of medications.   Pt advised per Dr Tyree note if her systolic BP is less than 100 she should hold her GMDT medications.  Pt is scheduled to see pharmacist on 9/29.   Advised will forward to pharmacist for review and any further recommendations.  Advised she should continue monitoring her BP and keep a log to bring with her to that appointment.  If she develops symptoms with low BP readings she should also notify office. May have a small salty snack to help with low BP.

## 2024-01-16 DIAGNOSIS — I739 Peripheral vascular disease, unspecified: Secondary | ICD-10-CM | POA: Diagnosis not present

## 2024-01-25 ENCOUNTER — Telehealth (HOSPITAL_COMMUNITY): Payer: Self-pay | Admitting: Internal Medicine

## 2024-01-30 ENCOUNTER — Encounter: Payer: Self-pay | Admitting: Internal Medicine

## 2024-01-30 ENCOUNTER — Ambulatory Visit: Admitting: Internal Medicine

## 2024-01-30 VITALS — BP 104/68 | HR 70 | Ht 65.0 in | Wt 146.0 lb

## 2024-01-30 DIAGNOSIS — E039 Hypothyroidism, unspecified: Secondary | ICD-10-CM | POA: Diagnosis not present

## 2024-01-30 DIAGNOSIS — E1142 Type 2 diabetes mellitus with diabetic polyneuropathy: Secondary | ICD-10-CM | POA: Diagnosis not present

## 2024-01-30 DIAGNOSIS — E1122 Type 2 diabetes mellitus with diabetic chronic kidney disease: Secondary | ICD-10-CM | POA: Diagnosis not present

## 2024-01-30 DIAGNOSIS — N1831 Chronic kidney disease, stage 3a: Secondary | ICD-10-CM | POA: Diagnosis not present

## 2024-01-30 LAB — POCT GLYCOSYLATED HEMOGLOBIN (HGB A1C): Hemoglobin A1C: 6.2 % — AB (ref 4.0–5.6)

## 2024-01-30 NOTE — Progress Notes (Unsigned)
 Name: Katie [name]  MRN/ DOB: [id number], 04-Apr-1946   Age/ Sex: 78 y.o., female    PCP: Joshua Debby CROME, MD   Reason for Endocrinology Evaluation: Type 2 Diabetes Mellitus     Date of Initial Endocrinology Visit: 09/21/2023     PATIENT IDENTIFIER: Katie [name] is a 78 y.o. female with a past medical history of DM, HTN, hypothyroid. The patient presented for initial endocrinology clinic visit on 01/30/2024 for consultative assistance with her diabetes management.    HPI: [name] was    Diagnosed with DM 2009 Prior Medications tried/Intolerance: Metformin- nausea. Trulicity- no interlance          Hemoglobin A1c has ranged from 5.2% in 2019, peaking at 6.9% in 2009.   She was to follow-up with [facility] On her initial visit to our clinic she had an A1c of 6.0%, she was on Jardiance  only which we continued  THYROID  HISTORY :  She has been on levothyroxine  for many years.  No prior RAI or anterior neck surgery in the past  SUBJECTIVE:   During the last visit (09/21/2023): A1c 6.0%  Today (01/30/24): Katie [name] is here for follow-up on diabetes management and thyroid  disease.  She checks blood sugars 1 times daily. The patient has not had hypoglycemic episodes since the last clinic visit.   She continues to follow-up with cardiology for CHF Patient also follows with [facility] No nausea or vomiting  No constipation or diarrhea  Has occasional palpitations    HOME DIABETES REGIMEN: Jardiance  10 mg, 1 tablet daily - pt assistance  Levothyroxine  88 mcg daily    Statin: yes ACE-I/ARB: yes   METER DOWNLOAD SUMMARY: Unable to download This a.m. 135 MGs/DL 887 - 813 mg/dL     DIABETIC COMPLICATIONS: Microvascular complications:  Polyneuropathy, CKD III Denies: DR Last eye exam: Completed 2025  Macrovascular complications:   Denies: CAD, PVD, CVA   PAST HISTORY: Past Medical History:  Past Medical History:  Diagnosis Date   Acute venous  embolism and thrombosis of unspecified deep vessels of lower extremity 2001   RLE DVT and PE, coumadin x 37mo, now ASSA   Allergy    Anxiety state, unspecified    Blood transfusion without reported diagnosis    Cataract    Chronic idiopathic constipation 11/02/2016   Clotting disorder (HCC)    Degeneration of cervical intervertebral disc    Gallstones    History of DVT (deep vein thrombosis)    Iron deficiency anemia secondary to blood loss (chronic)    Irritable bowel syndrome    Lumbago    Other B-complex deficiencies    Personal history of colonic polyps    adenomatous   Pure hypercholesterolemia    Reflux esophagitis    Tubular adenoma of colon 09/2002   Type II or unspecified type diabetes mellitus without mention of complication, not stated as uncontrolled    Unspecified essential hypertension    Unspecified hypothyroidism    Unspecified iridocyclitis    Unspecified venous (peripheral) insufficiency    Unspecified vitamin D  deficiency    Past Surgical History:  Past Surgical History:  Procedure Laterality Date   BIOPSY  04/24/2018   Procedure: BIOPSY;  Surgeon: Wilhelmenia Aloha Raddle., MD;  Location: [facility];  Service: Gastroenterology;;   ORIN MEDIATE RELEASE     CATARACT EXTRACTION Bilateral 08/29/2017,09/19/17   ESOPHAGOGASTRODUODENOSCOPY (EGD) WITH PROPOFOL  N/A 04/24/2018   Procedure: ESOPHAGOGASTRODUODENOSCOPY (EGD) WITH PROPOFOL ;  Surgeon: Wilhelmenia Aloha Raddle., MD;  Location: [facility];  Service: Gastroenterology;  Laterality: N/A;  with EMR   FEMORAL HERNIA REPAIR     OOPHORECTOMY     POLYPECTOMY  04/24/2018   Procedure: POLYPECTOMY;  Surgeon: Mansouraty, Aloha Raddle., MD;  Location: [facility];  Service: Gastroenterology;;   UPPER ESOPHAGEAL ENDOSCOPIC ULTRASOUND (EUS)  04/24/2018   Procedure: UPPER ESOPHAGEAL ENDOSCOPIC ULTRASOUND (EUS);  Surgeon: Wilhelmenia Aloha Raddle., MD;  Location: [facility];  Service: Gastroenterology;;   VESICOVAGINAL  FISTULA CLOSURE W/ TAH      Social History:  reports that she has never smoked. She has never used smokeless tobacco. She reports current alcohol use. She reports that she does not use drugs. Family History:  Family History  Problem Relation Age of Onset   Prostate cancer Father    Diabetes Mother        retinopathy/blind   Deep vein thrombosis Mother    Hypertension Mother    Healthy Sister    Colon cancer Neg Hx    Esophageal cancer Neg Hx    Stomach cancer Neg Hx    Rectal cancer Neg Hx      HOME MEDICATIONS: Allergies as of 01/30/2024       Reactions   Lisinopril  Other (See Comments), Cough   Cough and throat clearing   Metoprolol Succinate Other (See Comments)   Hypotension   Penicillins Swelling   Has patient had a PCN reaction causing immediate rash, facial/tongue/throat swelling, SOB or lightheadedness with hypotension: Yes Has patient had a PCN reaction causing severe rash involving mucus membranes or skin necrosis: No Has patient had a PCN reaction that required hospitalization: No Has patient had a PCN reaction occurring within the last 10 years: No If all of the above answers are NO, then may proceed with Cephalosporin use.   Sulfamethoxazole Other (See Comments)   Unknown reaction   Sulfamethoxazole-trimethoprim Other (See Comments)   Unknown reaction   Trimethoprim Other (See Comments)   Unknown reaction   Metformin Nausea Only        Medication List        Accurate as of January 30, 2024 10:36 AM. If you have any questions, ask your nurse or doctor.          Accu-Chek Guide Test test strip Generic drug: glucose blood SMARTSIG:Strip(s)   ALPRAZolam  0.5 MG tablet Commonly known as: XANAX  TAKE 1 TABLET BY MOUTH THREE TIMES DAILY AS NEEDED FOR ANXIETY OR SLEEP   aspirin  81 MG chewable tablet Chew 81 mg by mouth every morning.   carvedilol  6.25 MG tablet Commonly known as: COREG  Take 1 tablet (6.25 mg total) by mouth 2 (two) times  daily.   Cholecalciferol  50 MCG (2000 UT) Tabs Take 1 tablet (2,000 Units total) by mouth daily.   cyanocobalamin  1000 MCG/ML injection Commonly known as: VITAMIN B12 INJECT 1 ML  INTRAMUSCULARLY ONCE EVERY MONTH   Dulcolax 1200 MG/15ML suspension Generic drug: magnesium hydroxide Take 15 mLs by mouth daily as needed for mild constipation.   empagliflozin  10 MG Tabs tablet Commonly known as: Jardiance  Take 1 tablet (10 mg total) by mouth in the morning.   Entresto  24-26 MG Generic drug: sacubitril-valsartan Take 1 tablet by mouth 2 (two) times daily.   FISH OIL PO Take 1,400 mg by mouth daily.   levothyroxine  88 MCG tablet Commonly known as: SYNTHROID  Take 1 tablet (88 mcg total) by mouth daily before breakfast.   Maxitrol 3.5-10000-0.1 Oint Generic drug: neomycin-polymyxin-dexameth Place 1 Application into the left eye 3 (three) times daily.  mirtazapine  15 MG disintegrating tablet Commonly known as: REMERON  SOL-TAB Take 15 mg by mouth at bedtime as needed.   omeprazole 40 MG capsule Commonly known as: PRILOSEC Take 40 mg by mouth daily.   Pharmacist Choice Lancets Misc   rosuvastatin  10 MG tablet Commonly known as: CRESTOR  TAKE 1 TABLET BY MOUTH IN THE  MORNING   spironolactone  25 MG tablet Commonly known as: ALDACTONE  Take 1 tablet (25 mg total) by mouth in the morning.   Systane 0.4-0.3 % Soln Generic drug: Polyethyl Glycol-Propyl Glycol Place 1 drop into both eyes daily as needed (dry eyes).   traZODone  50 MG tablet Commonly known as: DESYREL  Take 1 tablet (50 mg total) by mouth at bedtime.         ALLERGIES: Allergies  Allergen Reactions   Lisinopril  Other (See Comments) and Cough    Cough and throat clearing   Metoprolol Succinate Other (See Comments)    Hypotension   Penicillins Swelling    Has patient had a PCN reaction causing immediate rash, facial/tongue/throat swelling, SOB or lightheadedness with hypotension: Yes Has patient had a  PCN reaction causing severe rash involving mucus membranes or skin necrosis: No Has patient had a PCN reaction that required hospitalization: No Has patient had a PCN reaction occurring within the last 10 years: No If all of the above answers are NO, then may proceed with Cephalosporin use.    Sulfamethoxazole Other (See Comments)    Unknown reaction   Sulfamethoxazole-Trimethoprim Other (See Comments)    Unknown reaction   Trimethoprim Other (See Comments)    Unknown reaction   Metformin Nausea Only     REVIEW OF SYSTEMS: A comprehensive ROS was conducted with the patient and is negative except as per HPI    OBJECTIVE:   VITAL SIGNS: BP 104/68 (BP Location: Left Arm, Patient Position: Sitting, Cuff Size: Normal)   Pulse 70   Ht 5' 5 (1.651 m)   Wt 146 lb (66.2 kg)   SpO2 96%   BMI 24.30 kg/m    PHYSICAL EXAM:  General: Pt appears well and is in NAD  Neck: General: Supple without adenopathy or carotid bruits. Thyroid : Thyroid  size normal.  No goiter or nodules appreciated.   Lungs: Clear with good BS bilat   Heart: RRR   Abdomen:  soft, nontender  Extremities:  Lower extremities - No pretibial edema.   Neuro: MS is good with appropriate affect, pt is alert and Ox3    DM foot exam: 09/20/2023 per podiatry   DATA REVIEWED:  Lab Results  Component Value Date   HGBA1C 6.0 (A) 09/21/2023   HGBA1C 5.6 06/04/2023   HGBA1C 5.6 10/10/2022      Latest Reference Range & Units 11/12/23 09:17  Sodium 134 - 144 mmol/L 137  Potassium 3.5 - 5.2 mmol/L 4.9  Chloride 96 - 106 mmol/L 103  CO2 20 - 29 mmol/L 21  Glucose 70 - 99 mg/dL 852 (H)  BUN 8 - 27 mg/dL 18  Creatinine 9.42 - 8.99 mg/dL 8.48 (H)  Calcium  8.7 - 10.3 mg/dL 89.5 (H)  BUN/Creatinine Ratio 12 - 28  12  eGFR >59 mL/min/1.73 35 (L)    Old records , labs and images have been reviewed.   ASSESSMENT / PLAN / RECOMMENDATIONS:   1) Type 2 Diabetes Mellitus, optimally controlled, With neuropathic and CKD  III complications - Most recent A1c of 6.2 %. Goal A1c < 7.0 %.    -A1c remains optimal - She used to  be on Trulicity but was not able to get it for the past year - She is already on Jardiance  per cardiology, patient is on assistance program, we discussed cardiovascular benefits of SGLT2 inhibitors - I did notice her fasting BG's fluctuate between the low 100s in the high 100s.  I did advise the patient to avoid snacking at night, we also discussed low-carb options should she need to snack but to avoid carbohydrates for snacking     MEDICATIONS: Continue Jardiance  10 mg daily  EDUCATION / INSTRUCTIONS: BG monitoring instructions: Patient is instructed to check her blood sugars 1 times a day.   2) Diabetic complications:  Eye: Does not have known diabetic retinopathy.  Neuro/ Feet: Does  have known diabetic peripheral neuropathy. Renal: Patient does  have known baseline CKD. She is  on an ACEI/ARB at present.  3) Hypothyroidism:  - No local neck symptoms  - Patient is clinically euthyroid - TFTs***  Medication Continue levothyroxine  88 mcg daily  Follow-up in 4 months   I spent 45 minutes preparing to see the patient by review of recent labs, imaging and procedures, obtaining and reviewing separately obtained history, communicating with the patient/family or caregiver, ordering medications, tests or procedures, and documenting clinical information in the EHR including the differential Dx, treatment, and any further evaluation and other management    Signed electronically by: Stefano Redgie Butts, MD  [facility]  [facility] [address] [place] [place], [place] [address] Phone: [phone] FAX: [phone]   CC: Joshua Debby CROME, MD [address] [place] [address] Phone: [phone]  Fax: [phone]    Return to Endocrinology clinic as below: Future Appointments  Date Time Provider Department Center  02/11/2024  1:30  PM Tobie Robbi POUR, [facility] H&V  02/11/2024  2:45 PM [facility] 3 [facility] H&V  02/27/2024 10:40 AM Bensimhon, Toribio SAUNDERS, MD [facility] None  02/27/2024  1:10 PM [facility]-ANNUAL WELLNESS VISIT [facility]  03/06/2024 10:40 AM Joshua Debby CROME, MD [facility]  07/08/2024 11:30 AM Loretha Ash, MD [facility] None  07/08/2024 12:15 PM [facility] LAB [facility] None

## 2024-01-30 NOTE — Patient Instructions (Signed)
 Continue Jardiance  10 mg, 1 tablet every morning

## 2024-01-31 ENCOUNTER — Ambulatory Visit: Payer: Self-pay | Admitting: Internal Medicine

## 2024-01-31 LAB — TSH: TSH: 1.66 m[IU]/L (ref 0.40–4.50)

## 2024-01-31 LAB — T4, FREE: Free T4: 1.9 ng/dL — ABNORMAL HIGH (ref 0.8–1.8)

## 2024-01-31 MED ORDER — LEVOTHYROXINE SODIUM 75 MCG PO TABS: 75.0000 ug | ORAL_TABLET | Freq: Every day | ORAL | 3 refills | Status: AC

## 2024-01-31 NOTE — Telephone Encounter (Signed)
 Please let the patient know that her thyroid  test shows the current dose is a bit too much for her, due to risk of too much thyroid  on her heart I would like to decrease the dose to 75 mcg daily   Please schedule patient for repeat labs in 2 months  Thanks

## 2024-02-05 ENCOUNTER — Ambulatory Visit: Payer: Self-pay

## 2024-02-05 NOTE — Telephone Encounter (Signed)
 FYI Only or Action Required?: Action required by provider: pt would like doctor to review trazodone  dosing.  Patient was last seen in primary care on 11/30/2023 by Norleen Lynwood ORN, MD.  Called Nurse Triage reporting Medication Consultation.  Symptoms began several months ago.  Interventions attempted: Nothing.  Symptoms are: unchanged.  Triage Disposition: Call PCP When Office is Open  Patient/caregiver understands and will follow disposition?: Yes, will follow disposition  Copied from CRM #[id number]. Topic: Clinical - Red Word Triage >> Feb 05, 2024  2:00 PM Mia F wrote: Red Word that prompted transfer to Nurse Triage: Pt states she has been dealing with anxiety since her husband and sister passed away. She was put on traZODone  (DESYREL ) 50 MG tablet but stopped taking it because she was feeling off balance when taking. Right now she is just taking ALPRAZolam  (XANAX ) 0.5 MG tablet. She wants to know if she should go back taking the Trazadone but cut it in half. She was feeling anxious today and not like herself but took a half of a pill and seems to be doing a little better. Reason for Disposition  [1] Caller has NON-URGENT medicine question about med that PCP prescribed AND [2] triager unable to answer question  Answer Assessment - Initial Assessment Questions 1. CONCERN: Did anything happen that prompted you to call today?      Pt states that she is concerned about taking trazodone  at night to sleep better and also taking xanax . Pt states that she would like PCP to review the medications and advise if 50 mg of trazodone  is to much for her. Pt would also like to know if she can take xanax  in the middle of the night if she wakes and cannot get back to sleep. Pt has not been taking trazodone  to sleep at night since May.  3. ONSET: How long have you been feeling this way? (e.g., hours, days, weeks)     About 9 months 7. RISK OF HARM - SUICIDAL IDEATION: Do you ever have thoughts of hurting  or killing yourself? If Yes, ask:  Do you have these feelings now? Do you have a plan on how you would do this?     denies 8. TREATMENT:  What has been done so far to treat this anxiety? (e.g., medicines, relaxation strategies). What has helped?    Pt states that xanax  helps the most, trazodone  makes her feel off balance  Pt states that she has been given alprazolam  and trazodone  for anxiety. Pt states that the alprazolam  helps with the anxiety feelings. States she take 1-1.5 pills throughout the day. Pt states that she is waking in the middle of the night and needs to take another alprazolam . Pt denies anxiety s/s and feelings of anxiety during the call.  Protocols used: Anxiety and Panic Attack-A-AH, Medication Question Call-A-AH

## 2024-02-08 DIAGNOSIS — E119 Type 2 diabetes mellitus without complications: Secondary | ICD-10-CM | POA: Diagnosis not present

## 2024-02-08 DIAGNOSIS — H524 Presbyopia: Secondary | ICD-10-CM | POA: Diagnosis not present

## 2024-02-08 DIAGNOSIS — H35362 Drusen (degenerative) of macula, left eye: Secondary | ICD-10-CM | POA: Diagnosis not present

## 2024-02-08 DIAGNOSIS — H02881 Meibomian gland dysfunction right upper eyelid: Secondary | ICD-10-CM | POA: Diagnosis not present

## 2024-02-08 DIAGNOSIS — H04123 Dry eye syndrome of bilateral lacrimal glands: Secondary | ICD-10-CM | POA: Diagnosis not present

## 2024-02-08 LAB — HM DIABETES EYE EXAM

## 2024-02-08 NOTE — Telephone Encounter (Signed)
**Note De-identified  [place] Obfuscation** Please advise 

## 2024-02-09 ENCOUNTER — Other Ambulatory Visit: Payer: Self-pay | Admitting: Internal Medicine

## 2024-02-09 DIAGNOSIS — F411 Generalized anxiety disorder: Secondary | ICD-10-CM

## 2024-02-09 MED ORDER — TRAZODONE HCL 50 MG PO TABS: 50.0000 mg | ORAL_TABLET | Freq: Every day | ORAL | 0 refills | Status: DC

## 2024-02-11 ENCOUNTER — Ambulatory Visit (HOSPITAL_COMMUNITY)
Admission: RE | Admit: 2024-02-11 | Discharge: 2024-02-11 | Disposition: A | Discharge status: Home / Self Care | Source: Ambulatory Visit | Attending: Cardiology | Admitting: Cardiology

## 2024-02-11 ENCOUNTER — Encounter: Payer: Self-pay | Admitting: Pharmacist

## 2024-02-11 ENCOUNTER — Ambulatory Visit: Attending: Internal Medicine | Admitting: Pharmacist

## 2024-02-11 VITALS — BP 121/76 | HR 67

## 2024-02-11 DIAGNOSIS — I5042 Chronic combined systolic (congestive) and diastolic (congestive) heart failure: Secondary | ICD-10-CM

## 2024-02-11 DIAGNOSIS — I429 Cardiomyopathy, unspecified: Secondary | ICD-10-CM | POA: Insufficient documentation

## 2024-02-11 DIAGNOSIS — I10 Essential (primary) hypertension: Secondary | ICD-10-CM | POA: Diagnosis not present

## 2024-02-11 DIAGNOSIS — I361 Nonrheumatic tricuspid (valve) insufficiency: Secondary | ICD-10-CM | POA: Diagnosis not present

## 2024-02-11 MED ORDER — SPIRONOLACTONE 25 MG PO TABS: 12.5000 mg | ORAL_TABLET | Freq: Every morning | ORAL | Status: DC

## 2024-02-11 MED ORDER — PERFLUTREN LIPID MICROSPHERE
1.0000 mL | INTRAVENOUS | Status: AC | PRN
  Administered 2024-02-11: 2 mL via INTRAVENOUS

## 2024-02-11 NOTE — Patient Instructions (Addendum)
 Changes made by your pharmacist Valbona Slabach E. Yuval Rubens, PharmD at today's visit:    Instructions/Changes  (what do you need to do) Your Notes  (what you did and when you did it)  Continue Jardiance  10 mg daily,  carvedilol  12.5 mg twice daily, Entresto  24/26 mg twice daily, spironolactone  12.5 mg every morning    2.  Get labs today (02/11/24)   3. We will contact you with any medication changes when results come back    Bring all of your meds, your BP cuff and your record of home blood pressures to your next appointment.    HOW TO TAKE YOUR BLOOD PRESSURE AT HOME  Rest 5 minutes before taking your blood pressure.  Don't smoke or drink caffeinated beverages for at least 30 minutes before. Take your blood pressure before (not after) you eat. Sit comfortably with your back supported and both feet on the floor (don't cross your legs). Elevate your arm to heart level on a table or a desk. Use the proper sized cuff. It should fit smoothly and snugly around your bare upper arm. There should be enough room to slip a fingertip under the cuff. The bottom edge of the cuff should be 1 inch above the crease of the elbow. Ideally, take 3 measurements at one sitting and record the average.  Important lifestyle changes to control high blood pressure  Intervention  Effect on the BP  Lose extra pounds and watch your waistline Weight loss is one of the most effective lifestyle changes for controlling blood pressure. If you're overweight or obese, losing even a small amount of weight can help reduce blood pressure. Blood pressure might go down by about 1 millimeter of mercury (mm Hg) with each kilogram (about 2.2 pounds) of weight lost.  Exercise regularly As a general goal, aim for at least 30 minutes of moderate physical activity every day. Regular physical activity can lower high blood pressure by about 5 to 8 mm Hg.  Eat a healthy diet Eating a diet rich in whole grains, fruits, vegetables, and low-fat dairy  products and low in saturated fat and cholesterol. A healthy diet can lower high blood pressure by up to 11 mm Hg.  Reduce salt (sodium) in your diet Even a small reduction of sodium in the diet can improve heart health and reduce high blood pressure by about 5 to 6 mm Hg.  Limit alcohol One drink equals 12 ounces of beer, 5 ounces of wine, or 1.5 ounces of 80-proof liquor.  Limiting alcohol to less than one drink a day for women or two drinks a day for men can help lower blood pressure by about 4 mm Hg.   If you have any questions or concerns please use My Chart to send questions or call the office at [phone]

## 2024-02-11 NOTE — Progress Notes (Unsigned)
 Patient ID: Katie [name]                 DOB: 06/29/1945                      MRN: [id number]     HPI: Katie [name] is a 78 y.o. female referred by Dr. Michele to pharmacy clinic for HF medication management. PMH is significant for Cardiomyopathy, History of DVT, GERD, hypertension chronic kidney disease stage, acquired hypothyroidism, hyperlipidemia, prediabetes, advanced age . Most recent LVEF 20-25% on 04/26/23. Echo 1/22 EF 59% Lexiscan  2/22: EF 42% normal perfusion Echo 3/23 EF 45-50% Echo 9/24 EF 20-25%   Patient is being followed by the practice given her valvular heart disease.  Serial echocardiograms started to illustrate reduction in LVEF.  Given the cardiomyopathy she was recommended to undergo ischemic workup.  However since she was asymptomatic she did not want to undergo MPI or coronary CTA in the past.   Patient was seen in October 2024 by Orren Fabry -during which time cardiac MRI was ordered and she was referred to advanced heart failure to establish care.  GDMT was uptitrated.  Of note Entresto  was not refilled as it was cost prohibitive.   She saw Dr. Rolan Fuel in October 2024 as well as January 2025.  Cardiac MRI was performed in the interim which noted moderately reduced LVEF results noted below for further reference.  It was recommended that she continue with GDMT to maximally tolerated doses.  At last visit with Dr.Tolia spironolactone  dose were increased to 25 mg daily and hydralazine  dose were reduced from 25 mg three times daily to 25 mg twice daily. F/u BMP K level was elevated Lokelma  was prescribed and k level returned to normal (suspected lab error since pt's K level historically always WNL). Enrolled in the grant for Jardiance .   At last visit with me carvedilol  dose was increased and hydralazine  was d/c'd patient home and office BP was controlled.reported she follows low salt diet and goes to gym 3 times per week. Able to perform ADLs. Denies  LEE, PND, or orthopnea.  Has anxiety problem she takes alprazolam  prn. Try to minimize use of it. She is on Trazodone  prn at bedtime. Her mood is improving and she is less anxious then before, therapy sessions are helping.  At last visit to optimize heart failure management, the patient agreed to switch from losartan  to low-dose Entresto .  She was informed that she has been enrolled in a grant program, which will reduce the cost of Entresto  to $0.  Given her history of elevated potassium (previously up to 5.5), the dose of spironolactone  will be reduced to 12.5 mg daily to minimize the risk of hyperkalemia.  02/11/2024: Patient presented today accompanied by her friend. She provided blood pressure log; averaging mostly 110/75 mmHg. She does have three blood pressure readings: 96/72, 99/70, 84/62 mmHg. She does report that her BP machine needed batteries and believes those lower readings were not accurate. She reports no signs/symptoms of dizziness, SOB, headache, swelling or fatigue.   She expresses major concern about BP readings and is unsure if monitoring her BP prior to taking her antihypertensive medications would be beneficial. We reassured her that she does not have to check BP before taking medications and that she is doing a good job monitoring BP at this time.   She continues to follow a low-salt diet but has not yet started an exercise routine. She expresses intent to  begin exercising soon.  Current CHF meds: Jardiance  10 mg daily,  carvedilol  12.5 mg twice daily, Entresto  24/26 mg twice daily , spironolactone  12.5 mg every morning     Previously tried:  Adherence Assessment  Do you ever forget to take your medication? Uses pill box  [] Yes [x] No  Do you ever skip doses due to side effects? [] Yes [x] No  Do you have trouble affording your medicines? [] Yes [x] No  Are you ever unable to pick up your medication due to transportation difficulties? [] Yes [x] No  Do you ever stop taking your medications because  you don't believe they are helping? [] Yes [x] No  Do you check your weight daily? [] Yes [x] No   Adherence strategy: pill box   Barriers to obtaining medications: none - enrolled in the grant for brand name medications   BP goal: <130/80   Family History: Relation Problem Comments  Mother (Deceased at age 29) Deep vein thrombosis   Diabetes retinopathy/blind  Hypertension     Father (Deceased at age 25) Prostate cancer      Social History:  Alcohol: none  Smoking: never   Diet: low salt diet. Does not add slat to her home cooked meals only eats out occasionally.  1- 16 oz bottle of water  Exercise: 30 min walks and some resistance at gym -2 times per week planning to go to 3-4 times per week    Home BP readings: mostly averaging 110/75 mmHg; heart rate ~80; She noted 3 low BP readings: 96/72, 99/70, 84/62 mmHg.  Wt Readings from Last 3 Encounters:  01/30/24 146 lb (66.2 kg)  11/30/23 150 lb 6 oz (68.2 kg)  11/05/23 154 lb 6.4 oz (70 kg)   BP Readings from Last 3 Encounters:  02/11/24 121/76  01/30/24 104/68  01/08/24 134/75   Pulse Readings from Last 3 Encounters:  02/11/24 67  01/30/24 70  01/08/24 62    Renal function: Estimated Creatinine Clearance: 29.3 mL/min (A) (by C-G formula based on SCr of 1.4 mg/dL (H)).  Past Medical History:  Diagnosis Date   Acute venous embolism and thrombosis of unspecified deep vessels of lower extremity 2001   RLE DVT and PE, coumadin x 7mo, now ASSA   Allergy    Anxiety state, unspecified    Blood transfusion without reported diagnosis    Cataract    Chronic idiopathic constipation 11/02/2016   Clotting disorder    Degeneration of cervical intervertebral disc    Gallstones    History of DVT (deep vein thrombosis)    Iron deficiency anemia secondary to blood loss (chronic)    Irritable bowel syndrome    Lumbago    Other B-complex deficiencies    Personal history of colonic polyps    adenomatous   Pure  hypercholesterolemia    Reflux esophagitis    Tubular adenoma of colon 09/2002   Type II or unspecified type diabetes mellitus without mention of complication, not stated as uncontrolled    Unspecified essential hypertension    Unspecified hypothyroidism    Unspecified iridocyclitis    Unspecified venous (peripheral) insufficiency    Unspecified vitamin D  deficiency     Current Outpatient Medications on File Prior to Visit  Medication Sig Dispense Refill   ALPRAZolam  (XANAX ) 0.5 MG tablet TAKE 1 TABLET BY MOUTH THREE TIMES DAILY AS NEEDED FOR ANXIETY OR SLEEP 270 tablet 0   aspirin  81 MG chewable tablet Chew 81 mg by mouth every morning.     carvedilol  (COREG ) 6.25 MG tablet Take  1 tablet (6.25 mg total) by mouth 2 (two) times daily. 180 tablet 3   Cholecalciferol  50 MCG (2000 UT) TABS Take 1 tablet (2,000 Units total) by mouth daily. 90 tablet 1   cyanocobalamin  (VITAMIN B12) 1000 MCG/ML injection INJECT 1 ML  INTRAMUSCULARLY ONCE EVERY MONTH 3 mL 0   empagliflozin  (JARDIANCE ) 10 MG TABS tablet Take 1 tablet (10 mg total) by mouth in the morning.     levothyroxine  (SYNTHROID ) 75 MCG tablet Take 1 tablet (75 mcg total) by mouth daily. 90 tablet 3   magnesium hydroxide (DULCOLAX) 400 MG/5ML suspension Take 15 mLs by mouth daily as needed for mild constipation.     Omega-3 Fatty Acids (FISH OIL PO) Take 1,400 mg by mouth daily.     omeprazole (PRILOSEC) 40 MG capsule Take 40 mg by mouth daily.     Pharmacist Choice Lancets MISC      Polyethyl Glycol-Propyl Glycol (SYSTANE) 0.4-0.3 % SOLN Place 1 drop into both eyes daily as needed (dry eyes).     rosuvastatin  (CRESTOR ) 10 MG tablet TAKE 1 TABLET BY MOUTH IN THE  MORNING 90 tablet 3   sacubitril-valsartan (ENTRESTO ) 24-26 MG Take 1 tablet by mouth 2 (two) times daily. 60 tablet 11   traZODone  (DESYREL ) 50 MG tablet Take 1 tablet (50 mg total) by mouth at bedtime. 90 tablet 0   ACCU-CHEK GUIDE TEST test strip SMARTSIG:Strip(s)     MAXITROL  3.5-10000-0.1 OINT Place 1 Application into the left eye 3 (three) times daily. (Patient not taking: Reported on 02/11/2024)     mirtazapine  (REMERON  SOL-TAB) 15 MG disintegrating tablet Take 15 mg by mouth at bedtime as needed. (Patient not taking: Reported on 02/11/2024)     No current facility-administered medications on file prior to visit.    Allergies  Allergen Reactions   Lisinopril  Other (See Comments) and Cough    Cough and throat clearing   Metoprolol Succinate Other (See Comments)    Hypotension   Penicillins Swelling    Has patient had a PCN reaction causing immediate rash, facial/tongue/throat swelling, SOB or lightheadedness with hypotension: Yes Has patient had a PCN reaction causing severe rash involving mucus membranes or skin necrosis: No Has patient had a PCN reaction that required hospitalization: No Has patient had a PCN reaction occurring within the last 10 years: No If all of the above answers are NO, then may proceed with Cephalosporin use.    Sulfamethoxazole Other (See Comments)    Unknown reaction   Sulfamethoxazole-Trimethoprim Other (See Comments)    Unknown reaction   Trimethoprim Other (See Comments)    Unknown reaction   Metformin Nausea Only     Assessment/Plan:  1. CHF -  Essential hypertension Assessment: BP is controlled in office BP 121/76 mmHg below the goal (<130/80). Home BP readings are largely soft with a few SBP <100 mmHg Patient may benefit from de escalation from Entresto  to valsartan  Last BMP (3 months ago): Scr: 1.51, K: 4.9 Tolerates Jardiance  10 mg daily,  carvedilol  12.5 mg twice daily, Entresto  24/26 mg twice daily, spironolactone  12.5 mg every morning well without any side effects Denies SOB, palpitation, chest pain, headaches,or swelling Reiterated the importance of regular exercise and low salt diet   Plan:  Order BMP today  Tentative plan to switch from Entresto  24/26 mg bid to valsartan 40 mg twice daily once BMP  results As of now, continue taking Jardiance  10 mg daily,  carvedilol  12.5 mg twice daily, Entresto  24/26 mg twice  daily, spironolactone  12.5 mg every morning  Patient to keep record of BP readings with heart rate and report to us  at the next visit Patient to see PharmD in 4 weeks for follow up   Essential hypertension Overview: Estimated Creatinine Clearance: 40.9 mL/min (A) (by C-G formula based on SCr of 1.3 mg/dL (A)).  Estimated Creatinine Clearance: 41 mL/min (by C-G formula based on SCr of 1.15 mg/dL).   Assessment & Plan: Assessment: BP is controlled in office BP 121/76 mmHg below the goal (<130/80). Home BP readings are largely soft with a few SBP <100 mmHg Patient may benefit from de escalation from Entresto  to valsartan  Last BMP (3 months ago): Scr: 1.51, K: 4.9 Tolerates Jardiance  10 mg daily,  carvedilol  12.5 mg twice daily, Entresto  24/26 mg twice daily, spironolactone  12.5 mg every morning well without any side effects Denies SOB, palpitation, chest pain, headaches,or swelling Reiterated the importance of regular exercise and low salt diet   Plan:  Order BMP today  Tentative plan to switch from Entresto  24/26 mg bid to valsartan 40 mg twice daily once BMP results As of now, continue taking Jardiance  10 mg daily,  carvedilol  12.5 mg twice daily, Entresto  24/26 mg twice daily, spironolactone  12.5 mg every morning  Patient to keep record of BP readings with heart rate and report to us  at the next visit Patient to see PharmD in 4 weeks for follow up     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 -     BMP8  Cardiomyopathy, unspecified type (HCC) -     Spironolactone ; Take 0.5 tablets (12.5 mg total) by mouth in the morning.       Thank you   Robbi Blanch, Pharm.D [facility] A Division of  [facility] [address] [place], [place] [address]  Phone: [phone]; Fax: [phone]

## 2024-02-11 NOTE — Assessment & Plan Note (Signed)
 Assessment: BP is controlled in office BP 12176 mmHg below the goal (<130/80). Home BP readings are largely soft with a few SBP <100 mmHg Patient may benefit from de escalation from Entresto  to valsartan  Last BMP (3 months ago): Scr: 1.51, K: 4.9 Tolerates Jardiance  10 mg daily,  carvedilol  12.5 mg twice daily, Entresto  24/26 mg twice daily, spironolactone  12.5 mg every morning well without any side effects Denies SOB, palpitation, chest pain, headaches,or swelling Reiterated the importance of regular exercise and low salt diet   Plan:  Order BMP today  Tentative plan to switch from Entresto  24/26 mg bid to valsartan 40 mg twice daily once BMP results As of now, continue taking Jardiance  10 mg daily,  carvedilol  12.5 mg twice daily, Entresto  24/26 mg twice daily, spironolactone  12.5 mg every morning  Patient to keep record of BP readings with heart rate and report to us  at the next visit Patient to see PharmD in 4 weeks for follow up

## 2024-02-12 ENCOUNTER — Telehealth: Payer: Self-pay | Admitting: Pharmacist

## 2024-02-12 DIAGNOSIS — I10 Essential (primary) hypertension: Secondary | ICD-10-CM

## 2024-02-12 LAB — BMP8
BUN: 12 mg/dL (ref 8–27)
CO2: 24 mmol/L (ref 20–29)
Calcium: 10.3 mg/dL (ref 8.7–10.3)
Chloride: 104 mmol/L (ref 96–106)
Creatinine, Ser: 1.4 mg/dL — AB (ref 0.57–1.00)
Glucose: 109 mg/dL — AB (ref 70–99)
Potassium: 5.5 mmol/L — ABNORMAL HIGH (ref 3.5–5.2)
Sodium: 141 mmol/L (ref 134–144)
eGFR: 38 mL/min/1.73 — AB (ref >59)

## 2024-02-12 LAB — ECHOCARDIOGRAM COMPLETE
Area-P 1/2: 3.91 cm2
S' Lateral: 3.43 cm

## 2024-02-12 MED ORDER — VALSARTAN 40 MG PO TABS: 40.0000 mg | ORAL_TABLET | Freq: Two times a day (BID) | ORAL | 3 refills | Status: AC

## 2024-02-12 NOTE — Telephone Encounter (Signed)
 Spoke with the patient via phone to review recent lab results. Clinical Updates:  Renal function has shown improvement. Potassium levels elevated -- advised patient to reduce intake of potassium-rich foods.  Medication Adjustment:  Due to episodes of hypotension, advised patient to discontinue Entresto . Initiated Valsartan 40 mg twice daily, starting tomorrow morning. Valsartan prescription sent to preferred pharmacy BMP due on Oct 9   Patient expressed appreciation for the phone call and guidance provided.

## 2024-02-12 NOTE — Telephone Encounter (Signed)
 Patient returned call

## 2024-02-12 NOTE — Addendum Note (Signed)
 Addended by: Aiden Helzer K on: 02/12/2024 11:16 AM   Modules accepted: Orders

## 2024-02-12 NOTE — Telephone Encounter (Signed)
 Call to discuss lab results. N/A can not leave message phone just rings and rings then get disconnected. Tried home and cell phone

## 2024-02-13 DIAGNOSIS — D472 Monoclonal gammopathy: Secondary | ICD-10-CM | POA: Diagnosis not present

## 2024-02-13 DIAGNOSIS — I129 Hypertensive chronic kidney disease with stage 1 through stage 4 chronic kidney disease, or unspecified chronic kidney disease: Secondary | ICD-10-CM | POA: Diagnosis not present

## 2024-02-13 DIAGNOSIS — N1832 Chronic kidney disease, stage 3b: Secondary | ICD-10-CM | POA: Diagnosis not present

## 2024-02-13 DIAGNOSIS — E1122 Type 2 diabetes mellitus with diabetic chronic kidney disease: Secondary | ICD-10-CM | POA: Diagnosis not present

## 2024-02-13 DIAGNOSIS — I5020 Unspecified systolic (congestive) heart failure: Secondary | ICD-10-CM | POA: Diagnosis not present

## 2024-02-13 DIAGNOSIS — N2581 Secondary hyperparathyroidism of renal origin: Secondary | ICD-10-CM | POA: Diagnosis not present

## 2024-02-15 ENCOUNTER — Telehealth: Payer: Self-pay | Admitting: Pharmacist

## 2024-02-15 NOTE — Telephone Encounter (Signed)
 Patient was confuse about her meds. Clarify all questions. Reminded for follow up BMP on Oct 9,2025.

## 2024-02-19 ENCOUNTER — Ambulatory Visit: Payer: Self-pay | Admitting: Cardiology

## 2024-02-20 ENCOUNTER — Other Ambulatory Visit (HOSPITAL_COMMUNITY): Payer: Self-pay

## 2024-02-20 DIAGNOSIS — N2581 Secondary hyperparathyroidism of renal origin: Secondary | ICD-10-CM

## 2024-02-22 ENCOUNTER — Ambulatory Visit: Payer: Self-pay | Admitting: Pharmacist

## 2024-02-22 LAB — BASIC METABOLIC PANEL WITH GFR
BUN/Creatinine Ratio: 14 (ref 12–28)
BUN: 20 mg/dL (ref 8–27)
CO2: 24 mmol/L (ref 20–29)
Calcium: 10.1 mg/dL (ref 8.7–10.3)
Chloride: 104 mmol/L (ref 96–106)
Creatinine, Ser: 1.39 mg/dL — ABNORMAL HIGH (ref 0.57–1.00)
Glucose: 99 mg/dL (ref 70–99)
Potassium: 4.7 mmol/L (ref 3.5–5.2)
Sodium: 140 mmol/L (ref 134–144)
eGFR: 39 mL/min/1.73 — ABNORMAL LOW (ref >59)

## 2024-02-25 ENCOUNTER — Telehealth (HOSPITAL_COMMUNITY): Payer: Self-pay | Admitting: Internal Medicine

## 2024-02-25 NOTE — Telephone Encounter (Signed)
 Called to confirm/remind patient of their appointment at the [facility] on 02/25/24.   Appointment:   [] Confirmed  [x] Left mess   [] No answer/No voice mail  [] VM Full/unable to leave message  [] Phone not in service  Patient reminded to bring all medications and/or complete list.  Confirmed patient has transportation. Gave directions, instructed to utilize valet parking.

## 2024-02-26 ENCOUNTER — Ambulatory Visit (HOSPITAL_COMMUNITY)
Admission: RE | Admit: 2024-02-26 | Discharge: 2024-02-26 | Disposition: A | Discharge status: Home / Self Care | Source: Ambulatory Visit | Attending: Internal Medicine | Admitting: Internal Medicine

## 2024-02-26 VITALS — BP 128/74 | HR 83 | Wt 148.0 lb

## 2024-02-26 DIAGNOSIS — Z634 Disappearance and death of family member: Secondary | ICD-10-CM | POA: Insufficient documentation

## 2024-02-26 DIAGNOSIS — N189 Chronic kidney disease, unspecified: Secondary | ICD-10-CM | POA: Insufficient documentation

## 2024-02-26 DIAGNOSIS — I428 Other cardiomyopathies: Secondary | ICD-10-CM | POA: Insufficient documentation

## 2024-02-26 DIAGNOSIS — Z79899 Other long term (current) drug therapy: Secondary | ICD-10-CM | POA: Insufficient documentation

## 2024-02-26 DIAGNOSIS — I5022 Chronic systolic (congestive) heart failure: Secondary | ICD-10-CM

## 2024-02-26 DIAGNOSIS — E039 Hypothyroidism, unspecified: Secondary | ICD-10-CM | POA: Diagnosis not present

## 2024-02-26 DIAGNOSIS — K2100 Gastro-esophageal reflux disease with esophagitis, without bleeding: Secondary | ICD-10-CM | POA: Diagnosis not present

## 2024-02-26 DIAGNOSIS — D472 Monoclonal gammopathy: Secondary | ICD-10-CM | POA: Diagnosis not present

## 2024-02-26 DIAGNOSIS — Z86718 Personal history of other venous thrombosis and embolism: Secondary | ICD-10-CM | POA: Diagnosis not present

## 2024-02-26 DIAGNOSIS — E7800 Pure hypercholesterolemia, unspecified: Secondary | ICD-10-CM | POA: Insufficient documentation

## 2024-02-26 DIAGNOSIS — E1122 Type 2 diabetes mellitus with diabetic chronic kidney disease: Secondary | ICD-10-CM | POA: Insufficient documentation

## 2024-02-26 DIAGNOSIS — Z7984 Long term (current) use of oral hypoglycemic drugs: Secondary | ICD-10-CM | POA: Insufficient documentation

## 2024-02-26 DIAGNOSIS — I10 Essential (primary) hypertension: Secondary | ICD-10-CM

## 2024-02-26 DIAGNOSIS — F3289 Other specified depressive episodes: Secondary | ICD-10-CM | POA: Insufficient documentation

## 2024-02-26 DIAGNOSIS — I130 Hypertensive heart and chronic kidney disease with heart failure and stage 1 through stage 4 chronic kidney disease, or unspecified chronic kidney disease: Secondary | ICD-10-CM | POA: Diagnosis not present

## 2024-02-26 LAB — BASIC METABOLIC PANEL WITH GFR
Anion gap: 8 (ref 5–15)
BUN: 14 mg/dL (ref 8–23)
CO2: 25 mmol/L (ref 22–32)
Calcium: 9.7 mg/dL (ref 8.9–10.3)
Chloride: 101 mmol/L (ref 98–111)
Creatinine, Ser: 1.34 mg/dL — ABNORMAL HIGH (ref 0.44–1.00)
GFR, Estimated: 40 mL/min — ABNORMAL LOW (ref >60)
Glucose, Bld: 118 mg/dL — ABNORMAL HIGH (ref 70–99)
Potassium: 5.1 mmol/L (ref 3.5–5.1)
Sodium: 134 mmol/L — ABNORMAL LOW (ref 135–145)

## 2024-02-26 LAB — BRAIN NATRIURETIC PEPTIDE: B Natriuretic Peptide: 125.2 pg/mL — ABNORMAL HIGH (ref 0.0–100.0)

## 2024-02-26 NOTE — Progress Notes (Signed)
 ADVANCED HF CLINIC NOTE  Referring Physician: Joshua Debby CROME, MD Primary Care: Joshua Debby CROME, MD Primary Cardiologist: Dr. Michele  HPI:  Katie [name] is a 78 y.o. female with a past medical history of cardiomyopathy, history of DVT, GERD, hypertension, chronic kidney disease, hypothyroidism, hyperlipidemia, prediabetes and MGUS.  She has been followed by Dr. Michele for progressively falling EF. She has been resistant to a formal w/u. Referred by Dr. Michele for HF evaluation.    Echo 1/22 EF 59% Lexiscan  2/22: EF 42% normal perfusion Echo 3/23 EF 45-50% Echo 9/24 EF 20-25%      Bone marrow biopsy demonstrated small clone of plasma cells measuring about 3 to 5%, normal FISH testing, normal cytogenetics. 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  cMRI 12/24  LVEF 39% RV 45% RV insertion site LGE  Echo 9/25 EF 30-35% RV moderately HK   Here for routine f/u with her friend. Remains active around the house. No SOB, orthopnea or PND. Compliant with meds. Depression over her husband's death improving   Past Medical History:  Diagnosis Date   Acute venous embolism and thrombosis of unspecified deep vessels of lower extremity 2001   RLE DVT and PE, coumadin x 41mo, now ASSA   Allergy    Anxiety state, unspecified    Blood transfusion without reported diagnosis    Cataract    Chronic idiopathic constipation 11/02/2016   Clotting disorder    Degeneration of cervical intervertebral disc    Gallstones    History of DVT (deep vein thrombosis)    Iron deficiency anemia secondary to blood loss (chronic)    Irritable bowel syndrome    Lumbago    Other B-complex deficiencies    Personal history of colonic polyps    adenomatous   Pure hypercholesterolemia    Reflux esophagitis    Tubular adenoma of colon 09/2002   Type II or unspecified type diabetes mellitus without mention of complication, not stated as uncontrolled    Unspecified essential hypertension    Unspecified hypothyroidism     Unspecified iridocyclitis    Unspecified venous (peripheral) insufficiency    Unspecified vitamin D  deficiency     Current Outpatient Medications  Medication Sig Dispense Refill   ACCU-CHEK GUIDE TEST test strip SMARTSIG:Strip(s)     ALPRAZolam  (XANAX ) 0.5 MG tablet TAKE 1 TABLET BY MOUTH THREE TIMES DAILY AS NEEDED FOR ANXIETY OR SLEEP 270 tablet 0   aspirin  81 MG chewable tablet Chew 81 mg by mouth every morning.     carvedilol  (COREG ) 6.25 MG tablet Take 1 tablet (6.25 mg total) by mouth 2 (two) times daily. 180 tablet 3   Cholecalciferol  50 MCG (2000 UT) TABS Take 1 tablet (2,000 Units total) by mouth daily. 90 tablet 1   cyanocobalamin  (VITAMIN B12) 1000 MCG/ML injection INJECT 1 ML  INTRAMUSCULARLY ONCE EVERY MONTH 3 mL 0   empagliflozin  (JARDIANCE ) 10 MG TABS tablet Take 1 tablet (10 mg total) by mouth in the morning.     levothyroxine  (SYNTHROID ) 75 MCG tablet Take 1 tablet (75 mcg total) by mouth daily. 90 tablet 3   magnesium hydroxide (DULCOLAX) 400 MG/5ML suspension Take 15 mLs by mouth daily as needed for mild constipation.     MAXITROL 3.5-10000-0.1 OINT Place 1 Application into the left eye 3 (three) times daily.     omeprazole (PRILOSEC) 40 MG capsule Take 40 mg by mouth daily.     Pharmacist Choice Lancets MISC      Polyethyl Glycol-Propyl  Glycol (SYSTANE) 0.4-0.3 % SOLN Place 1 drop into both eyes daily as needed (dry eyes).     rosuvastatin  (CRESTOR ) 10 MG tablet TAKE 1 TABLET BY MOUTH IN THE  MORNING 90 tablet 3   spironolactone  (ALDACTONE ) 25 MG tablet Take 0.5 tablets (12.5 mg total) by mouth in the morning.     valsartan (DIOVAN) 40 MG tablet Take 1 tablet (40 mg total) by mouth 2 (two) times daily. 180 tablet 3   mirtazapine  (REMERON  SOL-TAB) 15 MG disintegrating tablet Take 15 mg by mouth at bedtime as needed. (Patient not taking: Reported on 02/26/2024)     Omega-3 Fatty Acids (FISH OIL PO) Take 1,400 mg by mouth daily. (Patient not taking: Reported on 02/26/2024)      traZODone  (DESYREL ) 50 MG tablet Take 1 tablet (50 mg total) by mouth at bedtime. (Patient not taking: Reported on 02/26/2024) 90 tablet 0   No current facility-administered medications for this encounter.    Allergies  Allergen Reactions   Lisinopril  Other (See Comments) and Cough    Cough and throat clearing   Metoprolol Succinate Other (See Comments)    Hypotension   Penicillins Swelling    Has patient had a PCN reaction causing immediate rash, facial/tongue/throat swelling, SOB or lightheadedness with hypotension: Yes Has patient had a PCN reaction causing severe rash involving mucus membranes or skin necrosis: No Has patient had a PCN reaction that required hospitalization: No Has patient had a PCN reaction occurring within the last 10 years: No If all of the above answers are NO, then may proceed with Cephalosporin use.    Sulfamethoxazole Other (See Comments)    Unknown reaction   Sulfamethoxazole-Trimethoprim Other (See Comments)    Unknown reaction   Trimethoprim Other (See Comments)    Unknown reaction   Metformin Nausea Only      Social History   Socioeconomic History   Marital status: Married    Spouse name: Natlie Asfour   Number of children: 0   Years of education: masters   Highest education level: Not on file  Occupational History   Occupation: record Chief Executive Officer: [employer]  Tobacco Use   Smoking status: Never   Smokeless tobacco: Never  Vaping Use   Vaping status: Never Used  Substance and Sexual Activity   Alcohol use: Yes    Alcohol/week: 0.0 standard drinks of alcohol    Comment: occasional-wine    Drug use: No   Sexual activity: Not Currently  Other Topics Concern   Not on file  Social History Narrative   Lives with husband in a 2 story home with a basement.  Has no children.     Retired from the police department.     Education: masters (guidance and counseling).    Social Drivers of Corporate investment banker Strain:  Low Risk  (02/26/2023)   Overall Financial Resource Strain (CARDIA)    Difficulty of Paying Living Expenses: Not hard at all  Food Insecurity: No Food Insecurity (08/20/2023)   Hunger Vital Sign    Worried About Running Out of Food in the Last Year: Never true    Ran Out of Food in the Last Year: Never true  Transportation Needs: No Transportation Needs (08/20/2023)   PRAPARE - Administrator, Civil Service (Medical): No    Lack of Transportation (Non-Medical): No  Physical Activity: Sufficiently Active (02/26/2023)   Exercise Vital Sign    Days of Exercise per Week: 3 days  Minutes of Exercise per Session: 60 min  Stress: No Stress Concern Present (02/26/2023)   [employer] of Occupational Health - Occupational Stress Questionnaire    Feeling of Stress : Not at all  Social Connections: Socially Integrated (02/26/2023)   Social Connection and Isolation Panel    Frequency of Communication with Friends and Family: More than three times a week    Frequency of Social Gatherings with Friends and Family: More than three times a week    Attends Religious Services: More than 4 times per year    Active Member of [employer] or Organizations: Yes    Attends Engineer, structural: More than 4 times per year    Marital Status: Married  Catering manager Violence: Not At Risk (08/20/2023)   Humiliation, Afraid, Rape, and Kick questionnaire    Fear of Current or Ex-Partner: No    Emotionally Abused: No    Physically Abused: No    Sexually Abused: No      Family History  Problem Relation Age of Onset   Prostate cancer Father    Diabetes Mother        retinopathy/blind   Deep vein thrombosis Mother    Hypertension Mother    Healthy Sister    Colon cancer Neg Hx    Esophageal cancer Neg Hx    Stomach cancer Neg Hx    Rectal cancer Neg Hx     Vitals:   02/26/24 1430  BP: 128/74  Pulse: 83  SpO2: 95%  Weight: 67.1 kg (148 lb)    PHYSICAL EXAM: General:  Well  appearing. No resp difficulty HEENT: normal Neck: supple. no JVD. Carotids 2+ bilat; no bruits. No lymphadenopathy or thryomegaly appreciated. Cor: PMI nondisplaced. Regular rate & rhythm. No rubs, gallops or murmurs. Lungs: clear Abdomen: soft, nontender, nondistended. No hepatosplenomegaly. No bruits or masses. Good bowel sounds. Extremities: no cyanosis, clubbing, rash, edema Neuro: alert & orientedx3, cranial nerves grossly intact. moves all 4 extremities w/o difficulty. Affect pleasant   ASSESSMENT & PLAN:  1. Chronic systolic HF due to NICM - Echo 1/22 EF 59% - Lexiscan  2/22: EF 42% normal perfusion - Echo 3/23 EF 45-50% - Echo 9/24 EF 20-25%  - cMRI 12/24 EF 39% RV insertion site LGE. Otherwise ok  - Echo 9/25 EF 30-35% RV moderately HK  - Doing well NYHA I-II - Volume ok  - Continue Jardiance  10 - Continue spironolactone  12.5 mg daily (recent K 4.7 was as high as 5.5) - Continue valsartan 40 bid - Continue carvedilol  6.25 bid - cMRI reassuring. EF stable/improved. No evidence of infiltrative process. Suspect DCM. Will continue medical therapy. - With EF 39% on cMRI will not pursue ICD a this time   2.  Hypertension - Blood pressure well controlled. Continue current regimen.  3. MGUS - followed bu Hematology/Oncology - no evidence of cardiac amyloid on cMRI  Toribio Fuel, MD  2:44 PM

## 2024-02-26 NOTE — Patient Instructions (Signed)
 There has been no changes to your medications.  Labs done today, your results will be available in MyChart, we will contact you for abnormal readings.  Your physician recommends that you schedule a follow-up appointment in: 4 months.  If you have any questions or concerns before your next appointment please send [name] a message through Aristes or call our office at [phone].    TO LEAVE A MESSAGE FOR THE NURSE SELECT OPTION 2, PLEASE LEAVE A MESSAGE INCLUDING: YOUR NAME DATE OF BIRTH CALL BACK NUMBER REASON FOR CALL**this is important as we prioritize the call backs  YOU WILL RECEIVE A CALL BACK THE SAME DAY AS LONG AS YOU CALL BEFORE 4:00 PM  At the [facility], you and your health needs are our priority. As part of our continuing mission to provide you with exceptional heart care, we have created designated Provider Care Teams. These Care Teams include your primary Cardiologist (physician) and Advanced Practice Providers (APPs- Physician Assistants and Nurse Practitioners) who all work together to provide you with the care you need, when you need it.   You may see any of the following providers on your designated Care Team at your next follow up: Dr Arvilla Meres Dr Marca Ancona Dr. Dorthula Nettles Dr. Clearnce Hasten Amy Filbert Schilder, NP Robbie Lis, [place] [facility] [place], [place] Brynda Peon, NP [place] Lee, NP Clarisa Kindred, NP Karle Plumber, PharmD Enos Fling, PharmD   Please be sure to bring in all your medications bottles to every appointment.    Thank you for choosing [facility]

## 2024-02-26 NOTE — Addendum Note (Signed)
 Encounter addended by: Marcelina Lisa HERO, RN on: 02/26/2024 3:13 PM  Actions taken: Order list changed, Diagnosis association updated, Clinical Note Signed, Charge Capture section accepted

## 2024-02-27 ENCOUNTER — Encounter (HOSPITAL_COMMUNITY): Admitting: Internal Medicine

## 2024-02-27 ENCOUNTER — Ambulatory Visit: Payer: Medicare Other

## 2024-02-27 VITALS — Ht 65.0 in | Wt 148.0 lb

## 2024-02-27 DIAGNOSIS — Z0000 Encounter for general adult medical examination without abnormal findings: Secondary | ICD-10-CM | POA: Diagnosis not present

## 2024-02-27 NOTE — Progress Notes (Signed)
 Subjective:   Katie [name] is a 78 y.o. who presents for a Medicare Wellness preventive visit.  As a reminder, Annual Wellness Visits don't include a physical exam, and some assessments may be limited, especially if this visit is performed virtually. We may recommend an in-person follow-up visit with your provider if needed.  Visit Complete: Virtual I connected with  Katie [name] on 02/27/24 by a audio enabled telemedicine application and verified that I am speaking with the correct person using two identifiers.  Patient Location: Home  Provider Location: Office/Clinic  I discussed the limitations of evaluation and management by telemedicine. The patient expressed understanding and agreed to proceed.  Vital Signs: Because this visit was a virtual/telehealth visit, some criteria may be missing or patient reported. Any vitals not documented were not able to be obtained and vitals that have been documented are patient reported.  VideoDeclined- This patient declined Librarian, academic. Therefore the visit was completed with audio only.  Persons Participating in Visit: Patient.  AWV Questionnaire: No: Patient Medicare AWV questionnaire was not completed prior to this visit.  Cardiac Risk Factors include: advanced age (>25men, >2 women);diabetes mellitus;dyslipidemia;hypertension     Objective:    Today's Vitals   02/27/24 1321  Weight: 148 lb (67.1 kg)  Height: 5' 5 (1.651 m)   Body mass index is 24.63 kg/m.     02/27/2024    1:21 PM 02/26/2023   11:21 AM 02/23/2022   10:51 AM 02/18/2021    4:14 PM 08/11/2020   10:59 AM 10/30/2019   10:03 AM 04/24/2018   10:06 AM  Advanced Directives  Does Patient Have a Medical Advance Directive? No No No No No Yes Yes   Type of Advance Directive      Living will Living will  Does patient want to make changes to medical advance directive?      No - Patient declined   Would patient like information on creating a  medical advance directive? No - Patient declined No - Patient declined No - Patient declined No - Patient declined Yes (MAU/Ambulatory/Procedural Areas - Information given)       Data saved with a previous flowsheet row definition    Current Medications (verified) Outpatient Encounter Medications as of 02/27/2024  Medication Sig   ACCU-CHEK GUIDE TEST test strip SMARTSIG:Strip(s)   ALPRAZolam  (XANAX ) 0.5 MG tablet TAKE 1 TABLET BY MOUTH THREE TIMES DAILY AS NEEDED FOR ANXIETY OR SLEEP   aspirin  81 MG chewable tablet Chew 81 mg by mouth every morning.   carvedilol  (COREG ) 6.25 MG tablet Take 1 tablet (6.25 mg total) by mouth 2 (two) times daily.   Cholecalciferol  50 MCG (2000 UT) TABS Take 1 tablet (2,000 Units total) by mouth daily.   cyanocobalamin  (VITAMIN B12) 1000 MCG/ML injection INJECT 1 ML  INTRAMUSCULARLY ONCE EVERY MONTH   empagliflozin  (JARDIANCE ) 10 MG TABS tablet Take 1 tablet (10 mg total) by mouth in the morning.   levothyroxine  (SYNTHROID ) 75 MCG tablet Take 1 tablet (75 mcg total) by mouth daily.   magnesium hydroxide (DULCOLAX) 400 MG/5ML suspension Take 15 mLs by mouth daily as needed for mild constipation.   MAXITROL 3.5-10000-0.1 OINT Place 1 Application into the left eye 3 (three) times daily.   omeprazole (PRILOSEC) 40 MG capsule Take 40 mg by mouth daily.   Pharmacist Choice Lancets MISC    Polyethyl Glycol-Propyl Glycol (SYSTANE) 0.4-0.3 % SOLN Place 1 drop into both eyes daily as needed (dry eyes).   rosuvastatin  (  CRESTOR ) 10 MG tablet TAKE 1 TABLET BY MOUTH IN THE  MORNING   spironolactone  (ALDACTONE ) 25 MG tablet Take 0.5 tablets (12.5 mg total) by mouth in the morning.   valsartan (DIOVAN) 40 MG tablet Take 1 tablet (40 mg total) by mouth 2 (two) times daily.   mirtazapine  (REMERON  SOL-TAB) 15 MG disintegrating tablet Take 15 mg by mouth at bedtime as needed. (Patient not taking: Reported on 02/27/2024)   Omega-3 Fatty Acids (FISH OIL PO) Take 1,400 mg by mouth  daily. (Patient not taking: Reported on 02/27/2024)   traZODone  (DESYREL ) 50 MG tablet Take 1 tablet (50 mg total) by mouth at bedtime. (Patient not taking: Reported on 02/27/2024)   No facility-administered encounter medications on file as of 02/27/2024.    Allergies (verified) Lisinopril , Metoprolol succinate, Penicillins, Sulfamethoxazole, Sulfamethoxazole-trimethoprim, Trimethoprim, and Metformin   History: Past Medical History:  Diagnosis Date   Acute venous embolism and thrombosis of unspecified deep vessels of lower extremity 2001   RLE DVT and PE, coumadin x 85mo, now ASSA   Allergy    Anxiety state, unspecified    Blood transfusion without reported diagnosis    Cataract    Chronic idiopathic constipation 11/02/2016   Clotting disorder    Degeneration of cervical intervertebral disc    Gallstones    History of DVT (deep vein thrombosis)    Iron deficiency anemia secondary to blood loss (chronic)    Irritable bowel syndrome    Lumbago    Other B-complex deficiencies    Personal history of colonic polyps    adenomatous   Pure hypercholesterolemia    Reflux esophagitis    Tubular adenoma of colon 09/2002   Type II or unspecified type diabetes mellitus without mention of complication, not stated as uncontrolled    Unspecified essential hypertension    Unspecified hypothyroidism    Unspecified iridocyclitis    Unspecified venous (peripheral) insufficiency    Unspecified vitamin D  deficiency    Past Surgical History:  Procedure Laterality Date   BIOPSY  04/24/2018   Procedure: BIOPSY;  Surgeon: Wilhelmenia Aloha Raddle., MD;  Location: [facility];  Service: Gastroenterology;;   ORIN MEDIATE RELEASE     CATARACT EXTRACTION Bilateral 08/29/2017,09/19/17   ESOPHAGOGASTRODUODENOSCOPY (EGD) WITH PROPOFOL  N/A 04/24/2018   Procedure: ESOPHAGOGASTRODUODENOSCOPY (EGD) WITH PROPOFOL ;  Surgeon: Wilhelmenia Aloha Raddle., MD;  Location: [facility];  Service: Gastroenterology;   Laterality: N/A;  with EMR   FEMORAL HERNIA REPAIR     OOPHORECTOMY     POLYPECTOMY  04/24/2018   Procedure: POLYPECTOMY;  Surgeon: Mansouraty, Aloha Raddle., MD;  Location: [facility];  Service: Gastroenterology;;   UPPER ESOPHAGEAL ENDOSCOPIC ULTRASOUND (EUS)  04/24/2018   Procedure: UPPER ESOPHAGEAL ENDOSCOPIC ULTRASOUND (EUS);  Surgeon: Wilhelmenia Aloha Raddle., MD;  Location: [facility];  Service: Gastroenterology;;   VESICOVAGINAL FISTULA CLOSURE W/ TAH     Family History  Problem Relation Age of Onset   Prostate cancer Father    Diabetes Mother        retinopathy/blind   Deep vein thrombosis Mother    Hypertension Mother    Healthy Sister    Colon cancer Neg Hx    Esophageal cancer Neg Hx    Stomach cancer Neg Hx    Rectal cancer Neg Hx    Social History   Socioeconomic History   Marital status: Married    Spouse name: Vinia Jemmott   Number of children: 0   Years of education: masters   Highest education level: Not on file  Occupational History   Occupation: record Chief Executive Officer: [employer]  Tobacco Use   Smoking status: Never   Smokeless tobacco: Never  Vaping Use   Vaping status: Never Used  Substance and Sexual Activity   Alcohol use: Yes    Alcohol/week: 0.0 standard drinks of alcohol    Comment: occasional-wine    Drug use: No   Sexual activity: Not Currently  Other Topics Concern   Not on file  Social History Narrative   Widowed   Retired from the police department.     Education: masters (guidance and counseling).    Social Drivers of Corporate investment banker Strain: Low Risk  (02/27/2024)   Overall Financial Resource Strain (CARDIA)    Difficulty of Paying Living Expenses: Not hard at all  Food Insecurity: No Food Insecurity (02/27/2024)   Hunger Vital Sign    Worried About Running Out of Food in the Last Year: Never true    Ran Out of Food in the Last Year: Never true  Transportation Needs: No Transportation Needs (02/27/2024)    PRAPARE - Administrator, Civil Service (Medical): No    Lack of Transportation (Non-Medical): No  Physical Activity: Inactive (02/27/2024)   Exercise Vital Sign    Days of Exercise per Week: 0 days    Minutes of Exercise per Session: 0 min  Stress: Stress Concern Present (02/27/2024)   [employer] of Occupational Health - Occupational Stress Questionnaire    Feeling of Stress: Rather much  Social Connections: Moderately Integrated (02/27/2024)   Social Connection and Isolation Panel    Frequency of Communication with Friends and Family: More than three times a week    Frequency of Social Gatherings with Friends and Family: Twice a week    Attends Religious Services: More than 4 times per year    Active Member of [employer] or Organizations: Yes    Attends Banker Meetings: More than 4 times per year    Marital Status: Widowed    Tobacco Counseling Counseling given: Not Answered    Clinical Intake:  Pre-visit preparation completed: Yes  Pain : No/denies pain     BMI - recorded: 24.63 Nutritional Status: BMI of 19-24  Normal Nutritional Risks: None Diabetes: Yes CBG done?: Yes CBG resulted in Enter/ Edit results?: Yes (fasting - 95) Did pt. bring in CBG monitor from home?: No  Lab Results  Component Value Date   HGBA1C 6.2 (A) 01/30/2024   HGBA1C 6.0 (A) 09/21/2023   HGBA1C 5.6 06/04/2023     How often do you need to have someone help you when you read instructions, pamphlets, or other written materials from your doctor or pharmacy?: 1 - Never  Interpreter Needed?: No  Information entered by :: Verdie Saba, CMA   Activities of Daily Living     02/27/2024    1:24 PM  In your present state of health, do you have any difficulty performing the following activities:  Hearing? 0  Vision? 0  Difficulty concentrating or making decisions? 0  Walking or climbing stairs? 0  Dressing or bathing? 0  Doing errands, shopping? 0   Preparing Food and eating ? N  Using the Toilet? N  In the past six months, have you accidently leaked urine? N  Do you have problems with loss of bowel control? N  Managing your Medications? N  Managing your Finances? N  Housekeeping or managing your Housekeeping? N    Patient Care Team:  Joshua Debby CROME, MD as PCP - General (Internal Medicine) Michele Richardson, DO as PCP - Cardiology (Cardiology) Bensimhon, Toribio SAUNDERS, MD as PCP - Advanced Heart Failure (Cardiology) Altheimer, Ozell, MD (Endocrinology) Aneita Gwendlyn DASEN, MD (Inactive) (Gastroenterology) Zan Factor, DPM (Podiatry) Darina Barrio, MD (Gynecology) Livingston Rigg, MD as Consulting Physician (Dermatology) Fleeta Zerita DASEN, MD as Consulting Physician (Ophthalmology)  I have updated your Care Teams any recent Medical Services you may have received from other providers in the past year.     Assessment:   This is a routine wellness examination for Natanya.  Hearing/Vision screen Hearing Screening - Comments:: Denies hearing difficulties   Vision Screening - Comments:: Wears rx glasses - up to date with routine eye exams with [facility] - Dr. KYM Fleeta   Goals Addressed               This Visit's Progress     Patient Stated (pt-stated)        Patient stated she plans to manage anxiety and grieving the loss of Spouse/Sibling       Depression Screen     02/27/2024    1:26 PM 09/26/2023    3:44 PM 09/05/2023   11:18 AM 03/07/2023   11:15 AM 02/26/2023   11:24 AM 02/23/2022   10:55 AM 02/18/2021    4:12 PM  PHQ 2/9 Scores  PHQ - 2 Score 1 0 1 2 1 1  0  PHQ- 9 Score 2 0 2 5 1       Fall Risk     02/27/2024    1:25 PM 09/26/2023    3:44 PM 09/05/2023   10:31 AM 03/07/2023   11:15 AM 02/26/2023   11:22 AM  Fall Risk   Falls in the past year? 0 0 0 0 0  Number falls in past yr: 0 0 0 0 0  Injury with Fall? 0 0 0 0 0  Risk for fall due to : No Fall Risks No Fall Risks No Fall Risks No Fall Risks No Fall  Risks  Follow up Falls evaluation completed;Falls prevention discussed Falls evaluation completed Falls evaluation completed Falls evaluation completed Falls prevention discussed    MEDICARE RISK AT HOME:  Medicare Risk at Home Any stairs in or around the home?: No If so, are there any without handrails?: No Home free of loose throw rugs in walkways, pet beds, electrical cords, etc?: Yes Adequate lighting in your home to reduce risk of falls?: Yes Life alert?: No Use of a cane, walker or w/c?: No Grab bars in the bathroom?: Yes Shower chair or bench in shower?: No Elevated toilet seat or a handicapped toilet?: Yes  TIMED UP AND GO:  Was the test performed?  No  Cognitive Function: 6CIT completed    03/26/2017   11:52 AM 02/16/2016   10:58 AM  MMSE - Mini Mental State Exam  Not completed:  --  Orientation to time 5    Orientation to Place 5    Registration 3    Attention/ Calculation 4    Recall 2    Language- name 2 objects 2    Language- repeat 1   Language- follow 3 step command 3    Language- read & follow direction 1    Write a sentence 1    Copy design 1    Total score 28       Data saved with a previous flowsheet row definition        02/27/2024  1:42 PM 02/26/2023   11:31 AM 02/23/2022   10:54 AM 10/30/2019   10:05 AM  6CIT Screen  What Year? 0 points 0 points 0 points 0 points  What month? 0 points 0 points 0 points 0 points  What time? 0 points 0 points 0 points 0 points  Count back from 20 0 points 0 points 0 points 0 points  Months in reverse 0 points 0 points 0 points 0 points  Repeat phrase 0 points 0 points 0 points 0 points  Total Score 0 points 0 points 0 points 0 points    Immunizations Immunization History  Administered Date(s) Administered   Fluad Quad(high Dose 65+) 01/25/2021   INFLUENZA, HIGH DOSE SEASONAL PF 01/29/2018, 02/02/2019, 01/24/2023   Influenza Split 02/28/2012, 02/15/2013, 01/27/2014   Influenza Whole 02/13/2008,  04/07/2009   Influenza-Unspecified 03/01/2017, 01/28/2020, 01/24/2022   PFIZER(Purple Top)SARS-COV-2 Vaccination 06/21/2019, 07/16/2019, 03/27/2020   Pfizer Covid-19 Vaccine Bivalent Booster 48yrs & up 04/18/2021   Pfizer(Comirnaty)Fall Seasonal Vaccine 12 years and older 01/22/2023   Pneumococcal Conjugate-13 08/11/2014   Pneumococcal Polysaccharide-23 05/31/2011, 06/18/2017   RSV,unspecified 04/24/2022   Tdap 03/16/2015   Zoster Recombinant(Shingrix) 01/26/2017, 05/09/2017    Screening Tests Health Maintenance  Topic Date Due   Influenza Vaccine  12/14/2023   HEMOGLOBIN A1C  07/29/2024   Diabetic kidney evaluation - Urine ACR  08/08/2024   FOOT EXAM  01/29/2025   OPHTHALMOLOGY EXAM  02/07/2025   Diabetic kidney evaluation - eGFR measurement  02/25/2025   Medicare Annual Wellness (AWV)  02/26/2025   DTaP/Tdap/Td (2 - Td or Tdap) 03/15/2025   Colonoscopy  01/29/2026   Pneumococcal Vaccine: 50+ Years  Completed   DEXA SCAN  Completed   Hepatitis C Screening  Completed   Zoster Vaccines- Shingrix  Completed   Meningococcal B Vaccine  Aged Out   Mammogram  Discontinued   COVID-19 Vaccine  Discontinued    Health Maintenance Items Addressed:02/27/2024  Additional Screening:  Vision Screening: Recommended annual ophthalmology exams for early detection of glaucoma and other disorders of the eye. Is the patient up to date with their annual eye exam?  Yes  Who is the provider or what is the name of the office in which the patient attends annual eye exams? [facility]. Fle[facility]  Dental Screening: Recommended annual dental exams for proper oral hygiene  Community Resource Referral / Chronic Care Management: CRR required this visit?  Yes - Referral to the [facility] for Grief Counseling due to the lost of Spouse/Sibling  CCM required this visit?  No   Plan:    I have personally reviewed and noted the following in the patient's chart:   Medical and social  history Use of alcohol, tobacco or illicit drugs  Current medications and supplements including opioid prescriptions. Patient is not currently taking opioid prescriptions. Functional ability and status Nutritional status Physical activity Advanced directives List of other physicians Hospitalizations, surgeries, and ER visits in previous 12 months Vitals Screenings to include cognitive, depression, and falls Referrals and appointments  In addition, I have reviewed and discussed with patient certain preventive protocols, quality metrics, and best practice recommendations. A written personalized care plan for preventive services as well as general preventive health recommendations were provided to patient.   Verdie CHRISTELLA Saba, CMA   02/27/2024   After Visit Summary: (MyChart) Due to this being a telephonic visit, the after visit summary with patients personalized plan was offered to patient via MyChart   Notes: Referral  for Grief Counseling

## 2024-02-27 NOTE — Patient Instructions (Addendum)
 Ms. Bahner,  Thank you for taking the time for your Medicare Wellness Visit. I appreciate your continued commitment to your health goals. Please review the care plan we discussed, and feel free to reach out if I can assist you further.  Medicare recommends these wellness visits once per year to help you and your care team stay ahead of potential health issues. These visits are designed to focus on prevention, allowing your provider to concentrate on managing your acute and chronic conditions during your regular appointments.  Please note that Annual Wellness Visits do not include a physical exam. Some assessments may be limited, especially if the visit was conducted virtually. If needed, we may recommend a separate in-person follow-up with your provider.  Ongoing Care Seeing your primary care provider every 3 to 6 months helps us  monitor your health and provide consistent, personalized care.   Referrals If a referral was made during today's visit and you haven't received any updates within two weeks, please contact the referred provider directly to check on the status.  Recommended Screenings:  Health Maintenance  Topic Date Due   Flu Shot  12/14/2023   Hemoglobin A1C  07/29/2024   Yearly kidney health urinalysis for diabetes  08/08/2024   Complete foot exam   01/29/2025   Eye exam for diabetics  02/07/2025   Yearly kidney function blood test for diabetes  02/25/2025   Medicare Annual Wellness Visit  02/26/2025   DTaP/Tdap/Td vaccine (2 - Td or Tdap) 03/15/2025   Colon Cancer Screening  01/29/2026   Pneumococcal Vaccine for age over 76  Completed   DEXA scan (bone density measurement)  Completed   Hepatitis C Screening  Completed   Zoster (Shingles) Vaccine  Completed   Meningitis B Vaccine  Aged Out   Breast Cancer Screening  Discontinued   COVID-19 Vaccine  Discontinued       02/27/2024    1:21 PM  Advanced Directives  Does Patient Have a Medical Advance Directive? No  Would  patient like information on creating a medical advance directive? No - Patient declined   Advance Care Planning is important because it: Ensures you receive medical care that aligns with your values, goals, and preferences. Provides guidance to your family and loved ones, reducing the emotional burden of decision-making during critical moments.  Vision: Annual vision screenings are recommended for early detection of glaucoma, cataracts, and diabetic retinopathy. These exams can also reveal signs of chronic conditions such as diabetes and high blood pressure.  Dental: Annual dental screenings help detect early signs of oral cancer, gum disease, and other conditions linked to overall health, including heart disease and diabetes.

## 2024-02-29 DIAGNOSIS — G5762 Lesion of plantar nerve, left lower limb: Secondary | ICD-10-CM | POA: Diagnosis not present

## 2024-02-29 DIAGNOSIS — E1151 Type 2 diabetes mellitus with diabetic peripheral angiopathy without gangrene: Secondary | ICD-10-CM | POA: Diagnosis not present

## 2024-02-29 DIAGNOSIS — M65871 Other synovitis and tenosynovitis, right ankle and foot: Secondary | ICD-10-CM | POA: Diagnosis not present

## 2024-02-29 DIAGNOSIS — L603 Nail dystrophy: Secondary | ICD-10-CM | POA: Diagnosis not present

## 2024-02-29 DIAGNOSIS — G5761 Lesion of plantar nerve, right lower limb: Secondary | ICD-10-CM | POA: Diagnosis not present

## 2024-03-03 DIAGNOSIS — E1151 Type 2 diabetes mellitus with diabetic peripheral angiopathy without gangrene: Secondary | ICD-10-CM | POA: Diagnosis not present

## 2024-03-03 DIAGNOSIS — G5762 Lesion of plantar nerve, left lower limb: Secondary | ICD-10-CM | POA: Diagnosis not present

## 2024-03-03 DIAGNOSIS — L603 Nail dystrophy: Secondary | ICD-10-CM | POA: Diagnosis not present

## 2024-03-03 DIAGNOSIS — I70203 Unspecified atherosclerosis of native arteries of extremities, bilateral legs: Secondary | ICD-10-CM | POA: Diagnosis not present

## 2024-03-04 ENCOUNTER — Other Ambulatory Visit: Payer: Self-pay | Admitting: Internal Medicine

## 2024-03-04 DIAGNOSIS — E538 Deficiency of other specified B group vitamins: Secondary | ICD-10-CM

## 2024-03-06 ENCOUNTER — Ambulatory Visit: Admitting: Internal Medicine

## 2024-03-06 ENCOUNTER — Encounter: Payer: Self-pay | Admitting: Internal Medicine

## 2024-03-06 VITALS — BP 122/72 | HR 89 | Temp 97.7°F | Resp 16 | Ht 65.0 in | Wt 144.2 lb

## 2024-03-06 DIAGNOSIS — F411 Generalized anxiety disorder: Secondary | ICD-10-CM | POA: Diagnosis not present

## 2024-03-06 DIAGNOSIS — E1122 Type 2 diabetes mellitus with diabetic chronic kidney disease: Secondary | ICD-10-CM | POA: Diagnosis not present

## 2024-03-06 DIAGNOSIS — I70203 Unspecified atherosclerosis of native arteries of extremities, bilateral legs: Secondary | ICD-10-CM | POA: Insufficient documentation

## 2024-03-06 DIAGNOSIS — N1831 Chronic kidney disease, stage 3a: Secondary | ICD-10-CM

## 2024-03-06 DIAGNOSIS — N183 Chronic kidney disease, stage 3 unspecified: Secondary | ICD-10-CM

## 2024-03-06 DIAGNOSIS — N1832 Chronic kidney disease, stage 3b: Secondary | ICD-10-CM

## 2024-03-06 MED ORDER — ALPRAZOLAM 0.5 MG PO TABS: ORAL_TABLET | ORAL | 1 refills | Status: AC

## 2024-03-06 MED ORDER — MIRTAZAPINE 7.5 MG PO TABS: 7.5000 mg | ORAL_TABLET | Freq: Every day | ORAL | 0 refills | Status: AC

## 2024-03-06 NOTE — Patient Instructions (Signed)
 Mirtazapine  Disintegrating Tablets What is this medication? MIRTAZAPINE  (mir TAZ a peen) treats depression. It increases the amount of serotonin and norepinephrine in the brain, hormones that help regulate mood. This medicine may be used for other purposes; ask your health care provider or pharmacist if you have questions. COMMON BRAND NAME(S): Remeron  SolTab What should I tell my care team before I take this medication? They need to know if you have any of these conditions: Bipolar disorder Glaucoma Kidney disease Liver disease Phenylketonuria Suicidal thoughts An unusual or allergic reaction to mirtazapine , other medications, foods, dyes, or preservatives Pregnant or trying to get pregnant Breast-feeding How should I use this medication? Take this medication by mouth. Follow the directions on the prescription label. These tablets are made to dissolve in the mouth. Place the tablet in the mouth and allow it to dissolve, then swallow. You can take these tablets with water, but you do not have to. Take your medication at regular intervals. Do not take your medication more often than directed. Do not stop taking this medication suddenly except upon the advice of your care team. Stopping this medication too quickly may cause serious side effects or your condition may worsen. A special MedGuide will be given to you by the pharmacist with each prescription and refill. Be sure to read this information carefully each time. Talk to your care team about the use of this medication in children. Special care may be needed. Overdosage: If you think you have taken too much of this medicine contact a poison control center or emergency room at once. NOTE: This medicine is only for you. Do not share this medicine with others. What if I miss a dose? If you miss a dose, take it as soon as you can. If it is almost time for your next dose, take only that dose. Do not take double or extra doses. What may interact  with this medication? Do not take this medication with any of the following: Linezolid MAOIs, such as Carbex, Eldepryl, Marplan, Nardil, and Parnate Methylene blue  (injected into a vein) This medication may also interact with the following: Alcohol Antivirals for HIV or AIDS Certain medications that treat or prevent blood clots, such as warfarin Certain medications for fungal infections, such as ketoconazole  and itraconazole Certain medications for mental health conditions Certain medications for migraine headache, such as almotriptan, eletriptan, frovatriptan, naratriptan, rizatriptan, sumatriptan, zolmitriptan Certain medications for seizures, such as carbamazepine or phenytoin Certain medications for sleep Cimetidine Erythromycin Fentanyl  Lithium Medications for blood pressure Nefazodone Rasagiline Rifampin Supplements, such as St. John's wort, kava kava, valerian Tramadol Tryptophan This list may not describe all possible interactions. Give your health care provider a list of all the medicines, herbs, non-prescription drugs, or dietary supplements you use. Also tell them if you smoke, drink alcohol, or use illegal drugs. Some items may interact with your medicine. What should I watch for while using this medication? Visit your care team for regular checks on your progress. It may be some time before you see the benefit from this medication. This medication may cause thoughts of suicide or depression. This includes sudden changes in mood, behaviors, or thoughts. These changes can happen at any time but are more common in the beginning of treatment or after a change in dose. Call your care team right away if you experience these thoughts or worsening depression. This medication may affect your coordination, reaction time, or judgment. Do not drive or operate machinery until you know how this medication affects  you. Sit up or stand slowly to reduce the risk of dizzy or fainting spells.  Drinking alcohol with this medication can increase the risk of these side effects. This medication may cause dry eyes and blurred vision. If you wear contact lenses, you may feel some discomfort. Lubricating eye drops may help. See your care team if the problem does not go away or is severe. Your mouth may get dry. Chewing sugarless gum or sucking hard candy and drinking plenty of water may help. Contact your care team if the problem does not go away or is severe. What side effects may I notice from receiving this medication? Side effects that you should report to your care team as soon as possible: Allergic reactions--skin rash, itching, hives, swelling of the face, lips, tongue, or throat Heart rhythm changes--fast or irregular heartbeat, dizziness, feeling faint or lightheaded, chest pain, trouble breathing Infection--fever, chills, cough, or sore throat Irritability, confusion, fast or irregular heartbeat, muscle stiffness, twitching muscles, sweating, high fever, seizure, chills, vomiting, diarrhea, which may be signs of serotonin syndrome Low sodium level--muscle weakness, fatigue, dizziness, headache, confusion Rash, fever, and swollen lymph nodes Redness, blistering, peeling or loosening of the skin, including inside the mouth Seizures Sudden eye pain or change in vision such as blurry vision, seeing halos around lights, vision loss Thoughts of suicide or self-harm, worsening mood, feelings of depression Side effects that usually do not require medical attention (report to your care team if they continue or are bothersome): Constipation Dizziness Drowsiness Dry mouth Increase in appetite Weight gain This list may not describe all possible side effects. Call your doctor for medical advice about side effects. You may report side effects to FDA at 1-800-FDA-1088. Where should I keep my medication? Keep out of the reach of children. Store at room temperature between 15 and 30 degrees C  (59 and 86 degrees F) Protect from light and moisture. Throw away any unused medication after the expiration date. NOTE: This sheet is a summary. It may not cover all possible information. If you have questions about this medicine, talk to your doctor, pharmacist, or health care provider.  2024 Elsevier/Gold Standard (2021-12-28 00:00:00)

## 2024-03-06 NOTE — Progress Notes (Signed)
 Subjective:  Patient ID: Katie [name], female    DOB: [birth year]  Age: 78 y.o. MRN: [id number]  CC: Medical Management of Chronic Issues (6 month follow up )   HPI Katie [name] presents for f/up ---  Discussed the use of AI scribe software for clinical note transcription with the patient, who gave verbal consent to proceed.  History of Present Illness Katie [name] is a 78 year old female who presents with concerns about medication side effects and ongoing symptoms of anxiety and depression.  She experiences anxiety, worry, fear, and mild depression, which she attributes to the passing of her husband and sister about a year ago. Although these symptoms have improved, they persist to some extent. She describes nervousness in her stomach associated with anxiety and uses alprazolam , typically taking two doses a day, occasionally needing a third dose if her anxiety intensifies.  She was prescribed trazodone  at a dose of 50 mg for sleep but discontinued it due to off-balance sensations the following morning. No falls have occurred. She is considering alternative medications but is concerned about potential side effects and prefers not to take capsules.  She mentions a loss of appetite and subsequent weight loss, which she attributes to her anxiety and depression. She eats small portions to manage her nutrition.  She has been in contact with a grief therapist at her church, which has been helpful, but she continues to seek further assistance to alleviate her symptoms.  She is currently on two blood pressure medications, clonidine and another unspecified medication, after being taken off Entresto  by her cardiologist. No symptoms of heart failure, such as chest pain, shortness of breath, dizziness, or lightheadedness.  She has a scheduled thyroid  scan next month due to findings from her kidney and diabetic doctors, which will involve an injection and a subsequent scan.     Outpatient Medications  Prior to Visit  Medication Sig Dispense Refill   ACCU-CHEK GUIDE TEST test strip SMARTSIG:Strip(s)     aspirin  81 MG chewable tablet Chew 81 mg by mouth every morning.     carvedilol  (COREG ) 6.25 MG tablet Take 1 tablet (6.25 mg total) by mouth 2 (two) times daily. 180 tablet 3   Cholecalciferol  50 MCG (2000 UT) TABS Take 1 tablet (2,000 Units total) by mouth daily. 90 tablet 1   cyanocobalamin  (VITAMIN B12) 1000 MCG/ML injection INJECT 1 ML INTRAMUSCULARLY  ONCE EVERY MONTH 3 mL 0   empagliflozin  (JARDIANCE ) 10 MG TABS tablet Take 1 tablet (10 mg total) by mouth in the morning.     levothyroxine  (SYNTHROID ) 75 MCG tablet Take 1 tablet (75 mcg total) by mouth daily. 90 tablet 3   magnesium hydroxide (DULCOLAX) 400 MG/5ML suspension Take 15 mLs by mouth daily as needed for mild constipation.     MAXITROL 3.5-10000-0.1 OINT Place 1 Application into the left eye 3 (three) times daily.     omeprazole (PRILOSEC) 40 MG capsule Take 40 mg by mouth daily.     Pharmacist Choice Lancets MISC      Polyethyl Glycol-Propyl Glycol (SYSTANE) 0.4-0.3 % SOLN Place 1 drop into both eyes daily as needed (dry eyes).     rosuvastatin  (CRESTOR ) 10 MG tablet TAKE 1 TABLET BY MOUTH IN THE  MORNING 90 tablet 3   spironolactone  (ALDACTONE ) 25 MG tablet Take 0.5 tablets (12.5 mg total) by mouth in the morning.     valsartan (DIOVAN) 40 MG tablet Take 1 tablet (40 mg total) by mouth 2 (two) times daily. 180  tablet 3   ALPRAZolam  (XANAX ) 0.5 MG tablet TAKE 1 TABLET BY MOUTH THREE TIMES DAILY AS NEEDED FOR ANXIETY OR SLEEP 270 tablet 0   Omega-3 Fatty Acids (FISH OIL PO) Take 1,400 mg by mouth daily. (Patient not taking: Reported on 03/06/2024)     mirtazapine  (REMERON  SOL-TAB) 15 MG disintegrating tablet Take 15 mg by mouth at bedtime as needed. (Patient not taking: Reported on 03/06/2024)     traZODone  (DESYREL ) 50 MG tablet Take 1 tablet (50 mg total) by mouth at bedtime. (Patient not taking: Reported on 03/06/2024) 90  tablet 0   No facility-administered medications prior to visit.    ROS Review of Systems  Objective:  BP 122/72 (BP Location: Left Arm, Patient Position: Sitting, Cuff Size: Small)   Pulse 89   Temp 97.7 F (36.5 C) (Oral)   Resp 16   Ht 5' 5 (1.651 m)   Wt 144 lb 3.2 oz (65.4 kg)   SpO2 97%   BMI 24.00 kg/m   BP Readings from Last 3 Encounters:  03/06/24 122/72  02/26/24 128/74  02/11/24 121/76    Wt Readings from Last 3 Encounters:  03/06/24 144 lb 3.2 oz (65.4 kg)  02/27/24 148 lb (67.1 kg)  02/26/24 148 lb (67.1 kg)    Physical Exam  Lab Results  Component Value Date   WBC 4.8 07/09/2023   HGB 13.6 07/09/2023   HCT 38.6 07/09/2023   PLT 186 07/09/2023   GLUCOSE 118 (H) 02/26/2024   CHOL 103 09/05/2023   TRIG 55.0 09/05/2023   HDL 65.10 09/05/2023   LDLDIRECT 105.9 07/04/2007   LDLCALC 26 09/05/2023   ALT 16 07/09/2023   AST 23 07/09/2023   NA 134 (L) 02/26/2024   K 5.1 02/26/2024   CL 101 02/26/2024   CREATININE 1.34 (H) 02/26/2024   BUN 14 02/26/2024   CO2 25 02/26/2024   TSH 1.66 01/30/2024   HGBA1C 6.2 (A) 01/30/2024   MICROALBUR 0.0 05/18/2017    No results found.  Assessment & Plan:  GAD (generalized anxiety disorder) -     Mirtazapine ; Take 1 tablet (7.5 mg total) by mouth at bedtime.  Dispense: 90 tablet; Refill: 0 -     ALPRAZolam ; TAKE 1 TABLET BY MOUTH THREE TIMES DAILY AS NEEDED FOR ANXIETY OR SLEEP  Dispense: 270 tablet; Refill: 1 -     AMB Referral VBCI Care Management  Type 2 diabetes mellitus with stage 3a chronic kidney disease, without long-term current use of insulin (HCC)     Follow-up: Return in about 6 months (around 09/04/2024).  Debby Molt, MD

## 2024-03-07 ENCOUNTER — Telehealth: Payer: Self-pay | Admitting: Cardiology

## 2024-03-07 ENCOUNTER — Telehealth: Payer: Self-pay | Admitting: Pharmacist

## 2024-03-07 DIAGNOSIS — I5042 Chronic combined systolic (congestive) and diastolic (congestive) heart failure: Secondary | ICD-10-CM

## 2024-03-07 DIAGNOSIS — I429 Cardiomyopathy, unspecified: Secondary | ICD-10-CM

## 2024-03-07 MED ORDER — SPIRONOLACTONE 25 MG PO TABS: 12.5000 mg | ORAL_TABLET | Freq: Every morning | ORAL | 2 refills | Status: DC

## 2024-03-07 MED ORDER — SPIRONOLACTONE 25 MG PO TABS: 12.5000 mg | ORAL_TABLET | Freq: Every day | ORAL | 3 refills | Status: AC

## 2024-03-07 NOTE — Telephone Encounter (Signed)
*  STAT* If patient is at the pharmacy, call can be transferred to refill team.   1. Which medications need to be refilled? (please list name of each medication and dose if known)   spironolactone  (ALDACTONE ) 25 MG tablet     2. Would you like to learn more about the convenience, safety, & potential cost savings by using the [facility]? No   3. Are you open to using the [facility] (Type [facility]. ) No   4. Which pharmacy/location (including street and city if local pharmacy) is medication to be sent to? [id number] - [place] ([place]), [place] - [address]    5. Do they need a 30 day or 90 day supply? 60 day

## 2024-03-07 NOTE — Telephone Encounter (Signed)
 Pt LVM requesting to send spironolactone  prescription to [facility].

## 2024-03-07 NOTE — Telephone Encounter (Signed)
 RX sent in

## 2024-03-10 ENCOUNTER — Telehealth: Payer: Self-pay | Admitting: *Deleted

## 2024-03-10 ENCOUNTER — Other Ambulatory Visit (HOSPITAL_COMMUNITY): Payer: Self-pay

## 2024-03-10 NOTE — Progress Notes (Signed)
 Complex Care Management Note  Care Guide Note 03/10/2024 Name: Katie [name] MRN: [id number] DOB: 21-Nov-1945  Katie [name] is a 78 y.o. year old female who sees Joshua Debby CROME, MD for primary care. I reached out to [name] by phone today to offer complex care management services.  [name] was given information about Complex Care Management services today including:   The Complex Care Management services include support from the care team which includes your Nurse Care Manager, Clinical Social Worker, or Pharmacist.  The Complex Care Management team is here to help remove barriers to the health concerns and goals most important to you. Complex Care Management services are voluntary, and the patient may decline or stop services at any time by request to their care team member.   Complex Care Management Consent Status: Patient agreed to services and verbal consent obtained.   Follow up plan:  Telephone appointment with complex care management team member scheduled for:  03/28/2024  Encounter Outcome:  Patient Scheduled  Thedford Franks, CMA [place]  [facility], [facility] Guide Direct Dial: [phone]  Fax: [phone] Website: [email or web address]

## 2024-03-10 NOTE — Progress Notes (Signed)
 Complex Care Management Note Care Guide Note  03/10/2024 Name: Katie [name] MRN: [id number] DOB: 1946-05-08   Complex Care Management Outreach Attempts: An unsuccessful telephone outreach was attempted today to offer the patient information about available complex care management services.  Follow Up Plan:  Additional outreach attempts will be made to offer the patient complex care management information and services.   Encounter Outcome:  No Answer  Thedford Franks, CMA [place]  [facility], [facility] Guide Direct Dial: [phone]  Fax: [phone] Website: [email or web address]

## 2024-03-12 NOTE — Assessment & Plan Note (Signed)
 GFR is down to 30

## 2024-03-13 ENCOUNTER — Telehealth: Payer: Self-pay | Admitting: Pharmacist

## 2024-03-13 NOTE — Telephone Encounter (Signed)
 Pt requesting a c/b from pharmacist regarding medications below -   carvedilol  (COREG ) 6.25 MG tablet  spironolactone  (ALDACTONE ) 25 MG tablet   valsartan (DIOVAN) 40 MG tablet

## 2024-03-17 ENCOUNTER — Telehealth: Payer: Self-pay | Admitting: Pharmacy Technician

## 2024-03-17 ENCOUNTER — Encounter: Payer: Self-pay | Admitting: Radiology

## 2024-03-17 NOTE — Telephone Encounter (Signed)
 Hw Katie [name] will be expiring Nov 2025 will route med assist team to re-enroll patient in the grant

## 2024-03-17 NOTE — Telephone Encounter (Addendum)
    Patient Advocate Encounter   The patient was approved for a [employer] grant that will help cover the cost of jardiance , spironolactone , valsartan -she is on this not losartan  Total amount awarded, 7500.  Effective: 03/31/24 - 03/30/25   APW:[id number] ERW:[id number] Hmnle:[id number] [id number] [employer] ID: [id number]   Pharmacy provided with approval and processing information. Patient informed via mychart

## 2024-03-18 ENCOUNTER — Ambulatory Visit: Admitting: Pharmacist

## 2024-03-26 ENCOUNTER — Ambulatory Visit (HOSPITAL_COMMUNITY)
Admission: RE | Admit: 2024-03-26 | Discharge: 2024-03-26 | Disposition: A | Discharge status: Home / Self Care | Source: Ambulatory Visit

## 2024-03-26 ENCOUNTER — Ambulatory Visit (HOSPITAL_COMMUNITY)

## 2024-03-26 DIAGNOSIS — N2581 Secondary hyperparathyroidism of renal origin: Secondary | ICD-10-CM | POA: Diagnosis present

## 2024-03-26 MED ORDER — TECHNETIUM TC 99M SESTAMIBI GENERIC - CARDIOLITE
25.0000 | Freq: Once | INTRAVENOUS | Status: AC
  Administered 2024-03-26: 24.5 via INTRAVENOUS

## 2024-03-28 ENCOUNTER — Other Ambulatory Visit: Payer: Self-pay | Admitting: *Deleted

## 2024-03-28 NOTE — Patient Outreach (Signed)
 Complex Care Management   Visit Note  03/28/2024  Name:  Katie [name] MRN: [id number] DOB: 12/07/1945  Situation: Referral received for Complex Care Management related to Mental/Behavioral Health diagnosis Anxiety/stress I obtained verbal consent from Patient.  Visit completed with Patient  on the phone  Background:   Past Medical History:  Diagnosis Date   Acute venous embolism and thrombosis of unspecified deep vessels of lower extremity 2001   RLE DVT and PE, coumadin x 44mo, now ASSA   Allergy    Anxiety state, unspecified    Blood transfusion without reported diagnosis    Cataract    Chronic idiopathic constipation 11/02/2016   Clotting disorder    Degeneration of cervical intervertebral disc    Gallstones    History of DVT (deep vein thrombosis)    Iron deficiency anemia secondary to blood loss (chronic)    Irritable bowel syndrome    Lumbago    Other B-complex deficiencies    Personal history of colonic polyps    adenomatous   Pure hypercholesterolemia    Reflux esophagitis    Tubular adenoma of colon 09/2002   Type II or unspecified type diabetes mellitus without mention of complication, not stated as uncontrolled    Unspecified essential hypertension    Unspecified hypothyroidism    Unspecified iridocyclitis    Unspecified venous (peripheral) insufficiency    Unspecified vitamin D  deficiency     Assessment: Patient Reported Symptoms:  Cognitive Cognitive Status: Alert and oriented to person, place, and time, Insightful and able to interpret abstract concepts, Normal speech and language skills Cognitive/Intellectual Conditions Management [RPT]: None reported or documented in medical history or problem list   Health Maintenance Behaviors: Annual physical exam, Exercise, Social activities, Spiritual practice(s) Healing Pattern: Average Health Facilitated by: Prayer/meditation, Rest, Stress management  Neurological Neurological Review of Symptoms: No symptoms  reported    HEENT HEENT Symptoms Reported: Change or loss of hearing (at times cannot understand if a person is talking)      Cardiovascular Cardiovascular Symptoms Reported: No symptoms reported Cardiovascular Comment: under cardiologist care-Dr. Guss  Respiratory Respiratory Symptoms Reported: No symptoms reported    Endocrine Endocrine Symptoms Reported: No symptoms reported Is patient diabetic?: Yes Is patient checking blood sugars at home?: Yes List most recent blood sugar readings, include date and time of day: yesterday 122 fasting Endocrine Self-Management Outcome: 4 (good)  Gastrointestinal Gastrointestinal Symptoms Reported: No symptoms reported      Genitourinary Genitourinary Symptoms Reported: No symptoms reported    Integumentary Integumentary Symptoms Reported: No symptoms reported    Musculoskeletal Musculoskelatal Symptoms Reviewed: Back pain Additional Musculoskeletal Details: lower back pain at times Musculoskeletal Management Strategies: Routine screening (heating pad, tiger balm) Falls in the past year?: No    Psychosocial       Quality of Family Relationships: supportive    03/28/2024    PHQ2-9 Depression Screening   Little interest or pleasure in doing things Not at all  Feeling down, depressed, or hopeless Several days  PHQ-2 - Total Score 1  Trouble falling or staying asleep, or sleeping too much    Feeling tired or having little energy    Poor appetite or overeating     Feeling bad about yourself - or that you are a failure or have let yourself or your family down    Trouble concentrating on things, such as reading the newspaper or watching television    Moving or speaking so slowly that other people could have noticed.  Or  the opposite - being so fidgety or restless that you have been moving around a lot more than usual    Thoughts that you would be better off dead, or hurting yourself in some way    PHQ2-9 Total Score    If you checked off  any problems, how difficult have these problems made it for you to do your work, take care of things at home, or get along with other people    Depression Interventions/Treatment      There were no vitals filed for this visit.    Medications Reviewed Today     Reviewed by Ermalinda Lenn HERO, LCSW (Social Worker) on 03/28/24 at 1029  Med List Status: <None>   Medication Order Taking? Sig Documenting Provider Last Dose Status Informant  ACCU-CHEK GUIDE TEST test strip [id number] Yes SMARTSIG:Strip(s) [provider]  Active   ALPRAZolam  (XANAX ) 0.5 MG tablet [id number] Yes TAKE 1 TABLET BY MOUTH THREE TIMES DAILY AS NEEDED FOR ANXIETY OR SLEEP Joshua Debby CROME, MD  Active   aspirin  81 MG chewable tablet [id number] Yes Chew 81 mg by mouth every morning. [provider]  Active Self  carvedilol  (COREG ) 6.25 MG tablet [id number] Yes Take 1 tablet (6.25 mg total) by mouth 2 (two) times daily. Michele, Sunit, DO  Active   cyanocobalamin  (VITAMIN B12) 1000 MCG/ML injection [id number] Yes INJECT 1 ML INTRAMUSCULARLY  ONCE EVERY MONTH Joshua Debby CROME, MD  Active   empagliflozin  (JARDIANCE ) 10 MG TABS tablet [id number] Yes Take 1 tablet (10 mg total) by mouth in the morning. Tolia, Sunit, DO  Active   levothyroxine  (SYNTHROID ) 75 MCG tablet [id number] Yes Take 1 tablet (75 mcg total) by mouth daily. Shamleffer, Donell Cardinal, MD  Active   magnesium hydroxide (DULCOLAX) 400 MG/5ML suspension [id number] Yes Take 15 mLs by mouth daily as needed for mild constipation. [provider]  Active Self  MAXITROL 3.5-10000-0.1 OINT [id number] Yes Place 1 Application into the left eye 3 (three) times daily. [provider]  Active   mirtazapine  (REMERON ) 7.5 MG tablet [id number] Yes Take 1 tablet (7.5 mg total) by mouth at bedtime. Joshua Debby CROME, MD  Active   Omega-3 Fatty Acids (FISH OIL PO) [id number]  Take 1,400 mg by mouth daily.  Patient not taking: Reported on 03/28/2024   [provider]  Active   omeprazole (PRILOSEC) 40 MG capsule [id number] Yes Take 40 mg by mouth daily. [provider]  Active   Pharmacist Choice Lancets MISC [id number] Yes  [provider]  Active   Polyethyl Glycol-Propyl Glycol (SYSTANE) 0.4-0.3 % SOLN [id number] Yes Place 1 drop into both eyes daily as needed (dry eyes). [provider]  Active Self  rosuvastatin  (CRESTOR ) 10 MG tablet [id number] Yes TAKE 1 TABLET BY MOUTH IN THE  MORNING Joshua Debby CROME, MD  Active   spironolactone  (ALDACTONE ) 25 MG tablet [id number] Yes Take 0.5 tablets (12.5 mg total) by mouth daily. Tolia, Sunit, DO  Active   valsartan (DIOVAN) 40 MG tablet [id number] Yes Take 1 tablet (40 mg total) by mouth 2 (two) times daily. Michele Richardson, DO  Active             Recommendation:   PCP Follow-up Grief Share through local [facility] Grief group-[facility]  Follow Up Plan:   Telephone follow up appointment date/time:  04/22/24  Lenn Ermalinda, LCSW [place]  [facility], [facility] Licensed Clinical Social Worker  Direct Dial: [phone]

## 2024-03-28 NOTE — Patient Instructions (Signed)
 Visit Information  Thank you for taking time to visit with me today. Please don't hesitate to contact me if I can be of assistance to you before our next scheduled appointment.  Our next appointment is by telephone on 04/22/24 at 11:30am Please call the care guide team at [phone] if you need to cancel or reschedule your appointment.   Following is a copy of your care plan:   Goals Addressed             This Visit's Progress    VBCI Social Work Care Plan       Problems:   Anxiety/Stress  CSW Clinical Goal(s):   Over the next 90 days the Patient will demonstrate a reduction in symptoms related to anxiety and stress .evidenced by patient report                Over the next 30 days patient will increase physical activity by attending work out class at the [facility] 1x per week  Interventions:  Mental Health:  Evaluation of current treatment plan related to anxiety and stress related to loss of family members Grief response normalized-discussed plan to address the holidays Active listening / Reflection utilized Emotional Support Provided Motivational Interviewing employed Participation in support group encouraged : discussed attending grief share on 11/18 [facility] as well as Physicist, Medical grief support  PHQ2/PHQ9 completed GAD 7 completed Solution-Focused Strategies employed: increasing socialization-attending support groups, exercise classes, activities at the senior center, spending time with family Suicidal Ideation/Homicidal Ideation assessed: patient denied thoughts of harm to self or others  Patient Goals/Self-Care Activities:  Continue to take medications as prescribed              Attend grief share group on 04/01/24 Plan:   Telephone follow up appointment with care management team member scheduled for:  04/22/24 11:30am        Please call the Suicide and Crisis Lifeline: 988 if you are experiencing a Mental Health or Behavioral Health Crisis or need  someone to talk to.  Patient verbalized understanding of Care plan and visit instructions communicated this visit  Caeli Linehan, LCSW [place]  [facility], [facility] Licensed Clinical Social Worker  Direct Dial: [phone]

## 2024-03-31 ENCOUNTER — Other Ambulatory Visit

## 2024-03-31 LAB — TSH: TSH: 1.55 m[IU]/L (ref 0.40–4.50)

## 2024-03-31 LAB — T4, FREE: Free T4: 1.4 ng/dL (ref 0.8–1.8)

## 2024-04-16 ENCOUNTER — Ambulatory Visit: Admission: EM | Admit: 2024-04-16 | Discharge: 2024-04-16 | Disposition: A | Discharge status: Home / Self Care

## 2024-04-16 ENCOUNTER — Encounter: Payer: Self-pay | Admitting: Emergency Medicine

## 2024-04-16 ENCOUNTER — Ambulatory Visit

## 2024-04-16 DIAGNOSIS — W1849XA Other slipping, tripping and stumbling without falling, initial encounter: Secondary | ICD-10-CM

## 2024-04-16 DIAGNOSIS — S5011XA Contusion of right forearm, initial encounter: Secondary | ICD-10-CM | POA: Diagnosis not present

## 2024-04-16 DIAGNOSIS — M25562 Pain in left knee: Secondary | ICD-10-CM | POA: Diagnosis not present

## 2024-04-16 MED ORDER — IBUPROFEN 100 MG/5ML PO SUSP
800.0000 mg | Freq: Once | ORAL | Status: AC
  Administered 2024-04-16: 800 mg via ORAL

## 2024-04-16 NOTE — Discharge Instructions (Signed)
 You are able to take Tylenol  and ibuprofen with the meds you are taking now. No fracture noted of knee, preliminary overread shows no fracture of right forearm.    Today you have been diagnosed with a musculoskeletal injury.  Adults may use 400 mg of ibuprofen and 1000 mg of Tylenol  together every 8 hours as needed for pain control.  Children may take ibuprofen and Tylenol  as directed on medication packaging.  You should use ice on affected area for 20 minutes at a time a couple times a day for the first 24 hours then you may switch to heat in the same intervals.  Be sure to put a barrier between ice or heat source and skin to prevent burns.  May also wrap affected area and Ace bandage if tolerated and appropriate, and elevate above the level of the heart to help reduce swelling.  Do not wrap Ace bandages around neck or torso as wrapping too tight can restrict air movement inability to breathe.  If symptoms do not seem to be improving in 3 to 5 days after following these instructions we need to follow-up with orthopedist or PCP.

## 2024-04-16 NOTE — ED Provider Notes (Signed)
 [facility]    CSN: [id number] Arrival date & time: 04/16/24  1455      History   Chief Complaint Chief Complaint  Patient presents with   Knee Injury    L knee     HPI Katie [name] is a 78 y.o. female.   Pt presents today due to fall around 2pm after putting trash in dumpster. Pt states that she turned around she tripped and fell onto left knee. Pt did not hit her head but is fuzzy on details because everything happened so fast. Pt states that at rest she is experiencing 0/10 pain and with weight bearing she is experiencing 8-9/10. Pt has unsteady gait due to pain in left knee. Pt also states that she noticed a hematoma of right volar wrist while waiting.   The history is provided by the patient.    Past Medical History:  Diagnosis Date   Acute venous embolism and thrombosis of unspecified deep vessels of lower extremity 2001   RLE DVT and PE, coumadin x 52mo, now ASSA   Allergy    Anxiety state, unspecified    Blood transfusion without reported diagnosis    Cataract    Chronic idiopathic constipation 11/02/2016   Clotting disorder    Degeneration of cervical intervertebral disc    Gallstones    History of DVT (deep vein thrombosis)    Iron deficiency anemia secondary to blood loss (chronic)    Irritable bowel syndrome    Lumbago    Other B-complex deficiencies    Personal history of colonic polyps    adenomatous   Pure hypercholesterolemia    Reflux esophagitis    Tubular adenoma of colon 09/2002   Type II or unspecified type diabetes mellitus without mention of complication, not stated as uncontrolled    Unspecified essential hypertension    Unspecified hypothyroidism    Unspecified iridocyclitis    Unspecified venous (peripheral) insufficiency    Unspecified vitamin D  deficiency     Patient Active Problem List   Diagnosis Date Noted   Atherosclerosis of artery of both lower extremities 03/06/2024   Right lumbar radiculopathy 11/30/2023   Type  2 diabetes mellitus with stage 3a chronic kidney disease, without long-term current use of insulin (HCC) 09/21/2023   Type 2 diabetes mellitus with diabetic polyneuropathy, without long-term current use of insulin (HCC) 09/21/2023   Diuretic-induced hypokalemia 12/16/2020   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 12/13/2020   MGUS (monoclonal gammopathy of unknown significance) 09/08/2020   Visit for screening mammogram 05/26/2020   Encounter for general adult medical examination with abnormal findings 05/24/2020   Chronic laryngitis 05/24/2020   Intestinal metaplasia of gastric mucosa 09/16/2019   Calculus of gallbladder with chronic cholecystitis without obstruction 02/06/2018   Chronic renal disease, stage 3, moderately decreased glomerular filtration rate (GFR) between 30-59 mL/min/1.73 square meter (HCC) 01/24/2018   Primary osteoarthritis of right hip 07/31/2017   Obesity, Class I, BMI 30.0-34.9 (see actual BMI) 09/20/2016   Spinal stenosis, lumbar region with neurogenic claudication 06/28/2016   Vitamin D  deficiency 03/05/2008   Hyperlipidemia with target LDL less than 100 11/16/2007   Hypothyroidism 04/03/2007   B12 deficiency 04/03/2007   GAD (generalized anxiety disorder) 04/03/2007   PSEUDOTUMOR CEREBRI 04/03/2007   IRITIS 04/03/2007   Essential hypertension 04/03/2007   Reflux esophagitis 04/03/2007   Constipation 04/03/2007   DEGENERATIVE DISC DISEASE, CERVICAL SPINE 04/03/2007    Past Surgical History:  Procedure Laterality Date  BIOPSY  04/24/2018   Procedure: BIOPSY;  Surgeon: Wilhelmenia Aloha Raddle., MD;  Location: [facility];  Service: Gastroenterology;;   ORIN MEDIATE RELEASE     CATARACT EXTRACTION Bilateral 08/29/2017,09/19/17   ESOPHAGOGASTRODUODENOSCOPY (EGD) WITH PROPOFOL  N/A 04/24/2018   Procedure: ESOPHAGOGASTRODUODENOSCOPY (EGD) WITH PROPOFOL ;  Surgeon: Wilhelmenia Aloha Raddle., MD;  Location: [facility];  Service:  Gastroenterology;  Laterality: N/A;  with EMR   FEMORAL HERNIA REPAIR     OOPHORECTOMY     POLYPECTOMY  04/24/2018   Procedure: POLYPECTOMY;  Surgeon: Mansouraty, Aloha Raddle., MD;  Location: [facility];  Service: Gastroenterology;;   UPPER ESOPHAGEAL ENDOSCOPIC ULTRASOUND (EUS)  04/24/2018   Procedure: UPPER ESOPHAGEAL ENDOSCOPIC ULTRASOUND (EUS);  Surgeon: Wilhelmenia Aloha Raddle., MD;  Location: [facility];  Service: Gastroenterology;;   VESICOVAGINAL FISTULA CLOSURE W/ TAH      OB History   No obstetric history on file.      Home Medications    Prior to Admission medications   Medication Sig Start Date End Date Taking? Authorizing Provider  Lancet Devices (SIMPLE DIAGNOSTICS LANCING DEV) MISC Apply topically. 03/31/24  Yes [provider]  ACCU-CHEK GUIDE TEST test strip SMARTSIG:Strip(s) 06/14/23   [provider]  ALPRAZolam  (XANAX ) 0.5 MG tablet TAKE 1 TABLET BY MOUTH THREE TIMES DAILY AS NEEDED FOR ANXIETY OR SLEEP 03/06/24   Joshua Debby CROME, MD  aspirin  81 MG chewable tablet Chew 81 mg by mouth every morning.    [provider]  carvedilol  (COREG ) 6.25 MG tablet Take 1 tablet (6.25 mg total) by mouth 2 (two) times daily. 12/20/23   Tolia, Sunit, DO  cyanocobalamin  (VITAMIN B12) 1000 MCG/ML injection INJECT 1 ML INTRAMUSCULARLY  ONCE EVERY MONTH 03/04/24   Joshua Debby CROME, MD  empagliflozin  (JARDIANCE ) 10 MG TABS tablet Take 1 tablet (10 mg total) by mouth in the morning. 06/04/23   Tolia, Sunit, DO  levothyroxine  (SYNTHROID ) 75 MCG tablet Take 1 tablet (75 mcg total) by mouth daily. 01/31/24   Shamleffer, Ibtehal Jaralla, MD  magnesium hydroxide (DULCOLAX) 400 MG/5ML suspension Take 15 mLs by mouth daily as needed for mild constipation.    [provider]  MAXITROL 3.5-10000-0.1 OINT Place 1 Application into the left eye 3 (three) times daily. 08/27/23   [provider]  mirtazapine  (REMERON ) 7.5 MG tablet Take 1 tablet (7.5 mg total) by  mouth at bedtime. 03/06/24   Joshua Debby CROME, MD  Omega-3 Fatty Acids (FISH OIL PO) Take 1,400 mg by mouth daily. Patient not taking: Reported on 03/28/2024    [provider]  omeprazole (PRILOSEC) 40 MG capsule Take 40 mg by mouth daily.    [provider]  Pharmacist Choice Lancets MISC  06/14/23   [provider]  Polyethyl Glycol-Propyl Glycol (SYSTANE) 0.4-0.3 % SOLN Place 1 drop into both eyes daily as needed (dry eyes).    [provider]  rosuvastatin  (CRESTOR ) 10 MG tablet TAKE 1 TABLET BY MOUTH IN THE  MORNING 08/06/23   Joshua Debby CROME, MD  spironolactone  (ALDACTONE ) 25 MG tablet Take 0.5 tablets (12.5 mg total) by mouth daily. 03/07/24 06/05/24  Tolia, Sunit, DO  valsartan  (DIOVAN ) 40 MG tablet Take 1 tablet (40 mg total) by mouth 2 (two) times daily. 02/12/24   Michele Richardson, DO    Family History Family History  Problem Relation Age of Onset   Prostate cancer Father    Diabetes Mother        retinopathy/blind   Deep vein thrombosis Mother  Hypertension Mother    Healthy Sister    Colon cancer Neg Hx    Esophageal cancer Neg Hx    Stomach cancer Neg Hx    Rectal cancer Neg Hx     Social History Social History   Tobacco Use   Smoking status: Never    Passive exposure: Never   Smokeless tobacco: Never  Vaping Use   Vaping status: Never Used  Substance Use Topics   Alcohol use: Yes    Alcohol/week: 0.0 standard drinks of alcohol    Comment: occasional-wine    Drug use: No     Allergies   Lisinopril , Metoprolol succinate, Penicillins, Sulfamethoxazole, Sulfamethoxazole-trimethoprim, Trimethoprim, and Metformin   Review of Systems Review of Systems   Physical Exam Triage Vital Signs ED Triage Vitals  Encounter Vitals Group     BP 04/16/24 1627 137/79     Girls Systolic BP Percentile --      Girls Diastolic BP Percentile --      Boys Systolic BP Percentile --      Boys Diastolic BP Percentile --      Pulse Rate  04/16/24 1627 86     Resp 04/16/24 1627 18     Temp 04/16/24 1627 98.4 F (36.9 C)     Temp Source 04/16/24 1627 Oral     SpO2 04/16/24 1627 95 %     Weight 04/16/24 1627 144 lb 2.9 oz (65.4 kg)     Height --      Head Circumference --      Peak Flow --      Pain Score 04/16/24 1626 10     Pain Loc --      Pain Education --      Exclude from Growth Chart --    No data found.  Updated Vital Signs BP 137/79 (BP Location: Right Arm)   Pulse 86   Temp 98.4 F (36.9 C) (Oral)   Resp 18   Wt 144 lb 2.9 oz (65.4 kg)   SpO2 95%   BMI 23.99 kg/m   Visual Acuity Right Eye Distance:   Left Eye Distance:   Bilateral Distance:    Right Eye Near:   Left Eye Near:    Bilateral Near:     Physical Exam   UC Treatments / Results  Labs (all labs ordered are listed, but only abnormal results are displayed) Labs Reviewed - No data to display  EKG   Radiology DG Forearm Right Result Date: 04/16/2024 CLINICAL DATA:  fall EXAM: RIGHT FOREARM - 2 VIEW COMPARISON:  None Available. FINDINGS: Osteopenia.No acute fracture or dislocation. Mild joint space loss of the radiocarpal joint with apparent joint space loss at the base of the thumb. Soft tissues are unremarkable. IMPRESSION: 1. Diffuse osteopenia. No acute fracture or dislocation. 2. Mild osteoarthritis of the wrist and base of the thumb. Electronically Signed   By: Rogelia Myers M.D.   On: 04/16/2024 17:50   DG Knee Complete 4 Views Left Result Date: 04/16/2024 CLINICAL DATA:  Left knee pain after injury. EXAM: LEFT KNEE - COMPLETE 4+ VIEW COMPARISON:  None Available. FINDINGS: No evidence of acute fracture or dislocation. There is a moderate joint effusion, but no lipohemarthrosis. Healed fracture deformity of the proximal fibula. The bones are subjectively under mineralized. Minor osteoarthritis with peripheral spurring. No erosive or bony destructive change. IMPRESSION: 1. No acute fracture or dislocation of the left knee. 2.  Moderate joint effusion. 3. Healed fracture deformity of the proximal  fibula. Electronically Signed   By: Andrea Gasman M.D.   On: 04/16/2024 17:05    Procedures Procedures (including critical care time)  Medications Ordered in UC Medications  ibuprofen (ADVIL) 100 MG/5ML suspension 800 mg (800 mg Oral Given 04/16/24 1725)    Initial Impression / Assessment and Plan / UC Course  I have reviewed the triage vital signs and the nursing notes.  Pertinent labs & imaging results that were available during my care of the patient were reviewed by me and considered in my medical decision making (see chart for details).    Final Clinical Impressions(s) / UC Diagnoses   Final diagnoses:  Other slipping, tripping and stumbling without falling, initial encounter  Hematoma of right forearm     Discharge Instructions      You are able to take Tylenol  and ibuprofen with the meds you are taking now. No fracture noted of knee, preliminary overread shows no fracture of right forearm.    Today you have been diagnosed with a musculoskeletal injury.  Adults may use 400 mg of ibuprofen and 1000 mg of Tylenol  together every 8 hours as needed for pain control.  Children may take ibuprofen and Tylenol  as directed on medication packaging.  You should use ice on affected area for 20 minutes at a time a couple times a day for the first 24 hours then you may switch to heat in the same intervals.  Be sure to put a barrier between ice or heat source and skin to prevent burns.  May also wrap affected area and Ace bandage if tolerated and appropriate, and elevate above the level of the heart to help reduce swelling.  Do not wrap Ace bandages around neck or torso as wrapping too tight can restrict air movement inability to breathe.  If symptoms do not seem to be improving in 3 to 5 days after following these instructions we need to follow-up with orthopedist or PCP.      ED Prescriptions   None    PDMP not  reviewed this encounter.   Andra Corean BROCKS, PA-C 04/16/24 1806

## 2024-04-16 NOTE — ED Triage Notes (Signed)
 Pt presents c/o knee injury and pain x today . Pt states,  I was at the dumpster and picked up a bag of trash. I must have lost my balance after I threw the bag in the dumpster. I fell on my left knee and now it hurts from my knee cap down.  Pt denies LOC

## 2024-04-22 ENCOUNTER — Other Ambulatory Visit: Payer: Self-pay | Admitting: *Deleted

## 2024-04-23 ENCOUNTER — Other Ambulatory Visit: Payer: Self-pay | Admitting: Cardiology

## 2024-04-23 DIAGNOSIS — I429 Cardiomyopathy, unspecified: Secondary | ICD-10-CM

## 2024-04-23 DIAGNOSIS — I5042 Chronic combined systolic (congestive) and diastolic (congestive) heart failure: Secondary | ICD-10-CM

## 2024-04-23 NOTE — Telephone Encounter (Signed)
 The patient called and said she is going out of town and is about to be out. Thank you

## 2024-04-23 NOTE — Patient Instructions (Signed)
 Visit Information  Thank you for taking time to visit with me today. Please don't hesitate to contact me if I can be of assistance to you before our next scheduled appointment.  Your next care management appointment is no further scheduled appointments.    Closing From: Complex Care Management.  Please call the care guide team at [phone] if you need to cancel, schedule, or reschedule an appointment.   Please call the Suicide and Crisis Lifeline: 988 call the [place]  National Suicide Prevention Lifeline: 408-225-0077 or TTY: 573-226-6272 TTY (226)704-1283) to talk to a trained counselor call 1-800-273-TALK (toll free, 24 hour hotline) call 911 if you are experiencing a Mental Health or Behavioral Health Crisis or need someone to talk to.  Bindi Klomp, LCSW [place]  [facility], [facility] Licensed Clinical Social Worker  Direct Dial: [phone]

## 2024-04-23 NOTE — Patient Outreach (Addendum)
 Complex Care Management   Visit Note  04/23/2024  Name:  Katie [name] MRN: [id number] DOB: 08/17/1945  Situation: Referral received for Complex Care Management related to Mental/Behavioral Health diagnosis grief I obtained verbal consent from Patient.  Visit completed with Patient  on the phone  Background:   Past Medical History:  Diagnosis Date   Acute venous embolism and thrombosis of unspecified deep vessels of lower extremity 2001   RLE DVT and PE, coumadin x 36mo, now ASSA   Allergy    Anxiety state, unspecified    Blood transfusion without reported diagnosis    Cataract    Chronic idiopathic constipation 11/02/2016   Clotting disorder    Degeneration of cervical intervertebral disc    Gallstones    History of DVT (deep vein thrombosis)    Iron deficiency anemia secondary to blood loss (chronic)    Irritable bowel syndrome    Lumbago    Other B-complex deficiencies    Personal history of colonic polyps    adenomatous   Pure hypercholesterolemia    Reflux esophagitis    Tubular adenoma of colon 09/2002   Type II or unspecified type diabetes mellitus without mention of complication, not stated as uncontrolled    Unspecified essential hypertension    Unspecified hypothyroidism    Unspecified iridocyclitis    Unspecified venous (peripheral) insufficiency    Unspecified vitamin D  deficiency     Assessment: Patient Reported Symptoms:  Cognitive Cognitive Status: Alert and oriented to person, place, and time, Insightful and able to interpret abstract concepts, Normal speech and language skills Cognitive/Intellectual Conditions Management [RPT]: None reported or documented in medical history or problem list   Health Maintenance Behaviors: Annual physical exam, Spiritual practice(s) Healing Pattern: Average Health Facilitated by: Prayer/meditation, Stress management  Neurological Neurological Review of Symptoms: No symptoms reported    HEENT HEENT Symptoms Reported: No  symptoms reported      Cardiovascular Cardiovascular Symptoms Reported: No symptoms reported    Respiratory Respiratory Symptoms Reported: No symptoms reported    Endocrine Endocrine Symptoms Reported: No symptoms reported Is patient diabetic?: Yes Is patient checking blood sugars at home?: Yes List most recent blood sugar readings, include date and time of day: yesteray 118 fasting in morning    Gastrointestinal Gastrointestinal Symptoms Reported: No symptoms reported      Genitourinary Genitourinary Symptoms Reported: No symptoms reported    Integumentary Integumentary Symptoms Reported: No symptoms reported    Musculoskeletal Additional Musculoskeletal Details: Patient had a fall on 04/16/24-was seen at urgent care bruised right risk and left leg no pain at this time-goiven a brace to wear  uses ice  pack and heat Musculoskeletal Management Strategies: Medication therapy Musculoskeletal Self-Management Outcome: 4 (good) Musculoskeletal Comment: appointment with orthopdedic doctor today Falls in the past year?: Yes Number of falls in past year: 1 or less Was there an injury with Fall?: Yes Fall Risk Category Calculator: 2 Patient Fall Risk Level: Moderate Fall Risk Patient at Risk for Falls Due to: History of fall(s) Fall risk Follow up: Falls prevention discussed  Psychosocial Psychosocial Symptoms Reported: No symptoms reported          04/23/2024    PHQ2-9 Depression Screening   Little interest or pleasure in doing things Not at all  Feeling down, depressed, or hopeless Not at all  PHQ-2 - Total Score 0  Trouble falling or staying asleep, or sleeping too much    Feeling tired or having little energy    Poor appetite or  overeating     Feeling bad about yourself - or that you are a failure or have let yourself or your family down    Trouble concentrating on things, such as reading the newspaper or watching television    Moving or speaking so slowly that other people  could have noticed.  Or the opposite - being so fidgety or restless that you have been moving around a lot more than usual    Thoughts that you would be better off dead, or hurting yourself in some way    PHQ2-9 Total Score    If you checked off any problems, how difficult have these problems made it for you to do your work, take care of things at home, or get along with other people    Depression Interventions/Treatment      There were no vitals filed for this visit.    Medications Reviewed Today     Reviewed by Ermalinda Lenn HERO, LCSW (Social Worker) on 04/22/24 at 1139  Med List Status: <None>   Medication Order Taking? Sig Documenting Provider Last Dose Status Informant  ACCU-CHEK GUIDE TEST test strip [id number] Yes SMARTSIG:Strip(s) [provider]  Active   ALPRAZolam  (XANAX ) 0.5 MG tablet [id number] Yes TAKE 1 TABLET BY MOUTH THREE TIMES DAILY AS NEEDED FOR ANXIETY OR SLEEP Joshua Debby CROME, MD  Active   aspirin  81 MG chewable tablet [id number] Yes Chew 81 mg by mouth every morning. [provider]  Active Self  carvedilol  (COREG ) 6.25 MG tablet [id number] Yes Take 1 tablet (6.25 mg total) by mouth 2 (two) times daily. Michele, Sunit, DO  Active   cyanocobalamin  (VITAMIN B12) 1000 MCG/ML injection [id number] Yes INJECT 1 ML INTRAMUSCULARLY  ONCE EVERY MONTH Joshua Debby CROME, MD  Active   empagliflozin  (JARDIANCE ) 10 MG TABS tablet [id number] Yes Take 1 tablet (10 mg total) by mouth in the morning. Michele Richardson, DO  Active   Lancet Devices (SIMPLE DIAGNOSTICS LANCING DEV) MISC [id number] Yes Apply topically. [provider]  Active   levothyroxine  (SYNTHROID ) 75 MCG tablet [id number] Yes Take 1 tablet (75 mcg total) by mouth daily. Shamleffer, Donell Cardinal, MD  Active   magnesium hydroxide (DULCOLAX) 400 MG/5ML suspension [id number] Yes Take 15 mLs by mouth daily as needed for mild constipation. [provider]  Active Self  MAXITROL 3.5-10000-0.1 OINT  [id number] Yes Place 1 Application into the left eye 3 (three) times daily. [provider]  Active   mirtazapine  (REMERON ) 7.5 MG tablet [id number] Yes Take 1 tablet (7.5 mg total) by mouth at bedtime. Joshua Debby CROME, MD  Active   Omega-3 Fatty Acids (FISH OIL PO) [id number]  Take 1,400 mg by mouth daily.  Patient not taking: Reported on 04/22/2024   [provider]  Active   omeprazole (PRILOSEC) 40 MG capsule [id number] Yes Take 40 mg by mouth daily. [provider]  Active   Pharmacist Choice Lancets MISC [id number] Yes  [provider]  Active   Polyethyl Glycol-Propyl Glycol (SYSTANE) 0.4-0.3 % SOLN [id number] Yes Place 1 drop into both eyes daily as needed (dry eyes). [provider]  Active Self  rosuvastatin  (CRESTOR ) 10 MG tablet [id number] Yes TAKE 1 TABLET BY MOUTH IN THE  MORNING Joshua Debby CROME, MD  Active   spironolactone  (ALDACTONE ) 25 MG tablet [id number] Yes Take 0.5 tablets (12.5 mg total) by mouth daily. Tolia, Sunit, DO  Active   valsartan  (DIOVAN ) 40 MG tablet [id number] Yes Take 1 tablet (40 mg total) by  mouth 2 (two) times daily. Michele Richardson, DO  Active             Recommendation:   PCP Follow-up Specialty provider follow-up Orthopedic appointment 04/22/24  Follow Up Plan:   Closing From:  Complex Care Management  Merilynn Haydu, LCSW [place]  [facility], [facility] Licensed Clinical Social Worker  Direct Dial: [phone]

## 2024-04-24 ENCOUNTER — Other Ambulatory Visit: Payer: Self-pay | Admitting: Cardiology

## 2024-04-24 DIAGNOSIS — I429 Cardiomyopathy, unspecified: Secondary | ICD-10-CM

## 2024-04-24 DIAGNOSIS — I5042 Chronic combined systolic (congestive) and diastolic (congestive) heart failure: Secondary | ICD-10-CM

## 2024-05-05 ENCOUNTER — Telehealth: Payer: Self-pay | Admitting: Cardiology

## 2024-05-05 NOTE — Telephone Encounter (Signed)
 Patient reports her recent BP readings:  12/22 -- 128/93, HR 87 12/21 -- 117/78 12/20 -- 99/66, HR 92 (in the evening) 12/17 -- 98/71  Patient states she was instructed by Dr. Michele to hold carvedilol  if BP <100/60, and this is what she has been doing. She would like to know if this is OK for her to continue doing this.  Patient denies any chest pain, headache, dizziness, or lightheadedness.   Patient has been seeing Pharm D for BP management and is due for follow-up.  Will forward to Dr. Tolia and Pharm D to review.

## 2024-05-05 NOTE — Telephone Encounter (Signed)
 Continue to follow holding parameters.  Will await PharmD input as things may have changed since I last saw her.   Dr. Cristin Szatkowski

## 2024-05-05 NOTE — Telephone Encounter (Signed)
 Pt c/o BP issue: STAT if pt c/o blurred vision, one-sided weakness or slurred speech.  STAT if BP is GREATER than 180/120 TODAY.  STAT if BP is LESS than 90/60 and SYMPTOMATIC TODAY  1. What is your BP concern? BP inconsistent   2. Have you taken any BP medication today? Yes   3. What are your last 5 BP readings? Today: 128/93 117/88 109/73 99/66 98/64   4. Are you having any other symptoms (ex. Dizziness, headache, blurred vision, passed out)? No

## 2024-05-06 NOTE — Telephone Encounter (Signed)
 Spoke with the patient and advised her to check blood pressure before taking antihypertensive medication. If BP is <100/60, continue hold parameters as previously instructed. She denies chest pain, headache, or dizziness. Follow-up with cardiology is scheduled for February 2026. Last seen by Robbi, PharmD in September 2025, with a recommended 4-week follow-up. Offered to schedule with Vaishali, but patient declined due to copay concerns and will wait for February visit to see provider. Instructed her to continue monitoring BP, bring a log to her appointment, and reach out with any questions or concerns and if BP continues to drop below (<100/60).

## 2024-05-07 NOTE — Telephone Encounter (Signed)
Thank-you.   Dr. Odis Hollingshead

## 2024-05-19 ENCOUNTER — Other Ambulatory Visit: Payer: Self-pay | Admitting: Internal Medicine

## 2024-05-19 DIAGNOSIS — E785 Hyperlipidemia, unspecified: Secondary | ICD-10-CM

## 2024-06-12 ENCOUNTER — Telehealth: Payer: Self-pay

## 2024-06-12 NOTE — Telephone Encounter (Signed)
 Copied from CRM [id number]. Topic: Clinical - Prescription Issue >> Jun 12, 2024  3:19 PM Montie POUR wrote: Reason for CRM:  [name] is calling to have Dr. Joshua to send her rosuvastatin  (CRESTOR ) 10 MG tablet to [facility] that is listed in her chart. The medication went to [facility] on 05/21/24 and her insurance will no longer pay for medication through [facility]. Thanks She will be out of medication around 06/20/24.

## 2024-06-13 ENCOUNTER — Other Ambulatory Visit: Payer: Self-pay

## 2024-06-13 DIAGNOSIS — E785 Hyperlipidemia, unspecified: Secondary | ICD-10-CM

## 2024-06-13 MED ORDER — ROSUVASTATIN CALCIUM 10 MG PO TABS: 10.0000 mg | ORAL_TABLET | Freq: Every morning | ORAL | 2 refills | Status: AC

## 2024-06-13 NOTE — Telephone Encounter (Signed)
 This issue has since been resolved. Prescription has been sent to the [facility] pharmacy on file. Patient informed and expressed understanding

## 2024-06-26 ENCOUNTER — Ambulatory Visit (HOSPITAL_COMMUNITY)

## 2024-07-07 ENCOUNTER — Ambulatory Visit: Payer: Self-pay | Admitting: Cardiology

## 2024-07-08 ENCOUNTER — Other Ambulatory Visit: Payer: Medicare Other

## 2024-07-08 ENCOUNTER — Ambulatory Visit: Payer: Medicare Other | Admitting: Hematology and Oncology

## 2024-07-29 ENCOUNTER — Ambulatory Visit: Admitting: Internal Medicine

## 2024-09-04 ENCOUNTER — Ambulatory Visit: Admitting: Internal Medicine

## 2025-03-02 ENCOUNTER — Encounter: Admitting: Internal Medicine

## 2025-03-02 ENCOUNTER — Ambulatory Visit
